# Patient Record
Sex: Male | Born: 1960 | Race: Black or African American | Hispanic: No | Marital: Single | Smoking: Former smoker
Health system: Southern US, Community
[De-identification: ages and names within clinical notes are randomized; demographics above are authoritative.]

## PROBLEM LIST (undated history)

## (undated) DIAGNOSIS — C61 Malignant neoplasm of prostate: Secondary | ICD-10-CM

## (undated) DIAGNOSIS — Z8659 Personal history of other mental and behavioral disorders: Secondary | ICD-10-CM

## (undated) DIAGNOSIS — F32A Depression, unspecified: Secondary | ICD-10-CM

## (undated) DIAGNOSIS — M1990 Unspecified osteoarthritis, unspecified site: Secondary | ICD-10-CM

## (undated) DIAGNOSIS — R351 Nocturia: Secondary | ICD-10-CM

## (undated) DIAGNOSIS — J9600 Acute respiratory failure, unspecified whether with hypoxia or hypercapnia: Secondary | ICD-10-CM

## (undated) DIAGNOSIS — K029 Dental caries, unspecified: Secondary | ICD-10-CM

## (undated) DIAGNOSIS — F329 Major depressive disorder, single episode, unspecified: Secondary | ICD-10-CM

## (undated) DIAGNOSIS — Z87820 Personal history of traumatic brain injury: Secondary | ICD-10-CM

## (undated) DIAGNOSIS — Z9189 Other specified personal risk factors, not elsewhere classified: Secondary | ICD-10-CM

## (undated) DIAGNOSIS — I10 Essential (primary) hypertension: Secondary | ICD-10-CM

## (undated) DIAGNOSIS — N179 Acute kidney failure, unspecified: Secondary | ICD-10-CM

## (undated) DIAGNOSIS — Z973 Presence of spectacles and contact lenses: Secondary | ICD-10-CM

## (undated) DIAGNOSIS — R972 Elevated prostate specific antigen [PSA]: Secondary | ICD-10-CM

## (undated) DIAGNOSIS — N400 Enlarged prostate without lower urinary tract symptoms: Secondary | ICD-10-CM

## (undated) HISTORY — DX: Acute kidney failure, unspecified: N17.9

## (undated) HISTORY — PX: UPPER GASTROINTESTINAL ENDOSCOPY: SHX188

## (undated) HISTORY — PX: APPENDECTOMY: SHX54

## (undated) HISTORY — DX: Acute respiratory failure, unspecified whether with hypoxia or hypercapnia: J96.00

---
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
## 1999-03-02 ENCOUNTER — Emergency Department (HOSPITAL_COMMUNITY): Admission: EM | Admit: 1999-03-02 | Discharge: 1999-03-02 | Payer: Self-pay | Admitting: Emergency Medicine

## 1999-12-14 ENCOUNTER — Emergency Department (HOSPITAL_COMMUNITY): Admission: EM | Admit: 1999-12-14 | Discharge: 1999-12-14 | Payer: Self-pay | Admitting: Emergency Medicine

## 1999-12-14 ENCOUNTER — Encounter: Payer: Self-pay | Admitting: Emergency Medicine

## 2002-02-14 ENCOUNTER — Emergency Department (HOSPITAL_COMMUNITY): Admission: EM | Admit: 2002-02-14 | Discharge: 2002-02-14 | Payer: Self-pay | Admitting: Emergency Medicine

## 2002-02-14 ENCOUNTER — Encounter: Payer: Self-pay | Admitting: Emergency Medicine

## 2006-02-19 ENCOUNTER — Emergency Department (HOSPITAL_COMMUNITY): Admission: EM | Admit: 2006-02-19 | Discharge: 2006-02-19 | Payer: Self-pay | Admitting: Emergency Medicine

## 2008-12-28 ENCOUNTER — Emergency Department (HOSPITAL_COMMUNITY): Admission: EM | Admit: 2008-12-28 | Discharge: 2008-12-28 | Payer: Self-pay | Admitting: Emergency Medicine

## 2009-02-12 ENCOUNTER — Ambulatory Visit: Payer: Self-pay | Admitting: Physician Assistant

## 2009-02-12 DIAGNOSIS — F17200 Nicotine dependence, unspecified, uncomplicated: Secondary | ICD-10-CM | POA: Insufficient documentation

## 2009-02-12 DIAGNOSIS — M545 Low back pain: Secondary | ICD-10-CM

## 2009-02-12 DIAGNOSIS — F1010 Alcohol abuse, uncomplicated: Secondary | ICD-10-CM | POA: Insufficient documentation

## 2009-02-12 DIAGNOSIS — R82998 Other abnormal findings in urine: Secondary | ICD-10-CM | POA: Insufficient documentation

## 2009-02-12 DIAGNOSIS — M1990 Unspecified osteoarthritis, unspecified site: Secondary | ICD-10-CM | POA: Insufficient documentation

## 2009-02-12 DIAGNOSIS — I10 Essential (primary) hypertension: Secondary | ICD-10-CM | POA: Insufficient documentation

## 2009-02-12 LAB — CONVERTED CEMR LAB
Bilirubin Urine: NEGATIVE
Glucose, Urine, Semiquant: NEGATIVE
Ketones, urine, test strip: NEGATIVE
Nitrite: NEGATIVE
Specific Gravity, Urine: 1.025
Urobilinogen, UA: 0.2

## 2009-02-13 ENCOUNTER — Encounter: Payer: Self-pay | Admitting: Physician Assistant

## 2009-02-15 ENCOUNTER — Ambulatory Visit (HOSPITAL_COMMUNITY): Admission: RE | Admit: 2009-02-15 | Discharge: 2009-02-15 | Payer: Self-pay | Admitting: Physician Assistant

## 2009-02-15 ENCOUNTER — Encounter: Payer: Self-pay | Admitting: Physician Assistant

## 2009-02-15 DIAGNOSIS — F1410 Cocaine abuse, uncomplicated: Secondary | ICD-10-CM | POA: Insufficient documentation

## 2009-02-15 LAB — CONVERTED CEMR LAB
ALT: 24 units/L (ref 0–53)
Alkaline Phosphatase: 37 units/L — ABNORMAL LOW (ref 39–117)
Amphetamine Screen, Ur: NEGATIVE
BUN: 11 mg/dL (ref 6–23)
Bacteria, UA: NONE SEEN
Basophils Absolute: 0 10*3/uL (ref 0.0–0.1)
Benzodiazepines.: NEGATIVE
Calcium: 10.3 mg/dL (ref 8.4–10.5)
Casts: NONE SEEN /lpf
Cocaine Metabolites: POSITIVE — AB
Creatinine, Ser: 1.19 mg/dL (ref 0.40–1.50)
Creatinine,U: 204 mg/dL
Glucose, Bld: 84 mg/dL (ref 70–99)
Methadone: NEGATIVE
Monocytes Absolute: 0.7 10*3/uL (ref 0.1–1.0)
Opiate Screen, Urine: NEGATIVE
Phencyclidine (PCP): NEGATIVE
Platelets: 224 10*3/uL (ref 150–400)
RBC / HPF: NONE SEEN (ref <3)
Sodium: 144 meq/L (ref 135–145)
Total Bilirubin: 0.6 mg/dL (ref 0.3–1.2)
Total Protein: 7.6 g/dL (ref 6.0–8.3)
WBC, UA: NONE SEEN cells/hpf (ref <3)
WBC: 5.4 10*3/uL (ref 4.0–10.5)

## 2009-02-26 ENCOUNTER — Encounter: Payer: Self-pay | Admitting: Physician Assistant

## 2009-03-01 ENCOUNTER — Ambulatory Visit: Payer: Self-pay | Admitting: Physician Assistant

## 2009-03-09 ENCOUNTER — Encounter: Payer: Self-pay | Admitting: Physician Assistant

## 2009-03-09 ENCOUNTER — Encounter: Admission: RE | Admit: 2009-03-09 | Discharge: 2009-06-07 | Payer: Self-pay | Admitting: Physician Assistant

## 2009-03-14 ENCOUNTER — Encounter: Payer: Self-pay | Admitting: Physician Assistant

## 2009-03-14 ENCOUNTER — Telehealth: Payer: Self-pay | Admitting: Physician Assistant

## 2009-03-24 ENCOUNTER — Ambulatory Visit: Payer: Self-pay | Admitting: Physician Assistant

## 2009-03-24 DIAGNOSIS — F329 Major depressive disorder, single episode, unspecified: Secondary | ICD-10-CM | POA: Insufficient documentation

## 2009-03-24 DIAGNOSIS — F3289 Other specified depressive episodes: Secondary | ICD-10-CM | POA: Insufficient documentation

## 2009-03-24 LAB — CONVERTED CEMR LAB
BUN: 15 mg/dL (ref 6–23)
CO2: 26 meq/L (ref 19–32)
Potassium: 5.1 meq/L (ref 3.5–5.3)
Sodium: 141 meq/L (ref 135–145)

## 2009-03-25 ENCOUNTER — Encounter: Payer: Self-pay | Admitting: Physician Assistant

## 2009-04-08 ENCOUNTER — Encounter: Payer: Self-pay | Admitting: Physician Assistant

## 2009-04-16 ENCOUNTER — Telehealth: Payer: Self-pay | Admitting: Physician Assistant

## 2009-04-16 ENCOUNTER — Ambulatory Visit: Payer: Self-pay | Admitting: Physician Assistant

## 2009-04-19 ENCOUNTER — Encounter: Payer: Self-pay | Admitting: Physician Assistant

## 2009-04-19 DIAGNOSIS — E781 Pure hyperglyceridemia: Secondary | ICD-10-CM | POA: Insufficient documentation

## 2009-04-19 LAB — CONVERTED CEMR LAB
BUN: 13 mg/dL (ref 6–23)
HDL goal, serum: 40 mg/dL
LDL Cholesterol: 44 mg/dL (ref 0–99)
Sodium: 138 meq/L (ref 135–145)
Total CHOL/HDL Ratio: 3.9
Triglycerides: 335 mg/dL — ABNORMAL HIGH (ref <150)

## 2009-04-29 ENCOUNTER — Ambulatory Visit: Payer: Self-pay | Admitting: Physician Assistant

## 2009-05-04 ENCOUNTER — Encounter: Payer: Self-pay | Admitting: Physician Assistant

## 2009-05-21 ENCOUNTER — Telehealth: Payer: Self-pay | Admitting: Physician Assistant

## 2009-05-21 ENCOUNTER — Ambulatory Visit: Payer: Self-pay | Admitting: Physician Assistant

## 2009-05-21 ENCOUNTER — Encounter: Payer: Self-pay | Admitting: Physician Assistant

## 2009-05-21 DIAGNOSIS — K047 Periapical abscess without sinus: Secondary | ICD-10-CM

## 2009-05-24 ENCOUNTER — Encounter: Payer: Self-pay | Admitting: Physician Assistant

## 2009-05-26 ENCOUNTER — Ambulatory Visit (HOSPITAL_COMMUNITY): Admission: RE | Admit: 2009-05-26 | Discharge: 2009-05-26 | Payer: Self-pay | Admitting: Internal Medicine

## 2009-06-01 ENCOUNTER — Telehealth (INDEPENDENT_AMBULATORY_CARE_PROVIDER_SITE_OTHER): Payer: Self-pay | Admitting: *Deleted

## 2009-06-09 ENCOUNTER — Encounter: Admission: RE | Admit: 2009-06-09 | Discharge: 2009-06-09 | Payer: Self-pay | Admitting: Internal Medicine

## 2009-07-02 ENCOUNTER — Encounter: Payer: Self-pay | Admitting: Physician Assistant

## 2009-07-27 ENCOUNTER — Ambulatory Visit: Payer: Self-pay | Admitting: Physician Assistant

## 2009-08-17 ENCOUNTER — Ambulatory Visit: Payer: Self-pay | Admitting: Physician Assistant

## 2009-08-17 LAB — CONVERTED CEMR LAB
BUN: 13 mg/dL (ref 6–23)
Chloride: 104 meq/L (ref 96–112)

## 2009-08-20 ENCOUNTER — Encounter: Payer: Self-pay | Admitting: Physician Assistant

## 2009-09-06 ENCOUNTER — Telehealth: Payer: Self-pay | Admitting: Physician Assistant

## 2009-09-07 ENCOUNTER — Ambulatory Visit: Payer: Self-pay | Admitting: Physician Assistant

## 2009-09-07 DIAGNOSIS — L723 Sebaceous cyst: Secondary | ICD-10-CM

## 2009-09-07 DIAGNOSIS — R599 Enlarged lymph nodes, unspecified: Secondary | ICD-10-CM | POA: Insufficient documentation

## 2009-09-09 ENCOUNTER — Encounter: Payer: Self-pay | Admitting: Physician Assistant

## 2009-09-14 ENCOUNTER — Encounter: Admission: RE | Admit: 2009-09-14 | Discharge: 2009-09-14 | Payer: Self-pay | Admitting: Internal Medicine

## 2009-09-14 ENCOUNTER — Encounter: Payer: Self-pay | Admitting: Physician Assistant

## 2009-09-17 ENCOUNTER — Encounter: Payer: Self-pay | Admitting: Physician Assistant

## 2009-09-21 ENCOUNTER — Telehealth: Payer: Self-pay | Admitting: Physician Assistant

## 2009-09-21 ENCOUNTER — Ambulatory Visit: Payer: Self-pay | Admitting: Physician Assistant

## 2009-09-21 LAB — CONVERTED CEMR LAB
ALT: 32 units/L (ref 0–53)
Albumin: 4.7 g/dL (ref 3.5–5.2)
Alkaline Phosphatase: 32 units/L — ABNORMAL LOW (ref 39–117)
BUN: 12 mg/dL (ref 6–23)
Calcium: 10.2 mg/dL (ref 8.4–10.5)
Creatinine, Ser: 1.1 mg/dL (ref 0.40–1.50)
Indirect Bilirubin: 0.7 mg/dL (ref 0.0–0.9)
LDL Cholesterol: 112 mg/dL — ABNORMAL HIGH (ref 0–99)
Potassium: 5.3 meq/L (ref 3.5–5.3)
Total CHOL/HDL Ratio: 3.9
Total Protein: 7.7 g/dL (ref 6.0–8.3)
Triglycerides: 139 mg/dL (ref <150)

## 2009-09-22 ENCOUNTER — Encounter: Payer: Self-pay | Admitting: Physician Assistant

## 2009-11-05 ENCOUNTER — Encounter: Payer: Self-pay | Admitting: Physician Assistant

## 2010-01-11 ENCOUNTER — Ambulatory Visit: Admission: RE | Admit: 2010-01-11 | Payer: Self-pay | Source: Home / Self Care | Admitting: Physician Assistant

## 2010-01-11 ENCOUNTER — Encounter (INDEPENDENT_AMBULATORY_CARE_PROVIDER_SITE_OTHER): Payer: Self-pay | Admitting: Nurse Practitioner

## 2010-01-12 ENCOUNTER — Emergency Department (HOSPITAL_COMMUNITY)
Admission: EM | Admit: 2010-01-12 | Discharge: 2010-01-12 | Payer: Self-pay | Source: Home / Self Care | Admitting: Emergency Medicine

## 2010-01-28 ENCOUNTER — Encounter (INDEPENDENT_AMBULATORY_CARE_PROVIDER_SITE_OTHER): Payer: Self-pay | Admitting: Nurse Practitioner

## 2010-01-29 ENCOUNTER — Encounter: Payer: Self-pay | Admitting: Internal Medicine

## 2010-02-10 NOTE — Progress Notes (Signed)
Summary: Dental Referral  Phone Note Outgoing Call   Summary of Call: Needs dental clinic referral.  On antibxs and pain meds. Initial call taken by: Tereso Newcomer PA-C,  May 21, 2009 11:05 AM

## 2010-02-10 NOTE — Letter (Signed)
Summary: REFERAL//PSYCHOLOGY //APPT DATE & TIME  REFERAL//PSYCHOLOGY //APPT DATE & TIME   Imported By: Arta Bruce 05/26/2009 14:58:24  _____________________________________________________________________  External Attachment:    Type:   Image     Comment:   External Document

## 2010-02-10 NOTE — Assessment & Plan Note (Signed)
Summary: 6 WEEK FU ON BACK PAIN AND KNEE/////KT   Vital Signs:  Patient profile:   50 year old male Weight:      179.8 pounds BMI:     26.46 BSA:     1.98 Temp:     98.0 degrees F oral Pulse rate:   60 / minute Pulse rhythm:   regular Resp:     16 per minute BP sitting:   119 / 76  (left arm) Cuff size:   large  Vitals Entered By: Levon Hedger (May 21, 2009 9:44 AM) CC: follow-up visit for knee and back Is Patient Diabetic? No Pain Assessment Patient in pain? yes     Location: back Intensity: 5-6 Onset of pain  Intermittent  Does patient need assistance? Functional Status Self care Ambulation Normal   Primary Care Provider:  Tereso Newcomer PA-C  CC:  follow-up visit for knee and back.  History of Present Illness: Here for f/u on back and knee.  Needs letter to help with child support.  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  Does not know how to do anything.  Started back to working at [employer], etc.  No injury.  Pain in back worsened at that time.  Has been worse since.  Cannot find employment now.  Back pain:  Lumbar area.  Lifting and standing makes worse.  Changes in weather makes worse.  Sitting up straight helps.  Went through PT.  Had some improvement.  Still doing HEP.  Feels pain and numbness into his buttock on the left.  Naproxen helps.  Out of Tramadol now.  Had Rx from prior doctor for hydrocodone.  Got it filled recently for tooth pain.  Dental pain:  Started several weeks ago.  Has been taking hydrocodone.  No fevers.  Has had facial swelling.    Drug abuse:  Getting into drug tx program.  Spoke to A. Vaughan this am.  He states he has stopped using cocaine.  Reducing his alcohol intake.  Still smoking THC.  Has signed forms to get records released to treatment program.  Depression:  States he had thoughts of suicide several weeks ago.  Feels better since he got into tx for drugs.  States AAnanias Pilgrim  wanted to talk with me.  No suicidal plans at this time.   Allergies: 1)  ! Asa  Physical Exam  General:  alert, well-developed, and well-nourished.   Head:  normocephalic and atraumatic.   Neck:  supple.   Lungs:  normal breath sounds.   Heart:  normal rate and regular rhythm.   Msk:  neg SLR no spinal tend to palp  Neurologic:  alert & oriented X3 and cranial nerves II-XII intact.   Psych:  normally interactive and not suicidal.     Impression & Recommendations:  Problem # 1:  ABSCESS, TOOTH (ICD-522.5) start antibxs refer to dental clinic  Orders: Dental Referral (Dentist)  Problem # 2:  BACK PAIN, LUMBAR (ICD-724.2)  get MRI considered prednisone taper but with depression will hold off on this due to SEs change to diclofenac will stop tramadol in anticipation of using SSRI will need to do letter for him for child support   His updated medication list for this problem includes:    Naprosyn 500 Mg Tabs (Naproxen) .Marland Kitchen... Take 1 tablet by mouth two times a day as needed for pain    Tramadol Hcl 50 Mg Tabs (Tramadol hcl) .Marland Kitchen... Take 1  by mouth up three times a day as needed for pain    Hydrocodone-acetaminophen 5-500 Mg Tabs (Hydrocodone-acetaminophen) .Marland Kitchen... Take one and a half tablet by mouth twice daily as needed for pain *sami hassan,md    Diclofenac Sodium 75 Mg Tbec (Diclofenac sodium) .Marland Kitchen... Take 1 tablet by mouth two times a day as needed for pain  Orders: MRI (MRI)  Problem # 3:  DEPRESSION (ICD-311)  will d/w LCSW will try to start on SSRI  d/w A. Vaughan I have decided to try him on Celexa will arrange close f/u  His updated medication list for this problem includes:    Celexa 10 Mg Tabs (Citalopram hydrobromide) .Marland Kitchen... Take 1 tablet by mouth once a day  Problem # 4:  OSTEOARTHRITIS (ICD-715.90) knee pain likely 2/2 back pain will focus on back first  His updated medication list for this problem includes:    Naprosyn 500 Mg Tabs (Naproxen) .Marland Kitchen...  Take 1 tablet by mouth two times a day as needed for pain    Tramadol Hcl 50 Mg Tabs (Tramadol hcl) .Marland Kitchen... Take 1 by mouth up three times a day as needed for pain    Hydrocodone-acetaminophen 5-500 Mg Tabs (Hydrocodone-acetaminophen) .Marland Kitchen... Take one and a half tablet by mouth twice daily as needed for pain *sami hassan,md    Diclofenac Sodium 75 Mg Tbec (Diclofenac sodium) .Marland Kitchen... Take 1 tablet by mouth two times a day as needed for pain  Problem # 5:  COCAINE ABUSE (ICD-305.60) now in tx program  Complete Medication List: 1)  Lisinopril-hydrochlorothiazide 20-12.5 Mg Tabs (Lisinopril-hydrochlorothiazide) .... Take 1 tablet by mouth once a day for blood pressure 2)  Naprosyn 500 Mg Tabs (Naproxen) .... Take 1 tablet by mouth two times a day as needed for pain 3)  Tramadol Hcl 50 Mg Tabs (Tramadol hcl) .... Take 1 by mouth up three times a day as needed for pain 4)  Fish Oil 1000 Mg Caps (Omega-3 fatty acids) .... Take 2 by mouth once daily 5)  Hydrocodone-acetaminophen 5-500 Mg Tabs (Hydrocodone-acetaminophen) .... Take one and a half tablet by mouth twice daily as needed for pain *sami hassan,md 6)  Amoxicillin 500 Mg Caps (Amoxicillin) .... Take 1 tablet by mouth three times a day 7)  Diclofenac Sodium 75 Mg Tbec (Diclofenac sodium) .... Take 1 tablet by mouth two times a day as needed for pain 8)  Celexa 10 Mg Tabs (Citalopram hydrobromide) .... Take 1 tablet by mouth once a day  Patient Instructions: 1)  I will discuss with Marchelle Folks. 2)  Please schedule a follow-up appointment in 3 weeks with Scott.  Prescriptions: CELEXA 10 MG TABS (CITALOPRAM HYDROBROMIDE) Take 1 tablet by mouth once a day  #30 x 2   Entered and Authorized by:   Tereso Newcomer PA-C   Signed by:   Tereso Newcomer PA-C on 05/21/2009   Method used:   Printed then faxed to ...         RxID:   [id number] DICLOFENAC SODIUM 75 MG TBEC (DICLOFENAC SODIUM) Take 1 tablet by mouth two times a day as needed for pain  #60 x 2    Entered and Authorized by:   Tereso Newcomer PA-C   Signed by:   Tereso Newcomer PA-C on 05/21/2009   Method used:   Print then Give to Patient   RxID:   [id number] AMOXICILLIN 500 MG CAPS (AMOXICILLIN) Take 1 tablet by mouth three times a day  #21 x 0   Entered and Authorized by:  Tereso Newcomer PA-C   Signed by:   Tereso Newcomer PA-C on 05/21/2009   Method used:   Print then Give to Patient   RxID:   [id number]

## 2010-02-10 NOTE — Letter (Signed)
Summary: MAILED REQUESTED RECORDS TO [facility]  MAILED REQUESTED RECORDS TO [facility]   Imported By: Arta Bruce 09/09/2009 12:40:07  _____________________________________________________________________  External Attachment:    Type:   Image     Comment:   External Document

## 2010-02-10 NOTE — Assessment & Plan Note (Signed)
Summary: re-examine neck, BP check//mm   Vital Signs:  Patient profile:   50 year old male Height:      69.25 inches Weight:      175 pounds BMI:     25.75 Temp:     97.7 degrees F oral Pulse rate:   66 / minute Pulse rhythm:   regular Resp:     18 per minute BP sitting:   151 / 92  (left arm) Cuff size:   large  Vitals Entered By: CMA Student Kenyatta CC: office visit, BP check and to examine neck, patient medications are misplaced, naprosyn/lisinopril/amoxicillin, currrent back and knee pain Is Patient Diabetic? No Pain Assessment Patient in pain? yes     Location: right knee/ back Intensity: 5 Type: aching/burning  Does patient need assistance? Functional Status Self care Ambulation Normal   Primary Care Delylah Stanczyk:  Tereso Newcomer PA-C  CC:  office visit, BP check and to examine neck, patient medications are misplaced, naprosyn/lisinopril/amoxicillin, and currrent back and knee pain.  History of Present Illness: Here for f/u. Lost meds.  No BP meds in 1 week. No headaches, sob, syncope.  No PND or orthopnea or edema.  Had some fleeting chest pain x 1 couple days ago.  Sharp and left sided.  NO exertional symptoms.  Has not happened since.  Drank cold water with relief. Still c/o back pain.  Never went back to get #2 and #3 ESI.  I asked him to call and schedule an appt last visit. Finished antibx's.  Teeth no longer bothering him.  Missed appt at dental clinic.  They said they would never see him again.    Current Medications (verified): 1)  Lisinopril-Hydrochlorothiazide 20-12.5 Mg Tabs (Lisinopril-Hydrochlorothiazide) .... Take 1 Tablet By Mouth Once A Day For Blood Pressure 2)  Naprosyn 500 Mg Tabs (Naproxen) .... Take 1 Tablet By Mouth Two Times A Day As Needed For Pain 3)  Fish Oil 1000 Mg Caps (Omega-3 Fatty Acids) .... Take 2 By Mouth Once Daily 4)  Celexa 20 Mg Tabs (Citalopram Hydrobromide) .... Take 1 Tablet By Mouth Once A Day  Allergies (verified): 1)  !  Asa  Physical Exam  General:  alert, well-developed, and well-nourished.   Head:  normocephalic and atraumatic.   Eyes:  pupils equal, pupils round, and pupils reactive to light.   Neck:  supple and no cervical lymphadenopathy.   Lungs:  normal breath sounds.   Heart:  normal rate and regular rhythm.   Abdomen:  soft and no hepatomegaly.   Neurologic:  alert & oriented X3 and cranial nerves II-XII intact.   Skin:  2 mm epidermal cyst at level of right clavicle medially Psych:  normally interactive.     Impression & Recommendations:  Problem # 1:  HYPERTENSION (ICD-401.9) out of meds for a week thinks they were thrown out by mistake refill and check BMET and BP in 2 weeks   His updated medication list for this problem includes:    Lisinopril-hydrochlorothiazide 20-12.5 Mg Tabs (Lisinopril-hydrochlorothiazide) .Marland Kitchen... Take 1 tablet by mouth once a day for blood pressure  Problem # 2:  EPIDERMOID CYST (ICD-706.2) d/w Dr. Delrae Alfred who saw pat. no need to remove now if bothersome can warned pat he could have worse scar with removal  Problem # 3:  BACK PAIN, LUMBAR (ICD-724.2) needs to call for f/u on ESI  His updated medication list for this problem includes:    Naprosyn 500 Mg Tabs (Naproxen) .Marland Kitchen... Take 1 tablet by mouth  two times a day as needed for pain  Problem # 4:  LYMPHADENOPATHY (ICD-785.6) Assessment: Improved resolved  Problem # 5:  DEPRESSION (ICD-311) f/u with psych  His updated medication list for this problem includes:    Celexa 20 Mg Tabs (Citalopram hydrobromide) .Marland Kitchen... Take 1 tablet by mouth once a day  Problem # 6:  HYPERTRIGLYCERIDEMIA (ICD-272.1) arrange f/u labs  Complete Medication List: 1)  Lisinopril-hydrochlorothiazide 20-12.5 Mg Tabs (Lisinopril-hydrochlorothiazide) .... Take 1 tablet by mouth once a day for blood pressure 2)  Naprosyn 500 Mg Tabs (Naproxen) .... Take 1 tablet by mouth two times a day as needed for pain 3)  Fish Oil 1000 Mg  Caps (Omega-3 fatty acids) .... Take 2 by mouth once daily 4)  Celexa 20 Mg Tabs (Citalopram hydrobromide) .... Take 1 tablet by mouth once a day  Patient Instructions: 1)  Arna Medici:  Please check with dental clinic.  Gwen Pounds missed appt and was told he could not be seen.  Was treated with antibiotics last visit. 2)  Schedule FLP and LFT's in 2 weeks. 3)  BP check and BMET in 2 weeks with nurse.  Notify Lianna Sitzmann if BP > 140/90. 4)  Call [phone] to schedule your back injections. 5)  Please schedule a follow-up appointment in 2 months with Scott for blood pressure. 6)    Prescriptions: NAPROSYN 500 MG TABS (NAPROXEN) Take 1 tablet by mouth two times a day as needed for pain  #60 x 1   Entered and Authorized by:   Tereso Newcomer PA-C   Signed by:   Tereso Newcomer PA-C on 09/07/2009   Method used:   Print then Give to Patient   RxID:   [id number] LISINOPRIL-HYDROCHLOROTHIAZIDE 20-12.5 MG TABS (LISINOPRIL-HYDROCHLOROTHIAZIDE) Take 1 tablet by mouth once a day for blood pressure  #30 x 5   Entered and Authorized by:   Tereso Newcomer PA-C   Signed by:   Tereso Newcomer PA-C on 09/07/2009   Method used:   Print then Give to Patient   RxID:   [id number]

## 2010-02-10 NOTE — Letter (Signed)
Summary: [facility] REQUESTED RECORDS TO [facility]  [facility] REQUESTED RECORDS TO [facility]   Imported By: Arta Bruce 11/05/2009 10:09:23  _____________________________________________________________________  External Attachment:    Type:   Image     Comment:   External Document

## 2010-02-10 NOTE — Progress Notes (Signed)
  Phone Note Call from Patient   Summary of Call: pt says he needs a letter stating that he is unable to work and his condition and how long he has been under your care and contiune being under your care and being unable to work. pt will pick it up when its done Initial call taken by: [name],  April 16, 2009 8:41 AM  Follow-up for Phone Call        I need to know why he needs this letter. I have not really done a complete assessment of his back and knees.  I just started seeing him in Feb. At this point I don't think I can write a letter that would help him for anything like disability. He needs further evaluation and further testing (if indicated).  Follow-up by: Tereso Newcomer PA-C,  May 14, 2009 1:54 PM  Additional Follow-up for Phone Call Additional follow up Details #1::        Left message on answering machine for pt to call back.Marland KitchenMarland KitchenArmenia Shannon  May 17, 2009 12:44 PM   pt says he needs this letter for child support... pt says he got behind on his child support payment and now he is unable to work so he wanted a letter stating that he is unable to do certain kinds of jobs... pt says this should keep him from going to jail, if the court know that he is unable to work.. [name]  May 17, 2009 3:57 PM     Additional Follow-up for Phone Call Additional follow up Details #2::    done Follow-up by: Brynda Rim,  May 21, 2009 3:18 PM   Appended Document: letter pickup pt. notified letter available he will come by to pick up Gaylyn Cheers RN 05/24/09   Clinical Lists Changes

## 2010-02-10 NOTE — Miscellaneous (Signed)
Summary: Rehab Report//INITIAL SUMMARY  Rehab Report//INITIAL SUMMARY   Imported By: Arta Bruce 05/14/2009 12:50:32  _____________________________________________________________________  External Attachment:    Type:   Image     Comment:   External Document

## 2010-02-10 NOTE — Miscellaneous (Signed)
Summary: Records from [facility] reviewed   Records reviewed from [facility] from 1989. Patient admitted with nystagmus, facial weakness and diplopia. CT scan with Left Pontine Lesion. Tx to [facility]. Records requested.  Clinical Lists Changes  Observations: Added new observation of PAST MED HX: Osteoarthritis   a.  h/o right knee arthritis h/o fall/accident 1995 (?)   a.  [place], [place] - seen in hosp there   b.  states his back was "broken"   c.  no surgery Hypertension   a.  never been on med in past h/o "abnormal blood vessel" in head; treated at [facility]. early 1990s   a.  admx to [facility] in 1989 with diplopia, nystagmus and facial weakness   b.  Left Pontine Lesion noted   c.  Patient transferred to [facility]    (03/14/2009 15:51)       Past History:  Past Medical History: Osteoarthritis   a.  h/o right knee arthritis h/o fall/accident 1995 (?)   a.  [place], [place] - seen in hosp there   b.  states his back was "broken"   c.  no surgery Hypertension   a.  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 early 1990s   a.  admx to [facility] in 1989 with diplopia, nystagmus and facial weakness   b.  Left Pontine Lesion noted   c.  Patient transferred to [facility]

## 2010-02-10 NOTE — Assessment & Plan Note (Signed)
Summary: new pt/ first est care//gk   Vital Signs:  Patient profile:   50 year old male Height:      69.25 inches Weight:      180 pounds BMI:     26.49 Temp:     98.4 degrees F oral Pulse rate:   78 / minute Pulse rhythm:   regular Resp:     18 per minute BP sitting:   134 / 84  (left arm) Cuff size:   regular  Vitals Entered By: Raymond [name] (February 12, 2009 10:07 AM)  Serial Vital Signs/Assessments:  Time      Position  BP       Pulse  Resp  Temp     By 11:16 AM            148/100                        Raymond Newcomer PA-C 11:16 AM            150/96                         Raymond Newcomer PA-C  Comments: 11:16 AM left arm By: Raymond Newcomer PA-C  11:16 AM right arm  By: Raymond Newcomer PA-C   CC: NP...Marland Kitchen pt says need meds for bp..., Hypertension Management Is Patient Diabetic? No Pain Assessment Patient in pain? no       Does patient need assistance? Functional Status Self care Ambulation Normal   CC:  NP...Marland Kitchen pt says need meds for bp... and Hypertension Management.  History of Present Illness: New patient. Previously going to [facility] [place] [facility].  Was told his BP was too high.  Was given meds, but never got filled.    Has a h/o low back injury 20 years ago.  Says he broke his back and spent several days in the hospital.  He did not have surgery.  His back pain flares up from time to time.  No radicular symptoms.  He has a h/o right knee arthritis.  He feels like this makes his back worse.  He has had a flare of pain since early Jan.  He did have an xray in Dec.  It showed mild DJD.  It is stiff.  He walks in with a limp.  He has used tramadol in the past with relief.  He has run out of this.  Hypertension History:      repeat bp much higher will start meds .        Positive major cardiovascular risk factors include male age 9 years old or older, hypertension, and current tobacco user.     Habits & Providers  Alcohol-Tobacco-Diet  Alcohol drinks/day: 4     Alcohol type: beer     Tobacco Status: current     Cigarette Packs/Day: 0.5     Year Started: 1978  Exercise-Depression-Behavior     Drug Use: yes     Seat Belt Use: always     Sun Exposure: infrequent  Allergies (verified): 1)  ! Jonne Ply  Past History:  Past Medical History: Osteoarthritis   a.  h/o right knee arthritis h/o fall/accident 1995 (?)   a.  [place], [place] - seen in hosp there   b.  states his back was "broken"   c.  no surgery Hypertension   a.  never been on med in past h/o "abnormal blood vessel" in  head; treated at [facility]. early 1990s  Past Surgical History: Appendectomy Hemorrhoidectomy  Family History: Family History Hypertension DM - Grandparents  Social History: Occupation: not working; used to work in [employer] Single 3 kids Current Smoker Alcohol use-yes Drug use-yes (previous THC) Smoking Status:  current Packs/Day:  0.5 Seat Belt Use:  always Sun Exposure-Excessive:  infrequent Occupation:  employed Drug Use:  yes  Review of Systems  The patient denies fever, chest pain, syncope, dyspnea on exertion, and headaches.         see HPI  Physical Exam  General:  alert, well-developed, and well-nourished.   Head:  normocephalic and atraumatic.   Eyes:  pupils equal, pupils round, pupils reactive to light, and no retinal abnormalitiies.   Ears:  R ear normal and L ear normal.   Mouth:  pharynx pink and moist.   Neck:  supple.   Lungs:  normal breath sounds, no crackles, and no wheezes.   Heart:  normal rate, regular rhythm, and no murmur.   Abdomen:  soft and non-tender.   Msk:  right knee with some crepitus no effusion Ant drawer neg Lachmans neg  no lumbar spinal tenderness with palpation  Extremities:  no edema Neurologic:  alert & oriented X3 and cranial nerves II-XII intact.   Psych:  normally interactive and good eye contact.     Impression & Recommendations:  Problem # 1:  HYPERTENSION  (ICD-401.9)  repeat BP high would go ahead and start med will start lisinopril 20 mg once daily  repeat bmet in 2 weeks  Orders: T-Comprehensive Metabolic Panel (16109-60454) T-CBC w/Diff (09811-91478) T-TSH (29562-13086)  His updated medication list for this problem includes:    Lisinopril 20 Mg Tabs (Lisinopril) .Marland Kitchen... Take 1 tablet by mouth once a day for blood pressure  Problem # 2:  PREVENTIVE HEALTH CARE (ICD-V70.0) flu and Td today check baseline labs eventually schedule CPE  Orders: T-Comprehensive Metabolic Panel (57846-96295) T-CBC w/Diff (28413-24401) T-Drug Screen-Urine, (single) (02725-36644) T-HIV Antibody  (Reflex) (03474-25956) T-TSH (38756-43329) T-Syphilis Test (RPR) (51884-16606) T-Urinalysis (30160-10932)  Problem # 3:  OSTEOARTHRITIS (ICD-715.90)  mild arthritis in right knee by xray rec tylenol . . . NSAIDs . . . tramadol as needed  may benefit from PT not sure injections would be helpful with degree of DJD consider MRI if no improvement  Orders: Physical Therapy Referral (PT)  His updated medication list for this problem includes:    Naprosyn 500 Mg Tabs (Naproxen) .Marland Kitchen... Take 1 tablet by mouth two times a day as needed for pain    Tramadol Hcl 50 Mg Tabs (Tramadol hcl) .Marland Kitchen... Take 1 by mouth up three times a day as needed for pain  Problem # 4:  BACK PAIN, LUMBAR (ICD-724.2)  get xrays  attemp to get records pain control as above will have PT see him for his back too  Orders: Diagnostic X-Ray/Fluoroscopy (Diagnostic X-Ray/Flu) Physical Therapy Referral (PT)  His updated medication list for this problem includes:    Naprosyn 500 Mg Tabs (Naproxen) .Marland Kitchen... Take 1 tablet by mouth two times a day as needed for pain    Tramadol Hcl 50 Mg Tabs (Tramadol hcl) .Marland Kitchen... Take 1 by mouth up three times a day as needed for pain  Problem # 5:  TOBACCO ABUSE (ICD-305.1) d/c cigs  Problem # 6:  ALCOHOL USE (ICD-305.00) will discuss referral to substance  abuse counselor in future appts  Complete Medication List: 1)  Lisinopril 20 Mg Tabs (Lisinopril) .... Take 1 tablet  by mouth once a day for blood pressure 2)  Naprosyn 500 Mg Tabs (Naproxen) .... Take 1 tablet by mouth two times a day as needed for pain 3)  Tramadol Hcl 50 Mg Tabs (Tramadol hcl) .... Take 1 by mouth up three times a day as needed for pain  Other Orders: TD Toxoids IM 7 YR + (04540) Flu Vaccine 61yrs + (98119) Admin 1st Vaccine (14782) Admin of Any Addtl Vaccine (95621) Admin 1st Vaccine [facility]) [phone]) Admin of Any Addtl Vaccine (State) [phone]) T-Culture, Urine (95284-13244) T- * Misc. Laboratory test [phone])  Hypertension Assessment/Plan:      The patient's hypertensive risk group is category B: At least one risk factor (excluding diabetes) with no target organ damage.  Today's blood pressure is 134/84.  His blood pressure goal is < 140/90.  Patient Instructions: 1)  Sign release of information to get records from hospital in [place] for accident when you hurt your back in the 1990s.   2)  Also, need records from [facility] hospital in 1990s when you had "abnormal blood vessel." 3)  Return for blood pressure check in 2 weeks.  Check a BMET and fasting Lipids (401.1, V70.0). 4)  Please schedule a follow-up appointment in 1 month with Tatisha Cerino for blood pressure.  5)  Tobacco is very bad for your health and your loved ones ! You should stop smoking !  6)  Stop smoking tips: Choose a quit date. Cut down before the quit date. Decide what you will do as a substitute when you feel the urge to smoke(gum, toothpick, exercise).  7)  Take 650 - 1000 mg of tylenol every 4-6 hours as needed for relief of pain or comfort of fever. Avoid taking more than 4000 mg in a 24 hour period( can cause liver damage in higher doses).  Prescriptions: TRAMADOL HCL 50 MG TABS (TRAMADOL HCL) Take 1 by mouth up three times a day as needed for pain  #30 x 0   Entered and Authorized by:   Raymond Newcomer  PA-C   Signed by:   Raymond Newcomer PA-C on 02/12/2009   Method used:   Print then Give to Patient   RxID:   [id number] NAPROSYN 500 MG TABS (NAPROXEN) Take 1 tablet by mouth two times a day as needed for pain  #60 x 1   Entered and Authorized by:   Raymond Newcomer PA-C   Signed by:   Raymond Newcomer PA-C on 02/12/2009   Method used:   Print then Give to Patient   RxID:   [id number] LISINOPRIL 20 MG TABS (LISINOPRIL) Take 1 tablet by mouth once a day for blood pressure  #30 x 5   Entered and Authorized by:   Raymond Newcomer PA-C   Signed by:   Raymond Newcomer PA-C on 02/12/2009   Method used:   Print then Give to Patient   RxID:   [id number]   Right Knee:  X-ray Musculoskeletal  Procedure date:  12/28/2008  Findings:       Findings: Mild medial compartment joint space narrowing with   associated hypertrophic spurring.  Exostosis arising from the upper   portion of the medial femoral condyle.  No evidence of acute or   subacute fracture.  Patellofemoral and lateral compartments well-   preserved.  Small joint effusion.  Atherosclerotic calcification in   the popliteal artery and posterior tibial artery.    IMPRESSION:   Mild medial compartment osteoarthritis.  Small joint effusion.   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    Read By:  Arnell Sieving,  M.D.   Released By:  Arnell Sieving,  M.D.  Laboratory Results   Urine Tests    Routine Urinalysis   Glucose: negative   (Normal Range: Negative) Bilirubin: negative   (Normal Range: Negative) Ketone: negative   (Normal Range: Negative) Spec. Gravity: 1.025   (Normal Range: 1.003-1.035) Blood: trace-intact   (Normal Range: Negative) pH: 6.0   (Normal Range: 5.0-8.0) Protein: negative   (Normal Range: Negative) Urobilinogen: 0.2   (Normal Range: 0-1) Nitrite: negative   (Normal Range: Negative) Leukocyte Esterace: negative   (Normal Range: Negative)         Tetanus/Td Vaccine    Vaccine Type: Td    Site: right deltoid    Mfr: Sanofi Pasteur    Dose: 0.5 ml    Route: IM    Given by: Raymond [name]    Exp. Date: 04/16/2011    Lot #: [id number]    VIS given: 11/27/06 version given February 12, 2009.  Influenza Vaccine    Vaccine Type: Fluvax 3+    Site: left deltoid    Mfr: Merck    Dose: 0.5 ml    Route: IM    Given by: Raymond [name]    Exp. Date: 07/08/2009    Lot #: [id number]    VIS given: 08/02/06 version given February 12, 2009.  Flu Vaccine Consent Questions    Do you have a history of severe allergic reactions to this vaccine? no    Any prior history of allergic reactions to egg and/or gelatin? no    Do you have a sensitivity to the preservative Thimersol? no    Do you have a past history of Guillan-Barre Syndrome? no    Do you currently have an acute febrile illness? no    Have you ever had a severe reaction to latex? no    Vaccine information given and explained to patient? yes    Appended Document: new pt/ first est care//gk Patient: Raymond [name] Note: All result statuses are Final unless otherwise noted.  Tests: (1) CBC with Diff (10010)   WBC                       5.4 K/uL                    4.0-10.5   RBC                       5.58 MIL/uL                 4.22-5.81   Hemoglobin                15.4 g/dL                   29.5-62.1   Hematocrit                48.6 %                      39.0-52.0   MCV                       87.1 fL                     78.0-100.0   MCHC  31.7 g/dL                   16.1-09.6   RDW                       14.7 %                      11.5-15.5   Platelet Count            224 K/uL                    150-400   Granulocyte %        [L]  42 %                        43-77   Absolute Gran             2.2 K/uL                    1.7-7.7   Lymph %                   43 %                        12-46   Absolute Lymph            2.3 K/uL                    0.7-4.0   Mono %                [H]  13 %                        3-12   Absolute Mono             0.7 K/uL                    0.1-1.0   Eos %                     2 %                         0-5   Absolute Eos              0.1 K/uL                    0.0-0.7   Baso %                    0 %                         0-1   Absolute Baso             0.0 K/uL                    0.0-0.1   WBC Morphology       RESULT: Criteria for review not met   RBC Morphology       RESULT: Criteria for review not met   Smear Review       RESULT: Criteria for review not met  Tests: (2) Comprehensive Metabolic Panel (04540)   Sodium  144 mEq/L                   135-145   Potassium                 4.5 mEq/L                   3.5-5.3   Chloride                  105 mEq/L                   96-112   CO2                       27 mEq/L                    19-32   Glucose                   84 mg/dL                    16-10   BUN                       11 mg/dL                    9-60   Creatinine                1.19 mg/dL                  0.40-1.50   Bilirubin, Total          0.6 mg/dL                   4.5-4.0   Alkaline Phosphatase [L]  37 U/L                      39-117   AST/SGOT                  19 U/L                      0-37   ALT/SGPT                  24 U/L                      0-53   Total Protein             7.6 g/dL                    9.8-1.1   Albumin                   4.6 g/dL                    9.1-4.7   Calcium                   10.3 mg/dL                  8.2-95.6  Tests: (3) TSH (23280)   TSH                       0.536 uIU/mL                0.350-4.500     ***  Test methodology is 3rd generation TSH***  Tests: (4) RPR Reflex to T.pallidum Ab, Total (16109)   RPR                       NON REAC                    NON REAC  Tests: (5) Drug Screen Urine, No Confirmation (77000)   Benzodiazepines           NEG                         Negative   Phencyclidine             NEG                          Negative   Cocaine Metabolites  [A]  POS                         Negative     Result repeated and verified.     Positive results are confirmed upon request only.  Specimen will be     held for 7 days.   Amphetamines              NEG                         Negative  Marijuana Metabolites                        [A]  POS                         Negative     Result repeated and verified.     Positive results are confirmed upon request only.  Specimen will be     held for 7 days.   Opiates                   NEG                         Negative   Barbiturates              NEG                         Negative   Methadone                 NEG                         Negative   Propoxyphene              NEG                         Negative   Creatinine, Urine         204.0 mg/dL           Cutoff Values for Urine Drug Screen:             Drug Class           Cutoff (ng/mL)             Amphetamines  1000             Barbiturates             200             Cocaine Metabolites      300             Benzodiazepines          200             Methadone                300             Opiates                 2000             Phencyclidine             25             Propoxyphene             300             Marijuana Metabolites     50           For medical purposes only.  Note: An exclamation mark (!) indicates a result that was not dispersed into the flowsheet. Document Creation Date: 02/13/2009 7:27 AM _______________________________________________________________________  (1) Order result status: Final Collection or observation date-time: 02/12/2009 21:40 Requested date-time: 02/12/2009 11:39 Receipt date-time: 02/12/2009 21:40 Reported date-time: 02/13/2009 07:27 Referring Physician:   Ordering Physician:  Alben Spittle [phone]) Specimen Source:  Source: Lajean Silvius Order Number: [id number] Lab site: [facility], [facility]     [address]     [place]   [place]  [address]  (2) Order result status: Final Collection or observation date-time: 02/12/2009 21:40 Requested date-time: 02/12/2009 11:39 Receipt date-time: 02/12/2009 21:40 Reported date-time: 02/13/2009 07:27 Referring Physician:   Ordering Physician:  Alben Spittle [phone]) Specimen Source:  Source: Lajean Silvius Order Number: [id number] Lab site: [facility], [facility]     [address]     [place]  [place]  [address]  (3) Order result status: Final Collection or observation date-time: 02/12/2009 21:40 Requested date-time: 02/12/2009 11:39 Receipt date-time: 02/12/2009 21:40 Reported date-time: 02/13/2009 07:27 Referring Physician:   Ordering Physician:  Alben Spittle [phone]) Specimen Source:  Source: Lajean Silvius Order Number: [id number] Lab site: [facility], [facility]     [address]     [place]  [place]  [address]  (4) Order result status: Final Collection or observation date-time: 02/12/2009 21:40 Requested date-time: 02/12/2009 11:39 Receipt date-time: 02/12/2009 21:40 Reported date-time: 02/13/2009 07:27 Referring Physician:   Ordering Physician:  Alben Spittle [phone]) Specimen Source:  Source: Lajean Silvius Order Number: [id number] Lab site: [facility], [facility]     [address]     [place]  [place]  [address]  (5) Order result status: Final Collection or observation date-time: 02/12/2009 21:40 Requested date-time: 02/12/2009 11:39 Receipt date-time: 02/12/2009 21:40 Reported date-time: 02/13/2009 07:27 Referring Physician:   Ordering Physician:  Alben Spittle [phone]) Specimen Source:  Source: Lajean Silvius Order Number: [id number] Lab site: [facility], [facility]     [address]     [place]  [place]  [address]   Signed by Raymond Newcomer PA-C on 02/15/2009 at 8:36 AM  ________________________________________________________________________ labs ok +cocaine   Clinical Lists  Changes  Problems: Added new problem of COCAINE ABUSE (ICD-305.60)  Assessed COCAINE ABUSE as comment only -  Patient admitted to marijuana at initial visit. Will need to discuss with patient at f/u and encourage him to see substance abuse counselor.        Impression & Recommendations:  Problem # 1:  COCAINE ABUSE (ICD-305.60)  Patient admitted to marijuana at initial visit. Will need to discuss with patient at f/u and encourage him to see substance abuse counselor.  Complete Medication List: 1)  Lisinopril 20 Mg Tabs (Lisinopril) .... Take 1 tablet by mouth once a day for blood pressure 2)  Naprosyn 500 Mg Tabs (Naproxen) .... Take 1 tablet by mouth two times a day as needed for pain 3)  Tramadol Hcl 50 Mg Tabs (Tramadol hcl) .... Take 1 by mouth up three times a day as needed for pain    Signed by Raymond Newcomer PA-C on 02/15/2009 at 8:38 AM  Appended Document: Orders Update need HIV results  will get at lab visit...Marland KitchenMarland KitchenMarland Kitchen Raymond [name]  February 15, 2009 10:03 AM   Clinical Lists Changes

## 2010-02-10 NOTE — Progress Notes (Signed)
Summary: Records from [facility] have been requested from 1989  Phone Note Outgoing Call   Summary of Call: Please request records from [facility] from 1989. 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  Initial call taken by: Tereso Newcomer PA-C,  March 14, 2009 3:54 PM  Follow-up for Phone Call        checked with sheila and she said that the records are requested Follow-up by: [name],  March 25, 2009 9:17 AM

## 2010-02-10 NOTE — Letter (Signed)
Summary: REFAXED REQUESTING RECORDS FROM [facility]   REFAXED REQUESTING RECORDS FROM [facility]   Imported By: Arta Bruce 04/19/2009 16:15:39  _____________________________________________________________________  External Attachment:    Type:   Image     Comment:   External Document

## 2010-02-10 NOTE — Letter (Signed)
Summary: MAILED REQUESTED RECORDS TO [facility]  MAILED REQUESTED RECORDS TO [facility]   Imported By: Arta Bruce 01/28/2010 12:30:18  _____________________________________________________________________  External Attachment:    Type:   Image     Comment:   External Document

## 2010-02-10 NOTE — Letter (Signed)
Summary: RECEIVED RECORDS FROM [facility]  RECEIVED RECORDS FROM [facility]   Imported By: Arta Bruce 05/12/2009 11:38:26  _____________________________________________________________________  External Attachment:    Type:   Image     Comment:   External Document

## 2010-02-10 NOTE — Letter (Signed)
Summary: *HSN Results Follow up  [facility]  [address] [place], [place] [address]   Phone: [phone]  Fax: [phone]      02/15/2009   Raymond [name] [address] [place], [place]  [address]   Dear  Raymond [name],                            ____S.Drinkard,FNP   ____D. Gore,FNP       ____B. McPherson,MD   ____V. Rankins,MD    ____E. Mulberry,MD    ____N. Daphine Deutscher, FNP  ____D. Reche Dixon, MD    ____K. Philipp Deputy, MD    __x__S. Alben Spittle, PA-C     This letter is to inform you that your recent test(s):  _______Pap Smear    ___x____Lab Test     _______X-ray    ____x___ is within acceptable limits  _______ requires a medication change  _______ requires a follow-up lab visit  _______ requires a follow-up visit with your provider   Comments:       _________________________________________________________ If you have any questions, please contact our office                     Sincerely,  Tereso Newcomer PA-C [facility]

## 2010-02-10 NOTE — Assessment & Plan Note (Signed)
Summary: FOLLOW UP IN 1 MONTH WITH Greenley Martone FOR BP //GK   Vital Signs:  Patient profile:   50 year old male Height:      69.25 inches Weight:      183 pounds BMI:     26.93 Temp:     98.9 degrees F oral Pulse rate:   73 / minute Pulse rhythm:   regular Resp:     18 per minute BP sitting:   157 / 96  (left arm) Cuff size:   large  Vitals Entered By: [name] (March 24, 2009 2:21 PM) CC: f/u.... Is Patient Diabetic? No Pain Assessment Patient in pain? no       Does patient need assistance? Functional Status Self care Ambulation Normal   Primary Care Provider:  Tereso Newcomer PA-C  CC:  f/u.....  History of Present Illness: Here for f/u.  Back Pain: Has gradually gotten worse over the years.  Now, hurts to the point where he cannot work.  He is used to doing manual labor.  Right Knee Pain: Chronic problem.  Has gradually gotten worse over the years.  Unable to work as above.  Applying for medicaid.  HTN: Taking meds. No problems.  Smoking:  3-4 cigs per day now.  ETOH:  now drinks a beer 2-3 x per week.  Depression:  Depressed about not being able to work.  Applying for medicaid.  Has a visit with a counselor this month as part of application for medicaid.    Habits & Providers  Exercise-Depression-Behavior     Have you felt down or hopeless? yes     Have you felt little pleasure in things? yes  Current Medications (verified): 1)  Lisinopril 20 Mg Tabs (Lisinopril) .... Take 1 Tablet By Mouth Once A Day For Blood Pressure 2)  Naprosyn 500 Mg Tabs (Naproxen) .... Take 1 Tablet By Mouth Two Times A Day As Needed For Pain 3)  Tramadol Hcl 50 Mg Tabs (Tramadol Hcl) .... Take 1 By Mouth Up Three Times A Day As Needed For Pain  Allergies (verified): 1)  ! Jonne Ply  Past History:  Past Medical History: Last updated: 03/14/2009 Osteoarthritis   a.  h/o right knee arthritis h/o fall/accident 1995 (?)   a.  [place], [place] - seen in hosp there   b.  states his back  was "broken"   c.  no surgery Hypertension   a.  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 early 1990s   a.  admx to [facility] in 1989 with diplopia, nystagmus and facial weakness   b.  Left Pontine Lesion noted   c.  Patient transferred to [facility]   Past Surgical History: Last updated: 02/12/2009 Appendectomy Hemorrhoidectomy  Social History: Reviewed history from 02/12/2009 and no changes required. Occupation: not working; used to work in [employer] Single 3 kids Current Smoker Alcohol use-yes Drug use-yes (previous [facility])  Physical Exam  General:  alert, well-developed, and well-nourished.   Head:  normocephalic and atraumatic.   Neck:  supple and no carotid bruits.   Lungs:  normal breath sounds, no crackles, and no wheezes.   Heart:  normal rate and regular rhythm.   Extremities:  no edema  Neurologic:  alert & oriented X3 and cranial nerves II-XII intact.   Psych:  normally interactive.     Impression & Recommendations:  Problem # 1:  HYPERTENSION (ICD-401.9)  still up  no meds today  change to lisinopril/hct 20/12.5 repeat bmet in 2 weeks  His updated medication list for this problem includes:    Lisinopril-hydrochlorothiazide 20-12.5 Mg Tabs (Lisinopril-hydrochlorothiazide) .Marland Kitchen... Take 1 tablet by mouth once a day for blood pressure  Orders: T-Basic Metabolic Panel ([id number])  Problem # 2:  BACK PAIN, LUMBAR (ICD-724.2) seeing PT had some loss of disc height has pain into left buttocks if no improvement with PT, get MRI   His updated medication list for this problem includes:    Naprosyn 500 Mg Tabs (Naproxen) .Marland Kitchen... Take 1 tablet by mouth two times a day as needed for pain    Tramadol Hcl 50 Mg Tabs (Tramadol hcl) .Marland Kitchen... Take 1 by mouth up three times a day as needed for pain  Problem # 3:  OSTEOARTHRITIS (ICD-715.90) seeing PT if no improvement with PT, consider MRI vs.  injection   His updated medication list for this problem includes:    Naprosyn 500 Mg Tabs (Naproxen) .Marland Kitchen... Take 1 tablet by mouth two times a day as needed for pain    Tramadol Hcl 50 Mg Tabs (Tramadol hcl) .Marland Kitchen... Take 1 by mouth up three times a day as needed for pain  Problem # 4:  COCAINE ABUSE (ICD-305.60) not a regular thing offerred referral to sub abuse counselor but he declines  Problem # 5:  DEPRESSION (ICD-311)  PHQ9=7 today depressed about not working no suicidal ideations willing to see counselor  Orders: Psychology Referral (Psychology)  Problem # 6:  PREVENTIVE HEALTH CARE (ICD-V70.0) requested records from [facility] re: abnormal pontine lesion noted at [facility] in 1980s  Complete Medication List: 1)  Lisinopril-hydrochlorothiazide 20-12.5 Mg Tabs (Lisinopril-hydrochlorothiazide) .... Take 1 tablet by mouth once a day for blood pressure 2)  Naprosyn 500 Mg Tabs (Naproxen) .... Take 1 tablet by mouth two times a day as needed for pain 3)  Tramadol Hcl 50 Mg Tabs (Tramadol hcl) .... Take 1 by mouth up three times a day as needed for pain  Patient Instructions: 1)  Schedule fasting lipids and a bmet in 2 weeks.  Dx V70.0, 401.1. 2)  Please schedule a follow-up appointment in 6 weeks with Tranae Laramie for back and knee. 3)    Prescriptions: LISINOPRIL-HYDROCHLOROTHIAZIDE 20-12.5 MG TABS (LISINOPRIL-HYDROCHLOROTHIAZIDE) Take 1 tablet by mouth once a day for blood pressure  #30 x 5   Entered and Authorized by:   Tereso Newcomer PA-C   Signed by:   Tereso Newcomer PA-C on 03/24/2009   Method used:   Print then Give to Patient   RxID:   [id number]    EKG  Procedure date:  03/24/2009  Findings:      Normal sinus rhythm with rate of:  67 normal axis LVH ? LAE no isch changes  X-ray Musculoskeletal  Procedure date:  02/15/2009  Findings:      Exam Type: Exam Type: L-Spine  Results: Findings: Five views of the lumbar spine submitted.  No acute fracture or  subluxation.  Mild disc space flattening noted at L4-L5 level.  Mild anterior spurring noted upper endplate of the L5, T12 and L1 vertebral body.  Mild atherosclerotic calcifications of the abdominal aorta.  The alignment and vertebral height are preserved.   IMPRESSION: No acute fracture or subluxation.  Mild disc space flattening at L4- L5 level.  Multilevel mild anterior spurring.

## 2010-02-10 NOTE — Letter (Signed)
Summary: NO RECORDS FOUND  NO RECORDS FOUND   Imported By: Arta Bruce 05/24/2009 09:55:57  _____________________________________________________________________  External Attachment:    Type:   Image     Comment:   External Document

## 2010-02-10 NOTE — Miscellaneous (Signed)
Summary: Refer to Drug Treatment   Clinical Lists Changes  Problems: Assessed COCAINE ABUSE as comment only - Approached by LCSW A. Ananias Pilgrim. Patient is basically homeless and cocaine and alcohol abuse is more of a problem than he told me about. AAnanias Pilgrim will try to get him in to a drug treatment program before anything else.        Impression & Recommendations:  Problem # 1:  COCAINE ABUSE (ICD-305.60) Approached by LCSW A. Ananias Pilgrim. Patient is basically homeless and cocaine and alcohol abuse is more of a problem than he told me about. AAnanias Pilgrim will try to get him in to a drug treatment program before anything else.  Complete Medication List: 1)  Lisinopril-hydrochlorothiazide 20-12.5 Mg Tabs (Lisinopril-hydrochlorothiazide) .... Take 1 tablet by mouth once a day for blood pressure 2)  Naprosyn 500 Mg Tabs (Naproxen) .... Take 1 tablet by mouth two times a day as needed for pain 3)  Tramadol Hcl 50 Mg Tabs (Tramadol hcl) .... Take 1 by mouth up three times a day as needed for pain 4)  Fish Oil 1000 Mg Caps (Omega-3 fatty acids) .... Take 2 by mouth once daily

## 2010-02-10 NOTE — Miscellaneous (Signed)
  Clinical Lists Changes  Problems: Assessed BACK PAIN, LUMBAR as comment only -  His updated medication list for this problem includes:    Naprosyn 500 Mg Tabs (Naproxen) .Marland Kitchen... Take 1 tablet by mouth two times a day as needed for pain  Orders: Radiology Referral (Radiology)  Orders: Added new Referral order of Radiology Referral (Radiology) - Signed       Impression & Recommendations:  Problem # 1:  BACK PAIN, LUMBAR (ICD-724.2)  His updated medication list for this problem includes:    Naprosyn 500 Mg Tabs (Naproxen) .Marland Kitchen... Take 1 tablet by mouth two times a day as needed for pain  Orders: Radiology Referral (Radiology)  Complete Medication List: 1)  Lisinopril-hydrochlorothiazide 20-12.5 Mg Tabs (Lisinopril-hydrochlorothiazide) .... Take 1 tablet by mouth once a day for blood pressure 2)  Naprosyn 500 Mg Tabs (Naproxen) .... Take 1 tablet by mouth two times a day as needed for pain 3)  Fish Oil 1000 Mg Caps (Omega-3 fatty acids) .... Take 2 by mouth once daily 4)  Celexa 20 Mg Tabs (Citalopram hydrobromide) .... Take 1 tablet by mouth once a day

## 2010-02-10 NOTE — Progress Notes (Signed)
Summary: Out of meds?  Phone Note Outgoing Call   Summary of Call: I rec'd a flag from JM about him throwing away his meds. Please find out if he is out of anything and if so, what.  Initial call taken by: Brynda Rim,  September 06, 2009 2:15 PM  Follow-up for Phone Call        Pt. in office this morning -- new Rx given to pt.  Dutch Quint RN  September 07, 2009 10:07 AM

## 2010-02-10 NOTE — Letter (Signed)
Summary: REQUESTING RECORDS FROM [facility]  REQUESTING RECORDS FROM [facility]   Imported By: Arta Bruce 05/24/2009 15:07:22  _____________________________________________________________________  External Attachment:    Type:   Image     Comment:   External Document

## 2010-02-10 NOTE — Letter (Signed)
Summary: [facility]ED REQUESTED RECORDS TO [facility]  MAILED REQUESTED RECORDS TO [facility]   Imported By: Arta Bruce 07/02/2009 10:17:21  _____________________________________________________________________  External Attachment:    Type:   Image     Comment:   External Document

## 2010-02-10 NOTE — Progress Notes (Signed)
Summary: Rx for Celexa  Phone Note Outgoing Call   Summary of Call: Tell patient I want to start him on Celexa for depression. Rx in your basket to send to his pharmacy. Please make sure he has f/u in 2-3 weeks for depression. Initial call taken by: Brynda Rim,  May 21, 2009 3:37 PM  Follow-up for Phone Call        Advised re Celexa Rx; confirmed f/u sppt.  Rx faxed to [facility]. Follow-up by: Dutch Quint RN,  May 24, 2009 11:10 AM

## 2010-02-10 NOTE — Progress Notes (Signed)
Summary: Office Visit//depression screening  Office Visit//depression screening   Imported By: Arta Bruce 08/02/2009 12:43:08  _____________________________________________________________________  External Attachment:    Type:   Image     Comment:   External Document

## 2010-02-10 NOTE — Assessment & Plan Note (Signed)
Summary: Rescheduled appt   Vital Signs:  Patient profile:   50 year old male Height:      69.25 inches Weight:      178.3 pounds BMI:     26.24 BSA:     1.97 Temp:     97.6 degrees F oral Pulse rate:   78 / minute Pulse rhythm:   regular Resp:     18 per minute BP sitting:   140 / 98  (left arm) Cuff size:   regular  Vitals Entered By: Gaylyn Cheers RN (January 11, 2010 8:33 AM) CC: CPE; pt. rescheduled provider not in office.  Is Patient Diabetic? No Pain Assessment Patient in pain? yes     Location: rt knee/back Intensity: 7/6 Type: burning/sharp Onset of pain  Chronic  Does patient need assistance? Functional Status Self care Ambulation Normal Comments NAPROSYN 500 MG TABS (NAPROXEN) out several months FISH OIL 1000 MG CAPS (OMEGA-3 FATTY ACIDS) none since Aug.   CC:  CPE; pt. rescheduled provider not in office. .  Allergies (verified): 1)  ! Asa   Complete Medication List: 1)  Lisinopril-hydrochlorothiazide 20-12.5 Mg Tabs (Lisinopril-hydrochlorothiazide) .... Take 1 tablet by mouth once a day for blood pressure 2)  Naprosyn 500 Mg Tabs (Naproxen) .... Take 1 tablet by mouth two times a day as needed for pain 3)  Fish Oil 1000 Mg Caps (Omega-3 fatty acids) .... Take 2 by mouth once daily 4)  Celexa 20 Mg Tabs (Citalopram hydrobromide) .... Take 1 tablet by mouth once a day 5)  Haldol 1 Mg (haloperidol Lactate)  .... Take 1/2 by mouth at bedtime

## 2010-02-10 NOTE — Letter (Signed)
Summary: *HSN Results Follow up  [facility]  [address] [place], [place] [address]   Phone: [phone]  Fax: [phone]      03/25/2009   [name] [address] [place], [place]  [address]   Dear  [name],                            ____S.Drinkard,FNP   ____D. Gore,FNP       ____B. McPherson,MD   ____V. Rankins,MD    ____E. Mulberry,MD    ____N. Daphine Deutscher, FNP  ____D. Reche Dixon, MD    ____K. Philipp Deputy, MD    __x__S. Alben Spittle, PA-C     This letter is to inform you that your recent test(s):  _______Pap Smear    ___x____Lab Test     _______X-ray    ___x____ is within acceptable limits  _______ requires a medication change  ___x____ requires a follow-up lab visit  _______ requires a follow-up visit with your provider   Comments: Make sure you come in for blood test in 2 weeks.  We want to check your kidney function again after changing your blood pressure medicine.       _________________________________________________________ If you have any questions, please contact our office                     Sincerely,  Tereso Newcomer PA-C [facility]

## 2010-02-10 NOTE — Letter (Signed)
Summary: Generic Letter  [facility]  [address] [place], [place] [address]   Phone: [phone]  Fax: [phone]    05/21/2009  To whom it may concern:  [name] is a patient of mine.  I first met him about 3 months ago.  He has a long history of back pain which I am currently evaluating.  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  He has no training to do anything else.  Since his back problem has worsened, he has been unable to find meaningful employment.  Please take this into consideration as you review his case.       Sincerely,   Tereso Newcomer PA-C

## 2010-02-10 NOTE — Letter (Signed)
Summary: Lipid Letter  [facility]  [address] [place], [place] [address]   Phone: [phone]  Fax: [phone]    04/19/2009 Raymond [name] [address] [place], [place]  [address]  Dear Caryn Bee:  Your cholesterol results are as follows.  Please see the recommendations.   Cholesterol:       149     Goal: < 200   HDL "good" Cholesterol:   38     Goal: > 40   LDL "bad" Cholesterol:   44     Goal: < 130   Triglycerides:       335     Goal: < 150   Start Fish Oil 1000 mg . . . Take 2 caps once daily     TLC Diet (Therapeutic Lifestyle Change): Saturated Fats & Transfatty acids should be kept < 7% of total calories ***Reduce Saturated Fats Polyunstaurated Fat can be up to 10% of total calories Monounsaturated Fat Fat can be up to 20% of total calories Total Fat should be no greater than 25-35% of total calories Carbohydrates should be 50-60% of total calories Protein should be approximately 15% of total calories Fiber should be at least 20-30 grams a day ***Increased fiber may help lower LDL Total Cholesterol should be < 200mg /day Consider adding plant stanol/sterols to diet (example: Benacol spread) ***A higher intake of unsaturated fat may reduce Triglycerides and Increase HDL    Adjunctive Measures (may lower LIPIDS and reduce risk of Heart Attack) include: **Aerobic Exercise (20-30 minutes 3-4 times a week) **Limit Alcohol Consumption Weight Reduction Aspirin 75-81 mg a day by mouth (if not allergic or contraindicated) Dietary Fiber 20-30 grams a day by mouth    Current Medications: 1)    Lisinopril-hydrochlorothiazide 20-12.5 Mg Tabs (Lisinopril-hydrochlorothiazide) .... Take 1 tablet by mouth once a day for blood pressure 2)    Naprosyn 500 Mg Tabs (Naproxen) .... Take 1 tablet by mouth two times a day as needed for pain 3)    Tramadol Hcl 50 Mg Tabs (Tramadol hcl) .... Take 1 by mouth up three times a day as needed for pain 4)    Fish Oil 1000 Mg Caps  (Omega-3 fatty acids) .... Take 2 by mouth once daily  If you have any questions, please call.  Sincerely,   [facility] Tereso Newcomer PA-C

## 2010-02-10 NOTE — Progress Notes (Signed)
Summary: Wants opthamology referral  Phone Note Call from Patient   Summary of Call: States that he is having some problems with his left eye -- that it "fogs over"  and sometimes "looks like I'm seeing rainbows with it."  Wants opthamology referral. Initial call taken by: Dutch Quint RN,  September 21, 2009 10:56 AM  Follow-up for Phone Call        We don't have an eye doctor. It does not look like he has Medicaid or Medicare. We have a hard time getting patients in to ophthalmology with the [facility]. He should go to [facility] or [facility] and have an eye exam and ask them to check for problems related to his symptoms.  They often run specials for $50 or so.  Follow-up by: Tereso Newcomer PA-C,  September 21, 2009 1:14 PM  Additional Follow-up for Phone Call Additional follow up Details #1::        Advised of provider's response and recommendations.  Verbalized understanding.  Dutch Quint RN  September 21, 2009 3:58 PM

## 2010-02-10 NOTE — Letter (Signed)
Summary: TEST ORDER FORM//MRI//APPT DATE & TIME  TEST ORDER FORM//MRI//APPT DATE & TIME   Imported By: Arta Bruce 05/21/2009 12:02:44  _____________________________________________________________________  External Attachment:    Type:   Image     Comment:   External Document

## 2010-02-10 NOTE — Miscellaneous (Signed)
Summary: Rehab Report//DISCHARGE  Rehab Report//DISCHARGE   Imported By: Arta Bruce 04/12/2009 08:47:43  _____________________________________________________________________  External Attachment:    Type:   Image     Comment:   External Document

## 2010-02-10 NOTE — Letter (Signed)
Summary: *HSN Results Follow up  [facility]  [address] [place], [place] [address]   Phone: [phone]  Fax: [phone]      08/20/2009   Raymond [name] [address] [place], [place]  [address]   Dear  [name],                            ____S.Drinkard,FNP   ____D. Gore,FNP       ____B. McPherson,MD   ____V. Rankins,MD    ____E. Mulberry,MD    ____N. Daphine Deutscher, FNP  ____D. Reche Dixon, MD    ____K. Philipp Deputy, MD    __x__S. Alben Spittle, PA-C     This letter is to inform you that your recent test(s):  _______Pap Smear    ____x___Lab Test     _______X-ray    __x_____ is within acceptable limits  _______ requires a medication change  _______ requires a follow-up lab visit  ___x____ requires a follow-up visit with your provider   Comments:  Kidney function is ok.  We need to repeat a blood pressure check.  Please call to arrange a visit with the nurse.       _________________________________________________________ If you have any questions, please contact our office                     Sincerely,  Raymond Newcomer PA-C [facility]

## 2010-02-10 NOTE — Assessment & Plan Note (Signed)
Summary: 3 WEEK FU////KT   Vital Signs:  Patient profile:   50 year old male Height:      69.25 inches Weight:      178 pounds BMI:     26.19 Temp:     97.9 degrees F oral Pulse rate:   65 / minute Pulse rhythm:   regular Resp:     18 per minute BP sitting:   140 / 90  (left arm) Cuff size:   large  Vitals Entered By: [name] (July 27, 2009 11:59 AM) CC: three week f/u... Is Patient Diabetic? Yes Pain Assessment Patient in pain? yes       Does patient need assistance? Functional Status Self care Ambulation Normal   Primary Care Provider:  Tereso Newcomer PA-C  CC:  three week f/u....  History of Present Illness: Depression:  Here for f/u.  Have not seen him in several weeks.  He was supposed to f/u with me about 6 weeks ago. I started him on celexa.  However, he never started what I gave him.  He was referred to psych by his counselor.  He saw Dr. Migdalia Dk.  She put him on Celexa 06/30/2009 (according to his Rx bottle).  He scored a 14 on his PHQ9 today.  But, with starting his SSRI 3 weeks ago, would not expect much effect at this point.  Does not have suicidal thoughts.  Has f/u with psych 7/25.    HTN:  Taking Lisinopril every day.  No shortness of breath, chest pain or syncope.  Back pain:  Had some DDD on MRI.  No HNP.  Patient had ESI with interventional radiology.  He notes improvement with pain with one injection for about a month or so.  However, his pain is gradually coming back.  He had not yet called for a follow up at radiology.  Dental pain:  Had extraction.  Has another tooth bothering him now as well.  Substance abuse:  Still going to counseling.  Does admit to using marijuana recently.  No crack since April.  No alcohol since July 4.   Problems Prior to Update: 1)  Abscess, Tooth  (ICD-522.5) 2)  Hypertriglyceridemia  (ICD-272.1) 3)  Depression  (ICD-311) 4)  Cocaine Abuse  (ICD-305.60) 5)  Urinalysis, Abnormal  (ICD-791.9) 6)  Alcohol  Use  (ICD-305.00) 7)  Tobacco Abuse  (ICD-305.1) 8)  Back Pain, Lumbar  (ICD-724.2) 9)  Preventive Health Care  (ICD-V70.0) 10)  Hypertension  (ICD-401.9) 11)  Osteoarthritis  (ICD-715.90)  Current Medications (verified): 1)  Lisinopril-Hydrochlorothiazide 20-12.5 Mg Tabs (Lisinopril-Hydrochlorothiazide) .... Take 1 Tablet By Mouth Once A Day For Blood Pressure 2)  Naprosyn 500 Mg Tabs (Naproxen) .... Take 1 Tablet By Mouth Two Times A Day As Needed For Pain 3)  Tramadol Hcl 50 Mg Tabs (Tramadol Hcl) .... Take 1 By Mouth Up Three Times A Day As Needed For Pain 4)  Fish Oil 1000 Mg Caps (Omega-3 Fatty Acids) .... Take 2 By Mouth Once Daily 5)  Hydrocodone-Acetaminophen 5-500 Mg Tabs (Hydrocodone-Acetaminophen) .... Take One and A Half Tablet By Mouth Twice Daily As Needed For Pain *sami Hassan,md 6)  Diclofenac Sodium 75 Mg Tbec (Diclofenac Sodium) .... Take 1 Tablet By Mouth Two Times A Day As Needed For Pain 7)  Celexa 10 Mg Tabs (Citalopram Hydrobromide) .... Take 1 Tablet By Mouth Once A Day  Allergies (verified): 1)  ! Jonne Ply  Past History:  Past Medical History: Last updated: 03/14/2009 Osteoarthritis  a.  h/o right knee arthritis h/o fall/accident 1995 (?)   a.  [place], [place] - seen in hosp there   b.  states his back was "broken"   c.  no surgery Hypertension   a.  never been on med in past h/o "abnormal blood vessel" in head; treated at [facility]. early 1990s   a.  admx to [facility] in 1989 with diplopia, nystagmus and facial weakness   b.  Left Pontine Lesion noted   c.  Patient transferred to [facility]   Physical Exam  General:  alert, well-developed, and well-nourished.   Head:  normocephalic and atraumatic.   Eyes:  pupils equal, pupils round, and pupils reactive to light.   Ears:  R ear normal and L ear normal.   Nose:  no external deformity.   Mouth:  poor dentition and teeth missing.   lower left molar decayed and broken in half some  surrounding erythema of gums Neck:  + enlargement left anterior cervical node Lungs:  normal breath sounds, no crackles, and no wheezes.   Heart:  normal rate and regular rhythm.   Abdomen:  soft and non-tender.   Neurologic:  alert & oriented X3 and cranial nerves II-XII intact.   Psych:  normally interactive.     Impression & Recommendations:  Problem # 1:  DEPRESSION (ICD-311) continue follow up with counselor and psych  His updated medication list for this problem includes:    Celexa 20 Mg Tabs (Citalopram hydrobromide) .Marland Kitchen... Take 1 tablet by mouth once a day  Problem # 2:  ABSCESS, TOOTH (ICD-522.5) with enlarged LNs on left, will retreat with Amox (says he never took anyway) sees dentist next week will bring him back to see me in several weeks to recheck his LNs  Problem # 3:  HYPERTENSION (ICD-401.9) repeat by me 160/100 only taking lisinopril 20 once daily will reprint Rx for Lisinopril/HCT and start this rpt BP ck in 2 weeks with bmet  His updated medication list for this problem includes:    Lisinopril-hydrochlorothiazide 20-12.5 Mg Tabs (Lisinopril-hydrochlorothiazide) .Marland Kitchen... Take 1 tablet by mouth once a day for blood pressure  Problem # 4:  COCAINE ABUSE (ICD-305.60) continue substance abuse counseling  Problem # 5:  ALCOHOL USE (ICD-305.00) as above  Problem # 6:  BACK PAIN, LUMBAR (ICD-724.2) encouraged him to reschedule his back injections  The following medications were removed from the medication list:    Tramadol Hcl 50 Mg Tabs (Tramadol hcl) .Marland Kitchen... Take 1 by mouth up three times a day as needed for pain    Hydrocodone-acetaminophen 5-500 Mg Tabs (Hydrocodone-acetaminophen) .Marland Kitchen... Take one and a half tablet by mouth twice daily as needed for pain *sami hassan,md    Diclofenac Sodium 75 Mg Tbec (Diclofenac sodium) .Marland Kitchen... Take 1 tablet by mouth two times a day as needed for pain His updated medication list for this problem includes:    Naprosyn 500 Mg Tabs  (Naproxen) .Marland Kitchen... Take 1 tablet by mouth two times a day as needed for pain  Complete Medication List: 1)  Lisinopril-hydrochlorothiazide 20-12.5 Mg Tabs (Lisinopril-hydrochlorothiazide) .... Take 1 tablet by mouth once a day for blood pressure 2)  Naprosyn 500 Mg Tabs (Naproxen) .... Take 1 tablet by mouth two times a day as needed for pain 3)  Fish Oil 1000 Mg Caps (Omega-3 fatty acids) .... Take 2 by mouth once daily 4)  Celexa 20 Mg Tabs (Citalopram hydrobromide) .... Take 1 tablet by mouth once a day  5)  Amoxicillin 500 Mg Caps (Amoxicillin) .... Take 1 capsule by mouth three times a day  Patient Instructions: 1)  Please schedule a follow-up appointment in 3 months with Scott for CPE.  Come fasting for lab work.  Nothing to eat or drink after midnight except water. 2)  You can take Tylenol 500 mg 2 tabs by mouth every 6 hours as needed for pain.  You can try to take Tylenol 500 mg 2 tabs two times a day every day to keep pain at a minimum. 3)  You can take the Naproxen 500 mg two times a day with food only as needed for pain. 4)  Follow up with your counselor for your celexa. 5)  Start taking Lisinopril/HCT 20/12.5 mg once daily for your blood pressure. 6)  Come to lab to see the nurse for a blood pressure check and a BMET in 2 weeks. 7)  Schedule appointment with Scott in 6 weeks to check blood pressure and to re-examine your neck. 8)  Please call radiology to schedule another injection for your back. Prescriptions: LISINOPRIL-HYDROCHLOROTHIAZIDE 20-12.5 MG TABS (LISINOPRIL-HYDROCHLOROTHIAZIDE) Take 1 tablet by mouth once a day for blood pressure  #30 x 5   Entered and Authorized by:   Tereso Newcomer PA-C   Signed by:   Tereso Newcomer PA-C on 07/27/2009   Method used:   Print then Give to Patient   RxID:   [id number] AMOXICILLIN 500 MG CAPS (AMOXICILLIN) Take 1 capsule by mouth three times a day  #30 x 0   Entered and Authorized by:   Tereso Newcomer PA-C   Signed by:   Tereso Newcomer  PA-C on 07/27/2009   Method used:   Print then Give to Patient   RxID:   [id number]

## 2010-02-10 NOTE — Letter (Signed)
Summary: *HSN Results Follow up  [facility]  [address] [place], [place] [address]   Phone: [phone]  Fax: [phone]      09/22/2009   Raymond [name] [address]  [place], [place]  [address]   Dear  Mr. [name],                            ____S.Drinkard,FNP   ____D. Gore,FNP       ____B. McPherson,MD   ____V. Rankins,MD    ____E. Mulberry,MD    ____N. Daphine Deutscher, FNP  ____D. Reche Dixon, MD    ____K. Philipp Deputy, MD    __x__S. Alben Spittle, PA-C     This letter is to inform you that your recent test(s):  _______Pap Smear    ___x____Lab Test     _______X-ray    ___x____ is within acceptable limits  _______ requires a medication change  _______ requires a follow-up lab visit  _______ requires a follow-up visit with your provider   Comments: Cholesterol numbers and liver function tests look good.  Continue the same medicines.       _________________________________________________________ If you have any questions, please contact our office                     Sincerely,  Tereso Newcomer PA-C [facility]

## 2010-02-10 NOTE — Progress Notes (Signed)
Summary: Office Visit//DEPRESSION SCREENING  Office Visit//DEPRESSION SCREENING   Imported By: Arta Bruce 05/31/2009 15:37:10  _____________________________________________________________________  External Attachment:    Type:   Image     Comment:   External Document

## 2010-02-10 NOTE — Progress Notes (Signed)
  Phone Note From Other Clinic   Summary of Call: pt has referral in system for epidural injections Initial call taken by: [name],  Jun 01, 2009 2:14 PM

## 2010-04-02 ENCOUNTER — Emergency Department (HOSPITAL_COMMUNITY)
Admission: EM | Admit: 2010-04-02 | Discharge: 2010-04-02 | Disposition: A | Discharge status: Home / Self Care | Payer: Medicaid Other | Attending: Emergency Medicine | Admitting: Emergency Medicine

## 2010-04-02 DIAGNOSIS — M25569 Pain in unspecified knee: Secondary | ICD-10-CM | POA: Insufficient documentation

## 2010-04-02 DIAGNOSIS — S335XXA Sprain of ligaments of lumbar spine, initial encounter: Secondary | ICD-10-CM | POA: Insufficient documentation

## 2010-04-02 DIAGNOSIS — M1990 Unspecified osteoarthritis, unspecified site: Secondary | ICD-10-CM | POA: Insufficient documentation

## 2010-04-02 DIAGNOSIS — M545 Low back pain, unspecified: Secondary | ICD-10-CM | POA: Insufficient documentation

## 2010-04-02 DIAGNOSIS — I10 Essential (primary) hypertension: Secondary | ICD-10-CM | POA: Insufficient documentation

## 2010-04-02 DIAGNOSIS — X58XXXA Exposure to other specified factors, initial encounter: Secondary | ICD-10-CM | POA: Insufficient documentation

## 2010-05-23 ENCOUNTER — Ambulatory Visit: Payer: Medicaid Other | Admitting: Physical Therapy

## 2010-06-03 ENCOUNTER — Ambulatory Visit (HOSPITAL_COMMUNITY)
Admission: RE | Admit: 2010-06-03 | Discharge: 2010-06-03 | Disposition: A | Discharge status: Home / Self Care | Payer: Medicaid Other | Source: Ambulatory Visit | Attending: Orthopedic Surgery | Admitting: Orthopedic Surgery

## 2010-06-03 ENCOUNTER — Other Ambulatory Visit (HOSPITAL_COMMUNITY): Payer: Self-pay | Admitting: Orthopedic Surgery

## 2010-06-03 ENCOUNTER — Encounter (HOSPITAL_COMMUNITY)
Admission: RE | Admit: 2010-06-03 | Discharge: 2010-06-03 | Disposition: A | Discharge status: Home / Self Care | Payer: Medicaid Other | Source: Ambulatory Visit | Attending: Orthopedic Surgery | Admitting: Orthopedic Surgery

## 2010-06-03 DIAGNOSIS — M8560 Other cyst of bone, unspecified site: Secondary | ICD-10-CM

## 2010-06-03 DIAGNOSIS — I10 Essential (primary) hypertension: Secondary | ICD-10-CM | POA: Insufficient documentation

## 2010-06-03 DIAGNOSIS — Z01818 Encounter for other preprocedural examination: Secondary | ICD-10-CM | POA: Insufficient documentation

## 2010-06-03 DIAGNOSIS — S83249A Other tear of medial meniscus, current injury, unspecified knee, initial encounter: Secondary | ICD-10-CM

## 2010-06-03 DIAGNOSIS — Z01812 Encounter for preprocedural laboratory examination: Secondary | ICD-10-CM | POA: Insufficient documentation

## 2010-06-03 DIAGNOSIS — M23305 Other meniscus derangements, unspecified medial meniscus, unspecified knee: Secondary | ICD-10-CM | POA: Insufficient documentation

## 2010-06-03 LAB — CBC
Hemoglobin: 15.9 g/dL (ref 13.0–17.0)
MCV: 84.4 fL (ref 78.0–100.0)
Platelets: 174 10*3/uL (ref 150–400)
RBC: 5.56 MIL/uL (ref 4.22–5.81)
RDW: 14.3 % (ref 11.5–15.5)
WBC: 6.5 10*3/uL (ref 4.0–10.5)

## 2010-06-03 LAB — BASIC METABOLIC PANEL
Calcium: 9.7 mg/dL (ref 8.4–10.5)
Creatinine, Ser: 1.12 mg/dL (ref 0.4–1.5)
GFR calc non Af Amer: >60 mL/min (ref >60)
Sodium: 141 mEq/L (ref 135–145)

## 2010-06-03 LAB — SURGICAL PCR SCREEN: Staphylococcus aureus: NEGATIVE

## 2010-06-10 ENCOUNTER — Ambulatory Visit (HOSPITAL_COMMUNITY): Payer: Medicaid Other

## 2010-06-10 ENCOUNTER — Ambulatory Visit (HOSPITAL_COMMUNITY)
Admission: RE | Admit: 2010-06-10 | Discharge: 2010-06-10 | Disposition: A | Discharge status: Home / Self Care | Payer: Medicaid Other | Source: Ambulatory Visit | Attending: Orthopedic Surgery | Admitting: Orthopedic Surgery

## 2010-06-10 DIAGNOSIS — J4489 Other specified chronic obstructive pulmonary disease: Secondary | ICD-10-CM | POA: Insufficient documentation

## 2010-06-10 DIAGNOSIS — F17200 Nicotine dependence, unspecified, uncomplicated: Secondary | ICD-10-CM | POA: Insufficient documentation

## 2010-06-10 DIAGNOSIS — I10 Essential (primary) hypertension: Secondary | ICD-10-CM | POA: Insufficient documentation

## 2010-06-10 DIAGNOSIS — Z5333 Arthroscopic surgical procedure converted to open procedure: Secondary | ICD-10-CM | POA: Insufficient documentation

## 2010-06-10 DIAGNOSIS — M84369A Stress fracture, unspecified tibia and fibula, initial encounter for fracture: Secondary | ICD-10-CM | POA: Insufficient documentation

## 2010-06-10 DIAGNOSIS — J449 Chronic obstructive pulmonary disease, unspecified: Secondary | ICD-10-CM | POA: Insufficient documentation

## 2010-06-10 DIAGNOSIS — M23329 Other meniscus derangements, posterior horn of medial meniscus, unspecified knee: Secondary | ICD-10-CM | POA: Insufficient documentation

## 2010-06-10 DIAGNOSIS — M6750 Plica syndrome, unspecified knee: Secondary | ICD-10-CM | POA: Insufficient documentation

## 2010-06-10 DIAGNOSIS — M2240 Chondromalacia patellae, unspecified knee: Secondary | ICD-10-CM | POA: Insufficient documentation

## 2010-06-10 HISTORY — PX: OTHER SURGICAL HISTORY: SHX169

## 2010-06-16 NOTE — Op Note (Signed)
NAMEALONZA, KNISLEY                 ACCOUNT NO.:  [id number]  MEDICAL RECORD NO.:  [id number]           PATIENT TYPE:  O  LOCATION:  [facility]                         FACILITY:  [facility]  PHYSICIAN:  Harvie Junior, M.D.   DATE OF BIRTH:  August 12, 1960  DATE OF PROCEDURE:  06/10/2010 DATE OF DISCHARGE:  06/10/2010                              OPERATIVE REPORT   PREOPERATIVE DIAGNOSIS:  Medial meniscal tear with arthritic change and stress fracture, medial tibial plateau.  POSTOPERATIVE DIAGNOSIS:  Medial meniscal tear with arthritic change and stress fracture, medial tibial plateau with a medial shelf plica and chondromalacia patellofemoral joint.  PROCEDURE: 1. Arthroscopically assisted treatment of medial tibial plateau     fracture with bone substitute via cannulated drill placement.     Arthroscopically and fluoroscopically aided treatment of tibial     plateau stress fracture. 2. Arthroscopic medial meniscectomy with corresponding debridement of     medial compartment. 3. Arthroscopic debridement of chondromalacia patella. 4. Arthroscopic debridement of medial shelf plica.  SURGEON:  Harvie Junior, MD.  ASSISTANT:  Marshia Ly, PA.  ANESTHESIA:  General.  BRIEF HISTORY:  Mr. Halt is a 50 year old male with long history of having had significant medial pain.  We treated him conservatively for a period of time.  MRI was obtained which showed that he had a dramatic medial tibial plateau stress reaction with significant chondromalacia of the of medial femoral condyle, medial tibial plateau, showed a posterior horn medial meniscal tear and lateral meniscal tear as well.  He was brought to the operating room for treatment of medial pain and treatment of his stress reaction.  DESCRIPTION OF PROCEDURE:  The patient was brought to the operating room after adequate anesthesia was obtained under general anesthetic.  The patient was placed supine on the operating table.  The right leg  was prepped and draped in usual sterile fashion.  Following this, routine arthroscopic examination of the knee revealed there was an obvious classic posterior horn medial meniscal tear at the mid junction of the midbody and posterior third, this was debrided with a combination of straight biting forceps, upbiting forceps, remaining meniscal rim was contoured down with a suction shaver.  There was unfortunate grade 4 change of the tibial plateau and the classic central location.  There was significant chondromalacia in this area and this was debrided. Attention was turned to the ACL normal, lateral side normal.  Attention turned up to the patellofemoral joint where there was some chondromalacia patella which was debrided.  Attention turned back to the medial side where there was a large medial shelf plica which was debrided and there was an area of the mid lateral medial femoral condyle anteriorly, which it looked like this plica had been rubbing against. At his point, the arthroscopic portion of the case was stopped and then the leg was exsanguinated.  Blood pressure tourniquet was inflated to 350 mmHg and the guide was placed for the subchondroplasty.  This was done with two drill and pins into the tibia and once this was done, we selected the B2 channels and the guide was used  to drill slightly into the subchondral bone.  Once that was completed, this was all done fluoroscopically aided and assisted, then 5 mL of bone substitute was injected into this area.  Cannula was held until the bone substitute was hardened and then the cannula was removed.  A small amount did track out along the MCL and this was smoothed down with pressure, but we did not feel as a bony area.  Under fluoroscopic imaging, we could clearly see the treatment of this medial tibial plateau stress fracture with the bone substitute which had a slight amount of radiopacity to it.  Once this was completed, we looked back  in the joint to make sure there was no extravasation of the joint, seeing none, the knee was thoroughly and copiously lavaged, suctioned dry.  All sterile portals were closed with a bandage.  Small stitch was placed into the small incision made for the percutaneous treatment of his tibial plateau stress fracture.  At this point, the final fluoro images were taken and the patient was taken to recovery room and noted to be in satisfactory condition.  Estimated blood loss for procedure was none.     Harvie Junior, M.D.     Ranae Plumber  D:  06/10/2010  T:  06/10/2010  Job:  [id number]  Electronically Signed by Jodi Geralds M.D. on 06/16/2010 02:12:49 PM

## 2010-06-23 ENCOUNTER — Emergency Department (HOSPITAL_COMMUNITY)
Admission: EM | Admit: 2010-06-23 | Discharge: 2010-06-23 | Disposition: A | Discharge status: Home / Self Care | Payer: No Typology Code available for payment source | Attending: Emergency Medicine | Admitting: Emergency Medicine

## 2010-06-23 ENCOUNTER — Emergency Department (HOSPITAL_COMMUNITY): Payer: No Typology Code available for payment source

## 2010-06-23 DIAGNOSIS — F329 Major depressive disorder, single episode, unspecified: Secondary | ICD-10-CM | POA: Insufficient documentation

## 2010-06-23 DIAGNOSIS — R209 Unspecified disturbances of skin sensation: Secondary | ICD-10-CM | POA: Insufficient documentation

## 2010-06-23 DIAGNOSIS — Z79899 Other long term (current) drug therapy: Secondary | ICD-10-CM | POA: Insufficient documentation

## 2010-06-23 DIAGNOSIS — I10 Essential (primary) hypertension: Secondary | ICD-10-CM | POA: Insufficient documentation

## 2010-06-23 DIAGNOSIS — M545 Low back pain, unspecified: Secondary | ICD-10-CM | POA: Insufficient documentation

## 2010-06-23 DIAGNOSIS — F3289 Other specified depressive episodes: Secondary | ICD-10-CM | POA: Insufficient documentation

## 2010-06-28 ENCOUNTER — Ambulatory Visit: Payer: Medicaid Other | Attending: Orthopedic Surgery

## 2010-06-28 DIAGNOSIS — IMO0001 Reserved for inherently not codable concepts without codable children: Secondary | ICD-10-CM | POA: Insufficient documentation

## 2010-06-28 DIAGNOSIS — M25569 Pain in unspecified knee: Secondary | ICD-10-CM | POA: Insufficient documentation

## 2010-06-28 DIAGNOSIS — R262 Difficulty in walking, not elsewhere classified: Secondary | ICD-10-CM | POA: Insufficient documentation

## 2010-06-28 DIAGNOSIS — M25669 Stiffness of unspecified knee, not elsewhere classified: Secondary | ICD-10-CM | POA: Insufficient documentation

## 2010-07-12 ENCOUNTER — Ambulatory Visit: Payer: Medicaid Other | Attending: Orthopedic Surgery

## 2010-07-12 DIAGNOSIS — M25569 Pain in unspecified knee: Secondary | ICD-10-CM | POA: Insufficient documentation

## 2010-07-12 DIAGNOSIS — M25669 Stiffness of unspecified knee, not elsewhere classified: Secondary | ICD-10-CM | POA: Insufficient documentation

## 2010-07-12 DIAGNOSIS — IMO0001 Reserved for inherently not codable concepts without codable children: Secondary | ICD-10-CM | POA: Insufficient documentation

## 2010-07-12 DIAGNOSIS — R262 Difficulty in walking, not elsewhere classified: Secondary | ICD-10-CM | POA: Insufficient documentation

## 2010-07-19 ENCOUNTER — Ambulatory Visit: Payer: Medicaid Other

## 2011-05-15 ENCOUNTER — Ambulatory Visit
Admission: RE | Admit: 2011-05-15 | Discharge: 2011-05-15 | Disposition: A | Discharge status: Home / Self Care | Payer: Medicaid Other | Source: Ambulatory Visit | Attending: Orthopedic Surgery | Admitting: Orthopedic Surgery

## 2011-05-15 ENCOUNTER — Ambulatory Visit (HOSPITAL_COMMUNITY): Payer: No Typology Code available for payment source

## 2011-05-15 ENCOUNTER — Other Ambulatory Visit: Payer: Self-pay | Admitting: Orthopedic Surgery

## 2011-05-15 DIAGNOSIS — R52 Pain, unspecified: Secondary | ICD-10-CM

## 2011-05-16 ENCOUNTER — Encounter (HOSPITAL_COMMUNITY): Payer: Self-pay | Admitting: Respiratory Therapy

## 2011-05-25 ENCOUNTER — Encounter (HOSPITAL_COMMUNITY): Payer: Self-pay

## 2011-05-25 ENCOUNTER — Encounter (HOSPITAL_COMMUNITY)
Admission: RE | Admit: 2011-05-25 | Discharge: 2011-05-25 | Disposition: A | Discharge status: Home / Self Care | Payer: Medicaid Other | Source: Ambulatory Visit | Attending: Surgery | Admitting: Surgery

## 2011-05-25 ENCOUNTER — Encounter (HOSPITAL_COMMUNITY)
Admission: RE | Admit: 2011-05-25 | Discharge: 2011-05-25 | Disposition: A | Discharge status: Home / Self Care | Payer: Medicaid Other | Source: Ambulatory Visit | Attending: Orthopedic Surgery | Admitting: Orthopedic Surgery

## 2011-05-25 HISTORY — DX: Depression, unspecified: F32.A

## 2011-05-25 HISTORY — DX: Essential (primary) hypertension: I10

## 2011-05-25 LAB — CBC
Hemoglobin: 16.2 g/dL (ref 13.0–17.0)
MCH: 28.4 pg (ref 26.0–34.0)
RBC: 5.71 MIL/uL (ref 4.22–5.81)
WBC: 4.7 10*3/uL (ref 4.0–10.5)

## 2011-05-25 LAB — COMPREHENSIVE METABOLIC PANEL
ALT: 53 U/L (ref 0–53)
Alkaline Phosphatase: 39 U/L (ref 39–117)
CO2: 26 mEq/L (ref 19–32)
Calcium: 9.2 mg/dL (ref 8.4–10.5)
Chloride: 103 mEq/L (ref 96–112)
GFR calc Af Amer: 68 mL/min — ABNORMAL LOW (ref >90)
GFR calc non Af Amer: 58 mL/min — ABNORMAL LOW (ref >90)
Glucose, Bld: 94 mg/dL (ref 70–99)
Potassium: 4.1 mEq/L (ref 3.5–5.1)
Sodium: 139 mEq/L (ref 135–145)
Total Bilirubin: 0.6 mg/dL (ref 0.3–1.2)

## 2011-05-25 LAB — URINALYSIS, ROUTINE W REFLEX MICROSCOPIC
Glucose, UA: NEGATIVE mg/dL
Hgb urine dipstick: NEGATIVE
Leukocytes, UA: NEGATIVE
Specific Gravity, Urine: 1.019 (ref 1.005–1.030)
pH: 5.5 (ref 5.0–8.0)

## 2011-05-25 LAB — SURGICAL PCR SCREEN: Staphylococcus aureus: NEGATIVE

## 2011-05-25 LAB — ABO/RH: ABO/RH(D): A NEG

## 2011-05-25 NOTE — Pre-Procedure Instructions (Signed)
20 ISSIAC JAMAR  05/25/2011   Your procedure is scheduled on:  May 31, 2011 (Wed)  Report to [facility] at 7:55 AM.  Call this number if you have problems the morning of surgery: [phone]   Remember:   Do not eat food:After Midnight.  May have clear liquids: up to 4 Hours before arrival.  Clear liquids include soda, tea, black coffee, apple or grape juice, broth.  Take these medicines the morning of surgery with A SIP OF WATER: norvasc, pain pill, wellbutrin   Do not wear jewelry, make-up or nail polish.  Do not wear lotions, powders, or perfumes. You may wear deodorant.  Do not shave 48 hours prior to surgery. Men may shave face and neck.  Do not bring valuables to the hospital.  Contacts, dentures or bridgework may not be worn into surgery.  Leave suitcase in the car. After surgery it may be brought to your room.  For patients admitted to the hospital, checkout time is 11:00 AM the day of discharge.   Patients discharged the day of surgery will not be allowed to drive home.  Name and phone number of your driver: na  Special Instructions: Incentive Spirometry - Practice and bring it with you on the day of surgery. and CHG Shower Use Special Wash: 1/2 bottle night before surgery and 1/2 bottle morning of surgery.   Please read over the following fact sheets that you were given: Pain Booklet, Coughing and Deep Breathing, Blood Transfusion Information, Total Joint Packet and Surgical Site Infection Prevention

## 2011-05-30 MED ORDER — CEFAZOLIN SODIUM-DEXTROSE 2-3 GM-% IV SOLR
2.0000 g | INTRAVENOUS | Status: AC
  Administered 2011-05-31: 2 g via INTRAVENOUS
  Filled 2011-05-30: qty 50

## 2011-05-30 NOTE — H&P (Signed)
  [facility] [address]   [address] [place], [place] [address] [phone] A Division of [facility]  Loreta Ave, M.D.     Robert A. Thurston Hole, M.D.     Lunette Stands, M.D. Eulas Post, M.D.    Buford Dresser, M.D. Estell Harpin, M.D. Ralene Cork, D.O.          Genene Churn. Barry Dienes, PA-C            Kirstin A. Shepperson, PA-C [place], OPA-C   RE: Zaccary, Creech                                [phone]      DOB: 1960/10/17 PROGRESS NOTE: 05-23-11 Chief complaint: Right knee pain.  History of present illness: 73 one year-old black male with a history of end stage DJD, right knee, and chronic pain.  Returns.  States that knee symptoms are unchanged from previous visit.  He is wanting to proceed with right total knee replacement as scheduled.   Current medications: Abilify, Amlodipine, Hydrocodone and Bupropion.   Allergies: Question Aspirin. Past medical/surgical history: Mental illness. Review of systems: Patient denies lightheadedness, dizziness, fevers or chills.  Denies cardiac, pulmonary, GI or GU issues. Family history: Positive for hypertension and diabetes. Social history: Admits smoking and alcohol use.  States that he possibly drinks a 6 pack on the weekends.  Patient is single.  EXAMINATION: Height: 5?10.  Weight: 195 pounds.  Temperature: 98.8.  Respirations: 24.  Blood pressure: 149/88.  Pulse: 71.  Pleasant black male, alert and oriented x 3 and in no acute distress.  Gait is antalgic.  No increase in respiratory effort.  Head is normocephalic, a traumatic.  PERRLA, EOMI.  Lungs: CTA bilaterally.  No wheezes noted.  Heart: RRR.  S1 and S2.  No murmurs noted.  Abdomen: Round and non-distended.  NBS x 4.  Soft and non-tender.  Right knee: Decreased range of motion.  Positive crepitus.  Joint line tender.  Positive effusion.  Ligaments stable.  Calf non-tender.  Neurovascularly intact.  Skin warm and  dry.   IMPRESSION: End stage DJD, right knee, and chronic pain.  Failed conservative treatment.   PLAN: We will proceed with right total knee replacement as scheduled.  Surgical procedure, along with potential rehab/recovery time discussed.  All questions answered.  Patient states that his girlfriend will be helping him post-op and advised that he will definitely need in home care for the first couple of weeks.  All questions answered.   Loreta Ave, M.D.   Electronically verified by Loreta Ave, M.D. DFM(JMO):jjh D 05-23-11 T 05-24-11

## 2011-05-31 ENCOUNTER — Encounter (HOSPITAL_COMMUNITY): Payer: Self-pay | Admitting: *Deleted

## 2011-05-31 ENCOUNTER — Ambulatory Visit (HOSPITAL_COMMUNITY): Payer: Medicaid Other | Admitting: *Deleted

## 2011-05-31 ENCOUNTER — Inpatient Hospital Stay (HOSPITAL_COMMUNITY)
Admission: RE | Admit: 2011-05-31 | Discharge: 2011-06-02 | DRG: 470 | Disposition: A | Discharge status: Home / Self Care | Payer: Medicaid Other | Source: Ambulatory Visit | Attending: Orthopedic Surgery | Admitting: Orthopedic Surgery

## 2011-05-31 ENCOUNTER — Encounter (HOSPITAL_COMMUNITY)
Admission: RE | Disposition: A | Discharge status: Home / Self Care | Payer: Self-pay | Source: Ambulatory Visit | Attending: Orthopedic Surgery

## 2011-05-31 ENCOUNTER — Encounter (HOSPITAL_COMMUNITY): Payer: Self-pay | Admitting: General Practice

## 2011-05-31 ENCOUNTER — Inpatient Hospital Stay (HOSPITAL_COMMUNITY): Payer: Medicaid Other

## 2011-05-31 DIAGNOSIS — F329 Major depressive disorder, single episode, unspecified: Secondary | ICD-10-CM | POA: Diagnosis present

## 2011-05-31 DIAGNOSIS — M1710 Unilateral primary osteoarthritis, unspecified knee: Principal | ICD-10-CM | POA: Diagnosis present

## 2011-05-31 DIAGNOSIS — F3289 Other specified depressive episodes: Secondary | ICD-10-CM | POA: Diagnosis present

## 2011-05-31 DIAGNOSIS — Z01818 Encounter for other preprocedural examination: Secondary | ICD-10-CM

## 2011-05-31 DIAGNOSIS — I10 Essential (primary) hypertension: Secondary | ICD-10-CM | POA: Diagnosis present

## 2011-05-31 DIAGNOSIS — Z01810 Encounter for preprocedural cardiovascular examination: Secondary | ICD-10-CM

## 2011-05-31 DIAGNOSIS — Z471 Aftercare following joint replacement surgery: Secondary | ICD-10-CM

## 2011-05-31 DIAGNOSIS — N289 Disorder of kidney and ureter, unspecified: Secondary | ICD-10-CM | POA: Diagnosis present

## 2011-05-31 DIAGNOSIS — G8929 Other chronic pain: Secondary | ICD-10-CM | POA: Diagnosis present

## 2011-05-31 DIAGNOSIS — F17200 Nicotine dependence, unspecified, uncomplicated: Secondary | ICD-10-CM | POA: Diagnosis present

## 2011-05-31 DIAGNOSIS — Z01812 Encounter for preprocedural laboratory examination: Secondary | ICD-10-CM

## 2011-05-31 HISTORY — PX: TOTAL KNEE ARTHROPLASTY: SHX125

## 2011-05-31 SURGERY — ARTHROPLASTY, KNEE, TOTAL
Anesthesia: General | Site: Knee | Laterality: Right | Wound class: Clean

## 2011-05-31 MED ORDER — BUPIVACAINE HCL (PF) 0.25 % IJ SOLN
INTRAMUSCULAR | Status: DC | PRN
  Administered 2011-05-31: 30 mL via INTRA_ARTICULAR

## 2011-05-31 MED ORDER — NALOXONE HCL 0.4 MG/ML IJ SOLN: 0.4000 mg | INTRAMUSCULAR | Status: DC | PRN

## 2011-05-31 MED ORDER — METHOCARBAMOL 500 MG PO TABS
500.0000 mg | ORAL_TABLET | Freq: Four times a day (QID) | ORAL | Status: DC | PRN
  Administered 2011-06-01 – 2011-06-02 (×5): 500 mg via ORAL
  Filled 2011-05-31 (×6): qty 1

## 2011-05-31 MED ORDER — METHOCARBAMOL 100 MG/ML IJ SOLN
500.0000 mg | Freq: Four times a day (QID) | INTRAVENOUS | Status: DC | PRN
  Administered 2011-05-31: 500 mg via INTRAVENOUS
  Filled 2011-05-31: qty 5

## 2011-05-31 MED ORDER — BUPIVACAINE-EPINEPHRINE PF 0.5-1:200000 % IJ SOLN
INTRAMUSCULAR | Status: DC | PRN
  Administered 2011-05-31: 150 mg

## 2011-05-31 MED ORDER — ACETAMINOPHEN 325 MG PO TABS: 650.0000 mg | ORAL_TABLET | Freq: Four times a day (QID) | ORAL | Status: DC | PRN

## 2011-05-31 MED ORDER — HYDRALAZINE HCL 20 MG/ML IJ SOLN
INTRAMUSCULAR | Status: DC | PRN
  Administered 2011-05-31: 5 mg via INTRAVENOUS

## 2011-05-31 MED ORDER — DROPERIDOL 2.5 MG/ML IJ SOLN: 0.6250 mg | INTRAMUSCULAR | Status: DC | PRN

## 2011-05-31 MED ORDER — DIPHENHYDRAMINE HCL 50 MG/ML IJ SOLN: 12.5000 mg | Freq: Four times a day (QID) | INTRAMUSCULAR | Status: DC | PRN

## 2011-05-31 MED ORDER — ACETAMINOPHEN 10 MG/ML IV SOLN
INTRAVENOUS | Status: DC | PRN
  Administered 2011-05-31: 1000 mg via INTRAVENOUS

## 2011-05-31 MED ORDER — ONDANSETRON HCL 4 MG/2ML IJ SOLN
4.0000 mg | Freq: Four times a day (QID) | INTRAMUSCULAR | Status: DC | PRN
  Filled 2011-05-31: qty 2

## 2011-05-31 MED ORDER — WARFARIN - PHARMACIST DOSING INPATIENT: Freq: Every day | Status: DC

## 2011-05-31 MED ORDER — BUPROPION HCL 75 MG PO TABS
75.0000 mg | ORAL_TABLET | Freq: Every day | ORAL | Status: DC
  Administered 2011-06-01 – 2011-06-02 (×2): 75 mg via ORAL
  Filled 2011-05-31 (×2): qty 1

## 2011-05-31 MED ORDER — METOCLOPRAMIDE HCL 5 MG/ML IJ SOLN: 5.0000 mg | Freq: Three times a day (TID) | INTRAMUSCULAR | Status: DC | PRN

## 2011-05-31 MED ORDER — AMLODIPINE BESYLATE 10 MG PO TABS
10.0000 mg | ORAL_TABLET | Freq: Every day | ORAL | Status: DC
  Administered 2011-06-02: 10 mg via ORAL
  Filled 2011-05-31 (×2): qty 1

## 2011-05-31 MED ORDER — SODIUM CHLORIDE 0.9 % IJ SOLN: 9.0000 mL | INTRAMUSCULAR | Status: DC | PRN

## 2011-05-31 MED ORDER — ONDANSETRON HCL 4 MG/2ML IJ SOLN
4.0000 mg | Freq: Four times a day (QID) | INTRAMUSCULAR | Status: DC | PRN
  Administered 2011-06-01: 4 mg via INTRAVENOUS

## 2011-05-31 MED ORDER — PNEUMOCOCCAL VAC POLYVALENT 25 MCG/0.5ML IJ INJ
0.5000 mL | INJECTION | Freq: Once | INTRAMUSCULAR | Status: DC
  Filled 2011-05-31: qty 0.5

## 2011-05-31 MED ORDER — DOCUSATE SODIUM 100 MG PO CAPS
100.0000 mg | ORAL_CAPSULE | Freq: Two times a day (BID) | ORAL | Status: DC
  Administered 2011-06-01 – 2011-06-02 (×4): 100 mg via ORAL
  Filled 2011-05-31 (×4): qty 1

## 2011-05-31 MED ORDER — CEFAZOLIN SODIUM 1-5 GM-% IV SOLN
1.0000 g | Freq: Three times a day (TID) | INTRAVENOUS | Status: AC
  Administered 2011-05-31 – 2011-06-01 (×3): 1 g via INTRAVENOUS
  Filled 2011-05-31 (×3): qty 50

## 2011-05-31 MED ORDER — FENTANYL CITRATE 0.05 MG/ML IJ SOLN
INTRAMUSCULAR | Status: DC | PRN
  Administered 2011-05-31 (×2): 25 ug via INTRAVENOUS
  Administered 2011-05-31 (×2): 50 ug via INTRAVENOUS
  Administered 2011-05-31: 25 ug via INTRAVENOUS
  Administered 2011-05-31: 50 ug via INTRAVENOUS
  Administered 2011-05-31: 25 ug via INTRAVENOUS

## 2011-05-31 MED ORDER — SODIUM CHLORIDE 0.9 % IR SOLN
Status: DC | PRN
  Administered 2011-05-31: 3000 mL

## 2011-05-31 MED ORDER — SENNOSIDES-DOCUSATE SODIUM 8.6-50 MG PO TABS: 1.0000 | ORAL_TABLET | Freq: Every evening | ORAL | Status: DC | PRN

## 2011-05-31 MED ORDER — LACTATED RINGERS IV SOLN
INTRAVENOUS | Status: DC
  Administered 2011-05-31: 50 mL/h via INTRAVENOUS
  Administered 2011-05-31: 11:00:00 via INTRAVENOUS

## 2011-05-31 MED ORDER — ONDANSETRON HCL 4 MG PO TABS: 4.0000 mg | ORAL_TABLET | Freq: Four times a day (QID) | ORAL | Status: DC | PRN

## 2011-05-31 MED ORDER — WARFARIN SODIUM 5 MG PO TABS: 5.0000 mg | ORAL_TABLET | Freq: Every day | ORAL | Status: DC

## 2011-05-31 MED ORDER — FENTANYL CITRATE 0.05 MG/ML IJ SOLN
50.0000 ug | INTRAMUSCULAR | Status: DC | PRN
  Administered 2011-05-31: 100 ug via INTRAVENOUS

## 2011-05-31 MED ORDER — POTASSIUM CHLORIDE IN NACL 20-0.9 MEQ/L-% IV SOLN
INTRAVENOUS | Status: DC
  Administered 2011-05-31 – 2011-06-01 (×2): via INTRAVENOUS
  Filled 2011-05-31 (×5): qty 1000

## 2011-05-31 MED ORDER — MIDAZOLAM HCL 2 MG/2ML IJ SOLN
1.0000 mg | INTRAMUSCULAR | Status: DC | PRN
  Administered 2011-05-31: 2 mg via INTRAVENOUS

## 2011-05-31 MED ORDER — ACETAMINOPHEN 650 MG RE SUPP: 650.0000 mg | Freq: Four times a day (QID) | RECTAL | Status: DC | PRN

## 2011-05-31 MED ORDER — ONDANSETRON HCL 4 MG/2ML IJ SOLN
INTRAMUSCULAR | Status: DC | PRN
  Administered 2011-05-31: 4 mg via INTRAVENOUS

## 2011-05-31 MED ORDER — ACETAMINOPHEN 10 MG/ML IV SOLN
INTRAVENOUS | Status: AC
  Filled 2011-05-31: qty 100

## 2011-05-31 MED ORDER — DIPHENHYDRAMINE HCL 12.5 MG/5ML PO ELIX: 12.5000 mg | ORAL_SOLUTION | Freq: Four times a day (QID) | ORAL | Status: DC | PRN

## 2011-05-31 MED ORDER — PROPOFOL 10 MG/ML IV EMUL
INTRAVENOUS | Status: DC | PRN
  Administered 2011-05-31: 50 mg via INTRAVENOUS
  Administered 2011-05-31: 150 mg via INTRAVENOUS

## 2011-05-31 MED ORDER — ENOXAPARIN SODIUM 30 MG/0.3ML ~~LOC~~ SOLN
30.0000 mg | Freq: Two times a day (BID) | SUBCUTANEOUS | Status: DC
  Administered 2011-06-01 – 2011-06-02 (×3): 30 mg via SUBCUTANEOUS
  Filled 2011-05-31 (×5): qty 0.3

## 2011-05-31 MED ORDER — ENOXAPARIN SODIUM 30 MG/0.3ML ~~LOC~~ SOLN: 30.0000 mg | Freq: Two times a day (BID) | SUBCUTANEOUS | Status: DC

## 2011-05-31 MED ORDER — MIDAZOLAM HCL 5 MG/5ML IJ SOLN
INTRAMUSCULAR | Status: DC | PRN
  Administered 2011-05-31: 1 mg via INTRAVENOUS

## 2011-05-31 MED ORDER — MORPHINE SULFATE (PF) 1 MG/ML IV SOLN
INTRAVENOUS | Status: DC
  Administered 2011-05-31: 10 mg via INTRAVENOUS
  Administered 2011-05-31 – 2011-06-01 (×3): via INTRAVENOUS
  Administered 2011-06-01: 16.2 mg via INTRAVENOUS
  Administered 2011-06-01: 22.5 mg via INTRAVENOUS
  Administered 2011-06-01: 8 mg via INTRAVENOUS
  Filled 2011-05-31 (×2): qty 25

## 2011-05-31 MED ORDER — MENTHOL 3 MG MT LOZG: 1.0000 | LOZENGE | OROMUCOSAL | Status: DC | PRN

## 2011-05-31 MED ORDER — PHENOL 1.4 % MT LIQD: 1.0000 | OROMUCOSAL | Status: DC | PRN

## 2011-05-31 MED ORDER — PATIENT'S GUIDE TO USING COUMADIN BOOK
Freq: Once | Status: AC
  Administered 2011-05-31: 17:00:00
  Filled 2011-05-31: qty 1

## 2011-05-31 MED ORDER — SODIUM CHLORIDE 0.9 % IR SOLN
Status: DC | PRN
  Administered 2011-05-31: 1000 mL

## 2011-05-31 MED ORDER — METHOCARBAMOL 500 MG PO TABS: 500.0000 mg | ORAL_TABLET | Freq: Four times a day (QID) | ORAL | Status: AC | PRN

## 2011-05-31 MED ORDER — FLEET ENEMA 7-19 GM/118ML RE ENEM: 1.0000 | ENEMA | Freq: Once | RECTAL | Status: AC | PRN

## 2011-05-31 MED ORDER — WARFARIN VIDEO: Freq: Once | Status: DC

## 2011-05-31 MED ORDER — MORPHINE SULFATE 4 MG/ML IJ SOLN
INTRAMUSCULAR | Status: DC | PRN
  Administered 2011-05-31: 4 mg via SUBCUTANEOUS

## 2011-05-31 MED ORDER — ARIPIPRAZOLE 10 MG PO TABS
10.0000 mg | ORAL_TABLET | Freq: Every day | ORAL | Status: DC
  Administered 2011-06-01 – 2011-06-02 (×2): 10 mg via ORAL
  Filled 2011-05-31 (×2): qty 1

## 2011-05-31 MED ORDER — WARFARIN SODIUM 10 MG PO TABS
10.0000 mg | ORAL_TABLET | Freq: Once | ORAL | Status: AC
  Administered 2011-05-31: 10 mg via ORAL
  Filled 2011-05-31: qty 1

## 2011-05-31 MED ORDER — LABETALOL HCL 5 MG/ML IV SOLN: 5.0000 mg | INTRAVENOUS | Status: DC | PRN

## 2011-05-31 MED ORDER — OXYCODONE-ACETAMINOPHEN 5-325 MG PO TABS
1.0000 | ORAL_TABLET | ORAL | Status: DC | PRN
  Administered 2011-06-01 (×2): 2 via ORAL
  Administered 2011-06-01: 1 via ORAL
  Administered 2011-06-02 (×3): 2 via ORAL
  Filled 2011-05-31 (×6): qty 2

## 2011-05-31 MED ORDER — METOCLOPRAMIDE HCL 10 MG PO TABS: 5.0000 mg | ORAL_TABLET | Freq: Three times a day (TID) | ORAL | Status: DC | PRN

## 2011-05-31 MED ORDER — HYDROMORPHONE HCL PF 1 MG/ML IJ SOLN
0.2500 mg | INTRAMUSCULAR | Status: DC | PRN
  Administered 2011-05-31 (×2): 0.5 mg via INTRAVENOUS

## 2011-05-31 SURGICAL SUPPLY — 61 items
BANDAGE ESMARK 6X9 LF (GAUZE/BANDAGES/DRESSINGS) ×1 IMPLANT
BLADE SAG 18X100X1.27 (BLADE) ×4 IMPLANT
BNDG CMPR 9X6 STRL LF SNTH (GAUZE/BANDAGES/DRESSINGS) ×1
BNDG ESMARK 6X9 LF (GAUZE/BANDAGES/DRESSINGS) ×2
BOOTCOVER CLEANROOM LRG (PROTECTIVE WEAR) ×4 IMPLANT
BOWL SMART MIX CTS (DISPOSABLE) ×2 IMPLANT
CEMENT BONE SIMPLEX SPEEDSET (Cement) ×4 IMPLANT
CLOTH BEACON ORANGE TIMEOUT ST (SAFETY) ×2 IMPLANT
COVER BACK TABLE 24X17X13 BIG (DRAPES) ×2 IMPLANT
COVER SURGICAL LIGHT HANDLE (MISCELLANEOUS) ×2 IMPLANT
CUFF TOURNIQUET SINGLE 34IN LL (TOURNIQUET CUFF) ×2 IMPLANT
DRAPE EXTREMITY T 121X128X90 (DRAPE) ×2 IMPLANT
DRAPE PROXIMA HALF (DRAPES) ×2 IMPLANT
DRAPE U-SHAPE 47X51 STRL (DRAPES) ×2 IMPLANT
DRSG PAD ABDOMINAL 8X10 ST (GAUZE/BANDAGES/DRESSINGS) ×2 IMPLANT
DURAPREP 26ML APPLICATOR (WOUND CARE) ×2 IMPLANT
ELECT CAUTERY BLADE 6.4 (BLADE) ×2 IMPLANT
ELECT REM PT RETURN 9FT ADLT (ELECTROSURGICAL) ×2
ELECTRODE REM PT RTRN 9FT ADLT (ELECTROSURGICAL) ×1 IMPLANT
EVACUATOR 1/8 PVC DRAIN (DRAIN) ×2 IMPLANT
FACESHIELD LNG OPTICON STERILE (SAFETY) ×2 IMPLANT
GAUZE XEROFORM 5X9 LF (GAUZE/BANDAGES/DRESSINGS) ×2 IMPLANT
GLOVE BIO SURGEON STRL SZ 6.5 (GLOVE) ×1 IMPLANT
GLOVE BIOGEL PI IND STRL 8 (GLOVE) ×1 IMPLANT
GLOVE BIOGEL PI INDICATOR 8 (GLOVE) ×2
GLOVE ORTHO TXT STRL SZ7.5 (GLOVE) ×3 IMPLANT
GOWN PREVENTION PLUS XLARGE (GOWN DISPOSABLE) ×4 IMPLANT
GOWN STRL NON-REIN LRG LVL3 (GOWN DISPOSABLE) ×4 IMPLANT
GOWN STRL REIN 2XL XLG LVL4 (GOWN DISPOSABLE) ×2 IMPLANT
HANDPIECE INTERPULSE COAX TIP (DISPOSABLE) ×2
IMMOBILIZER KNEE 22 UNIV (SOFTGOODS) ×2 IMPLANT
IMMOBILIZER KNEE 24 THIGH 36 (MISCELLANEOUS) IMPLANT
IMMOBILIZER KNEE 24 UNIV (MISCELLANEOUS)
KIT BASIN OR (CUSTOM PROCEDURE TRAY) ×2 IMPLANT
KIT ROOM TURNOVER OR (KITS) ×2 IMPLANT
MANIFOLD NEPTUNE II (INSTRUMENTS) ×2 IMPLANT
NS IRRIG 1000ML POUR BTL (IV SOLUTION) ×2 IMPLANT
PACK TOTAL JOINT (CUSTOM PROCEDURE TRAY) ×2 IMPLANT
PAD ARMBOARD 7.5X6 YLW CONV (MISCELLANEOUS) ×4 IMPLANT
PAD CAST 4YDX4 CTTN HI CHSV (CAST SUPPLIES) ×1 IMPLANT
PADDING CAST ABS 6INX4YD NS (CAST SUPPLIES) ×1
PADDING CAST ABS COTTON 6X4 NS (CAST SUPPLIES) IMPLANT
PADDING CAST COTTON 4X4 STRL (CAST SUPPLIES) ×2
PADDING CAST COTTON 6X4 STRL (CAST SUPPLIES) ×2 IMPLANT
RUBBERBAND STERILE (MISCELLANEOUS) ×2 IMPLANT
SET HNDPC FAN SPRY TIP SCT (DISPOSABLE) ×1 IMPLANT
SPONGE GAUZE 4X4 12PLY (GAUZE/BANDAGES/DRESSINGS) ×2 IMPLANT
STAPLER VISISTAT 35W (STAPLE) ×2 IMPLANT
STEM CEMENTED (Stem) ×1 IMPLANT
SUCTION FRAZIER TIP 10 FR DISP (SUCTIONS) ×2 IMPLANT
SUT VIC AB 1 CTX 36 (SUTURE) ×4
SUT VIC AB 1 CTX36XBRD ANBCTR (SUTURE) ×2 IMPLANT
SUT VIC AB 2-0 CT1 27 (SUTURE) ×4
SUT VIC AB 2-0 CT1 TAPERPNT 27 (SUTURE) ×2 IMPLANT
SYR 30ML LL (SYRINGE) ×2 IMPLANT
SYR 30ML SLIP (SYRINGE) ×2 IMPLANT
TOWEL OR 17X24 6PK STRL BLUE (TOWEL DISPOSABLE) ×2 IMPLANT
TOWEL OR 17X26 10 PK STRL BLUE (TOWEL DISPOSABLE) ×2 IMPLANT
TRAY FOLEY CATH 14FR (SET/KITS/TRAYS/PACK) ×2 IMPLANT
WATER STERILE IRR 1000ML POUR (IV SOLUTION) ×6 IMPLANT
triathlon tibial baseplate  size 7 ×1 IMPLANT

## 2011-05-31 NOTE — Interval H&P Note (Signed)
History and Physical Interval Note:  05/31/2011 8:32 AM  Raymond [name]  has presented today for surgery, with the diagnosis of DJD RIGHT KNEE  The various methods of treatment have been discussed with the patient and family. After consideration of risks, benefits and other options for treatment, the patient has consented to  Procedure(s) (LRB): TOTAL KNEE ARTHROPLASTY (Right) as a surgical intervention .  The patients' history has been reviewed, patient examined, no change in status, stable for surgery.  I have reviewed the patients' chart and labs.  Questions were answered to the patient's satisfaction.     [name] F

## 2011-05-31 NOTE — Progress Notes (Signed)
Re: CPM, Ortho techs were at bedside to apply CPM.  Zonia Kief, PA stated to hold off on CPM until Dr. Eulah Pont notified of xray results and decides what to do.

## 2011-05-31 NOTE — Anesthesia Preprocedure Evaluation (Signed)
Anesthesia Evaluation  Patient identified by MRN, date of birth, ID band Patient awake    Reviewed: Allergy & Precautions, H&P , NPO status , Patient's Chart, lab work & pertinent test results, reviewed documented beta blocker date and time   Airway Mallampati: II TM Distance: >3 FB Neck ROM: Full    Dental  (+) Poor Dentition and Dental Advisory Given   Pulmonary Current Smoker,  breath sounds clear to auscultation  Pulmonary exam normal       Cardiovascular hypertension, Pt. on medications Rhythm:Regular Rate:Normal     Neuro/Psych negative neurological ROS     GI/Hepatic negative GI ROS, Neg liver ROS,   Endo/Other  negative endocrine ROS  Renal/GU Renal InsufficiencyRenal disease     Musculoskeletal   Abdominal   Peds  Hematology negative hematology ROS (+)   Anesthesia Other Findings   Reproductive/Obstetrics                           Anesthesia Physical Anesthesia Plan  ASA: II  Anesthesia Plan: General   Post-op Pain Management:    Induction: Intravenous  Airway Management Planned: LMA  Additional Equipment:   Intra-op Plan:   Post-operative Plan: Extubation in OR  Informed Consent: I have reviewed the patients History and Physical, chart, labs and discussed the procedure including the risks, benefits and alternatives for the proposed anesthesia with the patient or authorized representative who has indicated his/her understanding and acceptance.   Dental advisory given  Plan Discussed with: CRNA, Anesthesiologist and Surgeon  Anesthesia Plan Comments:         Anesthesia Quick Evaluation

## 2011-05-31 NOTE — Progress Notes (Signed)
Raymond [name] stated it was okay to continue to apply cpm and transfer to patient's room.

## 2011-05-31 NOTE — Progress Notes (Signed)
ANTICOAGULATION CONSULT NOTE - Initial Consult  Pharmacy Consult for Coumadin Indication: VTE prophylaxis  Allergies  Allergen Reactions  . Aspirin Nausea And Vomiting    Vital Signs: Temp: 98.1 F (36.7 C) (05/22 1530) Temp src: Oral (05/22 0759) BP: 117/72 mmHg (05/22 1530) Pulse Rate: 77  (05/22 1530)  Labs: No results found for this basename: HGB:2,HCT:3,PLT:3,APTT:3,LABPROT:3,INR:3,HEPARINUNFRC:3,CREATININE:3,CKTOTAL:3,CKMB:3,TROPONINI:3 in the last 72 hours  The CrCl is unknown because both a height and weight (above a minimum accepted value) are required for this calculation.   Medical History: Past Medical History  Diagnosis Date  . Hypertension   . Depression     Medications:  Prescriptions prior to admission  Medication Sig Dispense Refill  . amLODipine (NORVASC) 10 MG tablet Take 10 mg by mouth daily.      . ARIPiprazole (ABILIFY) 10 MG tablet Take 10 mg by mouth daily.      Marland Kitchen buPROPion (WELLBUTRIN) 75 MG tablet Take 75 mg by mouth daily.      Marland Kitchen HYDROcodone-acetaminophen (VICODIN) 5-500 MG per tablet Take 1 tablet by mouth every 4 (four) hours as needed. For pain        Assessment: 51 y/o male patient admitted s/p R TKA requiring coumadin for dvt px. Baseline INR obtained. Lovenox 30mg  q12h ordered. Coumadin score =9.  Goal of Therapy:  INR 2-3 Monitor platelets by anticoagulation protocol: Yes   Plan:  Coumadin 10mg  today, f/u daily protime and d/c lovenox when INR>2.0.  Verlene Mayer, PharmD, BCPS Pager [phone] 05/31/2011,4:34 PM

## 2011-05-31 NOTE — Anesthesia Postprocedure Evaluation (Signed)
  Anesthesia Post-op Note  Patient: Raymond [name]  Procedure(s) Performed: Procedure(s) (LRB): TOTAL KNEE ARTHROPLASTY (Right)  Patient Location: PACU  Anesthesia Type: GA combined with regional for post-op pain  Level of Consciousness: awake and alert   Airway and Oxygen Therapy: Patient Spontanous Breathing and Patient connected to face mask oxygen  Post-op Pain: mild  Post-op Assessment: Post-op Vital signs reviewed, Patient's Cardiovascular Status Stable, Respiratory Function Stable, Patent Airway and No signs of Nausea or vomiting  Post-op Vital Signs: Reviewed and stable  Complications: No apparent anesthesia complications

## 2011-05-31 NOTE — Transfer of Care (Signed)
Immediate Anesthesia Transfer of Care Note  Patient: Raymond [name]  Procedure(s) Performed: Procedure(s) (LRB): TOTAL KNEE ARTHROPLASTY (Right)  Patient Location: PACU  Anesthesia Type: General  Level of Consciousness: awake, oriented and patient cooperative  Airway & Oxygen Therapy: Patient Spontanous Breathing and Patient connected to face mask oxygen  Post-op Assessment: Report given to PACU RN and Post -op Vital signs reviewed and stable  Post vital signs: Reviewed and stable  Complications: No apparent anesthesia complications

## 2011-05-31 NOTE — Preoperative (Signed)
Beta Blockers   Reason not to administer Beta Blockers:Not Applicable 

## 2011-05-31 NOTE — Anesthesia Procedure Notes (Signed)
Anesthesia Regional Block:  Femoral nerve block  Pre-Anesthetic Checklist: ,, timeout performed, Correct Patient, Correct Site, Correct Laterality, Correct Procedure,, site marked, risks and benefits discussed, Surgical consent,  Pre-op evaluation,  At surgeon's request and post-op pain management  Laterality: Right  Prep: chloraprep       Needles:  Injection technique: Single-shot  Needle Type: Echogenic Stimulator Needle     Needle Length: 5cm 5 cm Needle Gauge: 22 and 22 G    Additional Needles:  Procedures: ultrasound guided and nerve stimulator Femoral nerve block  Nerve Stimulator or Paresthesia:  Response: quadraceps contraction, 0.45 mA,   Additional Responses:   Narrative:  Start time: 05/31/2011 9:37 AM End time: 05/31/2011 9:46 AM Injection made incrementally with aspirations every 5 mL.  Performed by: Personally  Anesthesiologist: Halford Decamp, MD  Additional Notes: Functioning IV was confirmed and monitors were applied.  A 50mm 22ga Arrow echogenic stimulator needle was used. Sterile prep and drape,hand hygiene and sterile gloves were used. Ultrasound guidance: relevant anatomy identified, needle position confirmed, local anesthetic spread visualized around nerve(s)., vascular puncture avoided.  Image printed for medical record. Negative aspiration and negative test dose prior to incremental administration of local anesthetic. The patient tolerated the procedure well.    Femoral nerve block

## 2011-05-31 NOTE — Progress Notes (Signed)
Orthopedic Tech Progress Note Patient Details:  Raymond [name] 1960-03-18 [id number]  CPM Right Knee CPM Right Knee: On Right Knee Flexion (Degrees): 0  Right Knee Extension (Degrees): 60  Additional Comments: trapepe bar   Cammer, Mickie Bail 05/31/2011, 2:44 PM

## 2011-05-31 NOTE — Brief Op Note (Signed)
05/31/2011  12:19 PM  PATIENT:  Junie Panning  51 y.o. male  PRE-OPERATIVE DIAGNOSIS:  Degenerative joint disease right knee  POST-OPERATIVE DIAGNOSIS:  Degenerative joint disease right knee  PROCEDURE:  Procedure(s) (LRB): TOTAL KNEE ARTHROPLASTY (Right)  SURGEON:  Surgeon(s) and Role:    * Loreta Ave, MD - Primary  PHYSICIAN ASSISTANT: Zonia Kief M     ANESTHESIA:   regional and general  EBL:  Total I/O In: 1400 [I.V.:1400] Out: 350 [Urine:350]  SPECIMEN:  No Specimen    COUNTS:  YES  TOURNIQUET:   Total Tourniquet Time Documented: Thigh (Right) - 93 minutes   PATIENT DISPOSITION:  PACU - hemodynamically stable.

## 2011-05-31 NOTE — Progress Notes (Signed)
Dr. Bonnielee Haff called to state that the medial compartment of Mr. Rajan post op Right knee xray looked too small.  I paged Dr. Eulah Pont.  Zonia Kief, PA was sitting at the computer.  I informed him of situation and that Mr. Gamel knee dressing was oozing.  He stated he would call Dr. Bonnielee Haff and Dr. Eulah Pont.  He also gave me an order to unclamp the hemovac.

## 2011-06-01 ENCOUNTER — Encounter (HOSPITAL_COMMUNITY): Payer: Self-pay | Admitting: Orthopedic Surgery

## 2011-06-01 LAB — CBC
Hemoglobin: 12.6 g/dL — ABNORMAL LOW (ref 13.0–17.0)
MCH: 27.5 pg (ref 26.0–34.0)
RBC: 4.58 MIL/uL (ref 4.22–5.81)
WBC: 6.8 10*3/uL (ref 4.0–10.5)

## 2011-06-01 LAB — BASIC METABOLIC PANEL
CO2: 28 mEq/L (ref 19–32)
Calcium: 8.6 mg/dL (ref 8.4–10.5)
Chloride: 102 mEq/L (ref 96–112)
Glucose, Bld: 110 mg/dL — ABNORMAL HIGH (ref 70–99)
Potassium: 3.6 mEq/L (ref 3.5–5.1)
Sodium: 138 mEq/L (ref 135–145)

## 2011-06-01 LAB — PROTIME-INR: Prothrombin Time: 13.8 seconds (ref 11.6–15.2)

## 2011-06-01 MED ORDER — WARFARIN SODIUM 10 MG PO TABS
10.0000 mg | ORAL_TABLET | Freq: Once | ORAL | Status: AC
  Administered 2011-06-02: 10 mg via ORAL
  Filled 2011-06-01 (×2): qty 1

## 2011-06-01 NOTE — Progress Notes (Addendum)
Subjective: 1 Day Post-Op Procedure(s) (LRB): TOTAL KNEE ARTHROPLASTY (Right) Patient reports pain as 7 on 0-10 scale.    Objective: Vital signs in last 24 hours: Temp:  [98 F (36.7 C)-99.8 F (37.7 C)] 99.1 F (37.3 C) (05/23 0524) Pulse Rate:  [65-96] 74  (05/23 0524) Resp:  [13-18] 15  (05/23 0524) BP: (112-157)/(72-104) 112/72 mmHg (05/23 0524) SpO2:  [95 %-100 %] 97 % (05/23 0524) Weight:  [87.091 kg (192 lb)] 87.091 kg (192 lb) (05/23 0350)  Intake/Output from previous day: 05/22 0701 - 05/23 0700 In: 2890 [P.O.:840; I.V.:2000; IV Piggyback:50] Out: 3150 [Urine:2375; Drains:775] Intake/Output this shift:     Basename 06/01/11 0457  HGB 12.6*    Basename 06/01/11 0457  WBC 6.8  RBC 4.58  HCT 39.2  PLT 162    Basename 06/01/11 0457  NA 138  K 3.6  CL 102  CO2 28  BUN 6  CREATININE 1.19  GLUCOSE 110*  CALCIUM 8.6    Basename 06/01/11 0457  LABPT --  INR 1.04    Neurovascular intact Dorsiflexion/Plantar flexion intact Incision: dressing C/D/I Compartment soft Moderate bleed through on dressing has stabilized; no further bleeding at this time  Assessment/Plan: 1 Day Post-Op Procedure(s) (LRB): TOTAL KNEE ARTHROPLASTY (Right) Advance diet Up with therapy Plan for discharge tomorrow  Sebastin Perlmutter B 06/01/2011, 7:18 AM

## 2011-06-01 NOTE — Progress Notes (Signed)
Physical Therapy Treatment Note   06/01/11 1432  PT Visit Information  Last PT Received On 06/01/11  Assistance Needed +1  PT Time Calculation  PT Start Time 1432  PT Stop Time 1503  PT Time Calculation (min) 31 min  Subjective Data  Subjective Pt received sitting up in chair with with reports of "My knee is sore."  Precautions  Precautions Knee  Required Braces or Orthoses Knee Immobilizer - Right  Knee Immobilizer - Right On when out of bed or walking  Restrictions  RLE Weight Bearing WBAT  Cognition  Overall Cognitive Status Appears within functional limits for tasks assessed/performed  Arousal/Alertness Awake/alert  Orientation Level Oriented X4 / Intact  Behavior During Session [facility] for tasks performed  Cognition - Other Comments mild lethargy due to pain medicine  Bed Mobility  Bed Mobility Sit to Supine  Sit to Supine 4: Min assist;HOB flat  Details for Bed Mobility Assistance assist for R LE management  Transfers  Transfers Sit to Stand;Stand to Sit  Sit to Stand 4: Min guard;With upper extremity assist;From chair/3-in-1  Stand to Sit 4: Min guard;With upper extremity assist;With armrests;To chair/3-in-1  Details for Transfer Assistance v/c's for safety and hand placement  Ambulation/Gait  Ambulation/Gait Assistance 4: Min guard  Ambulation Distance (Feet) 75 Feet  Assistive device Rolling walker  Ambulation/Gait Assistance Details pt with improved R LE WBing, con't to require increased time  Gait Pattern Step-to pattern;Decreased stance time - right;Antalgic  Exercises  Exercises (passive R terminal knee ext stretch)  PT - End of Session  Equipment Utilized During Treatment Gait belt;Right knee immobilizer  Activity Tolerance Patient limited by pain  Patient left in bed;in CPM;with call bell/phone within reach;with family/visitor present (CPM set 0-70, ortho tech called to re-adjust for proper fit)  Nurse Communication Mobility status  PT - Assessment/Plan    Comments on Treatment Session Pt with improvement this PM. Will assess stair negotiation tomorrow AM prior to d/c.  PT Plan Discharge plan remains appropriate;Frequency remains appropriate  PT Frequency 7X/week  Follow Up Recommendations Home health PT;Supervision/Assistance - 24 hour  Acute Rehab PT Goals  PT Goal: Sit to Supine/Side - Progress Progressing toward goal  PT Goal: Sit to Stand - Progress Progressing toward goal  PT Goal: Ambulate - Progress Progressing toward goal  PT Goal: Perform Home Exercise Program - Progress Progressing toward goal    Pain: 7/10 R knee pain s/p amb  Lewis Shock, PT, DPT Pager #: [phone] Office #: [phone]

## 2011-06-01 NOTE — Evaluation (Signed)
Occupational Therapy Evaluation Patient Details Name: Raymond [name] MRN: [id number] DOB: 1960-06-13 Today's Date: 06/01/2011 Time: 4098-1191 OT Time Calculation (min): 30 min  OT Assessment / Plan / Recommendation Clinical Impression  Pt s/p Rt TKA and presents with generalized weakness, pain and overall decreased indepedence with ADLs. Pt will benefit from skilled OT in the acute setting followed by HHOT at d/c to maximize I with ADL and ADL mobility.     OT Assessment  Patient needs continued OT Services    Follow Up Recommendations  Home health OT;Supervision/Assistance - 24 hour    Barriers to Discharge      Equipment Recommendations   (may need tub/shower seat)    Recommendations for Other Services    Frequency  Min 2X/week    Precautions / Restrictions Precautions Precautions: Knee Required Braces or Orthoses: Knee Immobilizer - Right Knee Immobilizer - Right: On when out of bed or walking Restrictions Weight Bearing Restrictions: Yes RLE Weight Bearing: Weight bearing as tolerated   Pertinent Vitals/Pain Reports 5/10 knee pain; PCA encouraged    ADL  Eating/Feeding: Simulated;Independent Where Assessed - Eating/Feeding: Edge of bed Grooming: Wash/dry hands;Simulated Where Assessed - Grooming: Supported standing Upper Body Bathing: Simulated;Supervision/safety;Set up Where Assessed - Upper Body Bathing: Unsupported sitting Lower Body Bathing: Simulated;Moderate assistance Where Assessed - Lower Body Bathing: Supported sit to stand Upper Body Dressing: Simulated;Set up;Supervision/safety Where Assessed - Upper Body Dressing: Unsupported sitting Lower Body Dressing: Simulated;Moderate assistance Where Assessed - Lower Body Dressing: Sopported sit to stand Toilet Transfer: Simulated;Minimal assistance Toilet Transfer Method: Sit to stand Equipment Used: Gait belt;Rolling walker Transfers/Ambulation Related to ADLs: Min A with RW ambulation EOB to chair ADL  Comments: Pt doing very well.    OT Diagnosis: Generalized weakness;Acute pain  OT Problem List: Decreased range of motion;Decreased activity tolerance;Decreased knowledge of use of DME or AE;Decreased knowledge of precautions;Pain OT Treatment Interventions: Self-care/ADL training;DME and/or AE instruction;Therapeutic activities;Balance training;Patient/family education   OT Goals Acute Rehab OT Goals OT Goal Formulation: With patient Time For Goal Achievement: 06/08/11 Potential to Achieve Goals: Good ADL Goals Pt Will Perform Grooming: Independently;Standing at sink ADL Goal: Grooming - Progress: Goal set today Pt Will Perform Lower Body Dressing: with min assist;Sit to stand from chair;Sit to stand from bed ADL Goal: Lower Body Dressing - Progress: Goal set today Pt Will Transfer to Toilet: with modified independence;with DME;Ambulation;3-in-1 ADL Goal: Toilet Transfer - Progress: Goal set today Pt Will Perform Toileting - Clothing Manipulation: Independently;Standing ADL Goal: Toileting - Clothing Manipulation - Progress: Goal set today Pt Will Perform Tub/Shower Transfer: Tub transfer;with min assist;with DME ADL Goal: Tub/Shower Transfer - Progress: Goal set today  Visit Information  Last OT Received On: 06/01/11 Assistance Needed: +1    Subjective Data  Subjective: I'd really like to change this gown Patient Stated Goal: Return home with wife   Prior Functioning  Home Living Lives With: Spouse Available Help at Discharge: Family;Available 24 hours/day Type of Home: Apartment Home Access: Ramped entrance (has stairs[see below] and handicapped ramped including 3 large platform) Entrance Stairs-Number of Steps: 5 steps to get into building then 2 flights of 8 steps to apt Entrance Stairs-Rails:  (has both but steps are wide apart) Home Layout: Two level;1/2 bath on main level;Bed/bath upstairs Alternate Level Stairs-Number of Steps: 14 Alternate Level Stairs-Rails: Can  reach both Bathroom Shower/Tub: Engineer, manufacturing systems: Standard Bathroom Accessibility: Yes How Accessible: Accessible via walker Home Adaptive Equipment: Bedside commode/3-in-1;Walker - rolling Prior Function Level of  Independence: Independent Able to Take Stairs?: Yes Driving: Yes Vocation: On disability Comments: hasn't worked in 2 .5 years Communication Communication: No difficulties Dominant Hand: Right    Cognition  Overall Cognitive Status: Appears within functional limits for tasks assessed/performed Arousal/Alertness: Awake/alert Orientation Level: Oriented X4 / Intact Behavior During Session: WFL for tasks performed Cognition - Other Comments: mild lethargy due to pain medicine    Extremity/Trunk Assessment Right Upper Extremity Assessment RUE ROM/Strength/Tone: Within functional levels RUE Sensation: WFL - Light Touch RUE Coordination: WFL - gross/fine motor Left Upper Extremity Assessment LUE ROM/Strength/Tone: Within functional levels LUE Sensation: WFL - Light Touch LUE Coordination: WFL - gross/fine motor Right Lower Extremity Assessment RLE ROM/Strength/Tone: Deficits;Due to pain RLE ROM/Strength/Tone Deficits: initiate R quad set, AA knee ROM to 50 deg Left Lower Extremity Assessment LLE ROM/Strength/Tone: Within functional levels Trunk Assessment Trunk Assessment: Normal   Mobility Bed Mobility Bed Mobility: Not assessed (pt received sitting up in chair) Transfers Transfers: Sit to Stand;Stand to Sit Sit to Stand: 4: Min guard;With upper extremity assist;From chair/3-in-1 Stand to Sit: 4: Min guard;With upper extremity assist;With armrests;To chair/3-in-1 Details for Transfer Assistance: v/c's for hand placement   Exercise    Balance    End of Session OT - End of Session Equipment Utilized During Treatment: Gait belt;Right knee immobilizer Activity Tolerance: Patient tolerated treatment well Patient left: in chair;with call bell/phone  within reach;with family/visitor present;with nursing in room Nurse Communication: Mobility status CPM Right Knee Additional Comments: trapepe bar   Karlei Waldo 06/01/2011, 9:55 AM

## 2011-06-01 NOTE — Progress Notes (Signed)
Physical Therapy Evaluation Note  Past Medical History  Diagnosis Date  . Hypertension   . Depression     Past Surgical History  Procedure Date  . Joint replacement     06/2010  . Knee arthroplasty 05/31/2011    total knee  . Appendectomy      06/01/11 0914  PT Visit Information  Last PT Received On 06/08/11  Assistance Needed +1  PT Time Calculation  PT Start Time 0914  PT Stop Time 0943  PT Time Calculation (min) 29 min  Subjective Data  Subjective Pt received sitting up in chair with reports of pain at 5/10. Patient using PCA pump  Patient Stated Goal home  Precautions  Precautions Knee  Required Braces or Orthoses Knee Immobilizer - Right  Knee Immobilizer - Right On when out of bed or walking  Restrictions  Weight Bearing Restrictions Yes  RLE Weight Bearing WBAT  Home Living  Lives With Spouse  Available Help at Discharge Family;Available 24 hours/day  Type of Home Apartment  Home Access Ramped entrance (has stairs or handicapped ramped including 3 large platform steps  Entrance Stairs-Number of Steps 5 steps to get into building then 2 flights of 8 steps to apt  Entrance Stairs-Rails (has both but steps are wide apart)  Home Layout Two level;1/2 bath on main level;Bed/bath upstairs  Alternate Level Stairs-Number of Steps 14  Alternate Level Stairs-Rails Can reach both  Database administrator Yes  How Accessible Accessible via walker  Home Adaptive Equipment Bedside commode/3-in-1;Walker - rolling  Prior Function  Level of Independence Independent  Able to Take Stairs? Yes  Driving Yes  Vocation On disability  Comments hasn't worked in 2 .5 years  Geneticist, molecular No difficulties  Cognition  Overall Cognitive Status Appears within functional limits for tasks assessed/performed  Arousal/Alertness Awake/alert  Orientation Level Oriented X4 / Intact  Behavior During Session [facility]  for tasks performed  Cognition - Other Comments mild lethargy due to pain medicine  Right Upper Extremity Assessment  RUE ROM/Strength/Tone WFL  Left Upper Extremity Assessment  LUE ROM/Strength/Tone WFL  Right Lower Extremity Assessment  RLE ROM/Strength/Tone Deficits;Due to pain  RLE ROM/Strength/Tone Deficits initiate R quad set, AA knee ROM to 50 deg  Left Lower Extremity Assessment  LLE ROM/Strength/Tone WFL  Trunk Assessment  Trunk Assessment Normal  Bed Mobility  Bed Mobility Not assessed (pt received sitting up in chair)  Transfers  Transfers Sit to Stand;Stand to Sit  Sit to Stand 4: Min guard;With upper extremity assist;From chair/3-in-1  Stand to Sit 4: Min guard;With upper extremity assist;With armrests;To chair/3-in-1  Details for Transfer Assistance v/c's for hand placement  Ambulation/Gait  Ambulation/Gait Assistance 4: Min guard  Ambulation Distance (Feet) 75 Feet  Assistive device Rolling walker  Ambulation/Gait Assistance Details max encouragement to increase R LE wbing and decrease bilat UE wbing. pt reports only putting 25% of weight through it  Gait Pattern Step-to pattern;Decreased stance time - right;Antalgic  Gait velocity slow  Stairs No  Exercises  Exercises Total Joint (handout provided)  Total Joint Exercises  Ankle Circles/Pumps AROM;Both;10 reps;Seated  Quad Sets AROM;Right;10 reps;Seated (with 5 sec hold)  Heel Slides AAROM;Right;10 reps;Seated  Goniometric ROM 50 deg AA R knee flex  PT - End of Session  Equipment Utilized During Treatment Gait belt;Right knee immobilizer  Activity Tolerance Patient tolerated treatment well  Patient left in chair;with call bell/phone within reach;with family/visitor present  Nurse Communication Mobility  status  PT Assessment  Clinical Impression Statement Pt s/p R TKA presenting with increased R knee pain, decreased  RLE strength and R knee ROM. Patient tolerated tx well however remains on PCA pump. Patient  has 24/7 supervision/assist at home however has to do 14 steps to access bed/bath. Will trial steps potentially this PM, if not tomorrow AM to prep for potential d/c tomorrow.  PT Recommendation/Assessment Patient needs continued PT services  PT Problem List Decreased activity tolerance;Decreased balance;Decreased mobility;Decreased range of motion;Decreased strength  Barriers to Discharge Inaccessible home environment  PT Therapy Diagnosis  Difficulty walking;Abnormality of gait;Generalized weakness;Acute pain  PT Plan  PT Frequency 7X/week  PT Treatment/Interventions DME instruction;Gait training;Stair training;Functional mobility training;Therapeutic activities;Therapeutic exercise  PT Recommendation  Follow Up Recommendations Home health PT;Supervision/Assistance - 24 hour  Equipment Recommended None recommended by PT (equip already delivered)  Individuals Consulted  Consulted and Agree with Results and Recommendations Patient  Acute Rehab PT Goals  PT Goal Formulation With patient  Time For Goal Achievement 06/08/11  Potential to Achieve Goals Good  Pt will go Supine/Side to Sit with modified independence;with HOB 0 degrees  PT Goal: Supine/Side to Sit - Progress Goal set today  Pt will go Sit to Supine/Side with modified independence;with HOB 0 degrees  PT Goal: Sit to Supine/Side - Progress Goal set today  Pt will go Sit to Stand with modified independence;with upper extremity assist (up to RW>)  PT Goal: Sit to Stand - Progress Goal set today  Pt will Ambulate >150 feet;with modified independence;with rolling walker  PT Goal: Ambulate - Progress Goal set today  Pt will Go Up / Down Stairs Flight;with min assist;with rail(s);with crutches  PT Goal: Up/Down Stairs - Progress Goal set today  Pt will Perform Home Exercise Program Independently  PT Goal: Perform Home Exercise Program - Progress Goal set today  Written Expression  Dominant Hand Right    Pain: 5/10 R knee  pain  Lewis Shock, PT, DPT Pager #: [phone] Office #: [phone]

## 2011-06-01 NOTE — Op Note (Signed)
Raymond [name], Raymond [name] NO.:  [id number]  MEDICAL RECORD NO.:  [id number]  LOCATION:  5032                         FACILITY:  [facility]  PHYSICIAN:  Loreta Ave, M.D. DATE OF BIRTH:  1960/11/23  DATE OF PROCEDURE:  05/31/2011 DATE OF DISCHARGE:                              OPERATIVE REPORT   PREOPERATIVE DIAGNOSES:  Right knee end-stage degenerative arthritis, varus alignment.  Bone loss, medial compartment.  Previous stress fracture, proximal tibia, with persistent large cystic lesion anteromedial tibia from previous stress fracture.  Significant medial collateral ligament contracture from previous medial collateral ligament sprain and tear.  POSTOPERATIVE DIAGNOSES:  Right knee end-stage degenerative arthritis, varus alignment.  Bone loss, medial compartment.  Previous stress fracture, proximal tibia, with persistent large cystic lesion anteromedial tibia from previous stress fracture.  Significant medial collateral ligament contracture from previous medial collateral ligament sprain and tear.  PROCEDURE:  Right knee modified minimally invasive total knee replacement, Stryker Triathlon prosthesis.  Soft tissue balancing. Cemented pegged posterior stabilized #7 femoral component.  Cemented #7 tibial component 9-mm polyethylene insert.  A 12 mm x 100 mm tibial stem.  Cemented 38-mm patellar component.  Also curettage and cancellous autograft to cystic lesion of proximal tibia, anterior aspect.  SURGEON:  Loreta Ave, MD  ASSISTANT:  Genene Churn. Barry Dienes, [place], present throughout the entire case and necessary for the timely completion of the procedure.  ANESTHESIA:  General.  BLOOD LOSS:  Minimal.  SPECIMENS:  None.  CULTURES:  None.  COMPLICATIONS:  None.  DRESSINGS:  Soft compressive knee immobilizer.  DRAINS:  Hemovac x1.  TOURNIQUET TIME:  1 hour 30 minutes.  PROCEDURE:  The patient was brought to the operating room and placed on the operating  table in supine position.  After adequate anesthesia had been obtained, tourniquet applied, prepped and draped in usual sterile fashion.  Exsanguinated with elevation of Esmarch, tourniquet inflated to 350 mmHg.  Just about full extension, flexion 90.  Almost 10 degrees of varus relatively fixed.  Anterior incision above the patella down to the tibial tubercle.  Medial arthrotomy, vastus splitting, preserving quad tendon.  I took down the entire medial side.  The deep fibers of MCL released off the tibia.  I tried to free up that side as much as possible.  Remnants of menisci, cruciate ligaments, and loose bodies removed.  Distal femur exposed.  Intramedullary guide placed.  8-mm resection, 5 degrees of valgus.  Using epicondylar axis, the femur was sized, cut, and fitted for a posterior stabilized #7 component. Attention was turned to the tibia.  Extramedullary guide.  0-degree cut below the defect medially.  The defect was the anterior third on the medial side.  The remnants of previous synthetic graft were still there, but had not incorporated at all.  This extended down about a centimeter and half and a centimeter in diameter.  Fortunately, all the bone behind that was of very good quality.  Also very sclerotic behind that treated with multiple drillings in preparation for prosthesis.  Completely curetted out the cyst in front.  Very thin wall in the front, but again I did not feel this is going to  compromise the definitive component as long as we put a stem on the tibia.  Patella exposed, posterior 10 mm removed.  Drilled, sized, and fitted for a 38-mm component.  I then put the trials in place.  In order to get the knee balanced, I had to do a fractional lengthening not only of the deep, but the superficial MCL, so that at completion I felt like I had a balanced knee in flexion and extension.  He still wanted open laterally to varus stress, but I still felt like biomechanical axis  was sufficient and I did not feel releasing the medial side further was indicated.  Rotation of the tibia with several trials.  All trials removed.  Tibia was then prepared for not only the normal keel, but for a 12 mm x 100 mm stem.  With that was all complete, we then took the cancellous autograft from the femoral cuttings.  These were then packed into the defect in the tibia.  The wound was copiously irrigated with pulse lavage.  Cement prepared and placed on all components, which were firmly seated.  Polyethylene attached to the tibia with a 9-mm insert.  Patella held with a clamp. Once cement hardened, it was re-examined.  Again, although he still wanted open from stretching on the lateral side a little bit, I felt like it was nicely balanced in flexion and extension, full motion, good patellofemoral tracking, good balancing in flexion and extension. Hemovac was placed, brought out through a separate stab wound. Arthrotomy closed with #1 Vicryl, skin and subcutaneous tissue with Vicryl and staples.  Sterile compressive dressing applied.  Tourniquet deflated and removed.  Knee immobilizer applied.  Anesthesia reversed. Brought to the recovery room.  Tolerated the surgery well.  No complications.     Loreta Ave, M.D.     DFM/MEDQ  D:  06/01/2011  T:  06/01/2011  Job:  [id number]

## 2011-06-01 NOTE — Progress Notes (Signed)
ANTICOAGULATION CONSULT NOTE - Follow Up Consult  Pharmacy Consult for Coumadin Indication: VTE prophylaxis  Allergies  Allergen Reactions  . Aspirin Nausea And Vomiting    Vital Signs: Temp: 99.1 F (37.3 C) (05/23 0524) Temp src: Oral (05/23 0524) BP: 112/72 mmHg (05/23 0524) Pulse Rate: 74  (05/23 0524)  Labs:  Basename 06/01/11 0457  HGB 12.6*  HCT 39.2  PLT 162  APTT --  LABPROT 13.8  INR 1.04  HEPARINUNFRC --  CREATININE 1.19  CKTOTAL --  CKMB --  TROPONINI --    Estimated Creatinine Clearance: 75.8 ml/min (by C-G formula based on Cr of 1.19).   Medical History: Past Medical History  Diagnosis Date  . Hypertension   . Depression     Medications:  Prescriptions prior to admission  Medication Sig Dispense Refill  . amLODipine (NORVASC) 10 MG tablet Take 10 mg by mouth daily.      . ARIPiprazole (ABILIFY) 10 MG tablet Take 10 mg by mouth daily.      Marland Kitchen buPROPion (WELLBUTRIN) 75 MG tablet Take 75 mg by mouth daily.      Marland Kitchen DISCONTD: HYDROcodone-acetaminophen (VICODIN) 5-500 MG per tablet Take 1 tablet by mouth every 4 (four) hours as needed. For pain        Assessment: 51 y/o male patient POD#1 s/p R TKA requiring coumadin for dvt px. INR 1.04 today. Lovenox 30mg  q12h contiunes.  H/H 12.6/39.2, pltc 162K.  Ortho PA notes today that moderate bleed through on dressing has stabilized; no further bleeding at this time Coumadin score =9.  Goal of Therapy:  INR 2-3 Monitor platelets by anticoagulation protocol: Yes   Plan:  Coumadin 10mg  today, f/u daily protime and d/c lovenox when INR>1.8  Noah Delaine, RPh Clinical Pharmacist 06/01/2011,11:27 AM

## 2011-06-01 NOTE — Care Management Note (Signed)
    Page 1 of 2   06/01/2011     11:48:32 AM   CARE MANAGEMENT NOTE 06/01/2011  Patient:  Raymond [name], Raymond [name]   Account Number:  [id number]  Date Initiated:  05/31/2011  Documentation initiated by:  Ronny Flurry  Subjective/Objective Assessment:   TOTAL KNEE ARTHROPLASTY (Right)     Action/Plan:   follow progress w/PT for additional needs   Anticipated DC Date:  06/03/2011   Anticipated DC Plan:  HOME W HOME HEALTH SERVICES      DC Planning Services  CM consult      Choice offered to / List presented to:  C-1 Patient   DME arranged  CPM      DME agency  [employer]     HH arranged  HH-2 PT  HH-1 RN  HH-1 RN  HH-2 PT      HH agency  [facility]   Status of service:  Completed, signed off Medicare Important Message given?  NO (If response is "NO", the following Medicare IM given date fields will be blank) Date Medicare IM given:   Date Additional Medicare IM given:    Discharge Disposition:  HOME W HOME HEALTH SERVICES  Per UR Regulation:  Reviewed for med. necessity/level of care/duration of stay  If discussed at Long Length of Stay Meetings, dates discussed:    Comments:  06/01/11 11:46 Anette Guarneri RN/CM spoke with patient, has DME at home, CPM  to be delivered by [employer] [facility] PT/RN per [place]

## 2011-06-02 LAB — CBC
HCT: 34.8 % — ABNORMAL LOW (ref 39.0–52.0)
Hemoglobin: 11.2 g/dL — ABNORMAL LOW (ref 13.0–17.0)
RBC: 4.08 MIL/uL — ABNORMAL LOW (ref 4.22–5.81)
WBC: 8.2 10*3/uL (ref 4.0–10.5)

## 2011-06-02 LAB — BASIC METABOLIC PANEL
BUN: 6 mg/dL (ref 6–23)
CO2: 26 mEq/L (ref 19–32)
Chloride: 101 mEq/L (ref 96–112)
Glucose, Bld: 96 mg/dL (ref 70–99)
Potassium: 3.5 mEq/L (ref 3.5–5.1)
Sodium: 138 mEq/L (ref 135–145)

## 2011-06-02 LAB — PROTIME-INR
INR: 1.42 (ref 0.00–1.49)
Prothrombin Time: 17.6 seconds — ABNORMAL HIGH (ref 11.6–15.2)

## 2011-06-02 MED ORDER — WARFARIN SODIUM 10 MG PO TABS
10.0000 mg | ORAL_TABLET | Freq: Once | ORAL | Status: DC
  Filled 2011-06-02: qty 1

## 2011-06-02 NOTE — Progress Notes (Signed)
OT Cancellation Note  ___Treatment cancelled today due to medical issues with patient which prohibited therapy  ___ Treatment cancelled today due to patient receiving procedure or test   _x__ Treatment cancelled today due to patient's refusal to participate. Pt stated he was getting ready to leave the hospital. Will check back if d/c is delayed.   ___ Treatment cancelled today due to   Signature: Garrel Ridgel, OTR/L  Pager [phone] 06/02/2011

## 2011-06-02 NOTE — Progress Notes (Signed)
Subjective: 2 Days Post-Op Procedure(s) (LRB): TOTAL KNEE ARTHROPLASTY (Right) Patient reports pain as 4 on 0-10 scale.    Objective: Vital signs in last 24 hours: Temp:  [98.8 F (37.1 C)-99 F (37.2 C)] 99 F (37.2 C) (05/24 0610) Pulse Rate:  [81-92] 89  (05/24 0610) Resp:  [17-20] 20  (05/24 0610) BP: (123-171)/(74-89) 148/89 mmHg (05/24 0610) SpO2:  [93 %-100 %] 95 % (05/24 0610)  Intake/Output from previous day: 05/23 0701 - 05/24 0700 In: 2435 [P.O.:855; I.V.:1530] Out: 1020 [Urine:900; Drains:120] Intake/Output this shift:     Basename 06/02/11 0555 06/01/11 0457  HGB 11.2* 12.6*    Basename 06/02/11 0555 06/01/11 0457  WBC 8.2 6.8  RBC 4.08* 4.58  HCT 34.8* 39.2  PLT 157 162    Basename 06/02/11 0555 06/01/11 0457  NA 138 138  K 3.5 3.6  CL 101 102  CO2 26 28  BUN 6 6  CREATININE 1.11 1.19  GLUCOSE 96 110*  CALCIUM 8.8 8.6    Basename 06/02/11 0555 06/01/11 0457  LABPT -- --  INR 1.42 1.04    Neurovascular intact Dorsiflexion/Plantar flexion intact Incision: wound benign, no active bleeding or sign of infection Compartment soft  Assessment/Plan: 2 Days Post-Op Procedure(s) (LRB): TOTAL KNEE ARTHROPLASTY (Right) D/C IV fluids Discharge home with home health hemovac d/c without difficulty  Raymond [name] B 06/02/2011, 10:32 AM

## 2011-06-02 NOTE — Progress Notes (Signed)
Physical Therapy Treatment Note   06/02/11 0831  PT Visit Information  Last PT Received On 06/02/11  Assistance Needed +1  PT Time Calculation  PT Start Time 0831  PT Stop Time 0910  PT Time Calculation (min) 39 min  Subjective Data  Subjective Pt received sitting up in chair with report of 5/10 R knee pain.  Precautions  Precautions Knee  Required Braces or Orthoses Knee Immobilizer - Right  Knee Immobilizer - Right On when out of bed or walking  Restrictions  RLE Weight Bearing WBAT  Cognition  Overall Cognitive Status Appears within functional limits for tasks assessed/performed  Arousal/Alertness Awake/alert  Orientation Level Oriented X4 / Intact  Behavior During Session [facility] for tasks performed  Bed Mobility  Bed Mobility Not assessed  Transfers  Transfers Sit to Stand;Stand to Sit  Sit to Stand 4: Min guard;With upper extremity assist;From chair/3-in-1  Stand to Sit 4: Min guard;With upper extremity assist;With armrests;To chair/3-in-1  Details for Transfer Assistance v/c's for hand placement  Ambulation/Gait  Ambulation/Gait Assistance 4: Min guard  Ambulation Distance (Feet) 100 Feet  Assistive device Rolling walker  Ambulation/Gait Assistance Details pt with improved ability to increase WBing thru R LE  Gait Pattern Step-to pattern;Decreased stance time - right;Antalgic  Gait velocity slow  Stairs Yes  Stairs Assistance 4: Min guard  Stair Management Technique Two rails;Forwards (also backwards with RW for platform steps to enter home)  Number of Stairs 4  (2 platform, wife present and hand out provided)  Total Joint Exercises  Quad Sets AROM;Right;10 reps;Seated  Heel Slides AAROM;Right;10 reps;Seated  Goniometric ROM 55 deg  PT - End of Session  Equipment Utilized During Treatment Gait belt;Right knee immobilizer  Activity Tolerance Patient tolerated treatment well  Patient left in chair;with call bell/phone within reach;with family/visitor present  Nurse  Communication Mobility status  PT - Assessment/Plan  Comments on Treatment Session Pt and spouse demonstrate good technique for all mobility including stair negotiation. Patient safe to return home this date when MD approves.  PT Plan Discharge plan remains appropriate;Frequency remains appropriate  PT Frequency 7X/week  Follow Up Recommendations Home health PT;Supervision/Assistance - 24 hour  Acute Rehab PT Goals  Time For Goal Achievement 06/08/11  Potential to Achieve Goals Good  PT Goal: Sit to Stand - Progress Progressing toward goal  PT Goal: Ambulate - Progress Progressing toward goal  PT Goal: Up/Down Stairs - Progress Progressing toward goal  PT Goal: Perform Home Exercise Program - Progress Progressing toward goal     Pain: 4-5/10 R knee pain s/p PT  Lewis Shock, PT, DPT Pager #: [phone] Office #: [phone]

## 2011-06-02 NOTE — Progress Notes (Signed)
ANTICOAGULATION CONSULT NOTE - Follow Up Consult  Pharmacy Consult for Warfarin Indication: VTE px s/p R TKA on 5/22  Allergies  Allergen Reactions  . Aspirin Nausea And Vomiting    Patient Measurements: Height: 5\' 10"  (177.8 cm) Weight: 192 lb (87.091 kg) IBW/kg (Calculated) : 73   Vital Signs: Temp: 99 F (37.2 C) (05/24 0610) BP: 148/89 mmHg (05/24 0610) Pulse Rate: 89  (05/24 0610)  Labs:  Basename 06/02/11 0555 06/01/11 0457  HGB 11.2* 12.6*  HCT 34.8* 39.2  PLT 157 162  APTT -- --  LABPROT 17.6* 13.8  INR 1.42 1.04  HEPARINUNFRC -- --  CREATININE 1.11 1.19  CKTOTAL -- --  CKMB -- --  TROPONINI -- --    Estimated Creatinine Clearance: 81.3 ml/min (by C-G formula based on Cr of 1.11).   Assessment: 51 y.o. M on warfarin + lovenox 30 mg SQ every 12 hours (until INR >/= 1.8) for VTE px s/p L TKA on 5/22. INR this a.m is SUBtherapeutic though trending up (INR 1.42 << 1.02, goal of 2-3). Hgb/Hct/Plt slight drop. Bleeding through dressing noted to now be stabilized -- no other s/sx of bleeding noted. The patient was educated today.   Goal of Therapy:  INR 2-3 Monitor platelets by anticoagulation protocol: Yes   Plan:  1. Warfarin 10 mg x 1 dose at 1800 today 2. Will continue to monitor for any signs/symptoms of bleeding and will follow up with PT/INR in the a.m.   Georgina Pillion, PharmD, BCPS Clinical Pharmacist Pager: [phone] 06/02/2011 9:29 AM

## 2011-06-02 NOTE — Progress Notes (Signed)
Pt to D/C home. D/C instructions and scripts given. Lovenox teaching done with Pt and family.

## 2011-06-14 NOTE — Discharge Summary (Signed)
  ABBREVIATED DISCHARGE SUMMARY      DATE OF HOSPITALIZATION:  31 May 2011  REASON FOR HOSPITALIZATION:  51 yo bm with hx end stage djd right knee and pain.    SIGNIFICANT FINDINGS:  DJD  OPERATION:  Right tkr  FINAL DIAGNOSIS:  Same  SECONDARY DIAGNOSIS: none  CONSULTANTS:  none  DISCHARGE CONDITION:  STABLE  DISCHARGED TO:  HOME

## 2011-06-26 ENCOUNTER — Ambulatory Visit: Payer: Medicaid Other | Attending: Orthopedic Surgery | Admitting: Physical Therapy

## 2011-06-26 DIAGNOSIS — R5381 Other malaise: Secondary | ICD-10-CM | POA: Insufficient documentation

## 2011-06-26 DIAGNOSIS — R262 Difficulty in walking, not elsewhere classified: Secondary | ICD-10-CM | POA: Insufficient documentation

## 2011-06-26 DIAGNOSIS — M25569 Pain in unspecified knee: Secondary | ICD-10-CM | POA: Insufficient documentation

## 2011-06-26 DIAGNOSIS — IMO0001 Reserved for inherently not codable concepts without codable children: Secondary | ICD-10-CM | POA: Insufficient documentation

## 2011-06-26 DIAGNOSIS — M25669 Stiffness of unspecified knee, not elsewhere classified: Secondary | ICD-10-CM | POA: Insufficient documentation

## 2011-07-04 ENCOUNTER — Ambulatory Visit: Payer: Medicaid Other | Admitting: Physical Therapy

## 2011-07-06 ENCOUNTER — Ambulatory Visit: Payer: Medicaid Other | Admitting: Physical Therapy

## 2011-07-10 ENCOUNTER — Encounter: Payer: Medicaid Other | Admitting: Physical Therapy

## 2011-07-12 ENCOUNTER — Ambulatory Visit: Payer: Medicaid Other | Attending: Orthopedic Surgery | Admitting: Physical Therapy

## 2011-07-12 DIAGNOSIS — R262 Difficulty in walking, not elsewhere classified: Secondary | ICD-10-CM | POA: Insufficient documentation

## 2011-07-12 DIAGNOSIS — R5381 Other malaise: Secondary | ICD-10-CM | POA: Insufficient documentation

## 2011-07-12 DIAGNOSIS — IMO0001 Reserved for inherently not codable concepts without codable children: Secondary | ICD-10-CM | POA: Insufficient documentation

## 2011-07-12 DIAGNOSIS — M25669 Stiffness of unspecified knee, not elsewhere classified: Secondary | ICD-10-CM | POA: Insufficient documentation

## 2011-07-12 DIAGNOSIS — M25569 Pain in unspecified knee: Secondary | ICD-10-CM | POA: Insufficient documentation

## 2011-07-18 ENCOUNTER — Ambulatory Visit: Payer: Medicaid Other | Admitting: Physical Therapy

## 2011-07-25 ENCOUNTER — Encounter: Payer: Medicaid Other | Admitting: Physical Therapy

## 2013-06-27 IMAGING — CR DG CHEST 2V
2 series · 2 of 2 positions shown · non-contrast
Comparison: None.

CLINICAL DATA: Right total knee arthroplasty

CHEST - 2 VIEW

[view not recorded (1 of 2)]
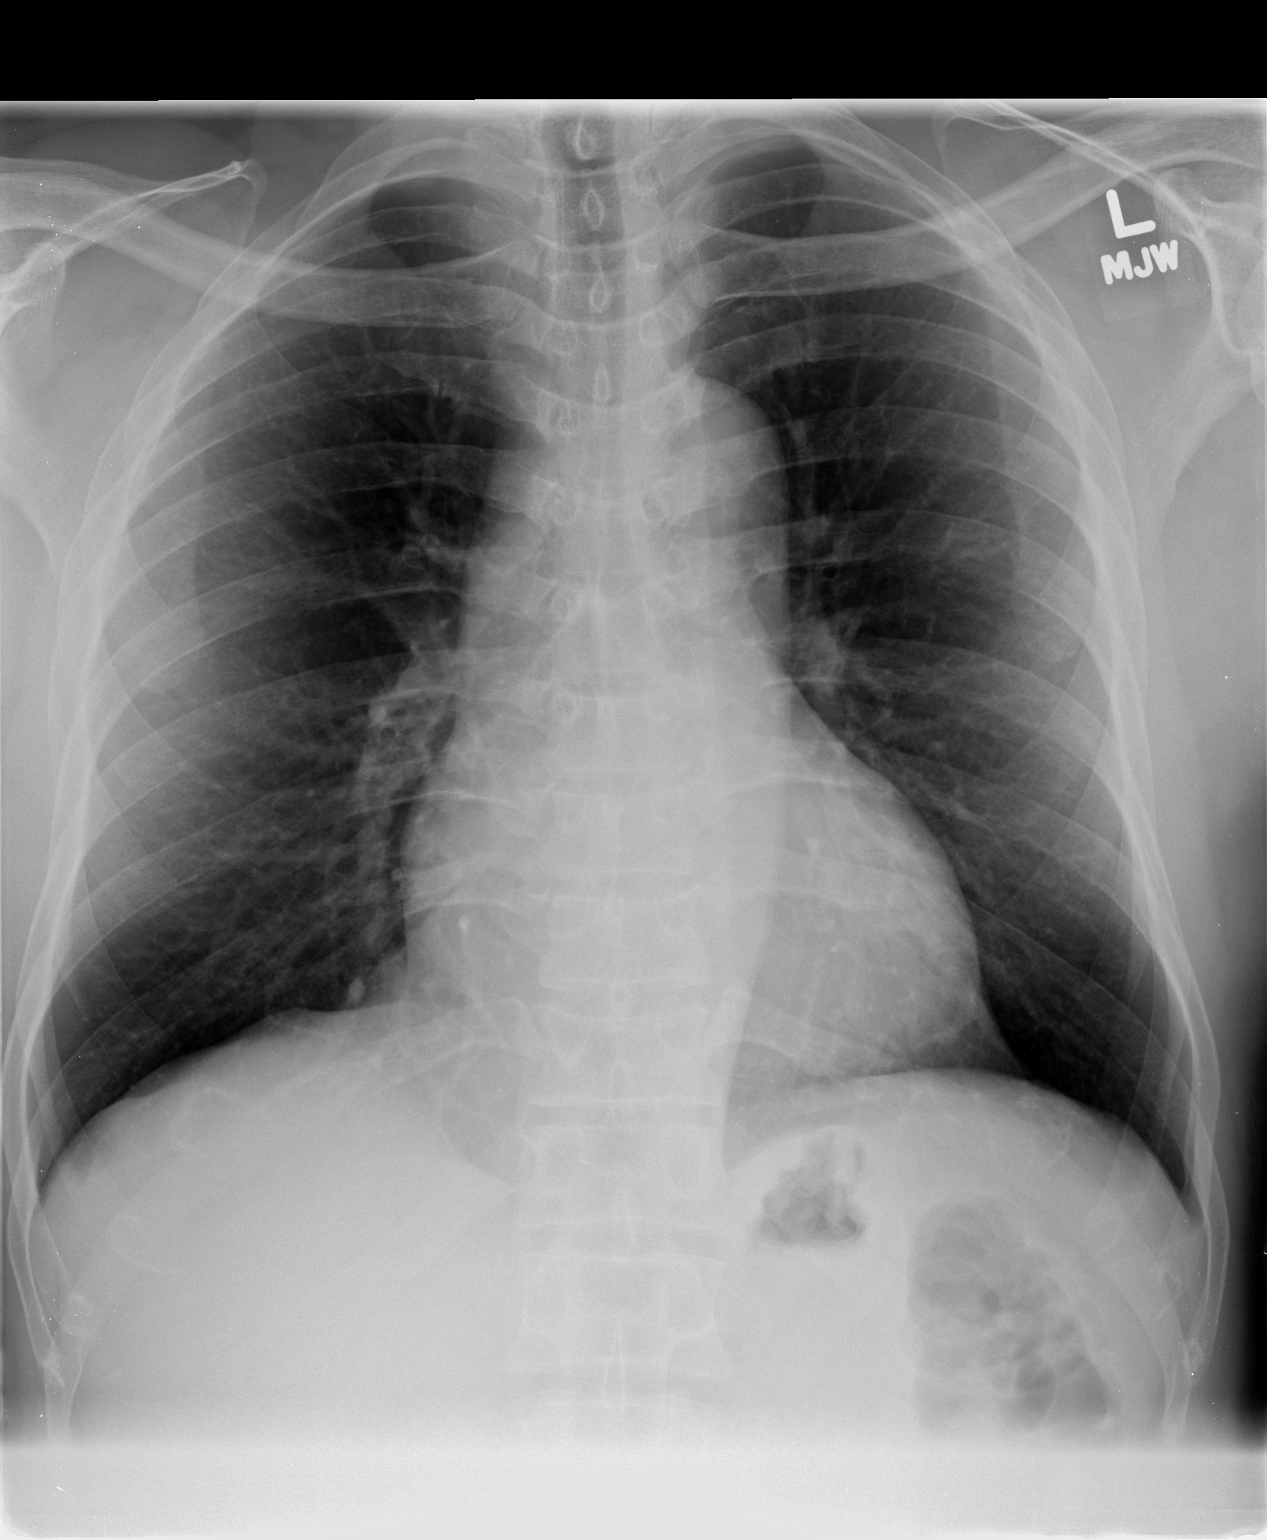

[view not recorded (2 of 2)]
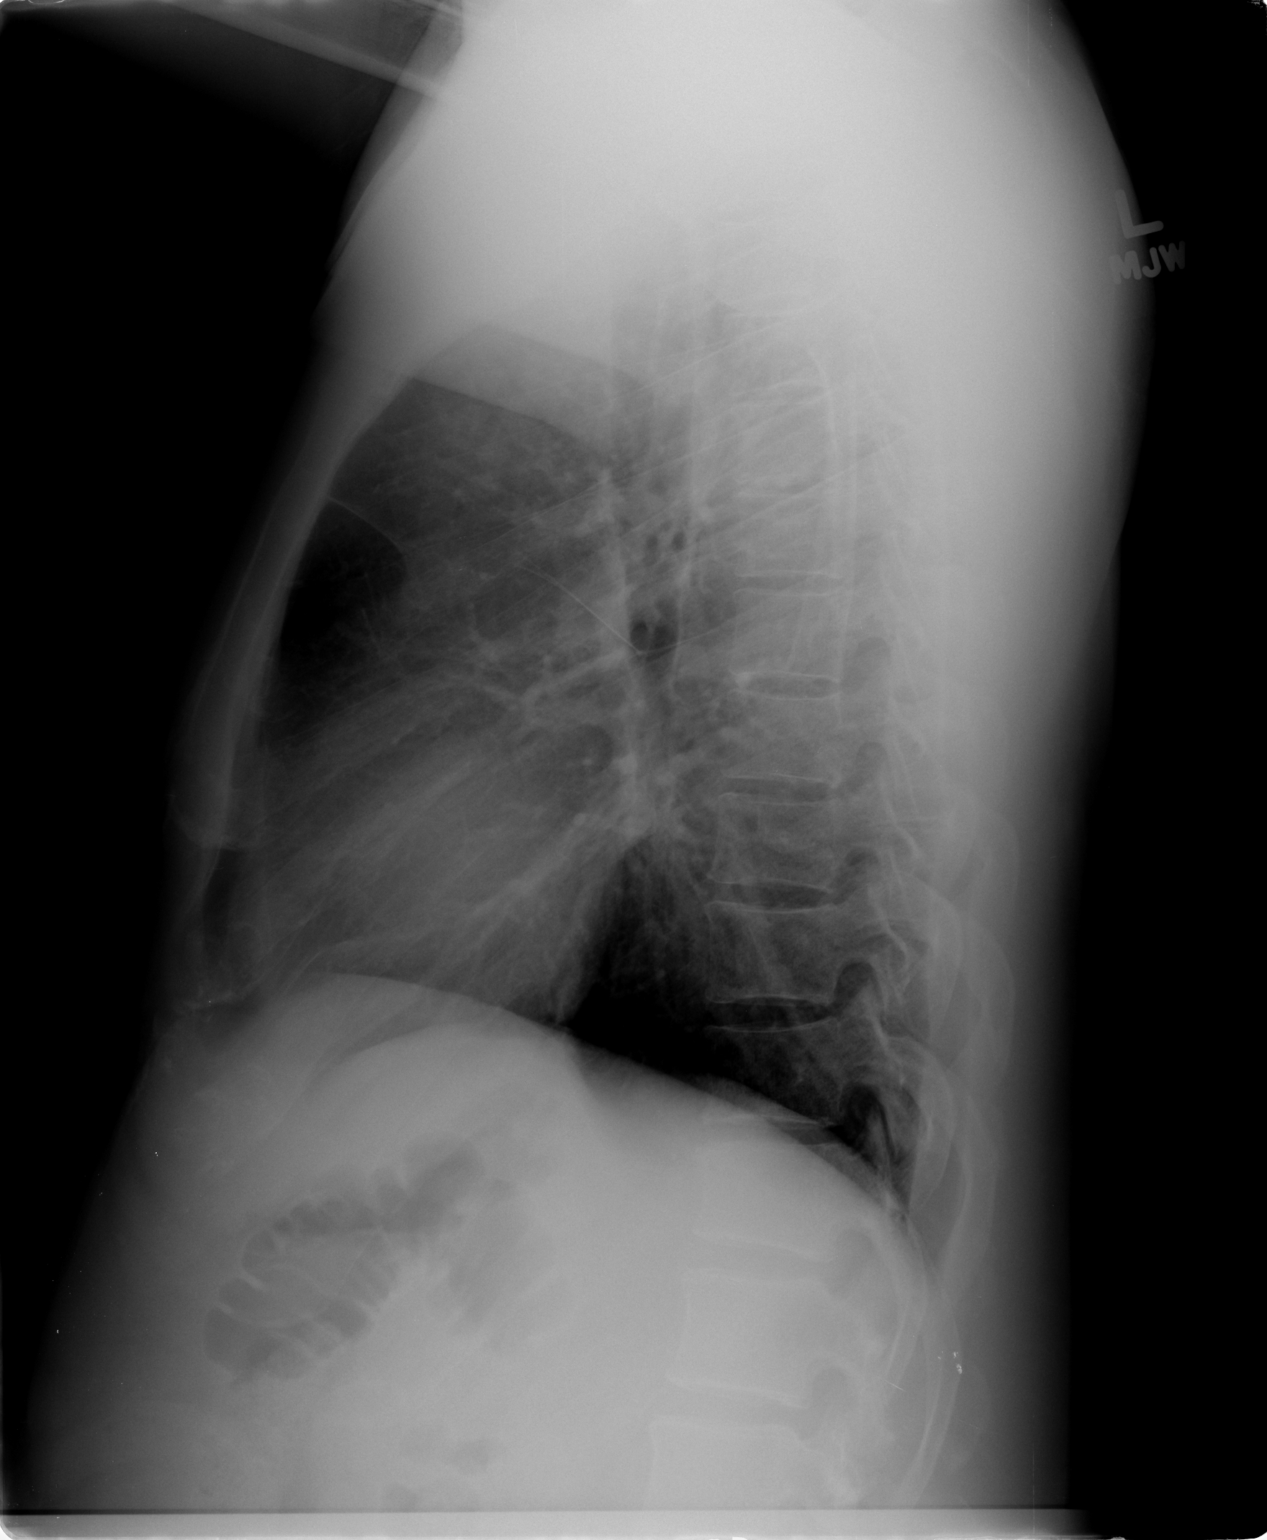

[2 of 2 positions shown; findings below may reference images not displayed]

FINDINGS: Normal mediastinum and cardiac silhouette.  Normal
pulmonary  vasculature.  No evidence of effusion, infiltrate, or
pneumothorax.  No acute bony abnormality.
IMPRESSION: No acute cardiopulmonary process.

## 2013-07-03 IMAGING — CR DG KNEE 1-2V PORT*R*
3 series · 3 of 3 positions shown · non-contrast
Comparison: 05/15/2011

CLINICAL DATA: Postop

PORTABLE RIGHT KNEE - 1-2 VIEW

[ap/obl knee (1 of 2)]
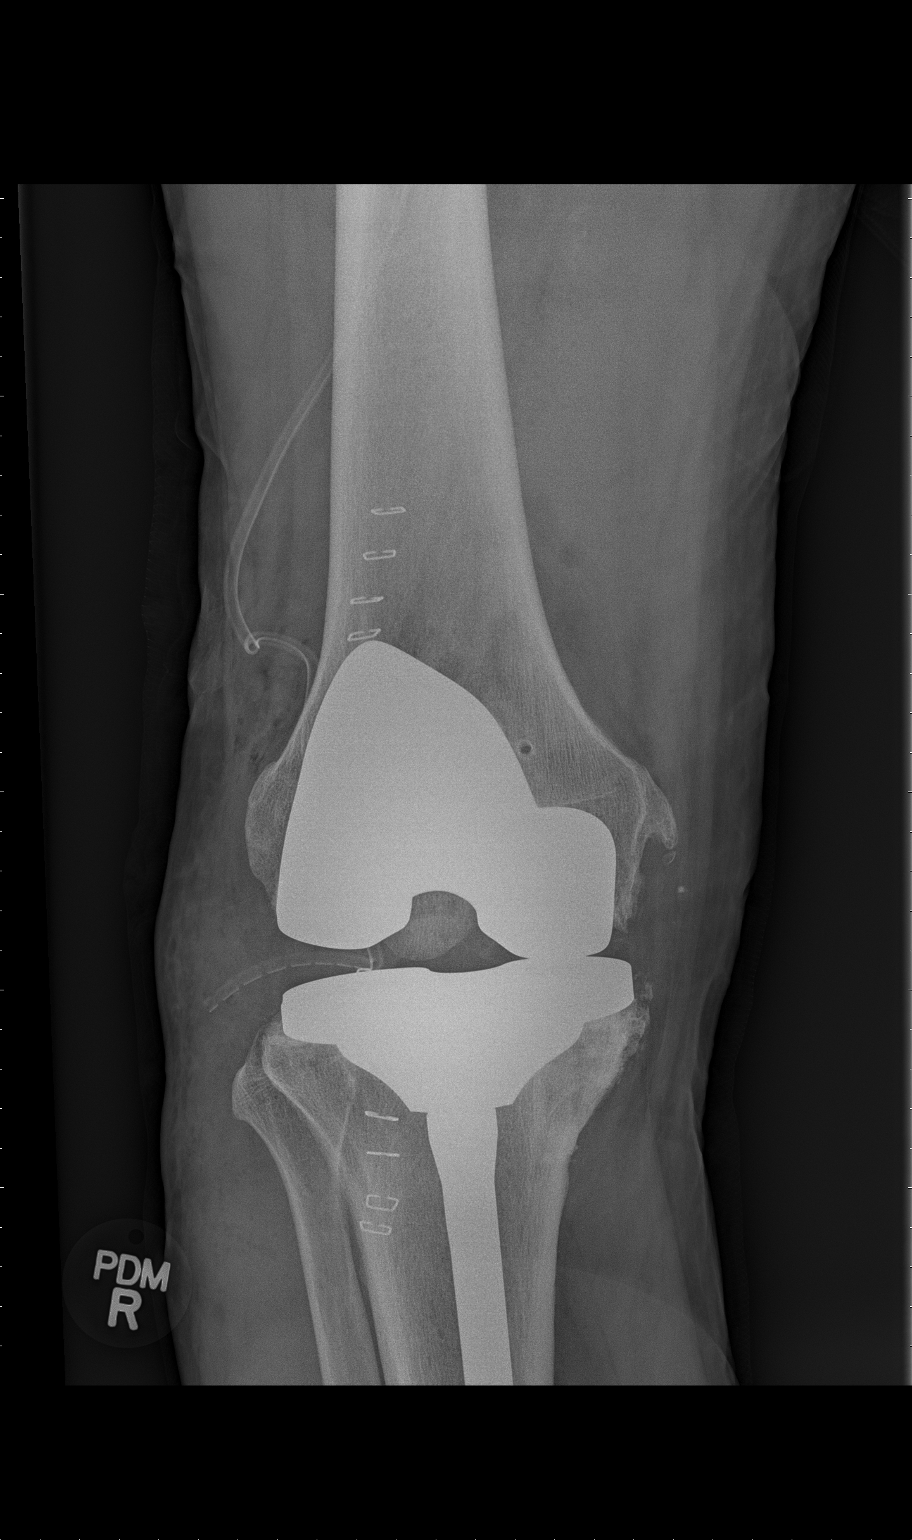

[ap/obl knee (2 of 2)]
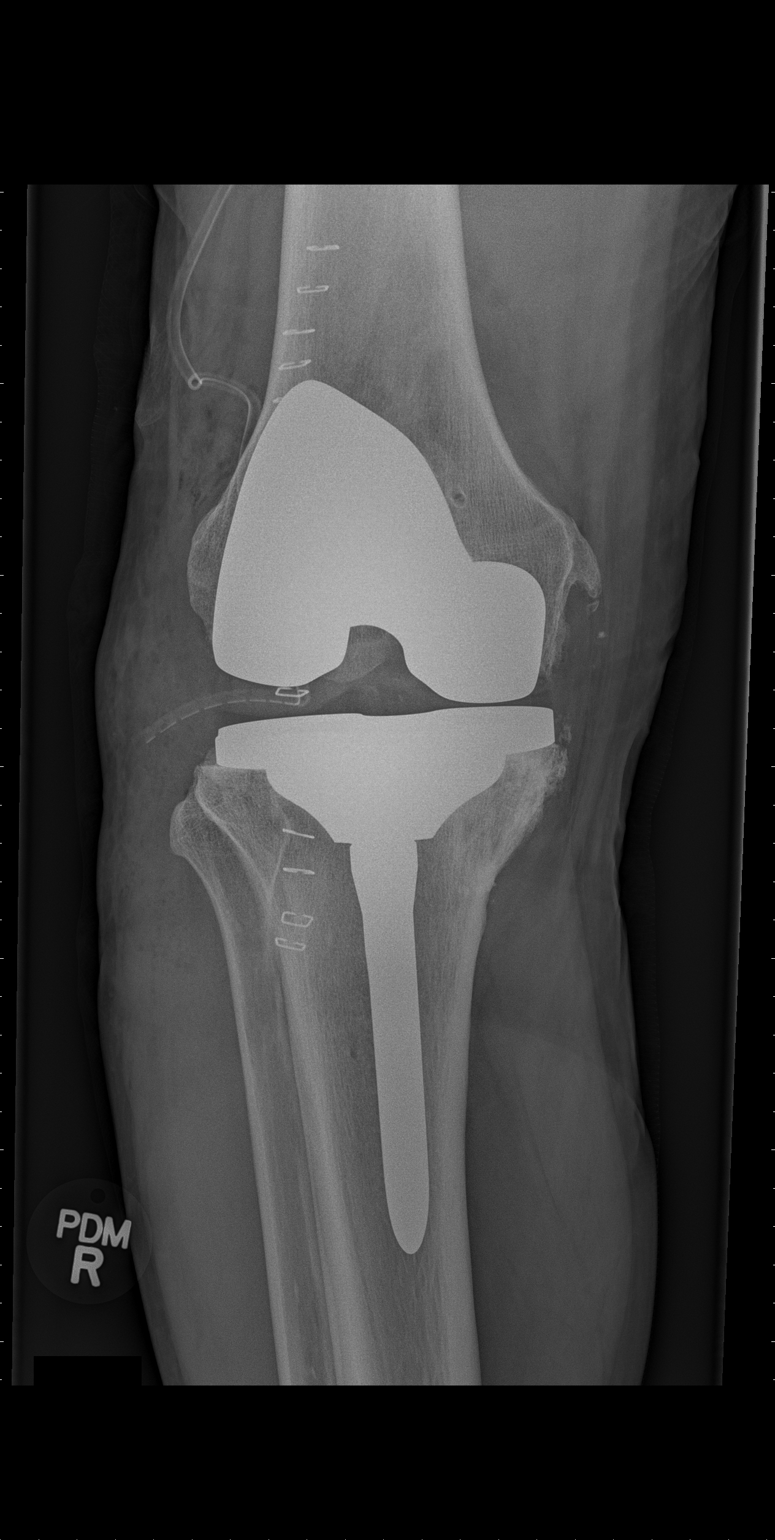

[knee lat]
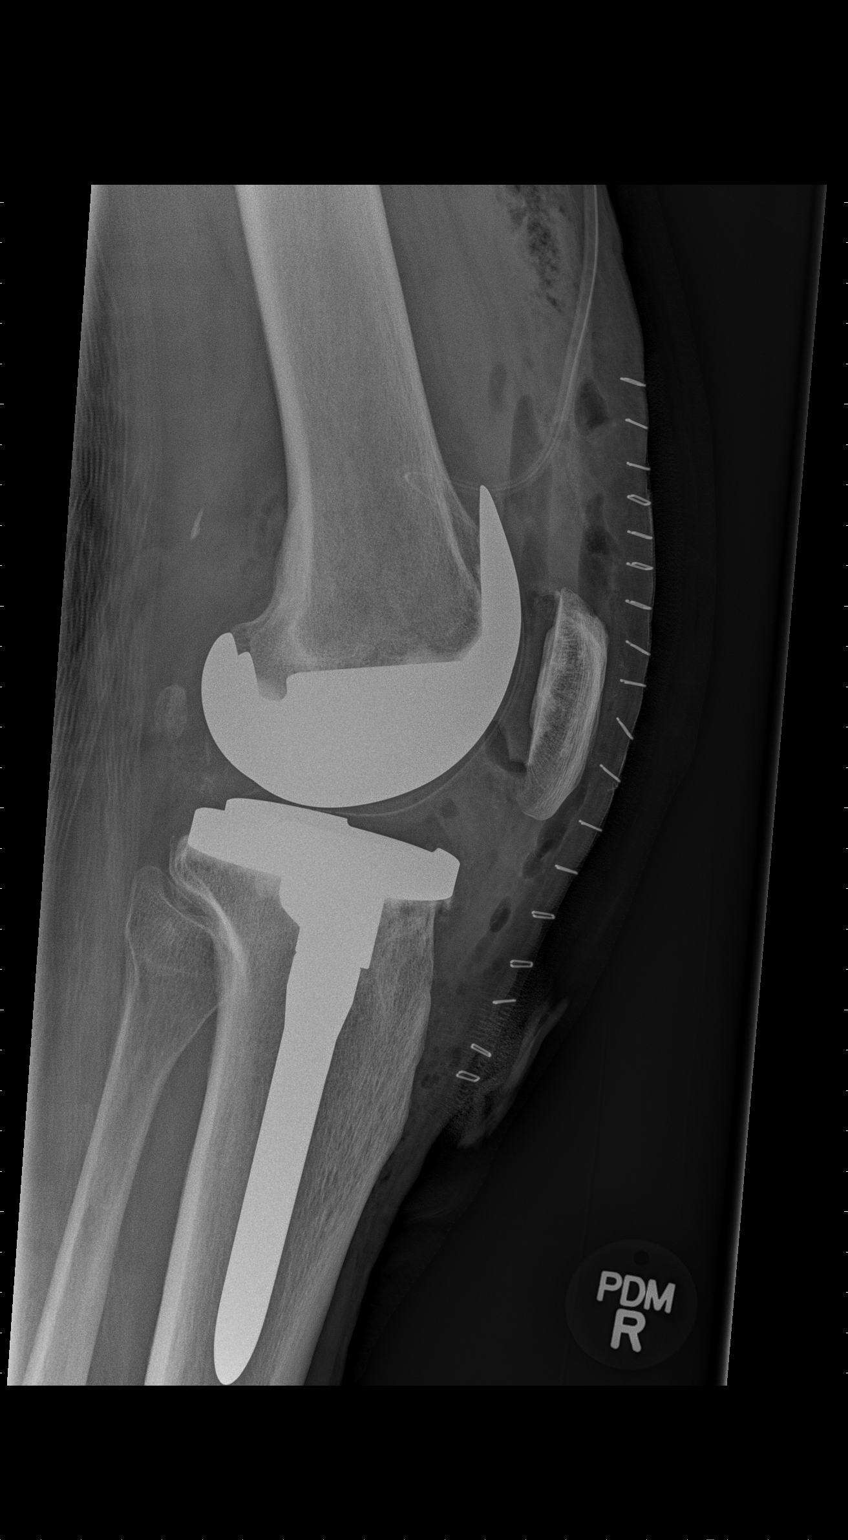

[3 of 3 positions shown; findings below may reference images not displayed]

FINDINGS: There is slight widening of the lateral compartment of
the total knee arthroplasty which may be positional or due to
distraction.  There is otherwise anatomic alignment and no breakage
or loosening of the hardware.
IMPRESSION: Total knee arthroplasty as described.

## 2013-11-13 ENCOUNTER — Other Ambulatory Visit (HOSPITAL_COMMUNITY): Payer: Self-pay | Admitting: Urology

## 2013-11-13 DIAGNOSIS — R972 Elevated prostate specific antigen [PSA]: Secondary | ICD-10-CM

## 2013-11-26 ENCOUNTER — Ambulatory Visit (HOSPITAL_COMMUNITY)
Admission: RE | Admit: 2013-11-26 | Discharge: 2013-11-26 | Disposition: A | Discharge status: Home / Self Care | Payer: Medicaid Other | Source: Ambulatory Visit | Attending: Urology | Admitting: Urology

## 2013-11-26 DIAGNOSIS — N400 Enlarged prostate without lower urinary tract symptoms: Secondary | ICD-10-CM | POA: Insufficient documentation

## 2013-11-26 DIAGNOSIS — R972 Elevated prostate specific antigen [PSA]: Secondary | ICD-10-CM

## 2013-11-26 LAB — POCT I-STAT CREATININE: Creatinine, Ser: 1.2 mg/dL (ref 0.50–1.35)

## 2013-11-26 MED ORDER — GADOBENATE DIMEGLUMINE 529 MG/ML IV SOLN
17.0000 mL | Freq: Once | INTRAVENOUS | Status: AC | PRN
  Administered 2013-11-26: 17 mL via INTRAVENOUS

## 2014-07-07 ENCOUNTER — Other Ambulatory Visit: Payer: Self-pay | Admitting: Urology

## 2014-07-09 ENCOUNTER — Encounter (HOSPITAL_BASED_OUTPATIENT_CLINIC_OR_DEPARTMENT_OTHER): Payer: Self-pay | Admitting: *Deleted

## 2014-07-09 NOTE — Progress Notes (Signed)
   07/09/14 1526  OBSTRUCTIVE SLEEP APNEA  Have you ever been diagnosed with sleep apnea through a sleep study? No  Do you snore loudly (loud enough to be heard through closed doors)?  1  Do you often feel tired, fatigued, or sleepy during the daytime? 0  Has anyone observed you stop breathing during your sleep? 0  Do you have, or are you being treated for high blood pressure? 1  BMI more than 35 kg/m2? 0  Age over 54 years old? 1  Gender: 1  Obstructive Sleep Apnea Score 4

## 2014-07-09 NOTE — Progress Notes (Signed)
NPO AFTER MN.  ARRIVE AT 0800.  NEEDS ISTAT 8 AND EKG. WILL DO FLEET ENEMA AM DOS.

## 2014-07-14 NOTE — Anesthesia Preprocedure Evaluation (Addendum)
Anesthesia Evaluation  Patient identified by MRN, date of birth, ID band Patient awake    Reviewed: Allergy & Precautions, H&P , NPO status , Patient's Chart, lab work & pertinent test results, reviewed documented beta blocker date and time   Airway Mallampati: III  TM Distance: >3 FB Neck ROM: Full    Dental  (+) Poor Dentition, Dental Advisory Given   Pulmonary Current Smoker,  breath sounds clear to auscultation  Pulmonary exam normal       Cardiovascular hypertension, Pt. on medications - CAD, - Past MI and - Cardiac Stents Normal cardiovascular examRhythm:Regular Rate:Normal     Neuro/Psych PSYCHIATRIC DISORDERS (Hx drug abuse) negative neurological ROS     GI/Hepatic negative GI ROS, Neg liver ROS,   Endo/Other  negative endocrine ROS  Renal/GU Renal InsufficiencyRenal disease   Elevated PSA    Musculoskeletal   Abdominal   Peds  Hematology negative hematology ROS (+)   Anesthesia Other Findings   Reproductive/Obstetrics                          Anesthesia Physical  Anesthesia Plan  ASA: II  Anesthesia Plan: MAC   Post-op Pain Management:    Induction: Intravenous  Airway Management Planned: Nasal Cannula  Additional Equipment:   Intra-op Plan:   Post-operative Plan: Extubation in OR  Informed Consent: I have reviewed the patients History and Physical, chart, labs and discussed the procedure including the risks, benefits and alternatives for the proposed anesthesia with the patient or authorized representative who has indicated his/her understanding and acceptance.   Dental advisory given  Plan Discussed with: CRNA and Anesthesiologist  Anesthesia Plan Comments: (- NPO appropriate, smokes a couple cigarettes a day, occasional alcohol use, beers, not daily, denies cocaine, marijuana last week - no blood thinning medications for > 5 days, never heard of the medication  warfarin or lovenox - does take Lisinopril-HCTZ combo, istat on day of surgery wnml - EKG with electronic read of T wave abnormality but denies cardiac symptoms, METS>4, no hx of CAD and risk factors for CAD only being HTN, male gender )     Anesthesia Quick Evaluation

## 2014-07-15 ENCOUNTER — Ambulatory Visit (HOSPITAL_BASED_OUTPATIENT_CLINIC_OR_DEPARTMENT_OTHER): Payer: Medicaid Other | Admitting: Anesthesiology

## 2014-07-15 ENCOUNTER — Ambulatory Visit (HOSPITAL_BASED_OUTPATIENT_CLINIC_OR_DEPARTMENT_OTHER)
Admission: RE | Admit: 2014-07-15 | Discharge: 2014-07-15 | Disposition: A | Discharge status: Home / Self Care | Payer: Medicaid Other | Source: Ambulatory Visit | Attending: Urology | Admitting: Urology

## 2014-07-15 ENCOUNTER — Encounter (HOSPITAL_BASED_OUTPATIENT_CLINIC_OR_DEPARTMENT_OTHER)
Admission: RE | Disposition: A | Discharge status: Home / Self Care | Payer: Self-pay | Source: Ambulatory Visit | Attending: Urology

## 2014-07-15 ENCOUNTER — Encounter (HOSPITAL_BASED_OUTPATIENT_CLINIC_OR_DEPARTMENT_OTHER): Payer: Self-pay | Admitting: *Deleted

## 2014-07-15 DIAGNOSIS — I10 Essential (primary) hypertension: Secondary | ICD-10-CM | POA: Insufficient documentation

## 2014-07-15 DIAGNOSIS — F17220 Nicotine dependence, chewing tobacco, uncomplicated: Secondary | ICD-10-CM | POA: Insufficient documentation

## 2014-07-15 DIAGNOSIS — Z79899 Other long term (current) drug therapy: Secondary | ICD-10-CM | POA: Insufficient documentation

## 2014-07-15 DIAGNOSIS — Z96659 Presence of unspecified artificial knee joint: Secondary | ICD-10-CM | POA: Insufficient documentation

## 2014-07-15 DIAGNOSIS — R972 Elevated prostate specific antigen [PSA]: Secondary | ICD-10-CM | POA: Insufficient documentation

## 2014-07-15 DIAGNOSIS — M1990 Unspecified osteoarthritis, unspecified site: Secondary | ICD-10-CM | POA: Insufficient documentation

## 2014-07-15 DIAGNOSIS — F329 Major depressive disorder, single episode, unspecified: Secondary | ICD-10-CM | POA: Insufficient documentation

## 2014-07-15 DIAGNOSIS — N423 Dysplasia of prostate: Secondary | ICD-10-CM | POA: Insufficient documentation

## 2014-07-15 DIAGNOSIS — N289 Disorder of kidney and ureter, unspecified: Secondary | ICD-10-CM | POA: Insufficient documentation

## 2014-07-15 HISTORY — DX: Personal history of traumatic brain injury: Z87.820

## 2014-07-15 HISTORY — PX: PROSTATE BIOPSY: SHX241

## 2014-07-15 HISTORY — DX: Nocturia: R35.1

## 2014-07-15 HISTORY — DX: Unspecified osteoarthritis, unspecified site: M19.90

## 2014-07-15 HISTORY — DX: Personal history of other mental and behavioral disorders: Z86.59

## 2014-07-15 HISTORY — DX: Elevated prostate specific antigen (PSA): R97.20

## 2014-07-15 HISTORY — DX: Other specified personal risk factors, not elsewhere classified: Z91.89

## 2014-07-15 HISTORY — DX: Benign prostatic hyperplasia without lower urinary tract symptoms: N40.0

## 2014-07-15 LAB — POCT I-STAT, CHEM 8
BUN: 11 mg/dL (ref 6–20)
CALCIUM ION: 1.2 mmol/L (ref 1.12–1.23)
Chloride: 103 mmol/L (ref 101–111)
Creatinine, Ser: 1.1 mg/dL (ref 0.61–1.24)
Glucose, Bld: 112 mg/dL — ABNORMAL HIGH (ref 65–99)
HCT: 53 % — ABNORMAL HIGH (ref 39.0–52.0)
Hemoglobin: 18 g/dL — ABNORMAL HIGH (ref 13.0–17.0)
Potassium: 3.6 mmol/L (ref 3.5–5.1)
Sodium: 141 mmol/L (ref 135–145)
TCO2: 23 mmol/L (ref 0–100)

## 2014-07-15 SURGERY — BIOPSY, PROSTATE, RECTAL APPROACH, WITH US GUIDANCE
Anesthesia: Monitor Anesthesia Care | Site: Prostate

## 2014-07-15 MED ORDER — MIDAZOLAM HCL 2 MG/2ML IJ SOLN
INTRAMUSCULAR | Status: AC
  Filled 2014-07-15: qty 4

## 2014-07-15 MED ORDER — PROPOFOL 500 MG/50ML IV EMUL
INTRAVENOUS | Status: DC | PRN
  Administered 2014-07-15: 160 ug/kg/min via INTRAVENOUS

## 2014-07-15 MED ORDER — LIDOCAINE HCL 2 % IJ SOLN
INTRAMUSCULAR | Status: DC | PRN
  Administered 2014-07-15: 6 mL

## 2014-07-15 MED ORDER — PROMETHAZINE HCL 25 MG/ML IJ SOLN
6.2500 mg | INTRAMUSCULAR | Status: DC | PRN
  Filled 2014-07-15: qty 1

## 2014-07-15 MED ORDER — HYDROCODONE-ACETAMINOPHEN 5-325 MG PO TABS
ORAL_TABLET | ORAL | Status: AC
  Filled 2014-07-15: qty 1

## 2014-07-15 MED ORDER — FENTANYL CITRATE (PF) 100 MCG/2ML IJ SOLN
INTRAMUSCULAR | Status: DC | PRN
  Administered 2014-07-15: 50 ug via INTRAVENOUS

## 2014-07-15 MED ORDER — DOCUSATE SODIUM 100 MG PO CAPS: 100.0000 mg | ORAL_CAPSULE | Freq: Two times a day (BID) | ORAL | Status: DC

## 2014-07-15 MED ORDER — LIDOCAINE HCL (CARDIAC) 20 MG/ML IV SOLN
INTRAVENOUS | Status: DC | PRN
  Administered 2014-07-15: 50 mg via INTRAVENOUS

## 2014-07-15 MED ORDER — LIDOCAINE HCL 2 % EX GEL
CUTANEOUS | Status: DC | PRN
  Administered 2014-07-15: 1

## 2014-07-15 MED ORDER — HYDROCODONE-ACETAMINOPHEN 5-325 MG PO TABS: 1.0000 | ORAL_TABLET | Freq: Four times a day (QID) | ORAL | Status: DC | PRN

## 2014-07-15 MED ORDER — FENTANYL CITRATE (PF) 100 MCG/2ML IJ SOLN
25.0000 ug | INTRAMUSCULAR | Status: DC | PRN
  Administered 2014-07-15 (×2): 25 ug via INTRAVENOUS
  Filled 2014-07-15: qty 1

## 2014-07-15 MED ORDER — MIDAZOLAM HCL 5 MG/5ML IJ SOLN
INTRAMUSCULAR | Status: DC | PRN
  Administered 2014-07-15: 2 mg via INTRAVENOUS

## 2014-07-15 MED ORDER — LACTATED RINGERS IV SOLN
INTRAVENOUS | Status: DC
  Administered 2014-07-15: 09:00:00 via INTRAVENOUS
  Filled 2014-07-15: qty 1000

## 2014-07-15 MED ORDER — FENTANYL CITRATE (PF) 100 MCG/2ML IJ SOLN
INTRAMUSCULAR | Status: AC
  Filled 2014-07-15: qty 4

## 2014-07-15 MED ORDER — FENTANYL CITRATE (PF) 100 MCG/2ML IJ SOLN
INTRAMUSCULAR | Status: AC
  Filled 2014-07-15: qty 2

## 2014-07-15 MED ORDER — HYDROCODONE-ACETAMINOPHEN 5-325 MG PO TABS
1.0000 | ORAL_TABLET | Freq: Four times a day (QID) | ORAL | Status: DC | PRN
  Administered 2014-07-15: 1 via ORAL
  Filled 2014-07-15: qty 2

## 2014-07-15 MED ORDER — GENTAMICIN SULFATE 40 MG/ML IJ SOLN
400.0000 mg | INTRAVENOUS | Status: AC
  Administered 2014-07-15: 400 mg via INTRAVENOUS
  Filled 2014-07-15: qty 10

## 2014-07-15 SURGICAL SUPPLY — 6 items
NDL SAFETY ECLIPSE 18X1.5 (NEEDLE) IMPLANT
NDL SPNL 22GX7 QUINCKE BK (NEEDLE) ×1 IMPLANT
NEEDLE HYPO 18GX1.5 SHARP (NEEDLE) ×3
NEEDLE SPNL 22GX7 QUINCKE BK (NEEDLE) ×3 IMPLANT
SYR CONTROL 10ML LL (SYRINGE) ×3 IMPLANT
UNDERPAD 30X30 INCONTINENT (UNDERPADS AND DIAPERS) ×5 IMPLANT

## 2014-07-15 NOTE — Addendum Note (Signed)
Addendum  created 07/15/14 1109 by Suan Halter, CRNA   Modules edited: Anesthesia LDA, Lines/Drains/Airways Properties Editor   Lines/Drains/Airways Properties Editor:  Properties of line/drain/airway/wound [REMOVED] Airway have been modified.

## 2014-07-15 NOTE — Op Note (Signed)
Preoperative diagnosis: Elevated PSA  Postoperative diagnosis: Elevated PSA  Procedure: 1. Transrectal ultrasound of the prostate 2. Saturation prostate needle biopsy  Surgeon: Dr. [name] Assistant: Dr. Pietro Cassis. Truc Winfree  Anesthesia: IV sedation  Complications: None  EBL: Minimal  Specimens: Specimens were obtained from the lateral and parasagittal regions of the base, mid, and apex of the prostate bilaterally. A total of 24 biopsy cores were obtained.  Disposition specimens: Pathology lab.  Indication: Raymond [name] is a 54 y.o. male with history of elevated PSA. He presents today for saturation prostate biopsy as his PSA has continued to rise despite prior negative biopsies.The risks and benefits of the procedure were discussed in detail. Informed, written consent was obtained.  Description of procedure:  The patient was taken to the operating room and conscious sedation was administered. He had been provided preoperative antibiotics and did prepare himself with an enema prior to his procedure. He was laid in the left lateral decubitus position. The transrectal ultrasound probe was then placed into the rectum and used to visualize the prostate. 10 cc of 1% lidocaine was then utilized to provide local anesthesia with a periprostatic nerve block. Images of the prostate were then obtained systematically. There were minor calcifications predominantly in the RIGHT mid. The prostate volume was measured at 29.5 cc. Systematic biopsies of the prostate were then performed under ultrasound guidance. A total of 24 cores were obtained. Biopsies were taken from the lateral and parasagittal regions of the base, mid, and apex of the prostate bilaterally.    Following removal of the transrectal ultrasound, a digital rectal exam was performed and it was confirmed that no significant bleeding was present. The patient tolerated the procedure well and without complications.

## 2014-07-15 NOTE — Anesthesia Postprocedure Evaluation (Signed)
  Anesthesia Post-op Note  Patient: Raymond [name]  Procedure(s) Performed: Procedure(s): SATURATION BIOPSY TRANSRECTAL ULTRASONIC PROSTATE (TUBP) (N/A)  Patient Location: PACU  Anesthesia Type:MAC  Level of Consciousness: awake, alert  and oriented  Airway and Oxygen Therapy: Patient Spontanous Breathing  Post-op Pain: none  Post-op Assessment: Post-op Vital signs reviewed, Respiratory Function Stable, Patent Airway, No signs of Nausea or vomiting and Pain level controlled              Post-op Vital Signs: Reviewed  Last Vitals:  Filed Vitals:   07/15/14 1045  BP: 122/76  Pulse: 64  Temp:   Resp: 16    Complications: No apparent anesthesia complications

## 2014-07-15 NOTE — Addendum Note (Signed)
Addendum  created 07/15/14 1252 by Suan Halter, CRNA   Modules edited: Anesthesia Flowsheet

## 2014-07-15 NOTE — Discharge Instructions (Signed)
Call your surgeon if you experience:   1.  Fever over 100.0. 2.  Inability to urinate. 3.  Nausea and/or vomiting. 4.  Extreme swelling or bruising at the surgical site. 5.  Continued bleeding from rectum 6.  Increased pain, redness or drainage from the incision. 7.  Problems related to your pain medication. 8.  Any problems and/or concerns  Post Anesthesia Home Care Instructions  Activity: Get plenty of rest for the remainder of the day. A responsible adult should stay with you for 24 hours following the procedure.  For the next 24 hours, DO NOT: -Drive a car -Paediatric nurse -Drink alcoholic beverages -Take any medication unless instructed by your physician -Make any legal decisions or sign important papers.  Meals: Start with liquid foods such as gelatin or soup. Progress to regular foods as tolerated. Avoid greasy, spicy, heavy foods. If nausea and/or vomiting occur, drink only clear liquids until the nausea and/or vomiting subsides. Call your physician if vomiting continues.  Special Instructions/Symptoms: Your throat may feel dry or sore from the anesthesia or the breathing tube placed in your throat during surgery. If this causes discomfort, gargle with warm salt water. The discomfort should disappear within 24 hours.  If you had a scopolamine patch placed behind your ear for the management of post- operative nausea and/or vomiting:  1. The medication in the patch is effective for 72 hours, after which it should be removed.  Wrap patch in a tissue and discard in the trash. Wash hands thoroughly with soap and water. 2. You may remove the patch earlier than 72 hours if you experience unpleasant side effects which may include dry mouth, dizziness or visual disturbances. 3. Avoid touching the patch. Wash your hands with soap and water after contact with the patch.

## 2014-07-15 NOTE — Interval H&P Note (Signed)
History and Physical Interval Note:  07/15/2014 9:27 AM  Raymond [name]  has presented today for surgery, with the diagnosis of ELEVATED PROSTATIC SPECIFIC ANTIGEN  The various methods of treatment have been discussed with the patient and family. After consideration of risks, benefits and other options for treatment, the patient has consented to  Procedure(s) with comments: SATURATION BIOPSY TRANSRECTAL ULTRASONIC PROSTATE (TUBP) (N/A) - 53 MIN [id number] [id number] as a surgical intervention .  The patient's history has been reviewed, patient examined, no change in status, stable for surgery.  I have reviewed the patient's chart and labs.  Questions were answered to the patient's satisfaction.     Lynnix Schoneman S

## 2014-07-15 NOTE — Transfer of Care (Signed)
Immediate Anesthesia Transfer of Care Note  Patient: Raymond [name]  Procedure(s) Performed: Procedure(s) (LRB): SATURATION BIOPSY TRANSRECTAL ULTRASONIC PROSTATE (TUBP) (N/A)  Patient Location: PACU  Anesthesia Type: MAC  Level of Consciousness: awake, alert , oriented and patient cooperative  Airway & Oxygen Therapy: Patient Spontanous Breathing and Patient connected to face mask oxygen  Post-op Assessment: Report given to PACU RN and Post -op Vital signs reviewed and stable  Post vital signs: Reviewed and stable  Complications: No apparent anesthesia complications

## 2014-07-15 NOTE — H&P (Signed)
Reason For Visit         Raymond [name] presents today for prostate ultrasound and biopsy with saturation approach.  He is continue to have a rising PSA.     History of Present Illness        Raymond [name] has been followed since July 2014 with an elevated PSA. He is currently 54 years of age. He is status post a prostate biopsy and ultrasound in January of 2015. PSA was approximately 4.0 which we felt was elevated for his age of 75 at that time. Rectal exam was unremarkable. Prostate volume was felt to be 30 g. Biopsies were negative for definitive adenocarcinoma but 4 of the 12 cores did show glandular atypia and or high-grade PIN.  We suggested repeat biopsy in 4-6 months. Repeat PSA in May 2015 is gone up further to 4.56 with a PSA to 15%.  Raymond [name] underwent repeat Bx in June of 2015. Rectal exam showed no change, but his PSA had gone up further. On repeat ultrasound, prostate volume continued to be unchanged at around 30 grams. All biopsies were again negative for definitive prostate cancer. We continue to see several biopsies that showed either atypia or high-grade PIN.  At the time of his original biopsy 4 separate cores showed atypia. On his second biopsy only one core showed atypia. In October 2015 the patient's PSA went up to 8.1.  At that time we suggested a prostate MRI. That was performed in November 2015. MRI did not show any worrisome features suggestive of prostate cancer. We felt that given his negative MRI and 2 biopsies within the past 12 months it would be appropriate to continue to monitor his situation.   Past Medical History Problems  1. History of Arthritis 2. History of depression (Z86.59) 3. History of hypertension (Z86.79)  Surgical History Problems  1. History of Knee Arthroscopy 2. History of Knee Replacement  Current Meds 1. Lisinopril TABS;  Therapy: (Recorded:02Jun2015) to Recorded  Allergies Medication  1. No Known Drug Allergies  Family History Problems   1. Family history of Death In The Family Father : Father 2. Family history of Death In The Family Mother : Mother   34yrs 3. Family history of Family Health Status Number Of Children   4 daughters 60. Family history of No Significant Family History  Social History Problems  1. Alcohol Use   2 qd 2. Denied: History of Caffeine Use 3. Current every day smoker (F17.200) 4. Denied: History of Occupation: 5. Tobacco use (Z72.0)   1/2 ppd for 81yrs  Review of Systems Genitourinary, constitutional, skin, eye, otolaryngeal, hematologic/lymphatic, cardiovascular, pulmonary, endocrine, musculoskeletal, gastrointestinal, neurological and psychiatric system(s) were reviewed and pertinent findings if present are noted and are otherwise negative.  Genitourinary: nocturia, but no hematuria.  Musculoskeletal: back pain.    Vitals Vital Signs [Data Includes: Last 1 Day]  Recorded: 28Jun2016 08:39AM  Blood Pressure: 149 / 91 Temperature: 99.2 F Heart Rate: 83  Physical Exam Constitutional: Well nourished and well developed . No acute distress.  ENT:. The ears and nose are normal in appearance.  Neck: The appearance of the neck is normal and no neck mass is present.  Pulmonary: No respiratory distress and normal respiratory rhythm and effort.  Cardiovascular: Heart rate and rhythm are normal . No peripheral edema.  Abdomen: The abdomen is soft and nontender. No masses are palpated. No CVA tenderness. No hernias are palpable. No hepatosplenomegaly noted.   Rectal: Rectal exam demonstrates normal sphincter tone, no tenderness  and no masses. Estimated prostate size is 1+. Normal rectal tone, no rectal masses, prostate is smooth, symmetric and non-tender. The prostate has no nodularity and is not tender. The left seminal vesicle is nonpalpable. The right seminal vesicle is nonpalpable. The perineum is normal on inspection.    Results/Data Urine [Data Includes: Last 1 Day]    [id number] COLOR YELLOW  APPEARANCE CLEAR  SPECIFIC GRAVITY 1.025  pH 5.5  GLUCOSE NEG mg/dL BILIRUBIN NEG  KETONE NEG mg/dL BLOOD TRACE  PROTEIN NEG mg/dL UROBILINOGEN 0.2 mg/dL NITRITE NEG  LEUKOCYTE ESTERASE NEG  SQUAMOUS EPITHELIAL/HPF RARE  WBC 0-2 WBC/hpf RBC 0-2 RBC/hpf BACTERIA NONE SEEN  CRYSTALS NONE SEEN  CASTS NONE SEEN  Other SEE NOTE   Assessment Assessed  1. Elevated prostate specific antigen (PSA) (R97.2) 2. 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 UA With REFLEX; [Do Not Release]; Status:Resulted - Requires Verification;   Done:  [id number] 08:26AM  Discussion/Summary and biopsy.    Raymond [name] has had a rising PSA now for the past 2 years. PSA has gone up from 3 to 5 to 8 and now 10. I have obviously been very pleased that his biopsies thus far have been negative but I am quite concerned about his situation, especially in light of the fact he has had atypia on his biopsies. We previously discussed the role of a third biopsy and consideration for a saturation approach under anesthesia. My plan is to go a head and proceed with that sometime in the next several weeks. We will plan on IV sedation with an increased saturation biopsy of the prostate.    Signatures Electronically signed by : [name], M.D.; Jul 08 2014  8:08AM EST

## 2014-07-16 ENCOUNTER — Encounter (HOSPITAL_BASED_OUTPATIENT_CLINIC_OR_DEPARTMENT_OTHER): Payer: Self-pay | Admitting: Urology

## 2016-02-08 ENCOUNTER — Other Ambulatory Visit (HOSPITAL_COMMUNITY): Payer: Self-pay | Admitting: Urology

## 2016-02-08 DIAGNOSIS — R972 Elevated prostate specific antigen [PSA]: Secondary | ICD-10-CM

## 2016-02-24 ENCOUNTER — Ambulatory Visit (HOSPITAL_COMMUNITY)
Admission: RE | Admit: 2016-02-24 | Discharge: 2016-02-24 | Disposition: A | Discharge status: Home / Self Care | Payer: Medicaid Other | Source: Ambulatory Visit | Attending: Urology | Admitting: Urology

## 2016-02-24 DIAGNOSIS — R972 Elevated prostate specific antigen [PSA]: Secondary | ICD-10-CM

## 2016-02-24 DIAGNOSIS — N402 Nodular prostate without lower urinary tract symptoms: Secondary | ICD-10-CM | POA: Insufficient documentation

## 2016-02-24 LAB — POCT I-STAT CREATININE: Creatinine, Ser: 1.1 mg/dL (ref 0.61–1.24)

## 2016-02-24 MED ORDER — GADOBENATE DIMEGLUMINE 529 MG/ML IV SOLN
20.0000 mL | Freq: Once | INTRAVENOUS | Status: AC | PRN
  Administered 2016-02-24: 17 mL via INTRAVENOUS

## 2017-05-11 ENCOUNTER — Ambulatory Visit (INDEPENDENT_AMBULATORY_CARE_PROVIDER_SITE_OTHER): Payer: Self-pay | Admitting: Physician Assistant

## 2017-08-10 ENCOUNTER — Encounter (HOSPITAL_COMMUNITY): Admission: RE | Payer: Self-pay | Source: Ambulatory Visit

## 2017-08-10 ENCOUNTER — Ambulatory Visit (HOSPITAL_COMMUNITY): Admission: RE | Admit: 2017-08-10 | Payer: Medicaid Other | Source: Ambulatory Visit | Admitting: Oral Surgery

## 2017-08-10 SURGERY — EXTRACTION, TOOTH, MOLAR
Anesthesia: General

## 2017-10-09 NOTE — H&P (Signed)
HISTORY AND PHYSICAL  Raymond [name] is a 57 y.o. male patient with CC: painful teeth  No diagnosis found.  Past Medical History:  Diagnosis Date  . Arthritis   . At risk for sleep apnea    STOP-BANG= 4    SENT TO PCP 07-09-2014  . BPH (benign prostatic hypertrophy)   . Elevated PSA   . History of concussion    yrs ago-- no LOC-- no residual  . History of depression   . Hypertension   . Nocturia     No current facility-administered medications for this encounter.    Current Outpatient Medications  Medication Sig Dispense Refill  . aspirin EC 81 MG tablet Take 81 mg by mouth daily.    Marland Kitchen gemfibrozil (LOPID) 600 MG tablet Take 600 mg by mouth 2 (two) times daily.  3  . lisinopril-hydrochlorothiazide (PRINZIDE,ZESTORETIC) 20-25 MG per tablet Take 1 tablet by mouth every morning.    . Naphazoline HCl (CLEAR EYES OP) Place 1 drop into both eyes daily as needed (redness).    . sulindac (CLINORIL) 200 MG tablet Take 200 mg by mouth 2 (two) times daily as needed for pain.  2  . tiZANidine (ZANAFLEX) 2 MG tablet Take 2-4 mg by mouth 3 (three) times daily as needed for muscle spasms.  1   No Known Allergies Active Problems:   * No active hospital problems. *  Vitals: There were no vitals taken for this visit. Lab results:No results found for this or any previous visit (from the past 49 hour(s)). Radiology Results: No results found. General appearance: alert and cooperative Head: Normocephalic, without obvious abnormality, atraumatic Eyes: negative Nose: Nares normal. Septum midline. Mucosa normal. No drainage or sinus tenderness. Throat: muliple carious and abscessed teeth. No purulence or trismus. Neck: no adenopathy, supple, symmetrical, trachea midline and thyroid not enlarged, symmetric, no tenderness/mass/nodules Resp: clear to auscultation bilaterally Cardio: regularly irregular rhythm  Assessment: Multiple nonrestorable teeth secondary to caries.  Plan: Multiple dental  extractions with alveoloplasty. GA. Day surgery.   [name] 10/09/2017

## 2017-10-11 ENCOUNTER — Other Ambulatory Visit: Payer: Self-pay

## 2017-10-11 ENCOUNTER — Encounter (HOSPITAL_COMMUNITY): Payer: Self-pay | Admitting: *Deleted

## 2017-10-11 NOTE — Progress Notes (Addendum)
Pt denies SOB, chest pain, and being under the care of a cardiologist. Pt denies having a stress test, echo and cardiac cath. Pt denies having a chest x ray within the last year. Pt stated that PCP, Dr. Orma Render did recent labs and an EKG; records requested. Pt made aware to stop taking Aspirin, vitamins, fish oil and herbal medications. Do not take any NSAIDs ie: Ibuprofen, Advil, Naproxen (Aleve), Motrin, sulindac (CLINORIL),  BC and Goody Powder. Pt verbalized understanding of all pre-op instructions.

## 2017-10-11 NOTE — Anesthesia Preprocedure Evaluation (Addendum)
Anesthesia Evaluation  Patient identified by MRN, date of birth, ID band Patient awake    Reviewed: Allergy & Precautions, NPO status , Patient's Chart, lab work & pertinent test results  History of Anesthesia Complications Negative for: history of anesthetic complications  Airway Mallampati: II  TM Distance: >3 FB Neck ROM: Full    Dental  (+) Poor Dentition, Chipped, Missing, Loose, Dental Advisory Given   Pulmonary Current Smoker,    breath sounds clear to auscultation       Cardiovascular hypertension, Pt. on medications (-) angina Rhythm:Regular Rate:Normal     Neuro/Psych Depression negative neurological ROS     GI/Hepatic negative GI ROS, (+)     substance abuse  alcohol use and cocaine use,   Endo/Other  negative endocrine ROS  Renal/GU negative Renal ROS     Musculoskeletal   Abdominal   Peds  Hematology negative hematology ROS (+)   Anesthesia Other Findings   Reproductive/Obstetrics                            Anesthesia Physical Anesthesia Plan  ASA: III  Anesthesia Plan: General   Post-op Pain Management:    Induction: Intravenous  PONV Risk Score and Plan: 1 and Ondansetron  Airway Management Planned: Nasal ETT  Additional Equipment:   Intra-op Plan:   Post-operative Plan: Extubation in OR  Informed Consent: I have reviewed the patients History and Physical, chart, labs and discussed the procedure including the risks, benefits and alternatives for the proposed anesthesia with the patient or authorized representative who has indicated his/her understanding and acceptance.   Dental advisory given  Plan Discussed with: CRNA and Surgeon  Anesthesia Plan Comments: (Plan routine monitors, GETA with NT intubation)        Anesthesia Quick Evaluation

## 2017-10-12 ENCOUNTER — Ambulatory Visit (HOSPITAL_COMMUNITY): Payer: Medicaid Other | Admitting: Certified Registered Nurse Anesthetist

## 2017-10-12 ENCOUNTER — Encounter (HOSPITAL_COMMUNITY)
Admission: RE | Disposition: A | Discharge status: Home / Self Care | Payer: Self-pay | Source: Ambulatory Visit | Attending: Oral Surgery

## 2017-10-12 ENCOUNTER — Encounter (HOSPITAL_COMMUNITY): Payer: Self-pay | Admitting: Urology

## 2017-10-12 ENCOUNTER — Ambulatory Visit (HOSPITAL_COMMUNITY)
Admission: RE | Admit: 2017-10-12 | Discharge: 2017-10-12 | Disposition: A | Discharge status: Home / Self Care | Payer: Medicaid Other | Source: Ambulatory Visit | Attending: Oral Surgery | Admitting: Oral Surgery

## 2017-10-12 DIAGNOSIS — I10 Essential (primary) hypertension: Secondary | ICD-10-CM | POA: Diagnosis not present

## 2017-10-12 DIAGNOSIS — F17200 Nicotine dependence, unspecified, uncomplicated: Secondary | ICD-10-CM | POA: Insufficient documentation

## 2017-10-12 DIAGNOSIS — K029 Dental caries, unspecified: Secondary | ICD-10-CM | POA: Diagnosis not present

## 2017-10-12 DIAGNOSIS — N400 Enlarged prostate without lower urinary tract symptoms: Secondary | ICD-10-CM | POA: Insufficient documentation

## 2017-10-12 DIAGNOSIS — Z79899 Other long term (current) drug therapy: Secondary | ICD-10-CM | POA: Insufficient documentation

## 2017-10-12 DIAGNOSIS — Z7982 Long term (current) use of aspirin: Secondary | ICD-10-CM | POA: Insufficient documentation

## 2017-10-12 DIAGNOSIS — F329 Major depressive disorder, single episode, unspecified: Secondary | ICD-10-CM | POA: Insufficient documentation

## 2017-10-12 HISTORY — DX: Dental caries, unspecified: K02.9

## 2017-10-12 HISTORY — DX: Presence of spectacles and contact lenses: Z97.3

## 2017-10-12 HISTORY — PX: MULTIPLE EXTRACTIONS WITH ALVEOLOPLASTY: SHX5342

## 2017-10-12 LAB — BASIC METABOLIC PANEL
Anion gap: 14 (ref 5–15)
BUN: 14 mg/dL (ref 6–20)
CHLORIDE: 101 mmol/L (ref 98–111)
CO2: 25 mmol/L (ref 22–32)
CREATININE: 1.15 mg/dL (ref 0.61–1.24)
Calcium: 9.5 mg/dL (ref 8.9–10.3)
GFR calc non Af Amer: >60 mL/min (ref >60)
Glucose, Bld: 107 mg/dL — ABNORMAL HIGH (ref 70–99)
Potassium: 3.6 mmol/L (ref 3.5–5.1)
SODIUM: 140 mmol/L (ref 135–145)

## 2017-10-12 LAB — CBC
HCT: 47.2 % (ref 39.0–52.0)
Hemoglobin: 14.7 g/dL (ref 13.0–17.0)
MCH: 27.5 pg (ref 26.0–34.0)
MCHC: 31.1 g/dL (ref 30.0–36.0)
MCV: 88.2 fL (ref 78.0–100.0)
PLATELETS: 203 10*3/uL (ref 150–400)
RBC: 5.35 MIL/uL (ref 4.22–5.81)
RDW: 14 % (ref 11.5–15.5)
WBC: 5.7 10*3/uL (ref 4.0–10.5)

## 2017-10-12 SURGERY — MULTIPLE EXTRACTION WITH ALVEOLOPLASTY
Anesthesia: General

## 2017-10-12 MED ORDER — MIDAZOLAM HCL 2 MG/2ML IJ SOLN: 0.5000 mg | Freq: Once | INTRAMUSCULAR | Status: DC | PRN

## 2017-10-12 MED ORDER — DEXAMETHASONE SODIUM PHOSPHATE 10 MG/ML IJ SOLN
INTRAMUSCULAR | Status: DC | PRN
  Administered 2017-10-12: 10 mg via INTRAVENOUS

## 2017-10-12 MED ORDER — PROMETHAZINE HCL 25 MG/ML IJ SOLN: 6.2500 mg | INTRAMUSCULAR | Status: DC | PRN

## 2017-10-12 MED ORDER — MEPERIDINE HCL 50 MG/ML IJ SOLN: 6.2500 mg | INTRAMUSCULAR | Status: DC | PRN

## 2017-10-12 MED ORDER — OXYCODONE-ACETAMINOPHEN 5-325 MG PO TABS: 1.0000 | ORAL_TABLET | ORAL | 0 refills | Status: DC | PRN

## 2017-10-12 MED ORDER — LACTATED RINGERS IV SOLN
INTRAVENOUS | Status: DC | PRN
  Administered 2017-10-12: 07:00:00 via INTRAVENOUS

## 2017-10-12 MED ORDER — ONDANSETRON HCL 4 MG/2ML IJ SOLN
INTRAMUSCULAR | Status: DC | PRN
  Administered 2017-10-12: 4 mg via INTRAVENOUS

## 2017-10-12 MED ORDER — KETOROLAC TROMETHAMINE 30 MG/ML IJ SOLN
INTRAMUSCULAR | Status: DC | PRN
  Administered 2017-10-12: 30 mg via INTRAVENOUS

## 2017-10-12 MED ORDER — MIDAZOLAM HCL 5 MG/5ML IJ SOLN
INTRAMUSCULAR | Status: DC | PRN
  Administered 2017-10-12: 2 mg via INTRAVENOUS

## 2017-10-12 MED ORDER — FENTANYL CITRATE (PF) 250 MCG/5ML IJ SOLN
INTRAMUSCULAR | Status: AC
  Filled 2017-10-12: qty 5

## 2017-10-12 MED ORDER — KETOROLAC TROMETHAMINE 30 MG/ML IJ SOLN
INTRAMUSCULAR | Status: AC
  Filled 2017-10-12: qty 1

## 2017-10-12 MED ORDER — SUGAMMADEX SODIUM 200 MG/2ML IV SOLN
INTRAVENOUS | Status: DC | PRN
  Administered 2017-10-12: 400 mg via INTRAVENOUS

## 2017-10-12 MED ORDER — HYDROMORPHONE HCL 1 MG/ML IJ SOLN
0.2500 mg | INTRAMUSCULAR | Status: DC | PRN
Start: 2017-10-12 — End: 2017-10-12

## 2017-10-12 MED ORDER — 0.9 % SODIUM CHLORIDE (POUR BTL) OPTIME
TOPICAL | Status: DC | PRN
  Administered 2017-10-12: 1000 mL

## 2017-10-12 MED ORDER — DEXMEDETOMIDINE HCL 200 MCG/2ML IV SOLN
INTRAVENOUS | Status: DC | PRN
  Administered 2017-10-12: 8 ug via INTRAVENOUS
  Administered 2017-10-12: 12 ug via INTRAVENOUS

## 2017-10-12 MED ORDER — ROCURONIUM BROMIDE 50 MG/5ML IV SOSY
PREFILLED_SYRINGE | INTRAVENOUS | Status: AC
  Filled 2017-10-12: qty 5

## 2017-10-12 MED ORDER — PROPOFOL 10 MG/ML IV BOLUS
INTRAVENOUS | Status: AC
  Filled 2017-10-12: qty 20

## 2017-10-12 MED ORDER — LIDOCAINE 2% (20 MG/ML) 5 ML SYRINGE
INTRAMUSCULAR | Status: DC | PRN
  Administered 2017-10-12: 60 mg via INTRAVENOUS
  Administered 2017-10-12: 40 mg via INTRAVENOUS

## 2017-10-12 MED ORDER — LIDOCAINE 2% (20 MG/ML) 5 ML SYRINGE
INTRAMUSCULAR | Status: AC
  Filled 2017-10-12: qty 5

## 2017-10-12 MED ORDER — LIDOCAINE-EPINEPHRINE 2 %-1:100000 IJ SOLN
INTRAMUSCULAR | Status: AC
  Filled 2017-10-12: qty 1

## 2017-10-12 MED ORDER — ONDANSETRON HCL 4 MG/2ML IJ SOLN
INTRAMUSCULAR | Status: AC
  Filled 2017-10-12: qty 2

## 2017-10-12 MED ORDER — AMOXICILLIN 500 MG PO CAPS: 500.0000 mg | ORAL_CAPSULE | Freq: Three times a day (TID) | ORAL | 0 refills | Status: DC

## 2017-10-12 MED ORDER — LIDOCAINE-EPINEPHRINE 2 %-1:100000 IJ SOLN
INTRAMUSCULAR | Status: DC | PRN
  Administered 2017-10-12: 20 mL via INTRADERMAL

## 2017-10-12 MED ORDER — CEFAZOLIN SODIUM-DEXTROSE 2-4 GM/100ML-% IV SOLN
2.0000 g | INTRAVENOUS | Status: AC
  Administered 2017-10-12: 2 g via INTRAVENOUS
  Filled 2017-10-12: qty 100

## 2017-10-12 MED ORDER — ESMOLOL HCL 100 MG/10ML IV SOLN
INTRAVENOUS | Status: DC | PRN
  Administered 2017-10-12: 20 mg via INTRAVENOUS

## 2017-10-12 MED ORDER — ROCURONIUM BROMIDE 10 MG/ML (PF) SYRINGE
PREFILLED_SYRINGE | INTRAVENOUS | Status: DC | PRN
  Administered 2017-10-12: 50 mg via INTRAVENOUS

## 2017-10-12 MED ORDER — OXYMETAZOLINE HCL 0.05 % NA SOLN
NASAL | Status: AC
  Filled 2017-10-12: qty 15

## 2017-10-12 MED ORDER — PROPOFOL 10 MG/ML IV BOLUS
INTRAVENOUS | Status: DC | PRN
  Administered 2017-10-12: 200 mg via INTRAVENOUS

## 2017-10-12 MED ORDER — FENTANYL CITRATE (PF) 100 MCG/2ML IJ SOLN
INTRAMUSCULAR | Status: DC | PRN
  Administered 2017-10-12: 50 ug via INTRAVENOUS
  Administered 2017-10-12: 25 ug via INTRAVENOUS

## 2017-10-12 MED ORDER — DEXAMETHASONE SODIUM PHOSPHATE 10 MG/ML IJ SOLN
INTRAMUSCULAR | Status: AC
  Filled 2017-10-12: qty 1

## 2017-10-12 MED ORDER — MIDAZOLAM HCL 2 MG/2ML IJ SOLN
INTRAMUSCULAR | Status: AC
  Filled 2017-10-12: qty 2

## 2017-10-12 SURGICAL SUPPLY — 36 items
BUR CROSS CUT FISSURE 1.6 (BURR) ×2 IMPLANT
BUR CROSS CUT FISSURE 1.6MM (BURR) ×1
BUR EGG ELITE 4.0 (BURR) ×2 IMPLANT
BUR EGG ELITE 4.0MM (BURR) ×1
CANISTER SUCT 3000ML PPV (MISCELLANEOUS) ×3 IMPLANT
COVER SURGICAL LIGHT HANDLE (MISCELLANEOUS) ×3 IMPLANT
COVER WAND RF STERILE (DRAPES) ×3 IMPLANT
CRADLE DONUT ADULT HEAD (MISCELLANEOUS) ×3 IMPLANT
DECANTER SPIKE VIAL GLASS SM (MISCELLANEOUS) IMPLANT
DRAPE U-SHAPE 76X120 STRL (DRAPES) ×3 IMPLANT
GAUZE PACKING FOLDED 2  STR (GAUZE/BANDAGES/DRESSINGS) ×2
GAUZE PACKING FOLDED 2 STR (GAUZE/BANDAGES/DRESSINGS) ×1 IMPLANT
GLOVE BIO SURGEON STRL SZ 6.5 (GLOVE) IMPLANT
GLOVE BIO SURGEON STRL SZ7.5 (GLOVE) ×3 IMPLANT
GLOVE BIO SURGEONS STRL SZ 6.5 (GLOVE)
GLOVE BIOGEL PI IND STRL 6.5 (GLOVE) IMPLANT
GLOVE BIOGEL PI IND STRL 7.0 (GLOVE) IMPLANT
GLOVE BIOGEL PI INDICATOR 6.5 (GLOVE)
GLOVE BIOGEL PI INDICATOR 7.0 (GLOVE)
GOWN STRL REUS W/ TWL LRG LVL3 (GOWN DISPOSABLE) ×1 IMPLANT
GOWN STRL REUS W/ TWL XL LVL3 (GOWN DISPOSABLE) ×1 IMPLANT
GOWN STRL REUS W/TWL LRG LVL3 (GOWN DISPOSABLE) ×3
GOWN STRL REUS W/TWL XL LVL3 (GOWN DISPOSABLE) ×3
IV NS 1000ML (IV SOLUTION) ×3
IV NS 1000ML BAXH (IV SOLUTION) ×1 IMPLANT
KIT BASIN OR (CUSTOM PROCEDURE TRAY) ×3 IMPLANT
KIT TURNOVER KIT B (KITS) ×3 IMPLANT
NEEDLE 22X1 1/2 (OR ONLY) (NEEDLE) ×6 IMPLANT
NEEDLE 27GAX1X1/2 (NEEDLE) IMPLANT
NS IRRIG 1000ML POUR BTL (IV SOLUTION) ×3 IMPLANT
PAD ARMBOARD 7.5X6 YLW CONV (MISCELLANEOUS) ×3 IMPLANT
SUT CHROMIC 3 0 PS 2 (SUTURE) ×3 IMPLANT
SYR CONTROL 10ML LL (SYRINGE) ×3 IMPLANT
TRAY ENT MC OR (CUSTOM PROCEDURE TRAY) ×3 IMPLANT
TUBING IRRIGATION (MISCELLANEOUS) ×3 IMPLANT
YANKAUER SUCT BULB TIP NO VENT (SUCTIONS) ×3 IMPLANT

## 2017-10-12 NOTE — Anesthesia Procedure Notes (Signed)
Procedure Name: Intubation Performed by: Milford Cage, CRNA Pre-anesthesia Checklist: Patient identified, Emergency Drugs available, Suction available and Patient being monitored Patient Re-evaluated:Patient Re-evaluated prior to induction Oxygen Delivery Method: Circle System Utilized Preoxygenation: Pre-oxygenation with 100% oxygen Induction Type: IV induction Ventilation: Mask ventilation without difficulty Laryngoscope Size: Mac and 3 Grade View: Grade III Nasal Tubes: Nasal Rae, Nasal prep performed and Magill forceps- large, utilized Tube size: 7.0 mm Number of attempts: 1 Placement Confirmation: ETT inserted through vocal cords under direct vision,  positive ETCO2 and breath sounds checked- equal and bilateral Secured at: 29 cm Tube secured with: Tape Dental Injury: Teeth and Oropharynx as per pre-operative assessment

## 2017-10-12 NOTE — Anesthesia Postprocedure Evaluation (Signed)
Anesthesia Post Note  Patient: Raymond [name]  Procedure(s) Performed: MULTIPLE EXTRACTION WITH ALVEOLOPLASTY (N/A )     Patient location during evaluation: PACU Anesthesia Type: General Level of consciousness: awake and alert, oriented and patient cooperative Pain management: pain level controlled Vital Signs Assessment: post-procedure vital signs reviewed and stable Respiratory status: spontaneous breathing, nonlabored ventilation and respiratory function stable Cardiovascular status: blood pressure returned to baseline and stable Postop Assessment: no apparent nausea or vomiting Anesthetic complications: no    Last Vitals:  Vitals:   10/12/17 0915 10/12/17 0934  BP: 127/83 127/84  Pulse: 68 69  Resp: 15 18  Temp: 36.5 C   SpO2: 96% 98%    Last Pain:  Vitals:   10/12/17 0934  TempSrc:   PainSc: 0-No pain                 Naleah Kofoed,E. Azalia Neuberger

## 2017-10-12 NOTE — Transfer of Care (Signed)
Immediate Anesthesia Transfer of Care Note  Patient: Raymond [name]  Procedure(s) Performed: MULTIPLE EXTRACTION WITH ALVEOLOPLASTY (N/A )  Patient Location: PACU  Anesthesia Type:General  Level of Consciousness: awake, alert  and oriented  Airway & Oxygen Therapy: Patient Spontanous Breathing and Patient connected to face mask oxygen  Post-op Assessment: Report given to RN and Post -op Vital signs reviewed and stable  Post vital signs: Reviewed and stable  Last Vitals:  Vitals Value Taken Time  BP 134/91 10/12/2017  8:21 AM  Temp    Pulse 90 10/12/2017  8:21 AM  Resp 21 10/12/2017  8:21 AM  SpO2 97 % 10/12/2017  8:21 AM  Vitals shown include unvalidated device data.  Last Pain:  Vitals:   10/12/17 0601  TempSrc: Oral  PainSc:       Patients Stated Pain Goal: 0 (29/93/71 6967)  Complications: No apparent anesthesia complications

## 2017-10-12 NOTE — Op Note (Signed)
NAMELADARRIAN, [name] MEDICAL RECORD ZD:[id number] ACCOUNT [id number] DATE OF BIRTH:[birth year] FACILITY: [facility] LOCATION: [facility] PHYSICIAN:Dion Parrow M. Shanara Schnieders, DDS  OPERATIVE REPORT  DATE OF PROCEDURE:  10/12/2017  PREOPERATIVE DIAGNOSIS:  Nonrestorable teeth numbers 2, 7, 9, 10, 14, 15, 16, 19, 22, 26, 29, 31.  POSTOPERATIVE DIAGNOSIS:  Nonrestorable teeth numbers 2, 7, 9, 10, 14, 15, 16, 19, 22, 26, 29, 31.    PROCEDURE:  Extraction teeth numbers 2, 7, 9, 10 14, 15, 16, 19, 26, 29, 31.  Alveoplasty left maxilla, right mandible.  SURGEON:  [name], DDS  ANESTHESIA:  General, nasal intubation.  ATTENDING:  Annye Asa, MD  DESCRIPTION OF PROCEDURE:  The patient was taken to the operating room and placed on the table in supine position.  General anesthesia was administered intravenously, and a nasal endotracheal tube was placed and secured.  The eyes were protected, and the  patient was draped for surgery.  A timeout was performed.  The posterior pharynx was suctioned and a throat pack was placed.  Lidocaine 2% 1:100,000 epinephrine was infiltrated in an inferior alveolar block on the right and left side, and then buccal  and palatal infiltration around the maxillary teeth to be removed.  Some buccal anesthesia was administered in the anterior mandible around teeth numbers 22 and 26.  A total of 20 mL was utilized.  A bite block was placed on the right side of the mouth,  and a sweetheart retractor was used to retract the tongue.  A #15 blade was used to make an incision around teeth numbers 19 22, and 26 in the mandible and around teeth numbers 14, 15, 16 in the maxilla.  The periosteum was reflected from around these teeth.   The teeth were elevated and removed from the mouth with the dental forceps.  Then the sockets were curetted.  Tooth #19 was sutured with 3-0 chromic.  In the area of teeth numbers 14, 15 and 16, there was excessive tissue which required trimming, and a  wedge  incision was created in the back to remove a portion of the tuberosity, fibrous tissue, and the tissue was reflected from #14 to #12 on the alveolar crest, and the bone was exposed, which was irregular in contour, so alveoplasty was performed in  this area.  Then the area was irrigated and closed with 3-0 chromic.  The bite block and sweetheart retractor were then repositioned to the other side of the mouth, and a 15 blade was used to make an incision around teeth numbers 2, 7, 9, 10 in the  maxilla and around tooth numbers 29 and 31 in the mandible and around tooth #26.  The periosteum was reflected with a periosteal elevator.  The teeth were elevated and removed with the dental forceps.  The sockets were curetted.  Tooth #31 had an  irregular alveolar ridge surrounding the tooth, and so the periosteum was reflected after incising up to tooth #29.  The tissue was reflected buccally, and an egg-shaped bur was used to perform an alveoplasty in this area.  Then the area was irrigated  and closed with 3-0 chromic.  The oral cavity was then irrigated, suctioned and the throat pack was removed.  The patient was left in the care of anesthesia for extubation, awakening and transport to recovery room with plans to discharge home through day  surgery.  ESTIMATED BLOOD LOSS:  Minimal.  COMPLICATIONS:  None.  SPECIMENS:  None.  LN/NUANCE  D:10/12/2017 T:10/12/2017 JOB:[id number]

## 2017-10-12 NOTE — H&P (Signed)
H&P documentation  -History and Physical Reviewed  -Patient has been re-examined  -No change in the plan of care  Raymond [name]  

## 2017-10-12 NOTE — Discharge Instructions (Signed)
NO IBUPROFEN UNTIL AFTER 2 PM!!!!

## 2017-10-12 NOTE — Op Note (Signed)
10/12/2017  8:09 AM  PATIENT:  [name]  57 y.o. male  PRE-OPERATIVE DIAGNOSIS:  non restorable teeth # 2, 7, 9, 10, 14, 15, 16, 19, 22, 26, 29, 31  POST-OPERATIVE DIAGNOSIS:  SAME  PROCEDURE:  Procedure(s): MULTIPLE EXTRACTIONteeth # 2, 7, 9, 10, 14, 15, 16, 19, 22, 26, 29, 31 WITH ALVEOLOPLASTY Left maxilla, right mandible  SURGEON:  Surgeon(s): [name], DDS  ANESTHESIA:   local and general  EBL:  minimal  DRAINS: none   SPECIMEN:  No Specimen  COUNTS:  YES  PLAN OF CARE: Discharge to home after PACU  PATIENT DISPOSITION:  PACU - hemodynamically stable.   PROCEDURE DETAILS: Dictation #  [id number]  Gae Bon, DMD 10/12/2017 8:09 AM

## 2017-10-13 ENCOUNTER — Encounter (HOSPITAL_COMMUNITY): Payer: Self-pay | Admitting: Oral Surgery

## 2018-07-30 ENCOUNTER — Other Ambulatory Visit: Payer: Self-pay | Admitting: Urology

## 2018-07-30 ENCOUNTER — Other Ambulatory Visit (HOSPITAL_COMMUNITY): Payer: Self-pay | Admitting: Urology

## 2018-07-30 DIAGNOSIS — R972 Elevated prostate specific antigen [PSA]: Secondary | ICD-10-CM

## 2018-07-31 MED ORDER — FLEET ENEMA 7-19 GM/118ML RE ENEM: 1.0000 | ENEMA | Freq: Once | RECTAL | Status: AC

## 2018-08-03 ENCOUNTER — Other Ambulatory Visit (HOSPITAL_COMMUNITY)
Admission: RE | Admit: 2018-08-03 | Discharge: 2018-08-03 | Disposition: A | Discharge status: Home / Self Care | Payer: Medicaid Other | Source: Ambulatory Visit | Attending: Urology | Admitting: Urology

## 2018-08-03 DIAGNOSIS — Z1159 Encounter for screening for other viral diseases: Secondary | ICD-10-CM | POA: Insufficient documentation

## 2018-08-03 LAB — SARS CORONAVIRUS 2 (TAT 6-24 HRS): SARS Coronavirus 2: NEGATIVE

## 2018-08-05 ENCOUNTER — Encounter (HOSPITAL_BASED_OUTPATIENT_CLINIC_OR_DEPARTMENT_OTHER): Payer: Self-pay | Admitting: *Deleted

## 2018-08-05 ENCOUNTER — Other Ambulatory Visit: Payer: Self-pay

## 2018-08-05 NOTE — Progress Notes (Signed)
Spoke with patient via telephone for pre op interview. NPO after MN. No medications AM of surgery. Will need Istat 8 AM of surgery. Current EKG on chart and in Epic. Arrival time 0930.

## 2018-08-07 ENCOUNTER — Ambulatory Visit (HOSPITAL_BASED_OUTPATIENT_CLINIC_OR_DEPARTMENT_OTHER)
Admission: RE | Admit: 2018-08-07 | Discharge: 2018-08-07 | Disposition: A | Discharge status: Home / Self Care | Payer: Medicaid Other | Attending: Urology | Admitting: Urology

## 2018-08-07 ENCOUNTER — Other Ambulatory Visit: Payer: Self-pay

## 2018-08-07 ENCOUNTER — Ambulatory Visit (HOSPITAL_COMMUNITY)
Admission: RE | Admit: 2018-08-07 | Discharge: 2018-08-07 | Disposition: A | Discharge status: Home / Self Care | Payer: Medicaid Other | Source: Ambulatory Visit | Attending: Urology | Admitting: Urology

## 2018-08-07 ENCOUNTER — Ambulatory Visit (HOSPITAL_BASED_OUTPATIENT_CLINIC_OR_DEPARTMENT_OTHER): Payer: Medicaid Other | Admitting: Anesthesiology

## 2018-08-07 ENCOUNTER — Encounter (HOSPITAL_BASED_OUTPATIENT_CLINIC_OR_DEPARTMENT_OTHER): Payer: Self-pay

## 2018-08-07 ENCOUNTER — Encounter (HOSPITAL_BASED_OUTPATIENT_CLINIC_OR_DEPARTMENT_OTHER)
Admission: RE | Disposition: A | Discharge status: Home / Self Care | Payer: Self-pay | Source: Home / Self Care | Attending: Urology

## 2018-08-07 DIAGNOSIS — R972 Elevated prostate specific antigen [PSA]: Secondary | ICD-10-CM | POA: Insufficient documentation

## 2018-08-07 DIAGNOSIS — F17210 Nicotine dependence, cigarettes, uncomplicated: Secondary | ICD-10-CM | POA: Insufficient documentation

## 2018-08-07 DIAGNOSIS — Z7982 Long term (current) use of aspirin: Secondary | ICD-10-CM | POA: Insufficient documentation

## 2018-08-07 DIAGNOSIS — I10 Essential (primary) hypertension: Secondary | ICD-10-CM | POA: Diagnosis not present

## 2018-08-07 DIAGNOSIS — C61 Malignant neoplasm of prostate: Secondary | ICD-10-CM | POA: Insufficient documentation

## 2018-08-07 DIAGNOSIS — Z79899 Other long term (current) drug therapy: Secondary | ICD-10-CM | POA: Diagnosis not present

## 2018-08-07 DIAGNOSIS — M1990 Unspecified osteoarthritis, unspecified site: Secondary | ICD-10-CM | POA: Insufficient documentation

## 2018-08-07 HISTORY — PX: PROSTATE BIOPSY: SHX241

## 2018-08-07 LAB — POCT I-STAT, CHEM 8
BUN: 16 mg/dL (ref 6–20)
Calcium, Ion: 1.09 mmol/L — ABNORMAL LOW (ref 1.15–1.40)
Chloride: 107 mmol/L (ref 98–111)
Creatinine, Ser: 0.9 mg/dL (ref 0.61–1.24)
Glucose, Bld: 101 mg/dL — ABNORMAL HIGH (ref 70–99)
HCT: 52 % (ref 39.0–52.0)
Hemoglobin: 17.7 g/dL — ABNORMAL HIGH (ref 13.0–17.0)
Potassium: 3.8 mmol/L (ref 3.5–5.1)
Sodium: 138 mmol/L (ref 135–145)
TCO2: 21 mmol/L — ABNORMAL LOW (ref 22–32)

## 2018-08-07 SURGERY — BIOPSY, PROSTATE, RECTAL APPROACH, WITH US GUIDANCE
Anesthesia: Monitor Anesthesia Care | Site: Prostate

## 2018-08-07 MED ORDER — ACETAMINOPHEN 500 MG PO TABS
ORAL_TABLET | ORAL | Status: AC
  Filled 2018-08-07: qty 2

## 2018-08-07 MED ORDER — KETAMINE HCL 10 MG/ML IJ SOLN
INTRAMUSCULAR | Status: AC
  Filled 2018-08-07: qty 1

## 2018-08-07 MED ORDER — CIPROFLOXACIN IN D5W 400 MG/200ML IV SOLN
INTRAVENOUS | Status: AC
  Filled 2018-08-07: qty 200

## 2018-08-07 MED ORDER — PROPOFOL 500 MG/50ML IV EMUL
INTRAVENOUS | Status: AC
  Filled 2018-08-07: qty 50

## 2018-08-07 MED ORDER — TRAMADOL HCL 50 MG PO TABS
50.0000 mg | ORAL_TABLET | Freq: Once | ORAL | Status: AC
  Administered 2018-08-07: 13:00:00 50 mg via ORAL
  Filled 2018-08-07: qty 1

## 2018-08-07 MED ORDER — MIDAZOLAM HCL 2 MG/2ML IJ SOLN
INTRAMUSCULAR | Status: AC
  Filled 2018-08-07: qty 2

## 2018-08-07 MED ORDER — LIDOCAINE HCL 2 % IJ SOLN
INTRAMUSCULAR | Status: AC
  Filled 2018-08-07: qty 20

## 2018-08-07 MED ORDER — FENTANYL CITRATE (PF) 100 MCG/2ML IJ SOLN
25.0000 ug | INTRAMUSCULAR | Status: DC | PRN
  Filled 2018-08-07: qty 1

## 2018-08-07 MED ORDER — ACETAMINOPHEN 500 MG PO TABS
1000.0000 mg | ORAL_TABLET | Freq: Once | ORAL | Status: AC
  Administered 2018-08-07: 1000 mg via ORAL
  Filled 2018-08-07: qty 2

## 2018-08-07 MED ORDER — LIDOCAINE HCL URETHRAL/MUCOSAL 2 % EX GEL
CUTANEOUS | Status: AC
  Filled 2018-08-07: qty 5

## 2018-08-07 MED ORDER — CIPROFLOXACIN HCL 500 MG PO TABS: 500.0000 mg | ORAL_TABLET | Freq: Two times a day (BID) | ORAL | 0 refills | Status: AC

## 2018-08-07 MED ORDER — MIDAZOLAM HCL 2 MG/2ML IJ SOLN
INTRAMUSCULAR | Status: DC | PRN
  Administered 2018-08-07: 2 mg via INTRAVENOUS

## 2018-08-07 MED ORDER — TRAMADOL HCL 50 MG PO TABS
ORAL_TABLET | ORAL | Status: AC
  Filled 2018-08-07: qty 1

## 2018-08-07 MED ORDER — TRAMADOL HCL 50 MG PO TABS: 50.0000 mg | ORAL_TABLET | Freq: Four times a day (QID) | ORAL | 0 refills | Status: DC | PRN

## 2018-08-07 MED ORDER — PROPOFOL 500 MG/50ML IV EMUL
INTRAVENOUS | Status: DC | PRN
  Administered 2018-08-07: 200 ug/kg/min via INTRAVENOUS

## 2018-08-07 MED ORDER — LIDOCAINE HCL 2 % IJ SOLN
INTRAMUSCULAR | Status: DC | PRN
  Administered 2018-08-07: 10 mL

## 2018-08-07 MED ORDER — LACTATED RINGERS IV SOLN
INTRAVENOUS | Status: DC
  Administered 2018-08-07: 50 mL/h via INTRAVENOUS
  Filled 2018-08-07: qty 1000

## 2018-08-07 MED ORDER — KETAMINE HCL 10 MG/ML IJ SOLN
INTRAMUSCULAR | Status: DC | PRN
  Administered 2018-08-07: 10 mg via INTRAVENOUS
  Administered 2018-08-07: 5 mg via INTRAVENOUS

## 2018-08-07 MED ORDER — CIPROFLOXACIN IN D5W 400 MG/200ML IV SOLN
400.0000 mg | Freq: Once | INTRAVENOUS | Status: AC
  Administered 2018-08-07: 400 mg via INTRAVENOUS
  Filled 2018-08-07: qty 200

## 2018-08-07 SURGICAL SUPPLY — 12 items
CLOTH BEACON ORANGE TIMEOUT ST (SAFETY) ×3 IMPLANT
INST BIOPSY MAXCORE 18GX25 (NEEDLE) IMPLANT
INSTR BIOPSY MAXCORE 18GX20 (NEEDLE) IMPLANT
KIT TURNOVER CYSTO (KITS) ×3 IMPLANT
NDL SAFETY ECLIPSE 18X1.5 (NEEDLE) IMPLANT
NDL SPNL 22GX7 QUINCKE BK (NEEDLE) ×1 IMPLANT
NEEDLE HYPO 18GX1.5 SHARP (NEEDLE) ×3
NEEDLE SPNL 22GX7 QUINCKE BK (NEEDLE) ×3 IMPLANT
NS IRRIG 500ML POUR BTL (IV SOLUTION) ×3 IMPLANT
PAD PREP 24X48 CUFFED NSTRL (MISCELLANEOUS) ×3 IMPLANT
SYR CONTROL 10ML LL (SYRINGE) ×2 IMPLANT
UNDERPAD 30X30 (UNDERPADS AND DIAPERS) ×6 IMPLANT

## 2018-08-07 NOTE — Anesthesia Postprocedure Evaluation (Signed)
Anesthesia Post Note  Patient: Raymond [name]  Procedure(s) Performed: BIOPSY TRANSRECTAL ULTRASONIC PROSTATE (TUBP) (N/A Prostate)     Patient location during evaluation: PACU Anesthesia Type: MAC Level of consciousness: awake and alert Pain management: pain level controlled Vital Signs Assessment: post-procedure vital signs reviewed and stable Respiratory status: spontaneous breathing, nonlabored ventilation, respiratory function stable and patient connected to nasal cannula oxygen Cardiovascular status: stable and blood pressure returned to baseline Postop Assessment: no apparent nausea or vomiting Anesthetic complications: no    Last Vitals:  Vitals:   08/07/18 1215 08/07/18 1230  BP: 102/60 126/73  Pulse: (!) 55 (!) 55  Resp: 13 14  Temp:    SpO2: 97% 99%    Last Pain:  Vitals:   08/07/18 1245  TempSrc:   PainSc: 0-No pain                 Christyna Letendre L Carry Weesner

## 2018-08-07 NOTE — Anesthesia Procedure Notes (Signed)
Procedure Name: MAC Date/Time: 08/07/2018 11:27 AM Performed by: Wanita Chamberlain, CRNA Pre-anesthesia Checklist: Patient identified, Emergency Drugs available, Suction available and Patient being monitored Patient Re-evaluated:Patient Re-evaluated prior to induction Oxygen Delivery Method: Nasal cannula Placement Confirmation: CO2 detector,  breath sounds checked- equal and bilateral and positive ETCO2

## 2018-08-07 NOTE — Discharge Instructions (Signed)

## 2018-08-07 NOTE — Transfer of Care (Signed)
Immediate Anesthesia Transfer of Care Note  Patient: Raymond [name]  Procedure(s) Performed: BIOPSY TRANSRECTAL ULTRASONIC PROSTATE (TUBP) (N/A Prostate)  Patient Location: PACU  Anesthesia Type:MAC  Level of Consciousness: sedated and patient cooperative  Airway & Oxygen Therapy: Patient Spontanous Breathing and Patient connected to nasal cannula oxygen  Post-op Assessment: Report given to RN and Post -op Vital signs reviewed and stable  Post vital signs: Reviewed and stable  Last Vitals:  Vitals Value Taken Time  BP    Temp    Pulse 62 08/07/18 1159  Resp 13 08/07/18 1159  SpO2 95 % 08/07/18 1159  Vitals shown include unvalidated device data.  Last Pain:  Vitals:   08/07/18 1015  TempSrc:   PainSc: 0-No pain      Patients Stated Pain Goal: 4 (03/50/09 3818)  Complications: No apparent anesthesia complications

## 2018-08-07 NOTE — H&P (Signed)
PRE-OP H&P  CC: Elevated PSA   HPI: 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 He had a 3rd biopsy performed in 2016 that was negative for malignancy or pre-malignant lesions. He also had a mpMRI (02/2016) that was negative for any significant pi-RADS lesions.   Last PSA- 42.60 (03/2018), 25.60 (09/2017), 31.4 (02/2017), 19.8 (2018). His PSA a steadily risen since his initial evaluation in 2014 (was 2.69 at that time). No personal/family history of prostate cancer   From a voiding standpoint, he reports good force of stream and feels like he is emptying his bladder well. He has only occasional urgency/frequency. Nocturia 1. Denies interval urinary tract infections, dysuria or hematuria. UA clear today.   He is using sildenafil 100mg  with a good erection. He denis any associated side effects.     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, Prostatic intraepithelial neoplasia with adjacent atypia - 2016      PMH Notes:  2012-07-30 10:42:00 - Note: Arthritis   NON-GU PMH: Encounter for general adult medical examination without abnormal findings, Encounter for preventive health examination - 2017 Personal history of other diseases of the circulatory system, History of hypertension - 2014 Personal history of other mental and behavioral disorders, History of depression - 2014 Hypercholesterolemia    FAMILY HISTORY: Death In The Family Father - Father Death In The Family Mother -  Mother Family Health Status Number - Runs In Family No Significant Family History - Runs In Family   SOCIAL HISTORY: Marital Status: Single Preferred Language: English; Ethnicity: Not Hispanic Or Latino; Race: Black or African American Current Smoking Status: Patient smokes occasionally. Has smoked since 01/10/1996. Smokes less than 1/2 pack per day.   Tobacco Use Assessment Completed: Used Tobacco in last 30 days? Types of alcohol consumed: Beer. Light Drinker.  Drinks 1 caffeinated drink per day.    REVIEW OF SYSTEMS:    GU Review Male:   Patient denies frequent urination, hard to postpone urination, burning/ pain with urination, get up at night to urinate, leakage of urine, stream starts and stops, trouble starting your stream, have to strain to urinate , erection problems, and penile pain.  Gastrointestinal (Upper):   Patient denies nausea, vomiting, and indigestion/ heartburn.  Gastrointestinal (Lower):   Patient denies diarrhea and constipation.  Constitutional:   Patient denies fever, night sweats, weight loss, and fatigue.  Skin:   Patient denies skin rash/ lesion and itching.  Eyes:   Patient denies blurred vision and double vision.  Ears/ Nose/ Throat:   Patient denies sore throat and sinus problems.  Hematologic/Lymphatic:   Patient denies swollen glands and easy bruising.  Cardiovascular:   Patient denies leg swelling and chest pains.  Respiratory:   Patient denies cough and shortness of breath.  Endocrine:   Patient denies excessive thirst.  Musculoskeletal:   Patient denies back pain and joint pain.  Neurological:  Patient denies headaches and dizziness.  Psychologic:   Patient denies depression and anxiety.   Notes: elevated PSA     VITAL SIGNS:      07/18/2018 07:53 AM  Weight 182 lb / 82.55 kg  Height 70 in / 177.8 cm  BP 127/79 mmHg  Heart Rate 76 /min  Temperature 98.4 F / 36.8 C  BMI 26.1 kg/m   MULTI-SYSTEM PHYSICAL EXAMINATION:    Constitutional:  Well-nourished. No physical deformities. Normally developed. Good grooming.  Neck: Neck symmetrical, not swollen. Normal tracheal position.  Respiratory: No labored breathing, no use of accessory muscles.   Cardiovascular: Normal temperature, normal extremity pulses, no swelling, no varicosities.  Skin: No paleness, no jaundice, no cyanosis. No lesion, no ulcer, no rash.  Neurologic / Psychiatric: Oriented to time, oriented to place, oriented to person. No depression, no anxiety, no agitation.  Eyes: Normal conjunctivae. Normal eyelids.  Ears, Nose, Mouth, and Throat: Left ear no scars, no lesions, no masses. Right ear no scars, no lesions, no masses. Nose no scars, no lesions, no masses. Normal hearing. Normal lips.  Musculoskeletal: Normal gait and station of head and neck.     PAST DATA REVIEWED:  Source Of History:  Patient   03/18/18 10/01/17 02/19/17 08/21/16 02/01/16 07/19/15 01/26/15 06/16/14  PSA  Total PSA 42.60 ng/mL 25.60 ng/mL 31.40 ng/mL 19.80 ng/mL 20.40 ng/dl 15.50  13.24  9.96   Free PSA        1.46   % Free PSA        15     08/21/16  Hormones  Testosterone, Total 319.0 ng/dL    PROCEDURES:          Urinalysis Dipstick Dipstick Cont'd  Color: Yellow Bilirubin: Neg mg/dL  Appearance: Clear Ketones: Neg mg/dL  Specific Gravity: 1.025 Blood: Neg ery/uL  pH: <=5.0 Protein: Neg mg/dL  Glucose: Neg mg/dL Urobilinogen: 0.2 mg/dL    Nitrites: Neg    Leukocyte Esterase: Neg leu/uL    ASSESSMENT:      ICD-10 Details  1 GU:   Elevated PSA - R97.20   2   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 We discussed specific pros/cons in direct relationship to his overall health status and life expectancy.   I extensively discussed the option of prostate biopsy. I specifically discussed potential risks including but not limited to the possibility of blood per rectum, per urethra and in the ejaculate, urinary retention and the possibility for UTI/bacteremia. He voices understanding and wishes to proceed with a prostate biopsy under sedation in the OR.

## 2018-08-07 NOTE — Op Note (Signed)
Operative Note  Preoperative diagnosis:  1.  Elevated PSA  Postoperative diagnosis: 1.  Elevated PSA  Procedure(s): 1.  Transrectal ultrasound of the prostate with prostate biopsy  Surgeon: Ellison Hughs, MD  Assistants:  None  Anesthesia:  MAC  Complications:  None  EBL: Less than 5 mL  Specimens: 1.  A total of 12 core biopsies were taken of the prostate from each sextant  Drains/Catheters: 1.  None  Intraoperative findings:   1. Prostate volume 38 cm 2. No hypoechoic areas were noted on transrectal ultrasound  Indication:  Raymond [name] is a 58 y.o. male with a history of a markedly elevated PSA.  He is undergone several prostate biopsies in the past that have been benign and/or showed high-grade PIN.  His last PSA was 42.6 in March 2020.  He has been consented for the above procedures, voices understanding and wishes to proceed.  Description of procedure:  After informed consent was obtained, the patient was brought to the operating room and MAC anesthesia was administered.  The patient was placed in the right lateral decubitus position and prepped in the usual fashion.  A timeout was then performed.  A transrectal ultrasound probe was then carefully inserted into the rectum.  Photographs were taken from the seminal vesicles, prostate base, mid gland and prostate apex in the transverse and sagittal views.  The prostate volume was found to be 38 cm.  2% lidocaine was then injected around the apex of the prostate for local anesthetic.  A total of 12 biopsies were then taken from each sextant of the prostate.  The patient tolerated the procedure well and was transferred to the postanesthesia unit in stable condition.  Plan: Follow-up in 1 week to discuss pathology results.

## 2018-08-07 NOTE — Anesthesia Preprocedure Evaluation (Addendum)
Anesthesia Evaluation  Patient identified by MRN, date of birth, ID band Patient awake    Reviewed: Allergy & Precautions, NPO status , Patient's Chart, lab work & pertinent test results  Airway Mallampati: I  TM Distance: >3 FB Neck ROM: Full    Dental no notable dental hx. (+) Missing, Poor Dentition, Dental Advisory Given,    Pulmonary neg pulmonary ROS, Current Smoker,    Pulmonary exam normal breath sounds clear to auscultation       Cardiovascular hypertension, negative cardio ROS Normal cardiovascular exam Rhythm:Regular Rate:Normal     Neuro/Psych PSYCHIATRIC DISORDERS Depression negative neurological ROS     GI/Hepatic negative GI ROS, Neg liver ROS,   Endo/Other  negative endocrine ROS  Renal/GU negative Renal ROS  negative genitourinary   Musculoskeletal  (+) Arthritis ,   Abdominal   Peds  Hematology negative hematology ROS (+)   Anesthesia Other Findings   Reproductive/Obstetrics                             Anesthesia Physical Anesthesia Plan  ASA: II  Anesthesia Plan: MAC   Post-op Pain Management:    Induction: Intravenous  PONV Risk Score and Plan: 1 and Propofol infusion, Treatment may vary due to age or medical condition, Midazolam, Ondansetron and Dexamethasone  Airway Management Planned: Natural Airway and Simple Face Mask  Additional Equipment:   Intra-op Plan:   Post-operative Plan: Extubation in OR  Informed Consent: I have reviewed the patients History and Physical, chart, labs and discussed the procedure including the risks, benefits and alternatives for the proposed anesthesia with the patient or authorized representative who has indicated his/her understanding and acceptance.     Dental advisory given  Plan Discussed with: CRNA  Anesthesia Plan Comments:         Anesthesia Quick Evaluation

## 2018-08-08 ENCOUNTER — Encounter (HOSPITAL_BASED_OUTPATIENT_CLINIC_OR_DEPARTMENT_OTHER): Payer: Self-pay | Admitting: Urology

## 2018-08-15 ENCOUNTER — Other Ambulatory Visit: Payer: Self-pay | Admitting: Urology

## 2018-08-15 ENCOUNTER — Other Ambulatory Visit (HOSPITAL_COMMUNITY): Payer: Self-pay | Admitting: Urology

## 2018-08-15 DIAGNOSIS — C61 Malignant neoplasm of prostate: Secondary | ICD-10-CM

## 2018-08-28 ENCOUNTER — Emergency Department (HOSPITAL_COMMUNITY)
Admission: EM | Admit: 2018-08-28 | Discharge: 2018-08-28 | Discharge status: Absent without leave | Payer: Medicaid Other

## 2018-08-28 ENCOUNTER — Other Ambulatory Visit: Payer: Self-pay

## 2018-08-28 ENCOUNTER — Ambulatory Visit (HOSPITAL_COMMUNITY)
Admission: RE | Admit: 2018-08-28 | Discharge: 2018-08-28 | Disposition: A | Discharge status: Home / Self Care | Payer: Medicaid Other | Source: Ambulatory Visit | Attending: Urology | Admitting: Urology

## 2018-08-28 DIAGNOSIS — C61 Malignant neoplasm of prostate: Secondary | ICD-10-CM | POA: Diagnosis present

## 2018-08-28 MED ORDER — TECHNETIUM TC 99M MEDRONATE IV KIT
20.9000 | PACK | Freq: Once | INTRAVENOUS | Status: AC | PRN
  Administered 2018-08-28: 20.9 via INTRAVENOUS

## 2018-08-29 ENCOUNTER — Other Ambulatory Visit: Payer: Self-pay | Admitting: Urology

## 2018-08-29 ENCOUNTER — Encounter: Payer: Self-pay | Admitting: Radiation Oncology

## 2018-08-29 NOTE — Progress Notes (Signed)
GU Location of Tumor / Histology: prostatic adenocarcinoma  If Prostate Cancer, Gleason Score is (4 + 3) and PSA is (42.60). Prostate volume: [place] has a long history of an elevated PSA. Patient has had several prostate biopsies dating back to 2014.    Biopsies of prostate (if applicable) revealed:    Past/Anticipated interventions by urology, if any: prostate biopsy, CT abd/pelvis, Bone scan negative, referral to Dr. Tammi Klippel to discuss radiation options  Past/Anticipated interventions by medical oncology, if any: no  Weight changes, if any: no  Bowel/Bladder complaints, if any:    Nausea/Vomiting, if any: no  Pain issues, if any:  Reports back pain.   SAFETY ISSUES:  Prior radiation?   Pacemaker/ICD?   Possible current pregnancy? no, male patient  Is the patient on methotrexate?   Current Complaints / other details:  58 year old male. Singles. Smokes occasional. Light beer drinkers. Scheduled to follow up with Dr. Lovena Neighbours 08/29/18 to discuss prostatectomy.  Spoke with patient via phone on 08/30/2018 at 1337. Patient wishes to reschedule consult with Dr. Tammi Klippel for a date prior to October 26th. Patient reports he is scheduled for a prostatectomy on 10/26. Scheduling staff notified.

## 2018-08-30 ENCOUNTER — Inpatient Hospital Stay
Admission: RE | Admit: 2018-08-30 | Discharge: 2018-08-30 | Disposition: A | Discharge status: Home / Self Care | Payer: Medicaid Other | Source: Ambulatory Visit | Attending: Radiation Oncology | Admitting: Radiation Oncology

## 2018-08-30 ENCOUNTER — Ambulatory Visit: Admission: RE | Admit: 2018-08-30 | Payer: Medicaid Other | Source: Ambulatory Visit

## 2018-08-30 ENCOUNTER — Ambulatory Visit
Admission: RE | Admit: 2018-08-30 | Discharge: 2018-08-30 | Disposition: A | Discharge status: Home / Self Care | Payer: Medicaid Other | Source: Ambulatory Visit | Attending: Radiation Oncology | Admitting: Radiation Oncology

## 2018-08-30 ENCOUNTER — Telehealth: Payer: Self-pay | Admitting: Radiation Oncology

## 2018-08-30 HISTORY — DX: Malignant neoplasm of prostate: C61

## 2018-08-30 NOTE — Telephone Encounter (Signed)
Phoned patient to begin telephone consult. Patient request to reschedule. Patient states, "Pam called from Dr. Gilford Rile office yesterday and got me all twisted up." Patient states he is scheduled to have a prostatectomy on 11/04/2018. Patient confirms he still wants to discuss radiation with Dr. Tammi Klippel just on a day other than today and before October 26th. Provider informed of these findings. Leonard Downing contacted and requested she reschedule.

## 2018-09-12 ENCOUNTER — Telehealth: Payer: Self-pay | Admitting: Radiation Oncology

## 2018-09-12 NOTE — Telephone Encounter (Signed)
Contacted patient to verify telephone visit for pre reg °

## 2018-09-13 ENCOUNTER — Ambulatory Visit
Admission: RE | Admit: 2018-09-13 | Discharge: 2018-09-13 | Disposition: A | Discharge status: Home / Self Care | Payer: Medicaid Other | Source: Ambulatory Visit | Attending: Radiation Oncology | Admitting: Radiation Oncology

## 2018-09-13 ENCOUNTER — Encounter: Payer: Self-pay | Admitting: Radiation Oncology

## 2018-09-13 ENCOUNTER — Other Ambulatory Visit: Payer: Self-pay

## 2018-09-13 VITALS — Ht 70.0 in | Wt 182.0 lb

## 2018-09-13 DIAGNOSIS — C61 Malignant neoplasm of prostate: Secondary | ICD-10-CM

## 2018-09-13 NOTE — Progress Notes (Signed)
See progress note under physician encounter. 

## 2018-09-13 NOTE — Progress Notes (Signed)
GU Location of Tumor / Histology: prostatic adenocarcinoma  If Prostate Cancer, Gleason Score is (4 + 3) and PSA is (42.60). Prostate volume: [place] has a long history of an elevated PSA. Patient has had several prostate biopsies dating back to 2014.    Biopsies of prostate (if applicable) revealed:    Past/Anticipated interventions by urology, if any: prostate biopsy, CT abd/pelvis, Bone scan negative, referral to Dr. Tammi Klippel to discuss radiation options  Past/Anticipated interventions by medical oncology, if any: no  Weight changes, if any: no  Bowel/Bladder complaints, if any:  IPSS 2. SHIM 19. Denies dysuria, hematuria, urinary leakage or incontinence. Denies any bowel complaints.   Nausea/Vomiting, if any: no  Pain issues, if any:  Reports occasional mild low abdominal pains since being diagnosed with prostate cancer.   SAFETY ISSUES:   Prior radiation? denies  Pacemaker/ICD? denies  Possible current pregnancy? no, male patient  Is the patient on methotrexate? denies  Current Complaints / other details:  59 year old male. Singles. Smokes occasional. Light beer drinkers. Scheduled to follow up with Dr. Lovena Neighbours 08/29/18 to discuss prostatectomy.  Spoke with patient via phone on 08/30/2018 at 1337. Patient wishes to reschedule consult with Dr. Tammi Klippel for a date prior to October 26th. Patient reports he is scheduled for a prostatectomy on 10/26. Scheduling staff notified.

## 2018-09-13 NOTE — Progress Notes (Signed)
Radiation Oncology         [phone] ________________________________  Initial outpatient Consultation - Conducted via Telephone due to current COVID-19 concerns for limiting patient exposure  Name: Raymond [name] MRN: [id number]  Date: 09/13/2018  DOB: Nov 01, 1960  CC:Bonsu, Talmage Coin, DO  Winter, Christopher Aar*   REFERRING PHYSICIAN: Davis Gourd*  DIAGNOSIS: 58 y.o. gentleman with Stage T1c adenocarcinoma of the prostate with Gleason score of 4+3, and PSA of 42.6.    ICD-10-CM   1. Malignant neoplasm of prostate ([place])  C61     HISTORY OF PRESENT ILLNESS: Raymond [name] is a 58 y.o. male with a diagnosis of prostate cancer. He was initially evaluated at [facility] in 2014, with a PSA of 2.69 at that time. When his PSA climbed to 3.96, he underwent his first prostate biopsy on 01/13/2013, which only showed atypia. A prostate MRI performed on 11/26/2013 was negative. His PSA continued to rise, at 4.56 in 06/2013, and he proceeded to a second biopsy on 07/03/2013, which showed HG/PIN only. A third biopsy was performed on 07/15/2014 with a PSA of 9.96, which was entirely benign. PSA rose to 20.4 in 01/2016, prompting a repeat MRI, which was again negative. His PSA has continued to gradually rise, at 31.4 in 02/2017, 25.6 in 09/2017 and his most recent PSA, performed on 03/18/2018, was 42.60. Digital rectal exams remained normal, without nodularity or asymmetry.  The patient proceeded to a fourth transrectal ultrasound with 12 biopsies of the prostate on 08/07/2018.  The prostate volume measured 38 cc.  Out of 12 core biopsies, 5 were positive, all on the right side.  The maximum Gleason score was 4+3, and this was seen in right base lateral, right mid lateral, and right mid medial. Gleason 3+4 was seen in right apex lateral and medial. Of note, a focus of atypical glands was seen in left base lateral.  He underwent disease staging imaging with CT A/P and bone scan on 08/28/2018, both of which  were negative for visceral or osseous metastatic disease.  He met with Dr. Gloriann Loan on 08/29/2018 to further discuss surgical treatment options. He is leaning towards robotic prostatectomy and is tentatively scheduled for 11/04/2018, but wanted to also explore radiation treatment options.  The patient reviewed the biopsy results with his urologist and he has kindly been referred today for discussion of potential radiation treatment options.  PREVIOUS RADIATION THERAPY: No  PAST MEDICAL HISTORY:  Past Medical History:  Diagnosis Date  . Arthritis   . At risk for sleep apnea    STOP-BANG= 4    SENT TO PCP 07-09-2014  . BPH (benign prostatic hypertrophy)   . Dental caries    periodontitis  . Elevated PSA   . History of concussion    yrs ago-- no LOC-- no residual  . History of depression   . Hypertension   . Nocturia   . Prostate cancer ([name])   . Wears glasses       PAST SURGICAL HISTORY: Past Surgical History:  Procedure Laterality Date  . APPENDECTOMY  age 61  . MULTIPLE EXTRACTIONS WITH ALVEOLOPLASTY N/A 10/12/2017   Procedure: MULTIPLE EXTRACTION WITH ALVEOLOPLASTY;  Surgeon: [name], DDS;  Location: [place];  Service: Oral Surgery;  Laterality: N/A;  . PROSTATE BIOPSY N/A 07/15/2014   Procedure: SATURATION BIOPSY TRANSRECTAL ULTRASONIC PROSTATE (TUBP);  Surgeon: [name], MD;  Location: [facility];  Service: Urology;  Laterality: N/A;  . PROSTATE BIOPSY N/A 08/07/2018  Procedure: BIOPSY TRANSRECTAL ULTRASONIC PROSTATE (TUBP);  Surgeon: [name], MD;  Location: [facility];  Service: Urology;  Laterality: N/A;  ONLY NEEDS 30 MIN  . RIGHT KNEE ARTHROSCOPY /  MENISECTOMY/  DEBRIDEMENT OF CHONDROMALACIA PATELLA AND MEDIAL SHELF PLICA/  SUBCHONDROPLASTY  06-10-2010  . TOTAL KNEE ARTHROPLASTY  05/31/2011   Procedure: TOTAL KNEE ARTHROPLASTY;  Surgeon: [name], MD;  Location: [place];  Service: Orthopedics;  Laterality: Right;   with revision stem    FAMILY HISTORY:  Family History  Problem Relation Age of Onset  . Prostate cancer Neg Hx   . Colon cancer Neg Hx   . Breast cancer Neg Hx     SOCIAL HISTORY:  Social History   Socioeconomic History  . Marital status: Significant Other    Spouse name: Not on file  . Number of children: 4  . Years of education: Not on file  . Highest education level: Not on file  Occupational History    Comment: disability  Social Needs  . Financial resource strain: Not on file  . Food insecurity    Worry: Not on file    Inability: Not on file  . Transportation needs    Medical: Not on file    Non-medical: Not on file  Tobacco Use  . Smoking status: Former Smoker    Packs/day: 0.50    Years: 4.00    Pack years: 2.00    Types: Cigarettes    Quit date: 08/28/2018    Years since quitting: 0.0  . Smokeless tobacco: Never Used  Substance and Sexual Activity  . Alcohol use: Yes    Comment: occasional  . Drug use: No    Comment: hx marijuana use  . Sexual activity: Yes  Lifestyle  . Physical activity    Days per week: Not on file    Minutes per session: Not on file  . Stress: Not on file  Relationships  . Social Herbalist on phone: Not on file    Gets together: Not on file    Attends religious service: Not on file    Active member of club or organization: Not on file    Attends meetings of clubs or organizations: Not on file    Relationship status: Not on file  . Intimate partner violence    Fear of current or ex partner: Not on file    Emotionally abused: Not on file    Physically abused: Not on file    Forced sexual activity: Not on file  Other Topics Concern  . Not on file  Social History Narrative   His daughter is a Marine scientist at [employer], who works night shift.    ALLERGIES: Patient has no known allergies.  MEDICATIONS:  Current Outpatient Medications  Medication Sig Dispense Refill  . aspirin EC 81 MG tablet Take 81 mg by  mouth daily.    Marland Kitchen atorvastatin (LIPITOR) 10 MG tablet TK 1 T PO QD    . lisinopril-hydrochlorothiazide (PRINZIDE,ZESTORETIC) 20-25 MG per tablet Take 1 tablet by mouth every morning.    Marland Kitchen gemfibrozil (LOPID) 600 MG tablet Take 600 mg by mouth 2 (two) times daily.  3  . Naphazoline HCl (CLEAR EYES OP) Place 1 drop into both eyes daily as needed (redness).    . traMADol (ULTRAM) 50 MG tablet Take 1 tablet (50 mg total) by mouth every 6 (six) hours as needed. (Patient not taking: Reported on 09/13/2018) 4 tablet  0   No current facility-administered medications for this encounter.    Facility-Administered Medications Ordered in Other Encounters  Medication Dose Route Frequency Provider Last Rate Last Dose  . sodium phosphate (FLEET) 7-19 GM/118ML enema 1 enema  1 enema Rectal Once Winter, Christopher Aaron, MD        REVIEW OF SYSTEMS:  On review of systems, the patient reports that he is doing well overall. He denies any chest pain, shortness of breath, cough, fevers, chills, night sweats, unintended weight changes. He denies any bowel disturbances, and denies abdominal pain, nausea or vomiting. He reports occasional mild low abdominal pains since being diagnosed. His IPSS was 2, indicating very mild urinary symptoms. His SHIM was 19, indicating he has mild erectile dysfunction. A complete review of systems is obtained and is otherwise negative.    PHYSICAL EXAM:  Wt Readings from Last 3 Encounters:  09/13/18 182 lb (82.6 kg)  08/07/18 175 lb 11.2 oz (79.7 kg)  10/12/17 180 lb (81.6 kg)   Temp Readings from Last 3 Encounters:  08/07/18 98 F (36.7 C)  10/12/17 97.7 F (36.5 C)  07/15/14 98 F (36.7 C)   BP Readings from Last 3 Encounters:  08/07/18 (!) 169/95  10/12/17 127/84  07/15/14 135/90   Pulse Readings from Last 3 Encounters:  08/07/18 62  10/12/17 69  07/15/14 65   Pain Assessment Pain Score: 1 (reports [email or web address]/ abd pains since learning of prostate dx) Pain Frequency:  Occasional Pain Loc: Abdomen/10  Unable to assess due to telephone consult visit format.  KPS = 100  100 - Normal; no complaints; no evidence of disease. 90   - Able to carry on normal activity; minor signs or symptoms of disease. 80   - Normal activity with effort; some signs or symptoms of disease. 47   - Cares for self; unable to carry on normal activity or to do active work. 60   - Requires occasional assistance, but is able to care for most of his personal needs. 50   - Requires considerable assistance and frequent medical care. 70   - Disabled; requires special care and assistance. 49   - Severely disabled; hospital admission is indicated although death not imminent. 62   - Very sick; hospital admission necessary; active supportive treatment necessary. 10   - Moribund; fatal processes progressing rapidly. 0     - Dead  Karnofsky DA, Abelmann Wilberforce, Craver LS and Burchenal [facility] [phone]) The use of the nitrogen mustards in the palliative treatment of carcinoma: with particular reference to bronchogenic carcinoma Cancer 1 634-56  LABORATORY DATA:  Lab Results  Component Value Date   WBC 5.7 10/12/2017   HGB 17.7 (H) 08/07/2018   HCT 52.0 08/07/2018   MCV 88.2 10/12/2017   PLT 203 10/12/2017   Lab Results  Component Value Date   NA 138 08/07/2018   K 3.8 08/07/2018   CL 107 08/07/2018   CO2 25 10/12/2017   Lab Results  Component Value Date   ALT 53 05/25/2011   AST 27 05/25/2011   ALKPHOS 39 05/25/2011   BILITOT 0.6 05/25/2011     RADIOGRAPHY: Nm Bone Scan Whole Body  Result Date: 08/28/2018 CLINICAL DATA:  Prostate cancer with elevated PSA =42.6 on 03/18/2018, prior RIGHT TKA 05/31/2011, intermittent LEFT hip pain for 1 year EXAM: NUCLEAR MEDICINE WHOLE BODY BONE SCAN TECHNIQUE: Whole body anterior and posterior images were obtained approximately 3 hours after intravenous injection of radiopharmaceutical. RADIOPHARMACEUTICALS:  20.9 mCi Technetium-61mMDP  IV COMPARISON:   None Radiographic correlation: None recent FINDINGS: RIGHT knee prosthesis. Minimal uptake RIGHT mandible and maxilla likely dental in origin. Uptake LEFT first MTP joint, typically degenerative. No abnormal sites of osseous tracer accumulation are seen to suggest osseous metastatic disease. Expected urinary tract and soft tissue distribution of tracer. IMPRESSION: No scintigraphic evidence of osseous metastatic disease. Electronically Signed   By: Lavonia Dana M.D.   On: 08/28/2018 16:20      IMPRESSION/PLAN: 1. 58 y.o. gentleman with Stage T1c adenocarcinoma of the prostate with Gleason Score of 4+3, and PSA of 42.6. We discussed the patient's workup and outlined the nature of prostate cancer in this setting. The patient's T stage, Gleason's score, and PSA put him into the high risk group. Accordingly, he is eligible for a variety of potential treatment options including prostatectomy, or LT-ADT in combination with either 8 weeks of external radiation or 5 weeks of external radiation combined with a brachytherapy boost. We discussed the available radiation techniques, and focused on the details and logistics and delivery. We discussed and outlined the risks, benefits, short and long-term effects associated with radiotherapy and compared and contrasted these with prostatectomy. We also detailed the role of ADT in the treatment of high risk prostate cancer and outlined the associated side effects that could be expected with this therapy.  The patient focused most of his questions and interest in robotic-assisted laparoscopic radical prostatectomy.  We discussed some of the potential advantages of surgery including surgical staging, the availability of salvage radiotherapy to the prostatic fossa, and the confidence associated with immediate biochemical response.  We discussed some of the potential proven indications for postoperative radiotherapy including positive margins, extracapsular extension, and  seminal vesicle involvement. We also talked about some of the other potential findings leading to a recommendation for radiotherapy including a non-zero postoperative PSA and positive lymph nodes.   At the end of the conversation the patient is interested in moving forward with prostatectomy. We will share our discussion with Dr. Lovena Neighbours and Dr. Gloriann Loan. We enjoyed meeting with him today, and will look forward to following his progress.  Of course, we would be more than happy to continue to participate in his care should he elect to proceed with radiation or should there be any indication for postoperative radiation in the future.  Given current concerns for patient exposure during the COVID-19 pandemic, this encounter was conducted via telephone. The patient was notified in advance and was offered a MyChart meeting to allow for face to face communication but unfortunately reported that he did not have the appropriate resources/technology to support such a visit and instead preferred to proceed with telephone consult. The patient has given verbal consent for this type of encounter. The time spent during this encounter was 40 minutes. The attendants for this meeting include Tyler Pita MD, Ashlyn Bruning PA-C, [place], and patient. Ernestina Penna. During the encounter, Tyler Pita MD, Ashlyn Bruning PA-C, and scribe, Wilburn Mylar were located at [place].  Patient, Ranbir Chew was located at home.    Nicholos Johns, PA-C    Tyler Pita, MD  [facility] Direct Dial: [phone]  Fax: [phone] [email or web address]  Skype  [email or web address]  This document serves as a record of services personally performed by Tyler Pita, MD and Freeman Caldron, PA-C. It was created on their behalf by Wilburn Mylar, a trained medical scribe. The creation of this record is based on the  scribe's personal observations and  the provider's statements to them. This document has been checked and approved by the attending provider.

## 2018-09-24 ENCOUNTER — Encounter: Payer: Self-pay | Admitting: Medical Oncology

## 2018-09-24 NOTE — Progress Notes (Signed)
Raymond [name] consulted with Dr. Tammi Klippel regarding his radiation options. He was leaning towards surgery and after consult, finalized his decision to move forward. He is scheduled for 11/04/18

## 2018-10-28 NOTE — Patient Instructions (Addendum)
DUE TO COVID-19 ONLY ONE VISITOR IS ALLOWED TO COME WITH YOU AND STAY IN THE WAITING ROOM ONLY DURING PRE OP AND PROCEDURE DAY OF SURGERY. THE 1 VISITOR MAY VISIT WITH YOU AFTER SURGERY IN YOUR PRIVATE ROOM DURING VISITING HOURS ONLY!  YOU NEED TO HAVE A COVID 19 TEST ON_ Thursday 10/22/2020______ @___1220  pm____, THIS TEST MUST BE DONE BEFORE SURGERY, COME  [address], [place] , [phone].  ([place]) ONCE YOUR COVID TEST IS COMPLETED, PLEASE BEGIN THE QUARANTINE INSTRUCTIONS AS OUTLINED IN YOUR HANDOUT.                [name]    Your procedure is scheduled on: Monday 11/04/2018   Report to [facility] Main  Entrance    Report to Short Stay at  [place] AM     Call this number if you have problems the morning of surgery [phone]    Remember: Do not eat food :After Midnight on Saturday 11/02/2018!  You will be doing a bowel prep and drinking clear liquids all day on Sunday 11/03/2018!              Follow the Bowel Prep instructions from Dr. Purvis Sheffield office the day before surgery and follow a clear liquid diet all day up until midnight!    CLEAR LIQUID DIET   Foods Allowed                                                                     Foods Excluded  Coffee and tea, regular and decaf                             liquids that you cannot  Plain Jell-O any favor except red or purple                                           see through such as: Fruit ices (not with fruit pulp)                                     milk, soups, orange juice  Iced Popsicles                                    All solid food Carbonated beverages, regular and diet                                    Cranberry, grape and apple juices Sports drinks like Gatorade Lightly seasoned clear broth or consume(fat free) Sugar, honey syrup  Sample Menu Breakfast                                Lunch  Supper Cranberry juice                     Beef broth                            Chicken broth Jell-O                                     Grape juice                           Apple juice Coffee or tea                        Jell-O                                      Popsicle                                                Coffee or tea                        Coffee or tea  _____________________________________________________________________               Morning of surgery, Use one Fleet enema 2-3 hours prior to arrival to hospital!    [place], NO CHEWING GUM [place].     Take these medicines the morning of surgery with A SIP OF WATER: none                                 You may not have any metal on your body including hair pins and              piercings  Do not wear jewelry, make-up, lotions, powders or perfumes, deodorant                          Men may shave face and neck.   Do not bring valuables to the hospital. [place].  Contacts, dentures or bridgework may not be worn into surgery.  Leave suitcase in the car. After surgery it may be brought to your room.                  Please read over the following fact sheets you were given: _____________________________________________________________________             [facility] - Preparing for Surgery Before surgery, you can play an important role.  Because skin is not sterile, your skin needs to be as free of germs as possible.  You can reduce the number of germs on your skin by washing with CHG (chlorahexidine gluconate) soap before surgery.  CHG is an antiseptic cleaner which kills germs and bonds with the skin to continue killing germs even after washing. Please DO  NOT use if you have an allergy to CHG or antibacterial soaps.  If your skin becomes reddened/irritated stop using the CHG and inform your nurse when you arrive at Short Stay. Do not shave  (including legs and underarms) for at least 48 hours prior to the first CHG shower.  You may shave your face/neck. Please follow these instructions carefully:  1.  Shower with CHG Soap the night before surgery and the  morning of Surgery.  2.  If you choose to wash your hair, wash your hair first as usual with your  normal  shampoo.  3.  After you shampoo, rinse your hair and body thoroughly to remove the  shampoo.                           4.  Use CHG as you would any other liquid soap.  You can apply chg directly  to the skin and wash                       Gently with a scrungie or clean washcloth.  5.  Apply the CHG Soap to your body ONLY FROM THE NECK DOWN.   Do not use on face/ open                           Wound or open sores. Avoid contact with eyes, ears mouth and genitals (private parts).                       Wash face,  Genitals (private parts) with your normal soap.             6.  Wash thoroughly, paying special attention to the area where your surgery  will be performed.  7.  Thoroughly rinse your body with warm water from the neck down.  8.  DO NOT shower/wash with your normal soap after using and rinsing off  the CHG Soap.                9.  Pat yourself dry with a clean towel.            10.  Wear clean pajamas.            11.  Place clean sheets on your bed the night of your first shower and do not  sleep with pets. Day of Surgery : Do not apply any lotions/deodorants the morning of surgery.  Please wear clean clothes to the hospital/surgery center.  FAILURE TO FOLLOW THESE INSTRUCTIONS MAY RESULT IN THE CANCELLATION OF YOUR SURGERY PATIENT SIGNATURE_________________________________  NURSE SIGNATURE__________________________________  ________________________________________________________________________  WHAT IS A BLOOD TRANSFUSION? Blood Transfusion Information  A transfusion is the replacement of blood or some of its parts. Blood is made up of multiple cells which  provide different functions.  Red blood cells carry oxygen and are used for blood loss replacement.  White blood cells fight against infection.  Platelets control bleeding.  Plasma helps clot blood.  Other blood products are available for specialized needs, such as hemophilia or other clotting disorders. BEFORE THE TRANSFUSION  Who gives blood for transfusions?   Healthy volunteers who are fully evaluated to make sure their blood is safe. This is blood bank blood. Transfusion therapy is the safest it has ever been in the practice of medicine. Before blood is taken from a donor, a complete history  is taken to make sure that person has no history of diseases nor engages in risky social behavior (examples are intravenous drug use or sexual activity with multiple partners). The donor's travel history is screened to minimize risk of transmitting infections, such as malaria. The donated blood is tested for signs of infectious diseases, such as HIV and hepatitis. The blood is then tested to be sure it is compatible with you in order to minimize the chance of a transfusion reaction. If you or a relative donates blood, this is often done in anticipation of surgery and is not appropriate for emergency situations. It takes many days to process the donated blood. RISKS AND COMPLICATIONS Although transfusion therapy is very safe and saves many lives, the main dangers of transfusion include:   Getting an infectious disease.  Developing a transfusion reaction. This is an allergic reaction to something in the blood you were given. Every precaution is taken to prevent this. The decision to have a blood transfusion has been considered carefully by your caregiver before blood is given. Blood is not given unless the benefits outweigh the risks. AFTER THE TRANSFUSION  Right after receiving a blood transfusion, you will usually feel much better and more energetic. This is especially true if your red blood cells  have gotten low (anemic). The transfusion raises the level of the red blood cells which carry oxygen, and this usually causes an energy increase.  The nurse administering the transfusion will monitor you carefully for complications. HOME CARE INSTRUCTIONS  No special instructions are needed after a transfusion. You may find your energy is better. Speak with your caregiver about any limitations on activity for underlying diseases you may have. SEEK MEDICAL CARE IF:   Your condition is not improving after your transfusion.  You develop redness or irritation at the intravenous (IV) site. SEEK IMMEDIATE MEDICAL CARE IF:  Any of the following symptoms occur over the next 12 hours:  Shaking chills.  You have a temperature by mouth above 102 F (38.9 C), not controlled by medicine.  Chest, back, or muscle pain.  People around you feel you are not acting correctly or are confused.  Shortness of breath or difficulty breathing.  Dizziness and fainting.  You get a rash or develop hives.  You have a decrease in urine output.  Your urine turns a dark color or changes to pink, red, or brown. Any of the following symptoms occur over the next 10 days:  You have a temperature by mouth above 102 F (38.9 C), not controlled by medicine.  Shortness of breath.  Weakness after normal activity.  The white part of the eye turns yellow (jaundice).  You have a decrease in the amount of urine or are urinating less often.  Your urine turns a dark color or changes to pink, red, or brown. Document Released: 12/24/1999 Document Revised: 03/20/2011 Document Reviewed: 08/12/2007 [facility] Patient Information 2014 [place], [place].  _______________________________________________________________________

## 2018-10-30 ENCOUNTER — Other Ambulatory Visit: Payer: Self-pay

## 2018-10-30 ENCOUNTER — Encounter (HOSPITAL_COMMUNITY)
Admission: RE | Admit: 2018-10-30 | Discharge: 2018-10-30 | Disposition: A | Discharge status: Home / Self Care | Payer: Medicaid Other | Source: Ambulatory Visit | Attending: Urology | Admitting: Urology

## 2018-10-30 ENCOUNTER — Encounter (HOSPITAL_COMMUNITY): Payer: Self-pay

## 2018-10-30 DIAGNOSIS — Z79899 Other long term (current) drug therapy: Secondary | ICD-10-CM | POA: Insufficient documentation

## 2018-10-30 DIAGNOSIS — I10 Essential (primary) hypertension: Secondary | ICD-10-CM | POA: Diagnosis present

## 2018-10-30 DIAGNOSIS — Z87891 Personal history of nicotine dependence: Secondary | ICD-10-CM | POA: Insufficient documentation

## 2018-10-30 DIAGNOSIS — M1990 Unspecified osteoarthritis, unspecified site: Secondary | ICD-10-CM | POA: Diagnosis not present

## 2018-10-30 DIAGNOSIS — Z8546 Personal history of malignant neoplasm of prostate: Secondary | ICD-10-CM | POA: Diagnosis not present

## 2018-10-30 DIAGNOSIS — Z7982 Long term (current) use of aspirin: Secondary | ICD-10-CM | POA: Insufficient documentation

## 2018-10-30 DIAGNOSIS — N400 Enlarged prostate without lower urinary tract symptoms: Secondary | ICD-10-CM | POA: Diagnosis not present

## 2018-10-30 LAB — BASIC METABOLIC PANEL
Anion gap: 12 (ref 5–15)
BUN: 14 mg/dL (ref 6–20)
CO2: 28 mmol/L (ref 22–32)
Calcium: 10.3 mg/dL (ref 8.9–10.3)
Chloride: 99 mmol/L (ref 98–111)
Creatinine, Ser: 1.14 mg/dL (ref 0.61–1.24)
GFR calc Af Amer: >60 mL/min (ref >60)
GFR calc non Af Amer: >60 mL/min (ref >60)
Glucose, Bld: 107 mg/dL — ABNORMAL HIGH (ref 70–99)
Potassium: 4.2 mmol/L (ref 3.5–5.1)
Sodium: 139 mmol/L (ref 135–145)

## 2018-10-30 LAB — CBC
HCT: 51.5 % (ref 39.0–52.0)
Hemoglobin: 16 g/dL (ref 13.0–17.0)
MCH: 27.1 pg (ref 26.0–34.0)
MCHC: 31.1 g/dL (ref 30.0–36.0)
MCV: 87.3 fL (ref 80.0–100.0)
Platelets: 257 10*3/uL (ref 150–400)
RBC: 5.9 MIL/uL — ABNORMAL HIGH (ref 4.22–5.81)
RDW: 14 % (ref 11.5–15.5)
WBC: 6 10*3/uL (ref 4.0–10.5)
nRBC: 0 % (ref 0.0–0.2)

## 2018-10-30 LAB — PROTIME-INR
INR: 0.9 (ref 0.8–1.2)
Prothrombin Time: 12.3 seconds (ref 11.4–15.2)

## 2018-10-30 LAB — ABO/RH: ABO/RH(D): A NEG

## 2018-10-30 NOTE — Progress Notes (Addendum)
PCP - Dr. Emilee Hero Bonsu  [address]  [phone]  Cardiologist - none  Chest x-ray -none  08/28/2018- Epic- NM Bone scan whole body   EKG - 10/30/2018 Stress Test - none ECHO - none Cardiac Cath - none  Sleep Study - none CPAP - none  Fasting Blood Sugar - none Checks Blood Sugar _____ times a day  Blood Thinner Instructions:none Aspirin Instructions:Aspirin 81 mg- preventative from Dr. Orma Render Last Dose:10/27/2018  Anesthesia review:  Called Myra Gianotti, PA  or Karoline Caldwell, PA to review chart.  Patient has history of HTN, Prostate cancer, Alcohol use, tobacco use and cocaine and marjuana use.  Patient denies shortness of breath, fever, cough and chest pain at PAT appointment   Patient verbalized understanding of instructions that were given to them at the PAT appointment. Patient was also instructed that they will need to review over the PAT instructions again at home before surgery.

## 2018-10-31 ENCOUNTER — Other Ambulatory Visit (HOSPITAL_COMMUNITY)
Admission: RE | Admit: 2018-10-31 | Discharge: 2018-10-31 | Disposition: A | Discharge status: Home / Self Care | Payer: Medicaid Other | Source: Ambulatory Visit | Attending: Urology | Admitting: Urology

## 2018-10-31 DIAGNOSIS — Z20828 Contact with and (suspected) exposure to other viral communicable diseases: Secondary | ICD-10-CM | POA: Insufficient documentation

## 2018-10-31 DIAGNOSIS — Z01812 Encounter for preprocedural laboratory examination: Secondary | ICD-10-CM | POA: Insufficient documentation

## 2018-10-31 NOTE — Progress Notes (Signed)
Anesthesia Chart Review:   Case: [id number] Date/Time: 11/04/18 0700   Procedures:      XI ROBOTIC ASSISTED LAPAROSCOPIC RADICAL PROSTATECTOMY (N/A )     PELVIC LYMPH NODE DISSECTION (Bilateral )   Anesthesia type: General   Pre-op diagnosis: PROSTATE CANCER   Location: [facility] ROOM 03 / [facility]   Surgeon: [name], MD      DISCUSSION:  - Pt is a 58 year old male with hx HTN. Former smoker quit 08/28/18  - Hx cocaine and marijuana use ~ 10 years ago    VS: BP 136/82   Pulse 74   Temp 36.9 C (Oral)   Resp 16   Ht 5\' 11"  (1.803 m)   Wt 83.6 kg   SpO2 99%   BMI 25.72 kg/m    PROVIDERS: - PCP is Bonsu, Osei A, DO   LABS: Labs reviewed: Acceptable for surgery. (all labs ordered are listed, but only abnormal results are displayed)  Labs Reviewed  BASIC METABOLIC PANEL - Abnormal; Notable for the following components:      Result Value   Glucose, Bld 107 (*)    All other components within normal limits  CBC - Abnormal; Notable for the following components:   RBC 5.90 (*)    All other components within normal limits  PROTIME-INR  TYPE AND SCREEN  ABO/RH     EKG 10/30/18: NSR. Nonspecific T wave abnormality   CV: N/A   Past Medical History:  Diagnosis Date  . Arthritis   . At risk for sleep apnea    STOP-BANG= 4    SENT TO PCP 07-09-2014  . BPH (benign prostatic hypertrophy)   . Dental caries    periodontitis  . Depression   . Elevated PSA   . History of concussion    yrs ago-- no LOC-- no residual  . History of depression   . Hypertension   . Nocturia   . Prostate cancer ([place])   . Wears glasses     Past Surgical History:  Procedure Laterality Date  . APPENDECTOMY  age 89  . MULTIPLE EXTRACTIONS WITH ALVEOLOPLASTY N/A 10/12/2017   Procedure: MULTIPLE EXTRACTION WITH ALVEOLOPLASTY;  Surgeon: [name], DDS;  Location: [facility];  Service: Oral Surgery;  Laterality: N/A;  . PROSTATE BIOPSY N/A 07/15/2014   Procedure: SATURATION BIOPSY TRANSRECTAL  ULTRASONIC PROSTATE (TUBP);  Surgeon: [name], MD;  Location: [facility];  Service: Urology;  Laterality: N/A;  . PROSTATE BIOPSY N/A 08/07/2018   Procedure: BIOPSY TRANSRECTAL ULTRASONIC PROSTATE (TUBP);  Surgeon: [name], MD;  Location: [facility];  Service: Urology;  Laterality: N/A;  ONLY NEEDS 30 MIN  . RIGHT KNEE ARTHROSCOPY /  MENISECTOMY/  DEBRIDEMENT OF CHONDROMALACIA PATELLA AND MEDIAL SHELF PLICA/  SUBCHONDROPLASTY  06-10-2010  . TOTAL KNEE ARTHROPLASTY  05/31/2011   Procedure: TOTAL KNEE ARTHROPLASTY;  Surgeon: [name], MD;  Location: [place];  Service: Orthopedics;  Laterality: Right;  with revision stem    MEDICATIONS: . aspirin EC 81 MG tablet  . gemfibrozil (LOPID) 600 MG tablet  . lisinopril-hydrochlorothiazide (PRINZIDE,ZESTORETIC) 20-25 MG per tablet  . sildenafil (REVATIO) 20 MG tablet  . traMADol (ULTRAM) 50 MG tablet   No current facility-administered medications for this encounter.    . sodium phosphate (FLEET) 7-19 GM/118ML enema 1 enema    If no changes, I anticipate pt can proceed with surgery as scheduled.   Willeen Cass, FNP-BC [facility]/Anesthesiology  Phone: [phone] 10/31/2018 10:26 AM

## 2018-11-01 LAB — NOVEL CORONAVIRUS, NAA (HOSP ORDER, SEND-OUT TO REF LAB; TAT 18-24 HRS): SARS-CoV-2, NAA: NOT DETECTED

## 2018-11-03 NOTE — Anesthesia Preprocedure Evaluation (Addendum)
Anesthesia Evaluation  Patient identified by MRN, date of birth, ID band Patient awake    Reviewed: Allergy & Precautions, NPO status , Patient's Chart, lab work & pertinent test results  Airway Mallampati: I  TM Distance: >3 FB Neck ROM: Full    Dental no notable dental hx. (+) Missing, Poor Dentition, Dental Advisory Given,    Pulmonary neg pulmonary ROS, Current Smoker and Patient abstained from smoking., former smoker,    Pulmonary exam normal breath sounds clear to auscultation       Cardiovascular hypertension, Normal cardiovascular exam Rhythm:Regular Rate:Normal     Neuro/Psych PSYCHIATRIC DISORDERS Depression negative neurological ROS     GI/Hepatic negative GI ROS, Neg liver ROS,   Endo/Other  negative endocrine ROS  Renal/GU negative Renal ROS  negative genitourinary   Musculoskeletal  (+) Arthritis ,   Abdominal   Peds  Hematology negative hematology ROS (+)   Anesthesia Other Findings   Reproductive/Obstetrics                            Anesthesia Physical  Anesthesia Plan  ASA: III  Anesthesia Plan: General   Post-op Pain Management:    Induction: Intravenous  PONV Risk Score and Plan: 4 or greater and Treatment may vary due to age or medical condition, Midazolam, Ondansetron and Dexamethasone  Airway Management Planned: Oral ETT  Additional Equipment: None  Intra-op Plan:   Post-operative Plan: Extubation in OR  Informed Consent: I have reviewed the patients History and Physical, chart, labs and discussed the procedure including the risks, benefits and alternatives for the proposed anesthesia with the patient or authorized representative who has indicated his/her understanding and acceptance.     Dental advisory given  Plan Discussed with: CRNA  Anesthesia Plan Comments:        Anesthesia Quick Evaluation

## 2018-11-04 ENCOUNTER — Observation Stay (HOSPITAL_COMMUNITY)
Admission: RE | Admit: 2018-11-04 | Discharge: 2018-11-05 | Disposition: A | Discharge status: Home / Self Care | Payer: Medicaid Other | Attending: Urology | Admitting: Urology

## 2018-11-04 ENCOUNTER — Encounter (HOSPITAL_COMMUNITY)
Admission: RE | Disposition: A | Discharge status: Home / Self Care | Payer: Self-pay | Source: Home / Self Care | Attending: Urology

## 2018-11-04 ENCOUNTER — Ambulatory Visit (HOSPITAL_COMMUNITY): Payer: Medicaid Other

## 2018-11-04 ENCOUNTER — Other Ambulatory Visit: Payer: Self-pay

## 2018-11-04 ENCOUNTER — Ambulatory Visit (HOSPITAL_COMMUNITY): Payer: Medicaid Other | Admitting: Vascular Surgery

## 2018-11-04 ENCOUNTER — Encounter (HOSPITAL_COMMUNITY): Payer: Self-pay | Admitting: *Deleted

## 2018-11-04 DIAGNOSIS — Z7982 Long term (current) use of aspirin: Secondary | ICD-10-CM | POA: Insufficient documentation

## 2018-11-04 DIAGNOSIS — C61 Malignant neoplasm of prostate: Secondary | ICD-10-CM | POA: Diagnosis present

## 2018-11-04 DIAGNOSIS — F17210 Nicotine dependence, cigarettes, uncomplicated: Secondary | ICD-10-CM | POA: Insufficient documentation

## 2018-11-04 DIAGNOSIS — I10 Essential (primary) hypertension: Secondary | ICD-10-CM | POA: Diagnosis not present

## 2018-11-04 DIAGNOSIS — Z79899 Other long term (current) drug therapy: Secondary | ICD-10-CM | POA: Insufficient documentation

## 2018-11-04 DIAGNOSIS — N529 Male erectile dysfunction, unspecified: Secondary | ICD-10-CM | POA: Diagnosis not present

## 2018-11-04 DIAGNOSIS — Z96659 Presence of unspecified artificial knee joint: Secondary | ICD-10-CM | POA: Diagnosis not present

## 2018-11-04 DIAGNOSIS — E7800 Pure hypercholesterolemia, unspecified: Secondary | ICD-10-CM | POA: Diagnosis not present

## 2018-11-04 DIAGNOSIS — F329 Major depressive disorder, single episode, unspecified: Secondary | ICD-10-CM | POA: Insufficient documentation

## 2018-11-04 DIAGNOSIS — M1990 Unspecified osteoarthritis, unspecified site: Secondary | ICD-10-CM | POA: Diagnosis not present

## 2018-11-04 HISTORY — PX: ROBOT ASSISTED LAPAROSCOPIC RADICAL PROSTATECTOMY: SHX5141

## 2018-11-04 HISTORY — PX: PELVIC LYMPH NODE DISSECTION: SHX6543

## 2018-11-04 LAB — HEMOGLOBIN AND HEMATOCRIT, BLOOD
HCT: 40.5 % (ref 39.0–52.0)
Hemoglobin: 12.4 g/dL — ABNORMAL LOW (ref 13.0–17.0)

## 2018-11-04 LAB — TYPE AND SCREEN
ABO/RH(D): A NEG
Antibody Screen: NEGATIVE

## 2018-11-04 SURGERY — PROSTATECTOMY, RADICAL, ROBOT-ASSISTED, LAPAROSCOPIC
Anesthesia: General

## 2018-11-04 MED ORDER — LISINOPRIL-HYDROCHLOROTHIAZIDE 20-25 MG PO TABS: 1.0000 | ORAL_TABLET | Freq: Every morning | ORAL | Status: DC

## 2018-11-04 MED ORDER — SUGAMMADEX SODIUM 200 MG/2ML IV SOLN
INTRAVENOUS | Status: DC | PRN
  Administered 2018-11-04: 200 mg via INTRAVENOUS

## 2018-11-04 MED ORDER — BELLADONNA ALKALOIDS-OPIUM 16.2-60 MG RE SUPP
RECTAL | Status: AC
  Filled 2018-11-04: qty 1

## 2018-11-04 MED ORDER — DIPHENHYDRAMINE HCL 12.5 MG/5ML PO ELIX: 12.5000 mg | ORAL_SOLUTION | Freq: Four times a day (QID) | ORAL | Status: DC | PRN

## 2018-11-04 MED ORDER — HYDROMORPHONE HCL 1 MG/ML IJ SOLN
INTRAMUSCULAR | Status: DC | PRN
  Administered 2018-11-04 (×3): .4 mg via INTRAVENOUS

## 2018-11-04 MED ORDER — PROPOFOL 500 MG/50ML IV EMUL
INTRAVENOUS | Status: AC
  Filled 2018-11-04: qty 50

## 2018-11-04 MED ORDER — PHENYLEPHRINE 40 MCG/ML (10ML) SYRINGE FOR IV PUSH (FOR BLOOD PRESSURE SUPPORT)
PREFILLED_SYRINGE | INTRAVENOUS | Status: AC
  Filled 2018-11-04: qty 10

## 2018-11-04 MED ORDER — DIPHENHYDRAMINE HCL 50 MG/ML IJ SOLN: 12.5000 mg | Freq: Four times a day (QID) | INTRAMUSCULAR | Status: DC | PRN

## 2018-11-04 MED ORDER — SODIUM CHLORIDE 0.9 % IV BOLUS
1000.0000 mL | Freq: Once | INTRAVENOUS | Status: AC
  Administered 2018-11-04: 11:00:00 1000 mL via INTRAVENOUS

## 2018-11-04 MED ORDER — BUPIVACAINE LIPOSOME 1.3 % IJ SUSP
20.0000 mL | Freq: Once | INTRAMUSCULAR | Status: AC
  Administered 2018-11-04: 20 mL
  Filled 2018-11-04: qty 20

## 2018-11-04 MED ORDER — ROCURONIUM BROMIDE 10 MG/ML (PF) SYRINGE
PREFILLED_SYRINGE | INTRAVENOUS | Status: AC
  Filled 2018-11-04: qty 10

## 2018-11-04 MED ORDER — OXYCODONE HCL 5 MG PO TABS
ORAL_TABLET | ORAL | Status: AC
  Filled 2018-11-04: qty 1

## 2018-11-04 MED ORDER — LIDOCAINE HCL 2 % IJ SOLN
INTRAMUSCULAR | Status: AC
  Filled 2018-11-04: qty 20

## 2018-11-04 MED ORDER — LIDOCAINE 20MG/ML (2%) 15 ML SYRINGE OPTIME
INTRAMUSCULAR | Status: DC | PRN
  Administered 2018-11-04: 1.5 mg/kg/h via INTRAVENOUS

## 2018-11-04 MED ORDER — MIDAZOLAM HCL 5 MG/5ML IJ SOLN
INTRAMUSCULAR | Status: DC | PRN
  Administered 2018-11-04: 2 mg via INTRAVENOUS

## 2018-11-04 MED ORDER — PROMETHAZINE HCL 25 MG/ML IJ SOLN: 6.2500 mg | INTRAMUSCULAR | Status: DC | PRN

## 2018-11-04 MED ORDER — DEXAMETHASONE SODIUM PHOSPHATE 10 MG/ML IJ SOLN
INTRAMUSCULAR | Status: DC | PRN
  Administered 2018-11-04: 10 mg via INTRAVENOUS

## 2018-11-04 MED ORDER — PROPOFOL 10 MG/ML IV BOLUS
INTRAVENOUS | Status: AC
  Filled 2018-11-04: qty 20

## 2018-11-04 MED ORDER — PHENYLEPHRINE 40 MCG/ML (10ML) SYRINGE FOR IV PUSH (FOR BLOOD PRESSURE SUPPORT)
PREFILLED_SYRINGE | INTRAVENOUS | Status: DC | PRN
  Administered 2018-11-04 (×3): 80 ug via INTRAVENOUS

## 2018-11-04 MED ORDER — LISINOPRIL 20 MG PO TABS
20.0000 mg | ORAL_TABLET | Freq: Every day | ORAL | Status: DC
  Administered 2018-11-05: 20 mg via ORAL
  Filled 2018-11-04: qty 1

## 2018-11-04 MED ORDER — TRAMADOL HCL 50 MG PO TABS: 50.0000 mg | ORAL_TABLET | Freq: Four times a day (QID) | ORAL | 0 refills | Status: DC | PRN

## 2018-11-04 MED ORDER — BACITRACIN-NEOMYCIN-POLYMYXIN 400-5-5000 EX OINT: 1.0000 "application " | TOPICAL_OINTMENT | Freq: Three times a day (TID) | CUTANEOUS | Status: DC | PRN

## 2018-11-04 MED ORDER — EPHEDRINE SULFATE-NACL 50-0.9 MG/10ML-% IV SOSY
PREFILLED_SYRINGE | INTRAVENOUS | Status: DC | PRN
  Administered 2018-11-04 (×2): 10 mg via INTRAVENOUS

## 2018-11-04 MED ORDER — LACTATED RINGERS IV SOLN
INTRAVENOUS | Status: DC
  Administered 2018-11-04 (×3): via INTRAVENOUS

## 2018-11-04 MED ORDER — DEXAMETHASONE SODIUM PHOSPHATE 10 MG/ML IJ SOLN
INTRAMUSCULAR | Status: AC
  Filled 2018-11-04: qty 1

## 2018-11-04 MED ORDER — KETAMINE HCL 10 MG/ML IJ SOLN
INTRAMUSCULAR | Status: AC
  Filled 2018-11-04: qty 1

## 2018-11-04 MED ORDER — FENTANYL CITRATE (PF) 250 MCG/5ML IJ SOLN
INTRAMUSCULAR | Status: DC | PRN
  Administered 2018-11-04: 50 ug via INTRAVENOUS
  Administered 2018-11-04 (×2): 100 ug via INTRAVENOUS

## 2018-11-04 MED ORDER — GEMFIBROZIL 600 MG PO TABS
600.0000 mg | ORAL_TABLET | Freq: Two times a day (BID) | ORAL | Status: DC
  Administered 2018-11-04 – 2018-11-05 (×2): 600 mg via ORAL
  Filled 2018-11-04 (×4): qty 1

## 2018-11-04 MED ORDER — HYDROMORPHONE HCL 1 MG/ML IJ SOLN
INTRAMUSCULAR | Status: AC
  Filled 2018-11-04: qty 1

## 2018-11-04 MED ORDER — ONDANSETRON HCL 4 MG/2ML IJ SOLN
INTRAMUSCULAR | Status: DC | PRN
  Administered 2018-11-04: 4 mg via INTRAVENOUS

## 2018-11-04 MED ORDER — SODIUM CHLORIDE (PF) 0.9 % IJ SOLN
INTRAMUSCULAR | Status: DC | PRN
  Administered 2018-11-04: 20 mL

## 2018-11-04 MED ORDER — HYDROCHLOROTHIAZIDE 25 MG PO TABS
25.0000 mg | ORAL_TABLET | Freq: Every day | ORAL | Status: DC
  Administered 2018-11-05: 25 mg via ORAL
  Filled 2018-11-04: qty 1

## 2018-11-04 MED ORDER — MEPERIDINE HCL 50 MG/ML IJ SOLN: 6.2500 mg | INTRAMUSCULAR | Status: DC | PRN

## 2018-11-04 MED ORDER — ALBUMIN HUMAN 5 % IV SOLN
INTRAVENOUS | Status: DC | PRN
  Administered 2018-11-04: 10:00:00 via INTRAVENOUS

## 2018-11-04 MED ORDER — MIDAZOLAM HCL 2 MG/2ML IJ SOLN
INTRAMUSCULAR | Status: AC
  Filled 2018-11-04: qty 2

## 2018-11-04 MED ORDER — OXYCODONE HCL 5 MG PO TABS
5.0000 mg | ORAL_TABLET | ORAL | Status: DC | PRN
  Administered 2018-11-04 – 2018-11-05 (×5): 5 mg via ORAL
  Filled 2018-11-04 (×4): qty 1

## 2018-11-04 MED ORDER — HYDROMORPHONE HCL 2 MG/ML IJ SOLN
INTRAMUSCULAR | Status: AC
  Filled 2018-11-04: qty 1

## 2018-11-04 MED ORDER — BELLADONNA ALKALOIDS-OPIUM 16.2-60 MG RE SUPP
1.0000 | Freq: Four times a day (QID) | RECTAL | Status: DC | PRN
  Administered 2018-11-04: 1 via RECTAL

## 2018-11-04 MED ORDER — CEFAZOLIN SODIUM-DEXTROSE 2-4 GM/100ML-% IV SOLN
2.0000 g | INTRAVENOUS | Status: AC
  Administered 2018-11-04: 2 g via INTRAVENOUS
  Filled 2018-11-04: qty 100

## 2018-11-04 MED ORDER — DOCUSATE SODIUM 100 MG PO CAPS
100.0000 mg | ORAL_CAPSULE | Freq: Two times a day (BID) | ORAL | Status: DC
  Administered 2018-11-04 – 2018-11-05 (×2): 100 mg via ORAL
  Filled 2018-11-04 (×2): qty 1

## 2018-11-04 MED ORDER — ACETAMINOPHEN 500 MG PO TABS
1000.0000 mg | ORAL_TABLET | Freq: Four times a day (QID) | ORAL | Status: AC
  Administered 2018-11-04 – 2018-11-05 (×4): 1000 mg via ORAL
  Filled 2018-11-04 (×4): qty 2

## 2018-11-04 MED ORDER — SULFAMETHOXAZOLE-TRIMETHOPRIM 800-160 MG PO TABS: 1.0000 | ORAL_TABLET | Freq: Two times a day (BID) | ORAL | 0 refills | Status: DC

## 2018-11-04 MED ORDER — ALBUMIN HUMAN 5 % IV SOLN
INTRAVENOUS | Status: AC
  Filled 2018-11-04: qty 250

## 2018-11-04 MED ORDER — ONDANSETRON HCL 4 MG/2ML IJ SOLN
INTRAMUSCULAR | Status: AC
  Filled 2018-11-04: qty 2

## 2018-11-04 MED ORDER — ONDANSETRON HCL 4 MG/2ML IJ SOLN: 4.0000 mg | INTRAMUSCULAR | Status: DC | PRN

## 2018-11-04 MED ORDER — LACTATED RINGERS IR SOLN
Status: DC | PRN
  Administered 2018-11-04: 1

## 2018-11-04 MED ORDER — DEXTROSE-NACL 5-0.45 % IV SOLN
INTRAVENOUS | Status: DC
  Administered 2018-11-04: 16:00:00 via INTRAVENOUS

## 2018-11-04 MED ORDER — LIDOCAINE HCL (CARDIAC) PF 100 MG/5ML IV SOSY
PREFILLED_SYRINGE | INTRAVENOUS | Status: DC | PRN
  Administered 2018-11-04: 100 mg via INTRAVENOUS

## 2018-11-04 MED ORDER — STERILE WATER FOR IRRIGATION IR SOLN
Status: DC | PRN
  Administered 2018-11-04: 1000 mL

## 2018-11-04 MED ORDER — SODIUM CHLORIDE (PF) 0.9 % IJ SOLN
INTRAMUSCULAR | Status: AC
  Filled 2018-11-04: qty 20

## 2018-11-04 MED ORDER — HYDROMORPHONE HCL 1 MG/ML IJ SOLN
0.2500 mg | INTRAMUSCULAR | Status: DC | PRN
  Administered 2018-11-04 (×2): 0.5 mg via INTRAVENOUS

## 2018-11-04 MED ORDER — EPHEDRINE 5 MG/ML INJ
INTRAVENOUS | Status: AC
  Filled 2018-11-04: qty 10

## 2018-11-04 MED ORDER — PROPOFOL 10 MG/ML IV BOLUS
INTRAVENOUS | Status: DC | PRN
  Administered 2018-11-04: 150 mg via INTRAVENOUS

## 2018-11-04 MED ORDER — KETAMINE HCL 10 MG/ML IJ SOLN
INTRAMUSCULAR | Status: DC | PRN
  Administered 2018-11-04 (×2): 10 mg via INTRAVENOUS
  Administered 2018-11-04: 40 mg via INTRAVENOUS

## 2018-11-04 MED ORDER — LIDOCAINE 2% (20 MG/ML) 5 ML SYRINGE
INTRAMUSCULAR | Status: AC
  Filled 2018-11-04: qty 5

## 2018-11-04 MED ORDER — FENTANYL CITRATE (PF) 250 MCG/5ML IJ SOLN
INTRAMUSCULAR | Status: AC
  Filled 2018-11-04: qty 5

## 2018-11-04 MED ORDER — HYDROMORPHONE HCL 1 MG/ML IJ SOLN
INTRAMUSCULAR | Status: AC
  Filled 2018-11-04: qty 2

## 2018-11-04 MED ORDER — ROCURONIUM BROMIDE 100 MG/10ML IV SOLN
INTRAVENOUS | Status: DC | PRN
  Administered 2018-11-04: 50 mg via INTRAVENOUS
  Administered 2018-11-04: 10 mg via INTRAVENOUS
  Administered 2018-11-04 (×4): 20 mg via INTRAVENOUS

## 2018-11-04 MED ORDER — HYDROMORPHONE HCL 1 MG/ML IJ SOLN
0.5000 mg | INTRAMUSCULAR | Status: DC | PRN
  Administered 2018-11-04: 19:00:00 1 mg via INTRAVENOUS
  Administered 2018-11-04: 0.5 mg via INTRAVENOUS
  Administered 2018-11-05 (×2): 1 mg via INTRAVENOUS
  Filled 2018-11-04 (×3): qty 1

## 2018-11-04 SURGICAL SUPPLY — 66 items
ADH SKN CLS APL DERMABOND .7 (GAUZE/BANDAGES/DRESSINGS)
AGENT HMST KT MTR STRL THRMB (HEMOSTASIS) ×2
APL ESCP 34 STRL LF DISP (HEMOSTASIS) ×2
APL PRP STRL LF DISP 70% ISPRP (MISCELLANEOUS) ×2
APL SWBSTK 6 STRL LF DISP (MISCELLANEOUS) ×2
APPLICATOR COTTON TIP 6 STRL (MISCELLANEOUS) ×2 IMPLANT
APPLICATOR COTTON TIP 6IN STRL (MISCELLANEOUS) ×4
APPLICATOR SURGIFLO ENDO (HEMOSTASIS) ×2 IMPLANT
CATH FOLEY 2WAY SLVR  5CC 18FR (CATHETERS) ×2
CATH FOLEY 2WAY SLVR 5CC 18FR (CATHETERS) ×2 IMPLANT
CATH TIEMANN FOLEY 18FR 5CC (CATHETERS) ×4 IMPLANT
CHLORAPREP W/TINT 26 (MISCELLANEOUS) ×4 IMPLANT
CLIP VESOLOCK LG 6/CT PURPLE (CLIP) ×8 IMPLANT
COVER SURGICAL LIGHT HANDLE (MISCELLANEOUS) ×4 IMPLANT
COVER TIP SHEARS 8 DVNC (MISCELLANEOUS) ×2 IMPLANT
COVER TIP SHEARS 8MM DA VINCI (MISCELLANEOUS) ×2
COVER WAND RF STERILE (DRAPES) IMPLANT
CUTTER ECHEON FLEX ENDO 45 340 (ENDOMECHANICALS) ×4 IMPLANT
DECANTER SPIKE VIAL GLASS SM (MISCELLANEOUS) ×4 IMPLANT
DERMABOND ADVANCED (GAUZE/BANDAGES/DRESSINGS)
DERMABOND ADVANCED .7 DNX12 (GAUZE/BANDAGES/DRESSINGS) IMPLANT
DRAIN CHANNEL RND F F (WOUND CARE) IMPLANT
DRAPE ARM DVNC X/XI (DISPOSABLE) ×8 IMPLANT
DRAPE COLUMN DVNC XI (DISPOSABLE) ×2 IMPLANT
DRAPE DA VINCI XI ARM (DISPOSABLE) ×8
DRAPE DA VINCI XI COLUMN (DISPOSABLE) ×2
DRAPE SURG IRRIG POUCH 19X23 (DRAPES) ×4 IMPLANT
DRSG TEGADERM 4X4.75 (GAUZE/BANDAGES/DRESSINGS) ×4 IMPLANT
ELECT PENCIL ROCKER SW 15FT (MISCELLANEOUS) ×4 IMPLANT
ELECT REM PT RETURN 15FT ADLT (MISCELLANEOUS) ×4 IMPLANT
GLOVE BIO SURGEON STRL SZ 6.5 (GLOVE) ×3 IMPLANT
GLOVE BIO SURGEON STRL SZ7.5 (GLOVE) ×8 IMPLANT
GLOVE BIO SURGEONS STRL SZ 6.5 (GLOVE) ×1
GOWN STRL REUS W/TWL LRG LVL3 (GOWN DISPOSABLE) ×4 IMPLANT
GOWN STRL REUS W/TWL XL LVL3 (GOWN DISPOSABLE) ×8 IMPLANT
HEMOSTAT SURGICEL 4X8 (HEMOSTASIS) ×2 IMPLANT
HOLDER FOLEY CATH W/STRAP (MISCELLANEOUS) ×4 IMPLANT
IRRIG SUCT STRYKERFLOW 2 WTIP (MISCELLANEOUS) ×4
IRRIGATION SUCT STRKRFLW 2 WTP (MISCELLANEOUS) ×2 IMPLANT
IV LACTATED RINGERS 1000ML (IV SOLUTION) ×4 IMPLANT
KIT TURNOVER KIT A (KITS) IMPLANT
NDL INSUFFLATION 14GA 120MM (NEEDLE) ×2 IMPLANT
NEEDLE INSUFFLATION 14GA 120MM (NEEDLE) ×4 IMPLANT
PACK ROBOT UROLOGY CUSTOM (CUSTOM PROCEDURE TRAY) ×4 IMPLANT
PAD POSITIONING PINK XL (MISCELLANEOUS) ×4 IMPLANT
RELOAD STAPLE 45 4.1 GRN THCK (STAPLE) ×2 IMPLANT
SCISSORS LAP 5X45 EPIX DISP (ENDOMECHANICALS) ×2 IMPLANT
SEAL CANN UNIV 5-8 DVNC XI (MISCELLANEOUS) ×8 IMPLANT
SEAL XI 5MM-8MM UNIVERSAL (MISCELLANEOUS) ×8
SET TUBE SMOKE EVAC HIGH FLOW (TUBING) ×4 IMPLANT
SOLUTION ELECTROLUBE (MISCELLANEOUS) ×4 IMPLANT
SPONGE LAP 4X18 RFD (DISPOSABLE) ×4 IMPLANT
STAPLE RELOAD 45 GRN (STAPLE) ×2 IMPLANT
STAPLE RELOAD 45MM GREEN (STAPLE) ×4
SURGIFLO W/THROMBIN 8M KIT (HEMOSTASIS) ×2 IMPLANT
SUT ETHILON 3 0 PS 1 (SUTURE) ×4 IMPLANT
SUT MNCRL AB 4-0 PS2 18 (SUTURE) ×8 IMPLANT
SUT VIC AB 0 UR5 27 (SUTURE) ×4 IMPLANT
SUT VIC AB 2-0 SH 27 (SUTURE) ×4
SUT VIC AB 2-0 SH 27X BRD (SUTURE) ×2 IMPLANT
SUT VICRYL 0 UR6 27IN ABS (SUTURE) ×8 IMPLANT
SUT VLOC BARB 180 ABS3/0GR12 (SUTURE) ×12
SUTURE VLOC BRB 180 ABS3/0GR12 (SUTURE) ×4 IMPLANT
TOWEL OR NON WOVEN STRL DISP B (DISPOSABLE) ×4 IMPLANT
TROCAR XCEL NON-BLD 5MMX100MML (ENDOMECHANICALS) IMPLANT
WATER STERILE IRR 1000ML POUR (IV SOLUTION) ×4 IMPLANT

## 2018-11-04 NOTE — Anesthesia Procedure Notes (Signed)
Procedure Name: Intubation Date/Time: 11/04/2018 7:46 AM Performed by: Glory Buff, CRNA Pre-anesthesia Checklist: Patient identified, Emergency Drugs available, Suction available and Patient being monitored Patient Re-evaluated:Patient Re-evaluated prior to induction Oxygen Delivery Method: Circle system utilized Preoxygenation: Pre-oxygenation with 100% oxygen Induction Type: IV induction Ventilation: Mask ventilation without difficulty Laryngoscope Size: Miller and 3 Grade View: Grade I Tube type: Oral Tube size: 7.5 mm Number of attempts: 1 Airway Equipment and Method: Stylet and Oral airway Placement Confirmation: ETT inserted through vocal cords under direct vision,  positive ETCO2 and breath sounds checked- equal and bilateral Secured at: 23 cm Tube secured with: Tape Dental Injury: Teeth and Oropharynx as per pre-operative assessment

## 2018-11-04 NOTE — H&P (Signed)
CC/HPI: Pt presents today for pre-operative history and physical exam in anticipation of robotic assisted lap prostatectomy with bilateral pelvic lymph node dissection on 11/04/18 by Raymond [name]. He is doing well and is without complaint. He is continuing to try to quit smoking.   Pt denies F/C, HA, CP, SOB, N/V, diarrhea/constipation, back pain, flank pain, hematuria, and dysuria.     HX:   CC: Prostate cancer  HPI:  08/29/2018  Patient presents today as a referral from Raymond [name]. Patient has a history of multiple negative prostate biopsies. He also had a negative MRI in 2018. He however had persistently elevated PSA and PSA elevation progression. For this reason, he underwent a repeat prostate biopsy on 08/07/2018.   PSA 42.6  Prostate size 38 g  Final pathology Gleason 4 + 3 adenocarcinoma of the prostate in 5/6 cores on the right and 0/6 cores on the left.  CT and bone scan negative for metastatic disease  He has had an open appendectomy in the past.  He has erectile dysfunction that responds well to sildenafil.  Denies urinary incontinence.     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, Prostatic intraepithelial neoplasia with adjacent atypia - 2016      PMH Notes:  2012-07-30 10:42:00 - Note: Arthritis   NON-GU PMH: Encounter for general adult medical examination without abnormal findings, Encounter for preventive health examination -  2017 Personal history of other diseases of the circulatory system, History of hypertension - 2014 Personal history of other mental and behavioral disorders, History of depression - 2014 Hypercholesterolemia    FAMILY HISTORY: Death In The Family Raymond [name] - Raymond [name] Death In The Family Raymond [name] - Raymond [name] Family Health Status Number - Runs In Family No Significant Family History - Runs In Family   SOCIAL HISTORY: Marital Status: Single Preferred Language: English; Ethnicity: Not Hispanic Or Latino; Race: Black or African American Current Smoking Status: Patient smokes occasionally. Has smoked since 01/10/1996. Smokes less than 1/2 pack per day.   Tobacco Use Assessment Completed: Used Tobacco in last 30 days? Does not use smokeless tobacco. Types of alcohol consumed: Beer. Light Drinker.  Patient uses recreational drugs. Uses marijuana. Drinks 1 caffeinated drink per day. Has not had a blood transfusion.     Notes: Smokes 2 cigarettes over 3 days and working to quit  Marijuana weekly  Beer every other day   REVIEW OF SYSTEMS:    GU Review Male:   Patient denies frequent urination, hard to postpone urination, burning/ pain with urination, get up at night to urinate, leakage of urine, stream starts and stops, trouble starting your stream, have to strain to urinate , erection problems, and penile pain.  Gastrointestinal (Upper):   Patient denies nausea, vomiting, and indigestion/ heartburn.  Gastrointestinal (Lower):   Patient denies diarrhea and constipation.  Constitutional:   Patient denies fever, night sweats, weight loss, and fatigue.  Skin:   Patient denies skin rash/ lesion and itching.  Eyes:   Patient denies blurred vision and double vision.  Ears/ Nose/ Throat:   Patient denies sore throat and sinus problems.  Hematologic/Lymphatic:   Patient denies swollen glands and easy bruising.  Cardiovascular:   Patient denies leg swelling and chest pains.  Respiratory:   Patient denies cough  and shortness of breath.  Endocrine:   Patient denies excessive thirst.  Musculoskeletal:   Patient reports joint pain. Patient denies back pain.  Neurological:   Patient denies headaches and dizziness.  Psychologic:   Patient denies depression and anxiety.   Notes: groin pain    VITAL SIGNS:      10/22/2018 01:05 PM  Weight 180 lb / 81.65 kg  Height 70 in / 177.8 cm  BP 138/79 mmHg  Heart Rate 85 /min  Temperature 97.5 F / 36.3 C  BMI 25.8 kg/m   MULTI-SYSTEM PHYSICAL EXAMINATION:    Constitutional: Well-nourished. No physical deformities. Normally developed. Good grooming.  Neck: Neck symmetrical, not swollen. Normal tracheal position.  Respiratory: Normal breath sounds. No labored breathing, no use of accessory muscles.   Cardiovascular: Regular rate and rhythm. No murmur, no gallop. Normal temperature.   Lymphatic: No enlargement of neck, axillae, groin.  Skin: No paleness, no jaundice, no cyanosis. No lesion, no ulcer, no rash.  Neurologic / Psychiatric: Oriented to time, oriented to place, oriented to person. No depression, no anxiety, no agitation.  Gastrointestinal: No mass, no tenderness, no rigidity, non obese abdomen.  Eyes: Normal conjunctivae. Normal eyelids.  Ears, Nose, Mouth, and Throat: Left ear no scars, no lesions, no masses. Right ear no scars, no lesions, no masses. Nose no scars, no lesions, no masses. Normal hearing. Normal lips.  Musculoskeletal: Normal gait and station of head and neck.     PAST DATA REVIEWED:  Source Of History:  Patient  Records Review:   Previous Patient Records  Urine Test Review:   Urinalysis   08/21/16  Hormones  Testosterone, Total 319.0 ng/dL    10/22/18  Urinalysis  Urine Appearance Clear   Urine Color Yellow   Urine Glucose Neg mg/dL  Urine Bilirubin Neg mg/dL  Urine Ketones Neg mg/dL  Urine Specific Gravity 1.020   Urine Blood Neg ery/uL  Urine pH 6.0   Urine Protein Neg mg/dL  Urine Urobilinogen 0.2 mg/dL   Urine Nitrites Neg   Urine Leukocyte Esterase Neg leu/uL   PROCEDURES:          Urinalysis - 81003 Dipstick Dipstick Cont'd  Color: Yellow Bilirubin: Neg mg/dL  Appearance: Clear Ketones: Neg mg/dL  Specific Gravity: 1.020 Blood: Neg ery/uL  pH: 6.0 Protein: Neg mg/dL  Glucose: Neg mg/dL Urobilinogen: 0.2 mg/dL    Nitrites: Neg    Leukocyte Esterase: Neg leu/uL    ASSESSMENT:      ICD-10 Details  1 GU:   Prostate Cancer - C61    PLAN:           Schedule Return Visit/Planned Activity: Keep Scheduled Appointment - Schedule Surgery          Document Letter(s):  Created for Patient: Clinical Summary         Notes:   There are no changes in the patients history or physical exam since last evaluation by Raymond [name]. Pt is scheduled to undergo RALP with BPLND on 11/04/18.    All pt's questions were answered to the best of my ability.  Next Appointment:      Next Appointment: 11/04/2018 07:30 AM    Appointment Type: Surgery     Location: [facility] [phone]    Provider: Mcarthur Rossetti, PA    Reason for Visit: [facility] AST DR Ashaki Frosch RALP WIHT BIL PLND     Signed by Mcarthur Rossetti, PA on 10/22/18 at 1:29 PM (EDT

## 2018-11-04 NOTE — Anesthesia Postprocedure Evaluation (Signed)
Anesthesia Post Note  Patient: Raymond [name]  Procedure(s) Performed: XI ROBOTIC ASSISTED LAPAROSCOPIC RADICAL PROSTATECTOMY (N/A ) PELVIC LYMPH NODE DISSECTION (Bilateral )     Patient location during evaluation: PACU Anesthesia Type: General Level of consciousness: sedated and patient cooperative Pain management: pain level controlled Vital Signs Assessment: post-procedure vital signs reviewed and stable Respiratory status: spontaneous breathing Cardiovascular status: stable Anesthetic complications: no    Last Vitals:  Vitals:   11/04/18 1200 11/04/18 1300  BP: (!) 147/87 (!) 144/92  Pulse: 94 91  Resp: 12 14  Temp: 36.7 C 36.9 C  SpO2: 94% 99%    Last Pain:  Vitals:   11/04/18 1323  TempSrc:   PainSc: Aneta

## 2018-11-04 NOTE — Op Note (Signed)
Operative Note  Preoperative diagnosis:  1.  Prostate cancer   Postoperative diagnosis: 1.  Prostate cancer  Procedure(s): 1.  Robotic assisted laparoscopic prostatectomy with bilateral pelvic lymph node dissection Surgeon: Link Snuffer, MD  Assistants: Estill Bamberg dancy--an assistant was needed due to the nature of the case being a robotic surgery requiring a bedside assistant for retraction, suctioning, exchanging instruments, etc.   Anesthesia: General   Complications: None   EBL: 150 cc   Specimens: 1. prostate and seminal vesicles 2.  Right pelvic lymph node packet 3.  Left pelvic lymph node packet  Drains/Catheters: 1. 31 French Foley catheter and JP drain   Intraoperative findings: prostate and seminal vesicles were removed en bloc Without any evidence of gross extraprostatic disease  Indication: 58 year old male recently diagnosed with prostate cancer presents for the previously mentioned operation.  Description of procedure:  The patient was identified and consent was obtained.  The patient was taken to the operating room and placed in the supine position.  The patient was placed under general anesthesia.  Perioperative antibiotics were administered.  The patient was placed in dorsal lithotomy.  Patient was prepped and draped in a standard sterile fashion and a timeout was performed.  The patient was placed in steep Trendelenburg.  Foley catheter was placed.  Veress needle was inserted supraumbilical and the drop test was performed with no evidence of any injury.  The abdomen was then insufflated to a pressure of 15.  4 robotic working ports, a 12 mm assistant port, and a 5 mm assistant port were placed under direct visualization in a standard fashion for a robotic pelvic surgery.  The abdomen was inspected and there was no evidence of any visceral or vascular injury.  I first released colonic adhesions in the left lower quadrant.  I then retracted the sigmoid and rectum  superiorly.  I first started with the posterior dissection by incising along the posterior peritoneum and identifying bilateral vas deferens.  These were divided and bilateral seminal vesicles were carefully dissected out.  Denonvier fascia was then entered posteriorly.  The bladder was then dropped by incising along the medial umbilical ligament bilaterally.  Periprosthetic fat was cleared off the prostate and passed off and passed off for specimen.  The endopelvic fascia was incised bilaterally and the puboprostatic ligaments were released.  A stapler with a vascular staple load was used to staple the dorsal venous complex.  I then incised along into the bladder neck and divided the bladder neck.  The catheter was brought out and used for retraction.  I divided the remainder of the bladder neck and then pulled the seminal vesicles out of this incision.  Careful dissection was performed and bilateral prostatic pedicles were released using Hem-o-lok clips and sharp dissection.  Spot cautery was sparingly used.  Periprosthetic tissue was carefully released inferiorly and laterally, performing a nerve sparing operation on the left and nonnerve sparing on the right.  Once only the urethra remained attached, the anterior portion of the urethra was divided.  A 2-0 Vicryl stitch was placed at the 6 o'clock position.  The remainder of the urethra was divided and the prostate was placed in a specimen bag.  FloSeal was applied to the prostatectomy bed.  I then performed the bilateral pelvic lymph node dissection.  I first carefully removed lymph node packet from the right side overlying the external iliac artery and vein down to the level of the obturator nerve.  This was passed off for specimen and  then I carefully removed the left sided pelvic lymph node packets again overlying the external iliac artery and vein down to the level of the obturator nerve taking care not to injure any vital structures.  I noted that  there was a wide bladder neck.  Therefore I reconstructed the bladder neck by reapproximating the mucosa with a running 3 oh V-Lock suture until there was an appropriate sized bladder neck opening.  The 2-0 Vicryl stitch was used to reapproximate the bladder and urethra at the 6 o'clock position.  A running 3-0 V lock suture was then used to reapproximate the bladder and urethra.  The bladder was filled with normal saline and there was no evidence of any leak at the anastomosis.  The anastomosis was watertight.  The Foley catheter was exchanged for a fresh catheter.  Surgicel was applied to the prostatectomy bed.  A JP drain was inserted from the left lateral port site.  This was secured down with a nylon stitch.  The robot was undocked and the patient was taken out of Trendelenburg.  All working ports were removed under direct visualization with the camera to ensure there was no bleeding from the sites.  The midline port incision was extended and the prostate extracted in the specimen bag.  Interrupted figure-of-eight 0 Vicryl sutures were used to close  the fascia.  The 12 mm assistant port fascia was also closed with a 2-0 Vicryl.  Skin was then closed with 4-0 Monocryl and Dermabond.  Exparel was used for anesthetic effect.  This concluded the operation.  The patient tolerated procedure well and was stable postoperatively.  Plan: Will obtain stat labs.  He will remain in the hospital overnight and hopefully be able to be discharged tomorrow.  He will keep his catheter for 7-10 days.

## 2018-11-04 NOTE — Discharge Instructions (Addendum)

## 2018-11-04 NOTE — Transfer of Care (Signed)
Immediate Anesthesia Transfer of Care Note  Patient: Raymond [name]  Procedure(s) Performed: XI ROBOTIC ASSISTED LAPAROSCOPIC RADICAL PROSTATECTOMY (N/A ) PELVIC LYMPH NODE DISSECTION (Bilateral )  Patient Location: PACU  Anesthesia Type:General  Level of Consciousness: drowsy, patient cooperative and responds to stimulation  Airway & Oxygen Therapy: Patient Spontanous Breathing and Patient connected to face mask oxygen  Post-op Assessment: Report given to RN and Post -op Vital signs reviewed and stable  Post vital signs: Reviewed and stable  Last Vitals:  Vitals Value Taken Time  BP 134/80 11/04/18 1125  Temp    Pulse 99 11/04/18 1127  Resp 18 11/04/18 1127  SpO2 100 % 11/04/18 1127  Vitals shown include unvalidated device data.  Last Pain:  Vitals:   11/04/18 0611  TempSrc: Oral  PainSc:       Patients Stated Pain Goal: 3 ([id number])  Complications: No apparent anesthesia complications

## 2018-11-04 NOTE — Progress Notes (Signed)
Patient ID: Raymond [name], male   DOB: July 12, 1960, 58 y.o.   MRN: [id number] Post-op note  Subjective: The patient is doing well.  No complaints.  Denies N/V  Objective: Vital signs in last 24 hours: Temp:  [97.7 F (36.5 C)-98.9 F (37.2 C)] 98.9 F (37.2 C) (10/26 1500) Pulse Rate:  [69-99] 97 (10/26 1500) Resp:  [11-18] 17 (10/26 1500) BP: (134-153)/(80-97) 138/97 (10/26 1500) SpO2:  [94 %-100 %] 99 % (10/26 1500) Weight:  [83.6 kg] 83.6 kg (10/26 0553)  Intake/Output from previous day: No intake/output data recorded. Intake/Output this shift: Total I/O In: 2845 [P.O.:480; I.V.:2015; IV Piggyback:350] Out: 1205 [Urine:800; Drains:105; Blood:300]  Physical Exam:  General: Alert and oriented. Abdomen: Soft, Nondistended. Incisions: Clean and dry. Urine: red  Lab Results: Recent Labs    11/04/18 1134  HGB 12.4*  HCT 40.5    Assessment/Plan: POD#0   1) Continue to monitor  2) DVT prophy, clears, IS, amb, pain control   LOS: 0 days   Debbrah Alar 11/04/2018, 3:25 PM

## 2018-11-05 ENCOUNTER — Encounter (HOSPITAL_COMMUNITY): Payer: Self-pay | Admitting: Urology

## 2018-11-05 DIAGNOSIS — C61 Malignant neoplasm of prostate: Secondary | ICD-10-CM | POA: Diagnosis not present

## 2018-11-05 LAB — BASIC METABOLIC PANEL
Anion gap: 9 (ref 5–15)
BUN: 8 mg/dL (ref 6–20)
CO2: 25 mmol/L (ref 22–32)
Calcium: 8.7 mg/dL — ABNORMAL LOW (ref 8.9–10.3)
Chloride: 104 mmol/L (ref 98–111)
Creatinine, Ser: 0.93 mg/dL (ref 0.61–1.24)
GFR calc Af Amer: >60 mL/min (ref >60)
GFR calc non Af Amer: >60 mL/min (ref >60)
Glucose, Bld: 102 mg/dL — ABNORMAL HIGH (ref 70–99)
Potassium: 3.6 mmol/L (ref 3.5–5.1)
Sodium: 138 mmol/L (ref 135–145)

## 2018-11-05 LAB — HEMOGLOBIN AND HEMATOCRIT, BLOOD
HCT: 35.8 % — ABNORMAL LOW (ref 39.0–52.0)
HCT: 36.4 % — ABNORMAL LOW (ref 39.0–52.0)
Hemoglobin: 11.2 g/dL — ABNORMAL LOW (ref 13.0–17.0)
Hemoglobin: 11.3 g/dL — ABNORMAL LOW (ref 13.0–17.0)

## 2018-11-05 MED ORDER — CHLORHEXIDINE GLUCONATE CLOTH 2 % EX PADS
6.0000 | MEDICATED_PAD | Freq: Every day | CUTANEOUS | Status: DC
  Administered 2018-11-05: 11:00:00 6 via TOPICAL

## 2018-11-05 MED ORDER — BISACODYL 10 MG RE SUPP
10.0000 mg | Freq: Once | RECTAL | Status: AC
  Administered 2018-11-05: 10 mg via RECTAL
  Filled 2018-11-05: qty 1

## 2018-11-05 NOTE — Progress Notes (Signed)
  1 Day Post-Op Subjective: The patient is doing well.  No nausea or vomiting. Pain is adequately controlled.  He has not ambulated this morning nor passed flatus.  H/H dropped slightly again this morning.  Objective: Vital signs in last 24 hours: Temp:  [97.7 F (36.5 C)-98.9 F (37.2 C)] 98.6 F (37 C) (10/27 0446) Pulse Rate:  [70-99] 70 (10/27 0446) Resp:  [11-19] 19 (10/27 0446) BP: (129-153)/(74-97) 129/74 (10/27 0446) SpO2:  [94 %-100 %] 97 % (10/27 0446)  Intake/Output from previous day: 10/26 0701 - 10/27 0700 In: 4761.2 [P.O.:1440; I.V.:2971.2; IV Piggyback:350] Out: [id number] [Urine:4225; Drains:230; Blood:300] Intake/Output this shift: Total I/O In: -  Out: 665 [Urine:650; Drains:15]  Physical Exam:  General: Alert and oriented. CV: RRR Lungs: Clear bilaterally. GI: Soft, Nondistended. Incisions: Clean, dry, and intact Urine: Clear Extremities: Nontender, no erythema, no edema.  Lab Results: Recent Labs    11/04/18 1134 11/05/18 0502 11/05/18 1055  HGB 12.4* 11.2* 11.3*  HCT 40.5 35.8* 36.4*      Assessment/Plan: POD# 1 s/p robotic prostatectomy.  1) SL IVF 2) Ambulate, Incentive spirometry 3) Transition to oral pain medication 4) Dulcolax suppository 5) D/C pelvic drain 6) Repeat H/H remained stable 7) Plan for likely discharge later today   Raymond [name]. MD   LOS: 0 days   Debbrah Alar 11/05/2018, 11:16 AM

## 2018-11-05 NOTE — Discharge Summary (Signed)
  Date of admission: 11/04/2018  Date of discharge: 11/05/2018  Admission diagnosis: Prostate Cancer  Discharge diagnosis: Prostate Cancer  History and Physical: For full details, please see admission history and physical. Briefly, Raymond [name] is a 58 y.o. gentleman with localized prostate cancer.  After discussing management/treatment options, he elected to proceed with surgical treatment.  Hospital Course: JONHATAN HEARTY was taken to the operating room on 11/04/2018 and underwent a robotic assisted laparoscopic radical prostatectomy. He tolerated this procedure well and without complications. Postoperatively, he was able to be transferred to a regular hospital room following recovery from anesthesia.  He was able to begin ambulating the night of surgery. He remained hemodynamically stable overnight.  He had excellent urine output with appropriately minimal output from his pelvic drain and his pelvic drain was removed on POD #1.  He was transitioned to oral pain medication, tolerated a clear liquid diet, and had met all discharge criteria and was able to be discharged home later on POD#1.  Laboratory values:  Recent Labs    11/04/18 1134 11/05/18 0502 11/05/18 1055  HGB 12.4* 11.2* 11.3*  HCT 40.5 35.8* 36.4*    Disposition: Home  Discharge instruction: He was instructed to be ambulatory but to refrain from heavy lifting, strenuous activity, or driving. He was instructed on urethral catheter care.  Discharge medications:   Allergies as of 11/05/2018   No Known Allergies     Medication List    STOP taking these medications   aspirin EC 81 MG tablet     TAKE these medications   gemfibrozil 600 MG tablet Commonly known as: LOPID Take 600 mg by mouth 2 (two) times daily.   lisinopril-hydrochlorothiazide 20-25 MG tablet Commonly known as: ZESTORETIC Take 1 tablet by mouth every morning.   sildenafil 20 MG tablet Commonly known as: REVATIO Take 20 mg by mouth daily as  needed (ED).   sulfamethoxazole-trimethoprim 800-160 MG tablet Commonly known as: BACTRIM DS Take 1 tablet by mouth 2 (two) times daily. Start the day prior to foley removal appointment   traMADol 50 MG tablet Commonly known as: Ultram Take 1-2 tablets (50-100 mg total) by mouth every 6 (six) hours as needed for moderate pain or severe pain. What changed:   how much to take  reasons to take this       Followup: He will followup in 1 week for catheter removal and to discuss his surgical pathology results.

## 2018-11-05 NOTE — Discharge Summary (Signed)
Physician Discharge Summary  Patient ID: Raymond [name] MRN: [id number] DOB/AGE: 1960/08/15 58 y.o.  Admit date: 11/04/2018 Discharge date: 11/05/2018  Admission Diagnoses:  Discharge Diagnoses:  Active Problems:   Prostate cancer [facility])   Discharged Condition: good  Hospital Course: Patient underwent a robotic assisted laparoscopic prostatectomy with bilateral pelvic lymph node dissection on 11/04/2018.  He tolerated the procedure well and was stable postoperatively.  He did well the following day and was stable for discharge.  Consults: None  Significant Diagnostic Studies: None  Treatments: surgery: As above  Discharge Exam: Blood pressure 129/74, pulse 70, temperature 98.6 F (37 C), temperature source Oral, resp. rate 19, height 5\' 11"  (1.803 m), weight 83.6 kg, SpO2 97 %. General appearance: alert no acute distress CV: RRR Lungs: Clear bilaterally. GI: Soft, Nondistended. Incisions: Clean, dry, and intact Urine: Clear Extremities: Nontender, no erythema, no edema.  Disposition:    Allergies as of 11/05/2018   No Known Allergies     Medication List    STOP taking these medications   aspirin EC 81 MG tablet     TAKE these medications   gemfibrozil 600 MG tablet Commonly known as: LOPID Take 600 mg by mouth 2 (two) times daily.   lisinopril-hydrochlorothiazide 20-25 MG tablet Commonly known as: ZESTORETIC Take 1 tablet by mouth every morning.   sildenafil 20 MG tablet Commonly known as: REVATIO Take 20 mg by mouth daily as needed (ED).   sulfamethoxazole-trimethoprim 800-160 MG tablet Commonly known as: BACTRIM DS Take 1 tablet by mouth 2 (two) times daily. Start the day prior to foley removal appointment   traMADol 50 MG tablet Commonly known as: Ultram Take 1-2 tablets (50-100 mg total) by mouth every 6 (six) hours as needed for moderate pain or severe pain. What changed:   how much to take  reasons to take this      Follow-up Information     [name], MD On 11/13/2018.   Specialty: Urology Why: at 1:45 Contact information: [place] [address] [phone]           Signed: Marton Redwood, III 11/05/2018, 12:37 PM

## 2018-11-05 NOTE — Progress Notes (Signed)
Patient discharging home.  IV removed - WNL.  Reviewed AVS, meds, and follow up appointment.  Patient educated on foley care, bag care and management at home - stressed importance of handwashing before and after changing catheters.  Educated on site care where JP was removed - gauze and tape supplied as well as extra large bag and 2 legs bags. Encouraged frequent ambulation and hydration.  Patient verbalizes understanding.  Patient able to demonstrate exchange of bags successfully.  No questions at this time, patient in NAD waiting on arrival of family.

## 2018-11-05 NOTE — Plan of Care (Signed)
  Problem: Elimination: Goal: Will not experience complications related to bowel motility Outcome: Adequate for Discharge Note: Dulcolax supp given -  encourage ambulation and instructed to stay on clears until passing gas Goal: Will not experience complications related to urinary retention Outcome: Adequate for Discharge Note: Foley remains in place per protocol - patient educated on foley care at home   Problem: Bowel/Gastric: Goal: Gastrointestinal status for postoperative course will improve Outcome: Adequate for Discharge

## 2018-11-07 LAB — SURGICAL PATHOLOGY

## 2018-12-04 ENCOUNTER — Ambulatory Visit: Payer: Medicaid Other | Attending: Urology | Admitting: Physical Therapy

## 2018-12-04 ENCOUNTER — Encounter: Payer: Self-pay | Admitting: Physical Therapy

## 2018-12-04 ENCOUNTER — Other Ambulatory Visit: Payer: Self-pay

## 2018-12-04 DIAGNOSIS — R252 Cramp and spasm: Secondary | ICD-10-CM | POA: Insufficient documentation

## 2018-12-04 DIAGNOSIS — M6281 Muscle weakness (generalized): Secondary | ICD-10-CM | POA: Diagnosis present

## 2018-12-04 NOTE — Patient Instructions (Signed)
Access Code: [email or web address]  URL: [email or web address]  Date: 12/04/2018  Prepared by: Jari Favre   Exercises  Supine Butterfly Groin Stretch - 1 reps - 1 sets - 1 min hold - 1x daily - 7x weekly  Seated Hamstring Stretch - 2 reps - 1 sets - 30 sec hold - 1x daily - 7x weekly  Supine Hamstring Stretch - 2 reps - 1 sets - sec hold - 1x daily - 7x weekly  Supine Figure 4 Piriformis Stretch - 2 reps - 1 sets - 30 sec hold - 1x daily - 7x weekly  Seated Piriformis Stretch with Trunk Bend - 2 reps - 1 sets - 30 sec hold - 1x daily - 7x weekly  Supine Piriformis Stretch with Foot on Ground - 2 reps - 1 sets - 30 sec hold - 1x daily - 7x weekly  Supine Quadriceps Stretch with Strap on Table - 2 reps - 1 sets - 30 sec hold - 1x daily - 7x weekly

## 2018-12-04 NOTE — Therapy (Signed)
[facility] [address] [place], [place], [address] Phone: [phone]   Fax:  [phone]  Physical Therapy Evaluation  Patient Details  Name: Raymond [name] MRN: [id number] Date of Birth: 11-13-1960 Referring Provider (PT): [name], MD   Encounter Date: 12/04/2018  PT End of Session - 12/04/18 1117    Visit Number  1    Date for PT Re-Evaluation  03/26/19    PT Start Time  1104    PT Stop Time  1148    PT Time Calculation (min)  44 min    Activity Tolerance  Patient tolerated treatment well    Behavior During Therapy  [facility] for tasks assessed/performed       Past Medical History:  Diagnosis Date  . Arthritis   . At risk for sleep apnea    STOP-BANG= 4    SENT TO PCP 07-09-2014  . BPH (benign prostatic hypertrophy)   . Dental caries    periodontitis  . Depression   . Elevated PSA   . History of concussion    yrs ago-- no LOC-- no residual  . History of depression   . Hypertension   . Nocturia   . Prostate cancer ([place])   . Wears glasses     Past Surgical History:  Procedure Laterality Date  . APPENDECTOMY  age 65  . MULTIPLE EXTRACTIONS WITH ALVEOLOPLASTY N/A 10/12/2017   Procedure: MULTIPLE EXTRACTION WITH ALVEOLOPLASTY;  Surgeon: [name], DDS;  Location: [place];  Service: Oral Surgery;  Laterality: N/A;  . PELVIC LYMPH NODE DISSECTION Bilateral 11/04/2018   Procedure: PELVIC LYMPH NODE DISSECTION;  Surgeon: [name], MD;  [facility];  Service: Urology;  Laterality: Bilateral;  . PROSTATE BIOPSY N/A 07/15/2014   Procedure: SATURATION BIOPSY TRANSRECTAL ULTRASONIC PROSTATE (TUBP);  Surgeon: [name], MD;  Location[facility];  Service: Urology;  Laterality: N/A;  . PROSTATE BIOPSY N/A 08/07/2018   Procedure: BIOPSY TRANSRECTAL ULTRASONIC PROSTATE (TUBP);  Surgeon: [name], MD;  [facility];  Service: Urology;   Laterality: N/A;  ONLY NEEDS 30 MIN  . RIGHT KNEE ARTHROSCOPY /  MENISECTOMY/  DEBRIDEMENT OF CHONDROMALACIA PATELLA AND MEDIAL SHELF PLICA/  SUBCHONDROPLASTY  06-10-2010  . ROBOT ASSISTED LAPAROSCOPIC RADICAL PROSTATECTOMY N/A 11/04/2018   Procedure: XI ROBOTIC ASSISTED LAPAROSCOPIC RADICAL PROSTATECTOMY;  Surgeon: [name], MD;  [facility];  Service: Urology;  Laterality: N/A;  . TOTAL KNEE ARTHROPLASTY  05/31/2011   Procedure: TOTAL KNEE ARTHROPLASTY;  Surgeon: [name], MD;  Location: [place];  Service: Orthopedics;  Laterality: Right;  with revision stem    There were no vitals filed for this visit.   Subjective Assessment - 12/04/18 1107    Subjective  Pt states he is still having leakage.  He reports he has to use about 3-4 diapers/day.  He drinks about 6 glasses of a combo of cranberry juice and water/day.  Pt states he is leaking when he is up during the day but at night he is up 1-2x and makes it to the bathroom. I sometimes have pain around my tailbone like I was riding a bike for a while.    Patient Stated Goals  not have to wear diapers    Currently in Pain?  No/denies         [facility] PT Assessment - 12/04/18 0001      Assessment   Medical Diagnosis  C61 (ICD-10-CM) - Malignant neoplasm of prostate    Referring Provider (PT)  [name], MD    Onset Date/Surgical Date  11/04/18    Prior Therapy  No      Precautions   Precautions  None      Restrictions   Weight Bearing Restrictions  No      Balance Screen   Has the patient fallen in the past 6 months  No      [place] residence      Prior Function   Level of Independence  Independent      Cognition   Overall Cognitive Status  Within Functional Limits for tasks assessed      Posture/Postural Control   Posture/Postural Control  No significant limitations      ROM / Strength   AROM / PROM / Strength  PROM;Strength      PROM   Overall PROM  Comments  hip ER 50% limited bilateral      Strength   Overall Strength Comments  Rt hip abduction and adduction 4/5 MMT; Lt hip abd and add 4+/5 MMT - bilat hip ext 4+/5 MMT      Flexibility   Soft Tissue Assessment /Muscle Length  yes      Ambulation/Gait   Gait Pattern  Decreased stride length                Objective measurements completed on examination: See above findings.    Pelvic Floor Special Questions - 12/04/18 0001    Prior Pelvic/Prostate Exam  Yes    Urinary Leakage  Yes    How often  at least 4x/day    Pad use  4 diapers    Activities that cause leaking  Walking;With strong urge   standing   Urinary frequency  nocturia 2x/night    Fecal incontinence  No    Skin Integrity  Intact    External Palpation  Rt obdurator internus tight and TTP    Pelvic Floor Internal Exam  pt informed and identity confirmed    Exam Type  Deferred;Rectal    Strength  weak squeeze, no lift    Strength # of reps  3    Strength # of seconds  3    Tone  only able to assess sphincter muscles - weak and low duration - further internal assessment deferred until cleared from surgeon               PT Education - 12/04/18 1143    Education Details  Access Code: [email or web address]    Person(s) Educated  Patient    Methods  Explanation;Demonstration;Handout;Verbal cues;Tactile cues    Comprehension  Verbalized understanding;Returned demonstration       PT Short Term Goals - 12/04/18 1210      PT SHORT TERM GOAL #1   Title  ind with self massage to the pelvic floor for improved functional strength    Time  4    Period  Weeks    Status  New    Target Date  01/01/19      PT SHORT TERM GOAL #2   Title  Pt ind with initial HEP    Time  4    Period  Weeks    Status  New    Target Date  01/01/19        PT Long Term Goals - 12/04/18 1205      PT LONG TERM  GOAL #1   Title  Pt will be able to hold his bladder for at least 5 minutes after the initial urge in order to do  normal household chores without leakage    Time  16    Period  Weeks    Status  New    Target Date  03/26/19      PT LONG TERM GOAL #2   Title  Pt will report 80% less pain around the tailbone so he can sit with good upright posture    Time  16    Period  Weeks    Status  New    Target Date  03/26/19      PT LONG TERM GOAL #3   Title  Pt will feel confident to go to the sore without wearing diaper due to improved bladder control    Time  16    Period  Weeks    Status  New    Target Date  03/26/19      PT LONG TERM GOAL #4   Title  Pt will be ind with advanced HEP    Time  16    Period  Weeks    Status  New    Target Date  03/26/19             Plan - 12/04/18 1211    Clinical Impression Statement  Pt presents to skilled PT due to urinary leakage as main complaint.  He also has some discomfort around the tailbone.  Pt demosntrates hip weakness Rt>left. He has decreased hip ER ROM.  Pt has pelvic floor weakness and decreased endurnace.  Unable to palpate all muscles today due to being only 4 weaks post prostatectomy.  Pt has tight muscles palpated externally to pelvic floor and is TTP with external palpation to obdurator internus.  Pt will benefit from skilled PT to address impairments so he can retutrn to full functional activities without urinary leakage.    Personal Factors and Comorbidities  Comorbidity 1    Comorbidities  post prostatectomy    Examination-Activity Limitations  Toileting;Continence    Examination-Participation Restrictions  Community Activity    Stability/Clinical Decision Making  Stable/Uncomplicated    Clinical Decision Making  Low    Rehab Potential  Excellent    PT Frequency  2x / week   reducing to 1x/ week as able   PT Duration  Other (comment)   16 weeks d/t potential interruptions in tx due to insurance   PT Treatment/Interventions  ADLs/Self Care Home Management;Biofeedback;Cryotherapy;Electrical Stimulation;Moist Heat;Therapeutic  exercise;Therapeutic activities;Neuromuscular re-education;Patient/family education;Manual techniques;Passive range of motion;Dry needling;Taping    PT Next Visit Plan  biofeedback, review stretches and breathing, initial pelvic floor strengthening    PT Home Exercise Plan  Access Code: [email or web address]    Consulted and Agree with Plan of Care  Patient       Patient will benefit from skilled therapeutic intervention in order to improve the following deficits and impairments:  Decreased range of motion, Increased fascial restricitons, Decreased strength, Pain, Increased muscle spasms  Visit Diagnosis: Muscle weakness (generalized)  Cramp and spasm     Problem List Patient Active Problem List   Diagnosis Date Noted  . Prostate cancer ([place]) 11/04/2018  . Malignant neoplasm of prostate ([place]) 09/13/2018  . EPIDERMOID CYST 09/07/2009  . LYMPHADENOPATHY 09/07/2009  . ABSCESS, TOOTH 05/21/2009  . HYPERTRIGLYCERIDEMIA 04/19/2009  . DEPRESSION 03/24/2009  . COCAINE ABUSE 02/15/2009  . ALCOHOL USE 02/12/2009  . TOBACCO ABUSE 02/12/2009  .  HYPERTENSION 02/12/2009  . OSTEOARTHRITIS 02/12/2009  . BACK PAIN, LUMBAR 02/12/2009  . URINALYSIS, ABNORMAL 02/12/2009    Jule Ser, PT 12/04/2018, 12:42 PM  [facility] [address] [place], [place], [address] Phone: [phone]   Fax:  [phone]  Name: Raymond [name] MRN: [id number] Date of Birth: 1960-10-18

## 2018-12-16 ENCOUNTER — Other Ambulatory Visit: Payer: Self-pay

## 2018-12-16 ENCOUNTER — Ambulatory Visit: Payer: Medicaid Other | Attending: Urology | Admitting: Physical Therapy

## 2018-12-16 ENCOUNTER — Encounter: Payer: Self-pay | Admitting: Physical Therapy

## 2018-12-16 DIAGNOSIS — M6281 Muscle weakness (generalized): Secondary | ICD-10-CM | POA: Diagnosis not present

## 2018-12-16 DIAGNOSIS — R252 Cramp and spasm: Secondary | ICD-10-CM | POA: Insufficient documentation

## 2018-12-16 NOTE — Therapy (Signed)
[facility] [address] [place], [place], [address] Phone: [phone]   Fax:  [phone]  Physical Therapy Treatment  Patient Details  Name: Raymond [name] MRN: [id number] Date of Birth: September 03, 1960 Referring Provider (PT): [name], MD   Encounter Date: 12/16/2018  PT End of Session - 12/16/18 1058    Visit Number  2    Date for PT Re-Evaluation  03/26/19    PT Start Time  1057    PT Stop Time  1138    PT Time Calculation (min)  41 min    Activity Tolerance  Patient tolerated treatment well    Behavior During Therapy  [facility] for tasks assessed/performed       Past Medical History:  Diagnosis Date  . Arthritis   . At risk for sleep apnea    STOP-BANG= 4    SENT TO PCP 07-09-2014  . BPH (benign prostatic hypertrophy)   . Dental caries    periodontitis  . Depression   . Elevated PSA   . History of concussion    yrs ago-- no LOC-- no residual  . History of depression   . Hypertension   . Nocturia   . Prostate cancer ([place])   . Wears glasses     Past Surgical History:  Procedure Laterality Date  . APPENDECTOMY  age 67  . MULTIPLE EXTRACTIONS WITH ALVEOLOPLASTY N/A 10/12/2017   Procedure: MULTIPLE EXTRACTION WITH ALVEOLOPLASTY;  Surgeon: [name], DDS;  Location: [place];  Service: Oral Surgery;  Laterality: N/A;  . PELVIC LYMPH NODE DISSECTION Bilateral 11/04/2018   Procedure: PELVIC LYMPH NODE DISSECTION;  Surgeon: [name], MD;  [facility];  Service: Urology;  Laterality: Bilateral;  . PROSTATE BIOPSY N/A 07/15/2014   Procedure: SATURATION BIOPSY TRANSRECTAL ULTRASONIC PROSTATE (TUBP);  Surgeon: [name], MD;  Location[facility];  Service: Urology;  Laterality: N/A;  . PROSTATE BIOPSY N/A 08/07/2018   Procedure: BIOPSY TRANSRECTAL ULTRASONIC PROSTATE (TUBP);  Surgeon: [name], MD;  Location: [facility];  Service: Urology;   Laterality: N/A;  ONLY NEEDS 30 MIN  . RIGHT KNEE ARTHROSCOPY /  MENISECTOMY/  DEBRIDEMENT OF CHONDROMALACIA PATELLA AND MEDIAL SHELF PLICA/  SUBCHONDROPLASTY  06-10-2010  . ROBOT ASSISTED LAPAROSCOPIC RADICAL PROSTATECTOMY N/A 11/04/2018   Procedure: XI ROBOTIC ASSISTED LAPAROSCOPIC RADICAL PROSTATECTOMY;  Surgeon: [name], MD;  [facility];  Service: Urology;  Laterality: N/A;  . TOTAL KNEE ARTHROPLASTY  05/31/2011   Procedure: TOTAL KNEE ARTHROPLASTY;  Surgeon: [name], MD;  Location: [place];  Service: Orthopedics;  Laterality: Right;  with revision stem    There were no vitals filed for this visit.  Subjective Assessment - 12/16/18 1113    Subjective  I tried some stretches but I lost the paper so I just did what I could remember.  Pt states pain goes in and out.  He reports he did the perineal massage a couple times    Patient Stated Goals  not have to wear diapers    Currently in Pain?  No/denies                       [facility] Adult PT Treatment/Exercise - 12/16/18 0001      Exercises   Exercises  Lumbar      Lumbar Exercises: Stretches   Active Hamstring Stretch  Right;Left;2 reps;30 seconds    Single Knee  to Chest Stretch  Right;Left;30 seconds    Double Knee to Chest Stretch  5 reps;20 seconds    Hip Flexor Stretch  Right;Left;1 rep;30 seconds   supine   Piriformis Stretch  Right;Left;2 reps;30 seconds    Figure 4 Stretch  2 reps;20 seconds    Other Lumbar Stretch Exercise  hip flexor and butterfly stretches - 30 sec each      Lumbar Exercises: Supine   Other Supine Lumbar Exercises  kegel in hooklying with ball squeeze      Manual Therapy   Manual Therapy  Soft tissue mobilization    Soft tissue mobilization  lumbar and gluteals bilateral               PT Short Term Goals - 12/04/18 1210      PT SHORT TERM GOAL #1   Title  ind with self massage to the pelvic floor for improved functional strength    Time  4    Period   Weeks    Status  New    Target Date  01/01/19      PT SHORT TERM GOAL #2   Title  Pt ind with initial HEP    Time  4    Period  Weeks    Status  New    Target Date  01/01/19        PT Long Term Goals - 12/04/18 1205      PT LONG TERM GOAL #1   Title  Pt will be able to hold his bladder for at least 5 minutes after the initial urge in order to do normal household chores without leakage    Time  16    Period  Weeks    Status  New    Target Date  03/26/19      PT LONG TERM GOAL #2   Title  Pt will report 80% less pain around the tailbone so he can sit with good upright posture    Time  16    Period  Weeks    Status  New    Target Date  03/26/19      PT LONG TERM GOAL #3   Title  Pt will feel confident to go to the sore without wearing diaper due to improved bladder control    Time  16    Period  Weeks    Status  New    Target Date  03/26/19      PT LONG TERM GOAL #4   Title  Pt will be ind with advanced HEP    Time  16    Period  Weeks    Status  New    Target Date  03/26/19            Plan - 12/16/18 1142    Clinical Impression Statement  Pt responded well to initial treatment.  No goals met today due to first treatment.  He felt better after today's session which focused on stretching and breathing.  He was able to do intial pelvic floor exercise but was educated on focusing on the stretching at this time.  Pt will benefit from skilled PT to continue working on improved pelvic floor control for ability to control his bladder.    Comorbidities  post prostatectomy    PT Treatment/Interventions  ADLs/Self Care Home Management;Biofeedback;Cryotherapy;Electrical Stimulation;Moist Heat;Therapeutic exercise;Therapeutic activities;Neuromuscular re-education;Patient/family education;Manual techniques;Passive range of motion;Dry needling;Taping    PT Next Visit Plan  biofeedback, review stretches and breathing  and initial pelvic floor strengthening    PT Home Exercise  Plan  Access Code: [email or web address]    Consulted and Agree with Plan of Care  Patient       Patient will benefit from skilled therapeutic intervention in order to improve the following deficits and impairments:  Decreased range of motion, Increased fascial restricitons, Decreased strength, Pain, Increased muscle spasms  Visit Diagnosis: Muscle weakness (generalized)  Cramp and spasm     Problem List Patient Active Problem List   Diagnosis Date Noted  . Prostate cancer ([place]) 11/04/2018  . Malignant neoplasm of prostate ([place]) 09/13/2018  . EPIDERMOID CYST 09/07/2009  . LYMPHADENOPATHY 09/07/2009  . ABSCESS, TOOTH 05/21/2009  . HYPERTRIGLYCERIDEMIA 04/19/2009  . DEPRESSION 03/24/2009  . COCAINE ABUSE 02/15/2009  . ALCOHOL USE 02/12/2009  . TOBACCO ABUSE 02/12/2009  . HYPERTENSION 02/12/2009  . OSTEOARTHRITIS 02/12/2009  . BACK PAIN, LUMBAR 02/12/2009  . URINALYSIS, ABNORMAL 02/12/2009    Camillo Flaming Desenglau,PT 12/16/2018, 11:45 AM  [facility] [address] [place], [place], [address] Phone: [phone]   Fax:  [phone]  Name: Raymond [name] MRN: [id number] Date of Birth: Jul 07, 1960

## 2018-12-23 ENCOUNTER — Encounter: Payer: Medicaid Other | Admitting: Physical Therapy

## 2018-12-30 ENCOUNTER — Other Ambulatory Visit: Payer: Self-pay

## 2018-12-30 ENCOUNTER — Encounter: Payer: Self-pay | Admitting: Physical Therapy

## 2018-12-30 ENCOUNTER — Ambulatory Visit: Payer: Medicaid Other | Admitting: Physical Therapy

## 2018-12-30 DIAGNOSIS — M6281 Muscle weakness (generalized): Secondary | ICD-10-CM | POA: Diagnosis not present

## 2018-12-30 DIAGNOSIS — R252 Cramp and spasm: Secondary | ICD-10-CM

## 2018-12-30 NOTE — Therapy (Signed)
[facility] [address], [place], [place], [address] Phone: [phone]   Fax:  [phone]  Physical Therapy Treatment  Patient Details  Name: Raymond [name] MRN: [id number] Date of Birth: 23-Nov-1960 Referring Provider (PT): [name], MD   Encounter Date: 12/30/2018  PT End of Session - 12/30/18 0959    Visit Number  3    Date for PT Re-Evaluation  03/26/19    Authorization Type  MCAID    Authorization - Visit Number  2    Authorization - Number of Visits  3    PT Start Time  1009    PT Stop Time  1049    PT Time Calculation (min)  40 min    Activity Tolerance  Patient tolerated treatment well    Behavior During Therapy  [facility] for tasks assessed/performed       Past Medical History:  Diagnosis Date  . Arthritis   . At risk for sleep apnea    STOP-BANG= 4    SENT TO PCP 07-09-2014  . BPH (benign prostatic hypertrophy)   . Dental caries    periodontitis  . Depression   . Elevated PSA   . History of concussion    yrs ago-- no LOC-- no residual  . History of depression   . Hypertension   . Nocturia   . Prostate cancer ([place])   . Wears glasses     Past Surgical History:  Procedure Laterality Date  . APPENDECTOMY  age 38  . MULTIPLE EXTRACTIONS WITH ALVEOLOPLASTY N/A 10/12/2017   Procedure: MULTIPLE EXTRACTION WITH ALVEOLOPLASTY;  Surgeon: [name], DDS;  Location: [place];  Service: Oral Surgery;  Laterality: N/A;  . PELVIC LYMPH NODE DISSECTION Bilateral 11/04/2018   Procedure: PELVIC LYMPH NODE DISSECTION;  Surgeon: [name], MD;  [facility];  Service: Urology;  Laterality: Bilateral;  . PROSTATE BIOPSY N/A 07/15/2014   Procedure: SATURATION BIOPSY TRANSRECTAL ULTRASONIC PROSTATE (TUBP);  Surgeon: [name], MD;  Location: [facility];  Service: Urology;  Laterality: N/A;  . PROSTATE BIOPSY N/A 08/07/2018   Procedure: BIOPSY TRANSRECTAL ULTRASONIC PROSTATE  (TUBP);  Surgeon: [name], MD;  Location: [facility];  Service: Urology;  Laterality: N/A;  ONLY NEEDS 30 MIN  . RIGHT KNEE ARTHROSCOPY /  MENISECTOMY/  DEBRIDEMENT OF CHONDROMALACIA PATELLA AND MEDIAL SHELF PLICA/  SUBCHONDROPLASTY  06-10-2010  . ROBOT ASSISTED LAPAROSCOPIC RADICAL PROSTATECTOMY N/A 11/04/2018   Procedure: XI ROBOTIC ASSISTED LAPAROSCOPIC RADICAL PROSTATECTOMY;  Surgeon: [name], MD;  [facility];  Service: Urology;  Laterality: N/A;  . TOTAL KNEE ARTHROPLASTY  05/31/2011   Procedure: TOTAL KNEE ARTHROPLASTY;  Surgeon: [name], MD;  Location: [place];  Service: Orthopedics;  Laterality: Right;  with revision stem    There were no vitals filed for this visit.  Subjective Assessment - 12/30/18 1551    Subjective  Pt feels like there is a little less leakage . He reports he has been doing the exercise with the ball squeeze.    Currently in Pain?  No/denies                       [facility] Adult PT Treatment/Exercise - 12/30/18 0001      Neuro Re-ed    Neuro Re-ed Details   biofeedback; resting 2.66mv; max 76mV, 10 sec at 32mV, 20 sec at 21 mV, rest at the end  2.2 mV; exercises with hold and relax - 6-12      Lumbar Exercises: Seated   Other Seated Lumbar Exercises  kegel without co-contraction - 5 x               PT Short Term Goals - 12/30/18 1059      PT SHORT TERM GOAL #1   Title  ind with self massage to the pelvic floor for improved functional strength    Status  Achieved      PT SHORT TERM GOAL #2   Title  Pt ind with initial HEP    Status  Achieved        PT Long Term Goals - 12/30/18 1059      PT LONG TERM GOAL #1   Title  Pt will be able to hold his bladder for at least 5 minutes after the initial urge in order to do normal household chores without leakage    Baseline  can hold it 5 minutes sometimes    Status  On-going      PT LONG TERM GOAL #2   Title  Pt will report 80%  less pain around the tailbone so he can sit with good upright posture    Baseline  pt was sitting upright today and did not report pain    Status  On-going      PT LONG TERM GOAL #3   Title  Pt will feel confident to go to the sore without wearing diaper due to improved bladder control    Baseline  wears diaper duet o leakage, reports there is less leakage    Status  On-going      PT LONG TERM GOAL #4   Title  Pt will be ind with advanced HEP    Baseline  still learning    Status  On-going            Plan - 12/30/18 1100    Clinical Impression Statement  Pt has only had 2 visits and today's session being the first time pt was able to concentrate more on strength and endurance as previous session focused on stretches and self massage for tight muscles.  Pt did well using biofeedback and cues helped patient be able to improve muscle recruitment of pelvic floor. Pt was able to progress strength to sit to stand and standing with marching and demonstrates 6-10 seconds holds of >12mV.  Pt will benefit from skilled PT to continuet o work on strength and endurance of pelvic floor in order to achieve functional goals and reduce leakage.    PT Treatment/Interventions  ADLs/Self Care Home Management;Biofeedback;Cryotherapy;Electrical Stimulation;Moist Heat;Therapeutic exercise;Therapeutic activities;Neuromuscular re-education;Patient/family education;Manual techniques;Passive range of motion;Dry needling;Taping    PT Next Visit Plan  biofeedback, review stretches and exercises added previously    PT Home Exercise Plan  Access Code: [email or web address]    Consulted and Agree with Plan of Care  Patient       Patient will benefit from skilled therapeutic intervention in order to improve the following deficits and impairments:  Decreased range of motion, Increased fascial restricitons, Decreased strength, Pain, Increased muscle spasms  Visit Diagnosis: Muscle weakness (generalized)  Cramp and  spasm     Problem List Patient Active Problem List   Diagnosis Date Noted  . Prostate cancer ([place]) 11/04/2018  . Malignant neoplasm of prostate ([place]) 09/13/2018  . EPIDERMOID CYST 09/07/2009  . LYMPHADENOPATHY 09/07/2009  . ABSCESS, TOOTH 05/21/2009  . HYPERTRIGLYCERIDEMIA 04/19/2009  . DEPRESSION 03/24/2009  .  COCAINE ABUSE 02/15/2009  . ALCOHOL USE 02/12/2009  . TOBACCO ABUSE 02/12/2009  . HYPERTENSION 02/12/2009  . OSTEOARTHRITIS 02/12/2009  . BACK PAIN, LUMBAR 02/12/2009  . URINALYSIS, ABNORMAL 02/12/2009    Jule Ser, PT 12/30/2018, 3:54 PM  [facility] [address] [place], [place], [address] Phone: [phone]   Fax:  [phone]  Name: SABDIEL KERKSTRA MRN: [id number] Date of Birth: [birth year]

## 2019-01-14 ENCOUNTER — Ambulatory Visit: Payer: Medicaid Other | Admitting: Physical Therapy

## 2019-01-20 ENCOUNTER — Ambulatory Visit: Payer: Medicaid Other | Attending: Urology | Admitting: Physical Therapy

## 2019-01-20 ENCOUNTER — Encounter: Payer: Self-pay | Admitting: Physical Therapy

## 2019-01-20 ENCOUNTER — Other Ambulatory Visit: Payer: Self-pay

## 2019-01-20 DIAGNOSIS — M6281 Muscle weakness (generalized): Secondary | ICD-10-CM | POA: Diagnosis not present

## 2019-01-20 DIAGNOSIS — R252 Cramp and spasm: Secondary | ICD-10-CM | POA: Diagnosis present

## 2019-01-20 NOTE — Therapy (Signed)
[facility] [address] [place], [place], [address] Phone: [phone]   Fax:  [phone]  Physical Therapy Treatment  Patient Details  Name: Raymond [name] MRN: [id number] Date of Birth: 18-Feb-1960 Referring Provider (PT): [name], MD   Encounter Date: 01/20/2019  PT End of Session - 01/20/19 1001    Visit Number  4    Date for PT Re-Evaluation  03/26/19    Authorization Type  MCAID auth exp 02/03/19    Authorization - Visit Number  1    Authorization - Number of Visits  3    PT Start Time  1007    PT Stop Time  1050    PT Time Calculation (min)  43 min    Activity Tolerance  Patient tolerated treatment well    Behavior During Therapy  [facility] for tasks assessed/performed       Past Medical History:  Diagnosis Date  . Arthritis   . At risk for sleep apnea    STOP-BANG= 4    SENT TO PCP 07-09-2014  . BPH (benign prostatic hypertrophy)   . Dental caries    periodontitis  . Depression   . Elevated PSA   . History of concussion    yrs ago-- no LOC-- no residual  . History of depression   . Hypertension   . Nocturia   . Prostate cancer ([place])   . Wears glasses     Past Surgical History:  Procedure Laterality Date  . APPENDECTOMY  age 38  . MULTIPLE EXTRACTIONS WITH ALVEOLOPLASTY N/A 10/12/2017   Procedure: MULTIPLE EXTRACTION WITH ALVEOLOPLASTY;  Surgeon: [name], DDS;  Location: [place];  Service: Oral Surgery;  Laterality: N/A;  . PELVIC LYMPH NODE DISSECTION Bilateral 11/04/2018   Procedure: PELVIC LYMPH NODE DISSECTION;  Surgeon: [name], MD;  [facility];  Service: Urology;  Laterality: Bilateral;  . PROSTATE BIOPSY N/A 07/15/2014   Procedure: SATURATION BIOPSY TRANSRECTAL ULTRASONIC PROSTATE (TUBP);  Surgeon: [name], MD;  Location: [facility];  Service: Urology;  Laterality: N/A;  . PROSTATE BIOPSY N/A 08/07/2018   Procedure: BIOPSY TRANSRECTAL  ULTRASONIC PROSTATE (TUBP);  Surgeon: [name], MD;  Location: [facility];  Service: Urology;  Laterality: N/A;  ONLY NEEDS 30 MIN  . RIGHT KNEE ARTHROSCOPY /  MENISECTOMY/  DEBRIDEMENT OF CHONDROMALACIA PATELLA AND MEDIAL SHELF PLICA/  SUBCHONDROPLASTY  06-10-2010  . ROBOT ASSISTED LAPAROSCOPIC RADICAL PROSTATECTOMY N/A 11/04/2018   Procedure: XI ROBOTIC ASSISTED LAPAROSCOPIC RADICAL PROSTATECTOMY;  Surgeon: [name], MD;  [facility];  Service: Urology;  Laterality: N/A;  . TOTAL KNEE ARTHROPLASTY  05/31/2011   Procedure: TOTAL KNEE ARTHROPLASTY;  Surgeon: [name], MD;  Location: [place];  Service: Orthopedics;  Laterality: Right;  with revision stem    There were no vitals filed for this visit.  Subjective Assessment - 01/20/19 1014    Subjective  Pt states he is going to the bathroom every 45 min to 1 hour, but less leakage and using 2-3 diapers per day.  Also not feeling any tailbone pain and feels like things are healing.    Patient Stated Goals  not have to wear diapers    Currently in Pain?  No/denies                    Pelvic Floor Special Questions - 01/20/19 0001    Pad use  2-3  Urinary frequency  nocturia 2x        [facility] Adult PT Treatment/Exercise - 01/20/19 0001      Neuro Re-ed    Neuro Re-ed Details   cirlces and pelvic tilts on green ball ; biofeedback working on endurance      Lumbar Exercises: Seated   Sit to Stand  10 reps   with kegel both concentric and eccentric movments     Lumbar Exercises: Supine   Bridge  5 reps;5 seconds    Bridge with Ball Squeeze  5 reps;5 seconds    Other Supine Lumbar Exercises  kegel in hooklying with abduction green band - 6 sec hold - 10x               PT Short Term Goals - 12/30/18 1059      PT SHORT TERM GOAL #1   Title  ind with self massage to the pelvic floor for improved functional strength    Status  Achieved      PT SHORT TERM GOAL #2    Title  Pt ind with initial HEP    Status  Achieved        PT Long Term Goals - 12/30/18 1059      PT LONG TERM GOAL #1   Title  Pt will be able to hold his bladder for at least 5 minutes after the initial urge in order to do normal household chores without leakage    Baseline  can hold it 5 minutes sometimes    Status  On-going      PT LONG TERM GOAL #2   Title  Pt will report 80% less pain around the tailbone so he can sit with good upright posture    Baseline  pt was sitting upright today and did not report pain    Status  On-going      PT LONG TERM GOAL #3   Title  Pt will feel confident to go to the sore without wearing diaper due to improved bladder control    Baseline  wears diaper duet o leakage, reports there is less leakage    Status  On-going      PT LONG TERM GOAL #4   Title  Pt will be ind with advanced HEP    Baseline  still learning    Status  On-going            Plan - 01/20/19 1056    Clinical Impression Statement  Pt was able to advance his exercises and add to HEP today.  Overall, he is leaking less and using 1-2 less pads/day.  Pt has met goal for reduced low back pain.  No back/tailbone pain. Pt demosntrated muscles became fatigued throughout the treatment as his tone became lower towards the end of the treatment.  Pt also reports very little bit of leakage when doing the sit to stand exercises.  pt will benefit from skilled PT to work on pelvic floor strength and coordination.    PT Treatment/Interventions  ADLs/Self Care Home Management;Biofeedback;Cryotherapy;Electrical Stimulation;Moist Heat;Therapeutic exercise;Therapeutic activities;Neuromuscular re-education;Patient/family education;Manual techniques;Passive range of motion;Dry needling;Taping    PT Next Visit Plan  re-eval; biofeedback if needed, review stretches and exercises added previously    PT Home Exercise Plan  Access Code: [email or web address]    Consulted and Agree with Plan of Care  Patient        Patient will benefit from skilled therapeutic intervention in order to improve the following deficits and impairments:  Decreased range of motion, Increased fascial restricitons, Decreased strength, Pain, Increased muscle spasms  Visit Diagnosis: Muscle weakness (generalized)  Cramp and spasm     Problem List Patient Active Problem List   Diagnosis Date Noted  . Prostate cancer ([place]) 11/04/2018  . Malignant neoplasm of prostate ([place]) 09/13/2018  . EPIDERMOID CYST 09/07/2009  . LYMPHADENOPATHY 09/07/2009  . ABSCESS, TOOTH 05/21/2009  . HYPERTRIGLYCERIDEMIA 04/19/2009  . DEPRESSION 03/24/2009  . COCAINE ABUSE 02/15/2009  . ALCOHOL USE 02/12/2009  . TOBACCO ABUSE 02/12/2009  . HYPERTENSION 02/12/2009  . OSTEOARTHRITIS 02/12/2009  . BACK PAIN, LUMBAR 02/12/2009  . URINALYSIS, ABNORMAL 02/12/2009    Jule Ser, PT 01/20/2019, 10:59 AM  [facility] [address] [place], [place], [address] Phone: [phone]   Fax:  [phone]  Name: Raymond [name] MRN: [id number] Date of Birth: 11-19-1960

## 2019-01-27 ENCOUNTER — Ambulatory Visit: Payer: Medicaid Other | Admitting: Physical Therapy

## 2019-02-06 ENCOUNTER — Ambulatory Visit: Payer: Medicaid Other | Admitting: Physical Therapy

## 2019-02-24 ENCOUNTER — Ambulatory Visit: Payer: Medicaid Other | Attending: Urology | Admitting: Physical Therapy

## 2019-02-24 DIAGNOSIS — M6281 Muscle weakness (generalized): Secondary | ICD-10-CM | POA: Insufficient documentation

## 2019-02-24 DIAGNOSIS — R252 Cramp and spasm: Secondary | ICD-10-CM | POA: Insufficient documentation

## 2019-03-03 ENCOUNTER — Encounter: Payer: Self-pay | Admitting: Physical Therapy

## 2019-03-03 ENCOUNTER — Other Ambulatory Visit: Payer: Self-pay

## 2019-03-03 ENCOUNTER — Ambulatory Visit: Payer: Medicaid Other | Admitting: Physical Therapy

## 2019-03-03 DIAGNOSIS — M6281 Muscle weakness (generalized): Secondary | ICD-10-CM | POA: Diagnosis not present

## 2019-03-03 DIAGNOSIS — R252 Cramp and spasm: Secondary | ICD-10-CM | POA: Diagnosis present

## 2019-03-03 NOTE — Therapy (Signed)
[facility] [address] [place], [place], [address] Phone: [phone]   Fax:  [phone]  Physical Therapy Treatment  Patient Details  Name: Raymond [name] MRN: [id number] Date of Birth: 15-Jan-1960 Referring Provider (PT): [name], MD   Encounter Date: 03/03/2019  PT End of Session - 03/03/19 0826    Visit Number  5    Date for PT Re-Evaluation  03/26/19    Authorization Type  MCAID auth exp 03/27/19    PT Start Time  0830    PT Stop Time  0912    PT Time Calculation (min)  42 min    Activity Tolerance  Patient tolerated treatment well    Behavior During Therapy  [facility] for tasks assessed/performed       Past Medical History:  Diagnosis Date  . Arthritis   . At risk for sleep apnea    STOP-BANG= 4    SENT TO PCP 07-09-2014  . BPH (benign prostatic hypertrophy)   . Dental caries    periodontitis  . Depression   . Elevated PSA   . History of concussion    yrs ago-- no LOC-- no residual  . History of depression   . Hypertension   . Nocturia   . Prostate cancer ([place])   . Wears glasses     Past Surgical History:  Procedure Laterality Date  . APPENDECTOMY  age 89  . MULTIPLE EXTRACTIONS WITH ALVEOLOPLASTY N/A 10/12/2017   Procedure: MULTIPLE EXTRACTION WITH ALVEOLOPLASTY;  Surgeon: [name], DDS;  Location: [place];  Service: Oral Surgery;  Laterality: N/A;  . PELVIC LYMPH NODE DISSECTION Bilateral 11/04/2018   Procedure: PELVIC LYMPH NODE DISSECTION;  Surgeon: [name], MD;  [facility];  Service: Urology;  Laterality: Bilateral;  . PROSTATE BIOPSY N/A 07/15/2014   Procedure: SATURATION BIOPSY TRANSRECTAL ULTRASONIC PROSTATE (TUBP);  Surgeon: [name], MD;  Location: [facility];  Service: Urology;  Laterality: N/A;  . PROSTATE BIOPSY N/A 08/07/2018   Procedure: BIOPSY TRANSRECTAL ULTRASONIC PROSTATE (TUBP);  Surgeon: [name], MD;  Location:  [facility];  Service: Urology;  Laterality: N/A;  ONLY NEEDS 30 MIN  . RIGHT KNEE ARTHROSCOPY /  MENISECTOMY/  DEBRIDEMENT OF CHONDROMALACIA PATELLA AND MEDIAL SHELF PLICA/  SUBCHONDROPLASTY  06-10-2010  . ROBOT ASSISTED LAPAROSCOPIC RADICAL PROSTATECTOMY N/A 11/04/2018   Procedure: XI ROBOTIC ASSISTED LAPAROSCOPIC RADICAL PROSTATECTOMY;  Surgeon: [name], MD;  [facility];  Service: Urology;  Laterality: N/A;  . TOTAL KNEE ARTHROPLASTY  05/31/2011   Procedure: TOTAL KNEE ARTHROPLASTY;  Surgeon: [name], MD;  Location: [place];  Service: Orthopedics;  Laterality: Right;  with revision stem    There were no vitals filed for this visit.  Subjective Assessment - 03/03/19 0833    Subjective  Pt states he hasn't worn the liners.  He hasn't needed the liners for several.    Currently in Pain?  No/denies                       [facility] Adult PT Treatment/Exercise - 03/03/19 0001      Self-Care   Self-Care  Other Self-Care Comments    Other Self-Care Comments   reviewed and gave print out for bladder urge drills      Lumbar Exercises: Stretches   Active Hamstring Stretch  Right;Left;2 reps;30 seconds    Gastroc Stretch  Right;Left;3 reps;20 seconds  Lumbar Exercises: Aerobic   Nustep  L3 x 8 min PT present for status - 30 sec intervals x 4      Lumbar Exercises: Standing   Functional Squats  20 reps    Other Standing Lumbar Exercises  shoulder press 25# in staggered stance; shoulder ext 25#; heel slides back and side - all with kegel and 20x    Other Standing Lumbar Exercises  side step 10 x each side             PT Education - 03/03/19 0910    Education Details  Access Code: [email or web address]    Person(s) Educated  Patient    Methods  Explanation;Demonstration;Verbal cues;Handout    Comprehension  Verbalized understanding;Returned demonstration       PT Short Term Goals - 12/30/18 1059      PT SHORT TERM GOAL #1   Title  ind  with self massage to the pelvic floor for improved functional strength    Status  Achieved      PT SHORT TERM GOAL #2   Title  Pt ind with initial HEP    Status  Achieved        PT Long Term Goals - 03/03/19 0834      PT LONG TERM GOAL #1   Title  Pt will be able to hold his bladder for at least 5 minutes after the initial urge in order to do normal household chores without leakage    Baseline  usually can hold    Status  Achieved      PT LONG TERM GOAL #2   Title  Pt will report 80% less pain around the tailbone so he can sit with good upright posture    Baseline  denies pain    Status  Achieved      PT LONG TERM GOAL #3   Title  Pt will feel confident to go to the sore without wearing diaper due to improved bladder control    Baseline  not wearing anymore    Status  Achieved      PT LONG TERM GOAL #4   Title  Pt will be ind with advanced HEP    Status  Achieved            Plan - 03/03/19 0844    Clinical Impression Statement  Pt has met most of his goals.  He is still peeing just in case before leaving the house and states he feels a lot of urgency sometimes depending on how much water he drinks.  He did well with exercise progression today. He will benefit from skilled PT to work on coordination and endurance of pelvic floor muscles    Comorbidities  post prostatectomy    PT Treatment/Interventions  ADLs/Self Care Home Management;Biofeedback;Cryotherapy;Electrical Stimulation;Moist Heat;Therapeutic exercise;Therapeutic activities;Neuromuscular re-education;Patient/family education;Manual techniques;Passive range of motion;Dry needling;Taping    PT Next Visit Plan  porgress exercises in HEP; plan on discharge in 1-2 visits    PT Home Exercise Plan  Access Code: [email or web address]    Consulted and Agree with Plan of Care  Patient       Patient will benefit from skilled therapeutic intervention in order to improve the following deficits and impairments:  Decreased range of  motion, Increased fascial restricitons, Decreased strength, Pain, Increased muscle spasms  Visit Diagnosis: Muscle weakness (generalized)  Cramp and spasm     Problem List Patient Active Problem List   Diagnosis Date Noted  . Prostate cancer ([place]) 11/04/2018  .  Malignant neoplasm of prostate ([place]) 09/13/2018  . EPIDERMOID CYST 09/07/2009  . LYMPHADENOPATHY 09/07/2009  . ABSCESS, TOOTH 05/21/2009  . HYPERTRIGLYCERIDEMIA 04/19/2009  . DEPRESSION 03/24/2009  . COCAINE ABUSE 02/15/2009  . ALCOHOL USE 02/12/2009  . TOBACCO ABUSE 02/12/2009  . HYPERTENSION 02/12/2009  . OSTEOARTHRITIS 02/12/2009  . BACK PAIN, LUMBAR 02/12/2009  . URINALYSIS, ABNORMAL 02/12/2009    Jule Ser, PT 03/03/2019, 9:18 AM  [facility] [address], [place], [place], [address] Phone: [phone]   Fax:  [phone]  Name: STEIN WINDHORST MRN: [id number] Date of Birth: Apr 28, 1960

## 2019-03-03 NOTE — Patient Instructions (Signed)
Access Code: [email or web address]  URL: [email or web address]  Date: 03/03/2019  Prepared by: Jari Favre   Exercises Supine Butterfly Groin Stretch - 1 reps - 1 sets - 1 min hold - 1x daily - 7x weekly Supine Hamstring Stretch - 2 reps - 1 sets - sec hold - 1x daily - 7x weekly Supine Figure 4 Piriformis Stretch - 2 reps - 1 sets - 30 sec hold - 1x daily - 7x weekly Supine Piriformis Stretch with Foot on Ground - 2 reps - 1 sets - 30 sec hold - 1x daily - 7x weekly Supine Quadriceps Stretch with Strap on Table - 2 reps - 1 sets - 30 sec hold - 1x daily - 7x weekly Seated Piriformis Stretch with Trunk Bend - 2 reps - 1 sets - 30 sec hold - 1x daily - 7x weekly Seated Hamstring Stretch - 2 reps - 1 sets - 30 sec hold - 1x daily - 7x weekly Walking with Pelvic Floor Contraction - 10 reps - 3 sets - 1x daily - 7x weekly Sit to Stand with Pelvic Floor Contraction - 10 reps - 3 sets - 1x daily - 7x weekly Supine Bridge with Resistance Band - 10 reps - 1 sets - 1x daily - 7x weekly Supine Bridge with Mini Swiss Ball Between Knees - 10 reps - 1 sets - 1x daily - 7x weekly Lunge with Pelvic Floor Contraction - 10 reps - 1 sets - 1x daily - 7x weekly Tall Kneeling Anti-Rotation Press - 10 reps - 3 sets - 1x daily - 7x weekly

## 2019-03-10 ENCOUNTER — Encounter: Payer: Medicaid Other | Admitting: Physical Therapy

## 2019-03-13 ENCOUNTER — Ambulatory Visit: Payer: Medicaid Other | Attending: Internal Medicine

## 2019-03-13 DIAGNOSIS — Z20822 Contact with and (suspected) exposure to covid-19: Secondary | ICD-10-CM

## 2019-03-14 LAB — NOVEL CORONAVIRUS, NAA: SARS-CoV-2, NAA: NOT DETECTED

## 2019-03-16 ENCOUNTER — Telehealth: Payer: Self-pay

## 2019-03-16 NOTE — Telephone Encounter (Signed)

## 2019-03-17 ENCOUNTER — Ambulatory Visit: Payer: Medicaid Other | Attending: Urology | Admitting: Physical Therapy

## 2019-03-17 ENCOUNTER — Other Ambulatory Visit: Payer: Self-pay

## 2019-03-17 ENCOUNTER — Encounter: Payer: Self-pay | Admitting: Physical Therapy

## 2019-03-17 DIAGNOSIS — R252 Cramp and spasm: Secondary | ICD-10-CM | POA: Diagnosis present

## 2019-03-17 DIAGNOSIS — M6281 Muscle weakness (generalized): Secondary | ICD-10-CM | POA: Diagnosis present

## 2019-03-17 NOTE — Therapy (Signed)
[facility] [address], [place], [place], [address] Phone: [phone]   Fax:  [phone]  Physical Therapy Treatment  Patient Details  Name: Raymond [name] MRN: [id number] Date of Birth: 25-Jun-1960 Referring Provider (PT): [name], MD   Encounter Date: 03/17/2019  PT End of Session - 03/17/19 1001    Visit Number  6    Date for PT Re-Evaluation  03/26/19    Authorization Type  [employer] auth exp 03/27/19    PT Start Time  1010    PT Stop Time  1048    PT Time Calculation (min)  38 min    Activity Tolerance  Patient tolerated treatment well    Behavior During Therapy  [facility] for tasks assessed/performed       Past Medical History:  Diagnosis Date  . Arthritis   . At risk for sleep apnea    STOP-BANG= 4    SENT TO PCP 07-09-2014  . BPH (benign prostatic hypertrophy)   . Dental caries    periodontitis  . Depression   . Elevated PSA   . History of concussion    yrs ago-- no LOC-- no residual  . History of depression   . Hypertension   . Nocturia   . Prostate cancer ([place])   . Wears glasses     Past Surgical History:  Procedure Laterality Date  . APPENDECTOMY  age 89  . MULTIPLE EXTRACTIONS WITH ALVEOLOPLASTY N/A 10/12/2017   Procedure: MULTIPLE EXTRACTION WITH ALVEOLOPLASTY;  Surgeon: [name], DDS;  Location: [place];  Service: Oral Surgery;  Laterality: N/A;  . PELVIC LYMPH NODE DISSECTION Bilateral 11/04/2018   Procedure: PELVIC LYMPH NODE DISSECTION;  Surgeon: [name], MD;  Location[facility];  Service: Urology;  Laterality: Bilateral;  . PROSTATE BIOPSY N/A 07/15/2014   Procedure: SATURATION BIOPSY TRANSRECTAL ULTRASONIC PROSTATE (TUBP);  Surgeon: [name], MD;  Location: [facility];  Service: Urology;  Laterality: N/A;  . PROSTATE BIOPSY N/A 08/07/2018   Procedure: BIOPSY TRANSRECTAL ULTRASONIC PROSTATE (TUBP);  Surgeon: [name], MD;  Location:  [facility];  Service: Urology;  Laterality: N/A;  ONLY NEEDS 30 MIN  . RIGHT KNEE ARTHROSCOPY /  MENISECTOMY/  DEBRIDEMENT OF CHONDROMALACIA PATELLA AND MEDIAL SHELF PLICA/  SUBCHONDROPLASTY  06-10-2010  . ROBOT ASSISTED LAPAROSCOPIC RADICAL PROSTATECTOMY N/A 11/04/2018   Procedure: XI ROBOTIC ASSISTED LAPAROSCOPIC RADICAL PROSTATECTOMY;  Surgeon: [name], MD;  Location[facility];  Service: Urology;  Laterality: N/A;  . TOTAL KNEE ARTHROPLASTY  05/31/2011   Procedure: TOTAL KNEE ARTHROPLASTY;  Surgeon: [name], MD;  Location: [place];  Service: Orthopedics;  Laterality: Right;  with revision stem    There were no vitals filed for this visit.  Subjective Assessment - 03/17/19 1037    Subjective  Pt states he is still doing well and still getting better slowly . States he would feel more comfortable with his HEP if he can continue PT for a couple more visits.    Patient Stated Goals  not have to wear diapers    Currently in Pain?  No/denies                       [facility] Adult PT Treatment/Exercise - 03/17/19 0001      Lumbar Exercises: Stretches   Active Hamstring Stretch  Right;Left;2 reps;30 seconds    Gastroc Stretch  Right;Left;3 reps;20 seconds  Lumbar Exercises: Aerobic   Nustep  L3-5 x 10 min PT present for status -      Lumbar Exercises: Standing   Lifting Limitations  pallof press with rotation - yellow band - NBOS on black foam mat - 10x each side    Other Standing Lumbar Exercises  fwd step up 10x bilat; side step up 10x bilat    2lb ankle weights - alternate leg flex/abd   Other Standing Lumbar Exercises  hip adduction, abduction - yellow - 10x bilat               PT Short Term Goals - 12/30/18 1059      PT SHORT TERM GOAL #1   Title  ind with self massage to the pelvic floor for improved functional strength    Status  Achieved      PT SHORT TERM GOAL #2   Title  Pt ind with initial HEP    Status  Achieved         PT Long Term Goals - 03/03/19 0834      PT LONG TERM GOAL #1   Title  Pt will be able to hold his bladder for at least 5 minutes after the initial urge in order to do normal household chores without leakage    Baseline  usually can hold    Status  Achieved      PT LONG TERM GOAL #2   Title  Pt will report 80% less pain around the tailbone so he can sit with good upright posture    Baseline  denies pain    Status  Achieved      PT LONG TERM GOAL #3   Title  Pt will feel confident to go to the sore without wearing diaper due to improved bladder control    Baseline  not wearing anymore    Status  Achieved      PT LONG TERM GOAL #4   Title  Pt will be ind with advanced HEP    Status  Achieved            Plan - 03/17/19 1039    Clinical Impression Statement  Pt was able to progress resistance exercises and coordinate with pelvic floor and core activation.  Pt will benefit from one more visit to ensure successful transition to being independent with HEP.    PT Treatment/Interventions  ADLs/Self Care Home Management;Biofeedback;Cryotherapy;Electrical Stimulation;Moist Heat;Therapeutic exercise;Therapeutic activities;Neuromuscular re-education;Patient/family education;Manual techniques;Passive range of motion;Dry needling;Taping    PT Next Visit Plan  d/c next; review and finalize HEP    PT Home Exercise Plan  Access Code: [email or web address]    Consulted and Agree with Plan of Care  Patient       Patient will benefit from skilled therapeutic intervention in order to improve the following deficits and impairments:  Decreased range of motion, Increased fascial restricitons, Decreased strength, Pain, Increased muscle spasms  Visit Diagnosis: Muscle weakness (generalized)  Cramp and spasm     Problem List Patient Active Problem List   Diagnosis Date Noted  . Prostate cancer ([place]) 11/04/2018  . Malignant neoplasm of prostate ([place]) 09/13/2018  . EPIDERMOID CYST 09/07/2009  .  LYMPHADENOPATHY 09/07/2009  . ABSCESS, TOOTH 05/21/2009  . HYPERTRIGLYCERIDEMIA 04/19/2009  . DEPRESSION 03/24/2009  . COCAINE ABUSE 02/15/2009  . ALCOHOL USE 02/12/2009  . TOBACCO ABUSE 02/12/2009  . HYPERTENSION 02/12/2009  . OSTEOARTHRITIS 02/12/2009  . BACK PAIN, LUMBAR 02/12/2009  . URINALYSIS, ABNORMAL 02/12/2009    Janey Genta  L Kellene Mccleary, PT 03/17/2019, 10:49 AM  [facility] [address] [place], [place], [address] Phone: [phone]   Fax:  [phone]  Name: Raymond [name] MRN: [id number] Date of Birth: 10-Dec-1960

## 2019-03-24 ENCOUNTER — Other Ambulatory Visit: Payer: Self-pay

## 2019-03-24 ENCOUNTER — Ambulatory Visit: Payer: Medicaid Other | Admitting: Physical Therapy

## 2019-03-24 ENCOUNTER — Encounter: Payer: Self-pay | Admitting: Physical Therapy

## 2019-03-24 DIAGNOSIS — R252 Cramp and spasm: Secondary | ICD-10-CM

## 2019-03-24 DIAGNOSIS — M6281 Muscle weakness (generalized): Secondary | ICD-10-CM

## 2019-03-24 NOTE — Patient Instructions (Signed)
Access Code: [email or web address] URL: [email or web address] Date: 03/24/2019 Prepared by: Jari Favre  Exercises Supine Butterfly Groin Stretch - 1 x daily - 7 x weekly - 1 reps - 1 sets - 1 min hold Supine Hamstring Stretch - 1 x daily - 7 x weekly - 2 reps - 1 sets - sec hold Supine Figure 4 Piriformis Stretch - 1 x daily - 7 x weekly - 2 reps - 1 sets - 30 sec hold Supine Piriformis Stretch with Foot on Ground - 1 x daily - 7 x weekly - 2 reps - 1 sets - 30 sec hold Supine Quadriceps Stretch with Strap on Table - 1 x daily - 7 x weekly - 2 reps - 1 sets - 30 sec hold Seated Piriformis Stretch with Trunk Bend - 1 x daily - 7 x weekly - 2 reps - 1 sets - 30 sec hold Seated Hamstring Stretch - 1 x daily - 7 x weekly - 2 reps - 1 sets - 30 sec hold Walking with Pelvic Floor Contraction - 1 x daily - 7 x weekly - 10 reps - 3 sets Sit to Stand with Pelvic Floor Contraction - 1 x daily - 7 x weekly - 10 reps - 3 sets Supine Bridge with Resistance Band - 1 x daily - 7 x weekly - 10 reps - 1 sets Supine Bridge with Mini Swiss Ball Between Knees - 1 x daily - 7 x weekly - 10 reps - 1 sets Lunge with Pelvic Floor Contraction - 1 x daily - 7 x weekly - 10 reps - 1 sets Tall Kneeling Anti-Rotation Press - 1 x daily - 7 x weekly - 10 reps - 3 sets Wall Slide with Posterior Pelvic Tilt - 1 x daily - 7 x weekly - 2 sets - 10 reps - 5 hold

## 2019-03-24 NOTE — Therapy (Addendum)
[facility] [address] [place], [place], [address] Phone: [phone]   Fax:  [phone]  Physical Therapy Treatment  Patient Details  Name: Raymond [name] MRN: [id number] Date of Birth: 1960-04-02 Referring Provider (PT): [name], MD   Encounter Date: 03/24/2019  PT End of Session - 03/24/19 0927    Visit Number  7    Date for PT Re-Evaluation  03/26/19    Authorization Type  [employer] auth exp 03/27/19    PT Start Time  0930    PT Stop Time  1008    PT Time Calculation (min)  38 min    Activity Tolerance  Patient tolerated treatment well    Behavior During Therapy  [facility] for tasks assessed/performed       Past Medical History:  Diagnosis Date  . Arthritis   . At risk for sleep apnea    STOP-BANG= 4    SENT TO PCP 07-09-2014  . BPH (benign prostatic hypertrophy)   . Dental caries    periodontitis  . Depression   . Elevated PSA   . History of concussion    yrs ago-- no LOC-- no residual  . History of depression   . Hypertension   . Nocturia   . Prostate cancer ([place])   . Wears glasses     Past Surgical History:  Procedure Laterality Date  . APPENDECTOMY  age 62  . MULTIPLE EXTRACTIONS WITH ALVEOLOPLASTY N/A 10/12/2017   Procedure: MULTIPLE EXTRACTION WITH ALVEOLOPLASTY;  Surgeon: [name], DDS;  Location: [place];  Service: Oral Surgery;  Laterality: N/A;  . PELVIC LYMPH NODE DISSECTION Bilateral 11/04/2018   Procedure: PELVIC LYMPH NODE DISSECTION;  Surgeon: [name], MD;  [facility];  Service: Urology;  Laterality: Bilateral;  . PROSTATE BIOPSY N/A 07/15/2014   Procedure: SATURATION BIOPSY TRANSRECTAL ULTRASONIC PROSTATE (TUBP);  Surgeon: [name], MD;  Location: [facility];  Service: Urology;  Laterality: N/A;  . PROSTATE BIOPSY N/A 08/07/2018   Procedure: BIOPSY TRANSRECTAL ULTRASONIC PROSTATE (TUBP);  Surgeon: [name], MD;  Location:  [facility];  Service: Urology;  Laterality: N/A;  ONLY NEEDS 30 MIN  . RIGHT KNEE ARTHROSCOPY /  MENISECTOMY/  DEBRIDEMENT OF CHONDROMALACIA PATELLA AND MEDIAL SHELF PLICA/  SUBCHONDROPLASTY  06-10-2010  . ROBOT ASSISTED LAPAROSCOPIC RADICAL PROSTATECTOMY N/A 11/04/2018   Procedure: XI ROBOTIC ASSISTED LAPAROSCOPIC RADICAL PROSTATECTOMY;  Surgeon: [name], MD;  [facility];  Service: Urology;  Laterality: N/A;  . TOTAL KNEE ARTHROPLASTY  05/31/2011   Procedure: TOTAL KNEE ARTHROPLASTY;  Surgeon: [name], MD;  Location: [place];  Service: Orthopedics;  Laterality: Right;  with revision stem    There were no vitals filed for this visit.  Subjective Assessment - 03/24/19 1027    Subjective  I feel 80% better and just some leakage at night.  Feels like it is getting better.    Patient Stated Goals  not have to wear diapers    Currently in Pain?  No/denies                       [facility] Adult PT Treatment/Exercise - 03/24/19 0001      Lumbar Exercises: Stretches   Active Hamstring Stretch  Right;Left;2 reps;30 seconds    Hip Flexor Stretch  Right;Left;1 rep;20 seconds    Quad Stretch  Right;Left;2 reps;30 seconds      Lumbar  Exercises: Aerobic   Nustep  L3 x 4 min L5 x  71mn PT present for status -      Lumbar Exercises: Standing   Functional Squats Limitations  wall slides with ball squeeze 3x 10 sec    Wall Slides  10 reps;5 seconds    Wall Slides Limitations  red band for glute activation    Shoulder Extension  Strengthening;Right;Left;10 reps;Theraband    Theraband Level (Shoulder Extension)  Level 3 (Green)   5 sec hold   Other Standing Lumbar Exercises  fwd step up 10x bilat; side step up 10x bilat    2lb ankle weights - alternate leg flex/abd   Other Standing Lumbar Exercises  hip extension 2lb 10x each side      Lumbar Exercises: Supine   AB Set Limitations  pelvic contraction in hooklying - 20 sec x 2             PT  Education - 03/24/19 1011    Education Details  Access Code: [email or web address]    Person(s) Educated  Patient    Methods  Explanation;Demonstration;Verbal cues;Handout    Comprehension  Verbalized understanding;Returned demonstration       PT Short Term Goals - 12/30/18 1059      PT SHORT TERM GOAL #1   Title  ind with self massage to the pelvic floor for improved functional strength    Status  Achieved      PT SHORT TERM GOAL #2   Title  Pt ind with initial HEP    Status  Achieved        PT Long Term Goals - 03/24/19 1005      PT LONG TERM GOAL #1   Title  Pt will be able to hold his bladder for at least 5 minutes after the initial urge in order to do normal household chores without leakage    Status  Achieved      PT LONG TERM GOAL #2   Title  Pt will report 80% less pain around the tailbone so he can sit with good upright posture    Baseline  denies pain    Status  Achieved      PT LONG TERM GOAL #3   Title  Pt will feel confident to go to the sore without wearing diaper due to improved bladder control    Status  Achieved      PT LONG TERM GOAL #4   Title  Pt will be ind with advanced HEP    Status  Achieved            Plan - 03/24/19 1012    Clinical Impression Statement  Pt has been successful with PT.  He has met all goals and the only time he reports leakage is sometimes a little during the night.  States he feels confident with his HEP and he will discharge today.    Comorbidities  post prostatectomy    Examination-Activity Limitations  Toileting;Continence    PT Treatment/Interventions  ADLs/Self Care Home Management;Biofeedback;Cryotherapy;Electrical Stimulation;Moist Heat;Therapeutic exercise;Therapeutic activities;Neuromuscular re-education;Patient/family education;Manual techniques;Passive range of motion;Dry needling;Taping    PT Next Visit Plan  d/c'd    PT Home Exercise Plan  Access Code: [email or web address]    Consulted and Agree with Plan of Care  Patient        Patient will benefit from skilled therapeutic intervention in order to improve the following deficits and impairments:  Decreased range of motion, Increased fascial restricitons, Decreased strength, Pain, Increased muscle  spasms  Visit Diagnosis: Muscle weakness (generalized)  Cramp and spasm     Problem List Patient Active Problem List   Diagnosis Date Noted  . Prostate cancer ([place]) 11/04/2018  . Malignant neoplasm of prostate ([place]) 09/13/2018  . EPIDERMOID CYST 09/07/2009  . LYMPHADENOPATHY 09/07/2009  . ABSCESS, TOOTH 05/21/2009  . HYPERTRIGLYCERIDEMIA 04/19/2009  . DEPRESSION 03/24/2009  . COCAINE ABUSE 02/15/2009  . ALCOHOL USE 02/12/2009  . TOBACCO ABUSE 02/12/2009  . HYPERTENSION 02/12/2009  . OSTEOARTHRITIS 02/12/2009  . BACK PAIN, LUMBAR 02/12/2009  . URINALYSIS, ABNORMAL 02/12/2009    Jule Ser, PT 03/24/2019, 10:28 AM  [facility] [address], [place], [place], [address] Phone: [phone]   Fax:  [phone]  Name: BRANDI ARMATO MRN: [id number] Date of Birth: Jul 29, 1960  PHYSICAL THERAPY DISCHARGE SUMMARY  Visits from Start of Care: 7  Current functional level related to goals / functional outcomes: See above   Remaining deficits: See details above   Education / Equipment: HEP  Plan: Patient agrees to discharge.  Patient goals were met. Patient is being discharged due to meeting the stated rehab goals.  ?????    [employer], PT 03/24/19 10:35 AM

## 2019-04-25 ENCOUNTER — Other Ambulatory Visit: Payer: Self-pay

## 2019-04-25 ENCOUNTER — Ambulatory Visit
Admission: RE | Admit: 2019-04-25 | Discharge: 2019-04-25 | Disposition: A | Discharge status: Home / Self Care | Payer: Medicaid Other | Source: Ambulatory Visit | Attending: Radiation Oncology | Admitting: Radiation Oncology

## 2019-04-25 ENCOUNTER — Encounter: Payer: Self-pay | Admitting: Urology

## 2019-04-25 ENCOUNTER — Ambulatory Visit
Admission: RE | Admit: 2019-04-25 | Discharge: 2019-04-25 | Disposition: A | Discharge status: Home / Self Care | Payer: Medicaid Other | Source: Ambulatory Visit | Attending: Urology | Admitting: Urology

## 2019-04-25 DIAGNOSIS — C61 Malignant neoplasm of prostate: Secondary | ICD-10-CM

## 2019-04-25 NOTE — Progress Notes (Signed)
Radiation Oncology         [phone]) [phone] ________________________________  Outpatient Re-Consultation - Conducted via Telephone due to current COVID-19 concerns for limiting patient exposure  Name: Raymond [name] MRN: [id number]  Date: 04/25/2019  DOB: 1960-08-20  CC:Bonsu, Osei A, DO  Bell, Desiree Hane, MD   REFERRING PHYSICIAN: [name], MD  DIAGNOSIS: 59 y.o. gentleman with rising PSA s/p RALP in 10/2018 for stage pT3a, pN0 Gleason 4+5 with EPE and a positive margin.    ICD-10-CM   1. Malignant neoplasm of prostate ([place])  C61     HISTORY OF PRESENT ILLNESS: Raymond [name] is a 59 y.o. male with a diagnosis of prostate cancer. We initially met the patient in 09/2018 to discuss treatment options of his newly diagnosed Gleason 4+3 adenocarcinoma of the prostate. At that time, he elected to proceed with prostatectomy under the care of Dr. Gloriann Loan. RALP with BPLND was performed on 11/04/2018 with final surgical pathology revealing an upstaging to Gleason 4+5 prostatic adenocarcinoma with extraprostatic extension and a 1.5 cm positive margin at the bladder neck. All biopsied lymph nodes were negative (0/4) and there was no seminal vesicle involvement.  His initial postoperative PSA was undetectable in 12/2018 but unfortunately became detectable at 0.03 in 03/2019.  The patient reviewed the pathology and PSA results with his urologist and he has kindly been referred today for discussion of potential radiation treatment options.  PREVIOUS RADIATION THERAPY: No  PAST MEDICAL HISTORY:  Past Medical History:  Diagnosis Date  . Arthritis   . At risk for sleep apnea    STOP-BANG= 4    SENT TO PCP 07-09-2014  . BPH (benign prostatic hypertrophy)   . Dental caries    periodontitis  . Depression   . Elevated PSA   . History of concussion    yrs ago-- no LOC-- no residual  . History of depression   . Hypertension   . Nocturia   . Prostate cancer ([place])   . Wears glasses       PAST  SURGICAL HISTORY: Past Surgical History:  Procedure Laterality Date  . APPENDECTOMY  age 32  . MULTIPLE EXTRACTIONS WITH ALVEOLOPLASTY N/A 10/12/2017   Procedure: MULTIPLE EXTRACTION WITH ALVEOLOPLASTY;  Surgeon: [name], DDS;  Location: [facility];  Service: Oral Surgery;  Laterality: N/A;  . PELVIC LYMPH NODE DISSECTION Bilateral 11/04/2018   Procedure: PELVIC LYMPH NODE DISSECTION;  Surgeon: [name], MD;  Location: [facility];  Service: Urology;  Laterality: Bilateral;  . PROSTATE BIOPSY N/A 07/15/2014   Procedure: SATURATION BIOPSY TRANSRECTAL ULTRASONIC PROSTATE (TUBP);  Surgeon: [name], MD;  Location: [facility];  Service: Urology;  Laterality: N/A;  . PROSTATE BIOPSY N/A 08/07/2018   Procedure: BIOPSY TRANSRECTAL ULTRASONIC PROSTATE (TUBP);  Surgeon: [name], MD;  Location: [facility];  Service: Urology;  Laterality: N/A;  ONLY NEEDS 30 MIN  . RIGHT KNEE ARTHROSCOPY /  MENISECTOMY/  DEBRIDEMENT OF CHONDROMALACIA PATELLA AND MEDIAL SHELF PLICA/  SUBCHONDROPLASTY  06-10-2010  . ROBOT ASSISTED LAPAROSCOPIC RADICAL PROSTATECTOMY N/A 11/04/2018   Procedure: XI ROBOTIC ASSISTED LAPAROSCOPIC RADICAL PROSTATECTOMY;  Surgeon: [name], MD;  Location: [facility];  Service: Urology;  Laterality: N/A;  . TOTAL KNEE ARTHROPLASTY  05/31/2011   Procedure: TOTAL KNEE ARTHROPLASTY;  Surgeon: [name], MD;  Location: [facility];  Service: Orthopedics;  Laterality: Right;  with revision stem    FAMILY HISTORY:  Family History  Problem Relation Age of Onset  . Prostate cancer Neg Hx   . Colon cancer Neg Hx   . Breast cancer Neg Hx     SOCIAL HISTORY:  Social History   Socioeconomic History  . Marital status: Significant Other    Spouse name: Not on file  . Number of children: 4  . Years of education: Not on file  . Highest education level: Not on file  Occupational History    Comment: disability  Tobacco Use  . Smoking  status: Former Smoker    Packs/day: 0.50    Years: 4.00    Pack years: 2.00    Types: Cigarettes    Quit date: 08/28/2018    Years since quitting: 0.6  . Smokeless tobacco: Never Used  Substance and Sexual Activity  . Alcohol use: Yes    Comment: occasional  . Drug use: No    Comment: hx marijuana use- none recently states on 10/30/2018  . Sexual activity: Yes  Other Topics Concern  . Not on file  Social History Narrative   His daughter is a Marine scientist at [employer], who works night shift.   Social Determinants of Health   Financial Resource Strain:   . Difficulty of Paying Living Expenses:   Food Insecurity:   . Worried About Charity fundraiser in the Last Year:   . Arboriculturist in the Last Year:   Transportation Needs:   . Film/video editor (Medical):   Marland Kitchen Lack of Transportation (Non-Medical):   Physical Activity:   . Days of Exercise per Week:   . Minutes of Exercise per Session:   Stress:   . Feeling of Stress :   Social Connections:   . Frequency of Communication with Friends and Family:   . Frequency of Social Gatherings with Friends and Family:   . Attends Religious Services:   . Active Member of Clubs or Organizations:   . Attends Archivist Meetings:   Marland Kitchen Marital Status:   Intimate Partner Violence:   . Fear of Current or Ex-Partner:   . Emotionally Abused:   Marland Kitchen Physically Abused:   . Sexually Abused:     ALLERGIES: Patient has no known allergies.  MEDICATIONS:  Current Outpatient Medications  Medication Sig Dispense Refill  . atorvastatin (LIPITOR) 10 MG tablet Take 10 mg by mouth daily.    Marland Kitchen gemfibrozil (LOPID) 600 MG tablet Take 600 mg by mouth 2 (two) times daily.  3  . lisinopril-hydrochlorothiazide (PRINZIDE,ZESTORETIC) 20-25 MG per tablet Take 1 tablet by mouth every morning.    . sildenafil (REVATIO) 20 MG tablet Take 20 mg by mouth daily as needed (ED).     No current facility-administered medications for this encounter.     Facility-Administered Medications Ordered in Other Encounters  Medication Dose Route Frequency Provider Last Rate Last Admin  . sodium phosphate (FLEET) 7-19 GM/118ML enema 1 enema  1 enema Rectal Once Winter, Conception Oms, MD        REVIEW OF SYSTEMS:  On review of systems, the patient reports that he is doing well overall. He denies any chest pain, shortness of breath, cough, fevers, chills, night sweats, unintended weight changes. IPSS is 6, indicating mild urinary symptoms. He has regained good continence since his procedure and no longer requires pads for protection. He denies any bowel disturbances, and denies abdominal pain, nausea or vomiting. A complete review of systems is obtained and is otherwise negative.  PHYSICAL EXAM:  Wt  Readings from Last 3 Encounters:  11/04/18 184 lb 6.4 oz (83.6 kg)  10/30/18 184 lb 6.4 oz (83.6 kg)  09/13/18 182 lb (82.6 kg)   Temp Readings from Last 3 Encounters:  11/05/18 98.6 F (37 C) (Oral)  10/30/18 98.4 F (36.9 C) (Oral)  08/07/18 98 F (36.7 C)   BP Readings from Last 3 Encounters:  11/05/18 129/74  10/30/18 136/82  08/07/18 (!) 169/95   Pulse Readings from Last 3 Encounters:  11/05/18 70  10/30/18 74  08/07/18 62   Pain Assessment Pain Score: 2  Pain Loc: Back/10  Unable to assess due to telephone consult visit format.  KPS = 100  100 - Normal; no complaints; no evidence of disease. 90   - Able to carry on normal activity; minor signs or symptoms of disease. 80   - Normal activity with effort; some signs or symptoms of disease. 40   - Cares for self; unable to carry on normal activity or to do active work. 60   - Requires occasional assistance, but is able to care for most of his personal needs. 50   - Requires considerable assistance and frequent medical care. 35   - Disabled; requires special care and assistance. 25   - Severely disabled; hospital admission is indicated although death not imminent. 48   - Very  sick; hospital admission necessary; active supportive treatment necessary. 10   - Moribund; fatal processes progressing rapidly. 0     - Dead  Karnofsky DA, Abelmann [place], Craver LS and [facility]chenal [facility] [phone]) The use of the nitrogen mustards in the palliative treatment of carcinoma: with particular reference to bronchogenic carcinoma Cancer 1 634-56  LABORATORY DATA:  Lab Results  Component Value Date   WBC 6.0 10/30/2018   HGB 11.3 (L) 11/05/2018   HCT 36.4 (L) 11/05/2018   MCV 87.3 10/30/2018   PLT 257 10/30/2018   Lab Results  Component Value Date   NA 138 11/05/2018   K 3.6 11/05/2018   CL 104 11/05/2018   CO2 25 11/05/2018   Lab Results  Component Value Date   ALT 53 05/25/2011   AST 27 05/25/2011   ALKPHOS 39 05/25/2011   BILITOT 0.6 05/25/2011     RADIOGRAPHY: No results found.    IMPRESSION/PLAN: 1. 59 y.o. gentleman with rising PSA s/p RALP in 10/2018 for stage pT3a, pN0 Gleason 4+5 with EPE and a positive margin. Today we reviewed the findings and workup thus far.  We discussed the natural history of prostate cancer.  We reviewed the the implications of positive margins, extracapsular extension, and seminal vesicle involvement on the risk of prostate cancer recurrence, particularly in light of a detectable postoperative PSA. We reviewed some of the evidence suggesting an advantage for patients who undergo adjuvant radiotherapy in the setting in terms of disease control and overall survival. We also discussed some of the dilemmas related to the available evidence.  We discussed the SWOG trial which did show an improvement in disease-free survival as well as overall survival using adjuvant radiotherapy. However, we discussed the fact that the study did not carefully control the usage of adjuvant radiotherapy in the observation arm. There is increasing evidence that careful surveillance with ultrasensitive PSA may provide an opportunity for early salvage in patients who undergo  observation, which can lead to excellent results in terms of disease control and survival. We discussed radiation treatment directed to the prostatic fossa with regard to the logistics and delivery of external beam  radiation treatment.  We also discussed the use of ADT in this case. In his situation, given his very low PSA, we discussed that this may cause further side effects that impact his quality of life for minimal benefit.  He was encouraged to ask questions that were answered to his stated satisfaction.  At the end of the conversation, the patient would like to proceed with 7.5 weeks of salvage radiation therapy to the prostate fossa without the use of ADT. We will share our discussion and recommendations with Dr. Gloriann Loan. He has provided verbal consent to proceed and will be scheduled for CT simulation next Friday, 05/02/2019, at 8:30 am in anticipation of beginning his daily treatments in the near future.  We will plan to sign formal written consent at the time of his CT simulation/treatment planning and a copy of this document will be placed in his medical record.  We enjoyed meeting with him again today and look forward to continuing to participate in his care.  He knows to call at anytime with any questions or concerns in the interim.  Given current concerns for patient exposure during the COVID-19 pandemic, this encounter was conducted via telephone. The patient was notified in advance and was offered a MyChart meeting to allow for face to face communication but unfortunately reported that he did not have the appropriate resources/technology to support such a visit and instead preferred to proceed with telephone consult. The patient has given verbal consent for this type of encounter. The time spent during this encounter was 30 minutes. The attendants for this meeting include Tyler Pita MD,   PA-C, [place], and patient. Ernestina Penna. During the encounter, Tyler Pita MD,   PA-C, and scribe, Wilburn Mylar were located at [place].  Patient, Raymond [name] was located at home.    Nicholos Johns, PA-C    Tyler Pita, MD  [facility] Direct Dial: [phone]  Fax: [phone] [email or web address]  Skype  LinkedIn  This document serves as a record of services personally performed by Tyler Pita, MD and Freeman Caldron, PA-C. It was created on their behalf by Wilburn Mylar, a trained medical scribe. The creation of this record is based on the scribe's personal observations and the provider's statements to them. This document has been checked and approved by the attending provider.     References:  JAMA. 2006 Nov 15;296(19):2329-35.  Adjuvant radiotherapy for pathologically advanced prostate cancer: a randomized clinical trial.  Grandville Silos IM Jr(1), Tangen CM, Hettie Holstein MS, Ova Freshwater, Messing Wayland Denis ED.  Author information: (1)Department of Urology, [facility] at [place], [place], [address], [place] [address], [place]. [email or web address]  CONTEXT: Despite a stage-shift to earlier cancer stages and lower tumor volumes for prostate cancer, pathologically advanced disease is detected at radical prostatectomy in 38% to 52% of patients. However, the optimal management of these patients after radical prostatectomy is unknown.  OBJECTIVE: To determine whether adjuvant radiotherapy improves metastasis-free survival in patients with stage pT3 N0 M0 prostate cancer.  DESIGN, SETTING, AND PATIENTS: Randomized, prospective, multi-institutional, [place] clinical trial with enrollment between August 24, 1986, and January 10, 1995 (with database frozen for statistical analysis on September 30, 2003). Patients were 1 men with pathologically advanced prostate cancer  who had undergone radical prostatectomy. INTERVENTION: Men were randomly assigned to receive 60 to 64 Gy  of external beam radiotherapy delivered to the prostatic fossa (n = 214) or usual care plus observation (n = 211).  MAIN OUTCOME MEASURES: Primary outcome was metastasis-free survival, defined as time to first occurrence of metastatic disease or death due to any cause. Secondary outcomes included prostate-specific antigen (PSA) relapse, recurrence-free survival, overall survival, freedom from hormonal therapy, and postoperative complications.  RESULTS: Among the 425 men, median follow-up was 10.6 years (interquartile range, 9.2-12.7 years). For metastasis-free survival, 76 (35.5%) of 214 men in the adjuvant radiotherapy group were diagnosed with metastatic disease or died (median metastasis-free estimate, 14.7 years), compared with 91 (43.1%) of 211 (median metastasis-free estimate, 13.2 years) of those in the observation group (hazard ratio [HR], 0.75; 95% CI, 0.55-1.02; P = .06). There were no significant between-group differences for overall survival (71 deaths, median survival of 14.7 years for radiotherapy vs 83 deaths, median survival of 13.8 years for observation; HR, 0.80; 95% CI, 0.58-1.09; P = .16). PSA relapse (median PSA relapse-free survival, 10.3 years for radiotherapy vs 3.1 years for observation; HR, 0.43; 95% CI, 0.31-0.58; P<.001) and disease recurrence (median recurrence-free survival, 13.8 years for radiotherapy vs 9.9 years for observation; HR, 0.62; 95% CI, 0.46-0.82; P = .001) were both significantly reduced with radiotherapy. Adverse effects were more common with radiotherapy vs observation (23.8% vs 11.9%), including rectal complications (1.8% vs 0%), urethral strictures (17.8% vs 9.5%), and total urinary incontinence (6.5% vs 2.8%).  CONCLUSIONS: In men who had undergone radical prostatectomy for pathologically advanced prostate cancer, adjuvant radiotherapy resulted in significantly  reduced risk of PSA relapse and disease recurrence, although the improvements in metastasis-free survival and overall survival were not statistically significant.  Trial Registration clinicaltrials.gov Identifier: [id number]. PMID: [id number]   J Clin Oncol. 2007 Jun 1;25(16):2225-9. Predominant treatment failure in postprostatectomy patients is local: analysis of patterns of treatment failure in SWOG 8794.  Swanson GP(1), [place] MA, Tangen CM, Chin J, Messing E, Canby-Hagino Daiva Eves, Doy Hutching ED;  Author information: (1)Department of Radiation Oncology and Urology, [facility], [place] [address], [place]. [email or web address] Comment in J Clin Oncol. 2007 Dec 10;25(35):5671-2.  PURPOSE: [facility] (SWOG) trial [phone] demonstrated that adjuvant radiation reduces the risk of biochemical (prostate-specific antigen [PSA]) treatment failure by 50% over radical prostatectomy alone. In this analysis, we stratified patients as to their preradiation PSA levels and correlated it with outcomes such as PSA treatment failure, local recurrence, and distant failure, to serve as guidelines for future research.  PATIENTS AND METHODS: Four hundred thirty-one subjects with pathologically advanced prostate cancer (extraprostatic extension, positive surgical margins, or seminal vesicle invasion) were randomly assigned to adjuvant radiotherapy or observation.  RESULTS: Three hundred seventy-four eligible patients had immediate postprostatectomy and follow-up PSA data. Median follow-up was 10.2 years. For patients with a postsurgical PSA of 0.2 ng/mL, radiation was associated with reductions in the 10-year risk of biochemical treatment failure (72% to 42%), local failures (20% to 7%), and distant failures (12% to 4%). For patients with a postsurgical PSA between higher than 0.2 and <or = 1.0 ng/mL, reductions in the 10-year risk of biochemical failure (80% to 73%),  local failures (25% to 9%), and distant failures (16% to 12%) were realized. In patients with postsurgical PSA higher than 1.0, the respective findings were 94% versus 100%, 28% versus 9%, and 44% versus 18%.  CONCLUSION: The pattern of treatment failure in high-risk patients is predominantly local with a surprisingly low incidence of metastatic failure. Adjuvant radiation to  the prostate bed reduces the risk of metastatic disease and biochemical failure at all postsurgical PSA levels. Further improvement in reducing local treatment failure is likely to have the greatest impact on outcome in high-risk patients after prostatectomy.  PMID: [id number]     J Urol. 2009 Mar;181(3):956-62.  Adjuvant radiotherapy for pathological T3N0M0 prostate cancer significantly reduces risk of metastases and improves survival: long-term followup of a randomized clinical trial.  Grandville Silos IM(1), Tangen CM, Hettie Holstein MS, Ova Freshwater, Messing Wayland Denis ED.  Chief Strategy Officer information: (1)[employer] at [place], [place], [place], [place]. PURPOSE: Extraprostatic disease will be manifest in a third of men after radical prostatectomy. We present the long-term followup of a randomized clinical trial of radiotherapy to reduce the risk of subsequent metastatic disease and death.  MATERIALS AND METHODS: A total of 431 men with pT3N0M0 prostate cancer were randomized to 60 to 64 Gy adjuvant radiotherapy or observation. The primary study end point was metastasis-free survival.  RESULTS: Of 425 eligible men 211 were randomized to observation and 214 to adjuvant radiation. Of those men under observation 70 ultimately received radiotherapy. Metastasis-free survival was significantly greater with radiotherapy (93 of 214 events on the radiotherapy arm vs 114 of 211 events on observation; HR 0.71; 95% CI 0.54, 0.94; p = 0.016). Survival improved significantly  with adjuvant radiation (88 deaths of 214 on the radiotherapy arm vs 110 deaths of 211 on observation; HR 0.72; 95% CI 0.55, 0.96; p = 0.023).  CONCLUSIONS: Adjuvant radiotherapy after radical prostatectomy for a man with pT3N0M0 prostate cancer significantly reduces the risk of metastasis and increases survival.  PMCID: [id number] PMID: [id number]

## 2019-04-25 NOTE — Patient Instructions (Signed)
Coronavirus (COVID-19) Are you at risk?  Are you at risk for the Coronavirus (COVID-19)?  To be considered HIGH RISK for Coronavirus (COVID-19), you have to meet the following criteria:  . Traveled to [place], [place], [place], [place] or [place]; or in the [place] to [place], [place], [place], or [place]; and have fever, cough, and shortness of breath within the last 2 weeks of travel OR . Been in close contact with a person diagnosed with COVID-19 within the last 2 weeks and have fever, cough, and shortness of breath . IF YOU DO NOT MEET THESE CRITERIA, YOU ARE CONSIDERED LOW RISK FOR COVID-19.  What to do if you are HIGH RISK for COVID-19?  . If you are having a medical emergency, call 911. . Seek medical care right away. Before you go to a doctor's office, urgent care or emergency department, call ahead and tell them about your recent travel, contact with someone diagnosed with COVID-19, and your symptoms. You should receive instructions from your physician's office regarding next steps of care.  . When you arrive at healthcare provider, tell the healthcare staff immediately you have returned from visiting [place], [place], [place], [place] or [place]; or traveled in the [place] to [place], [place], [place], or [place]; in the last two weeks or you have been in close contact with a person diagnosed with COVID-19 in the last 2 weeks.   . Tell the health care staff about your symptoms: fever, cough and shortness of breath. . After you have been seen by a medical provider, you will be either: o Tested for (COVID-19) and discharged home on quarantine except to seek medical care if symptoms worsen, and asked to  - Stay home and avoid contact with others until you get your results (4-5 days)  - Avoid travel on public transportation if possible (such as bus, train, or airplane) or o Sent to the Emergency Department by EMS for evaluation, COVID-19 testing, and possible  admission depending on your condition and test results.  What to do if you are LOW RISK for COVID-19?  Reduce your risk of any infection by using the same precautions used for avoiding the common cold or flu:  . Wash your hands often with soap and warm water for at least 20 seconds.  If soap and water are not readily available, use an alcohol-based hand sanitizer with at least 60% alcohol.  . If coughing or sneezing, cover your mouth and nose by coughing or sneezing into the elbow areas of your shirt or coat, into a tissue or into your sleeve (not your hands). . Avoid shaking hands with others and consider head nods or verbal greetings only. . Avoid touching your eyes, nose, or mouth with unwashed hands.  . Avoid close contact with people who are sick. . Avoid places or events with large numbers of people in one location, like concerts or sporting events. . Carefully consider travel plans you have or are making. . If you are planning any travel outside or inside the [place], visit the CDC's Travelers' Health webpage for the latest health notices. . If you have some symptoms but not all symptoms, continue to monitor at home and seek medical attention if your symptoms worsen. . If you are having a medical emergency, call 911.   ADDITIONAL HEALTHCARE OPTIONS FOR PATIENTS  [facility] / e-Visit: [email or web address]         [facility]: [phone]  [facility]: [phone]                   [facility]: [phone]   

## 2019-04-25 NOTE — Progress Notes (Signed)
Spoke with patient via phone. His AUA score is 6 it is much better since he had surgery. He uses viagra for erection and is only have some difficulty with sexual intercourse.

## 2019-05-02 ENCOUNTER — Encounter: Payer: Self-pay | Admitting: Medical Oncology

## 2019-05-02 ENCOUNTER — Ambulatory Visit
Admission: RE | Admit: 2019-05-02 | Discharge: 2019-05-02 | Disposition: A | Discharge status: Home / Self Care | Payer: Medicaid Other | Source: Ambulatory Visit | Attending: Radiation Oncology | Admitting: Radiation Oncology

## 2019-05-02 ENCOUNTER — Other Ambulatory Visit: Payer: Self-pay

## 2019-05-02 DIAGNOSIS — C61 Malignant neoplasm of prostate: Secondary | ICD-10-CM | POA: Insufficient documentation

## 2019-05-02 NOTE — Progress Notes (Signed)
  Radiation Oncology         [phone] ________________________________  Name: Raymond [name] MRN: [id number]  Date: 05/02/2019  DOB: 05-05-1960  SIMULATION AND TREATMENT PLANNING NOTE    ICD-10-CM   1. Prostate cancer ([place])  C61     DIAGNOSIS:  59 y.o. gentleman with Stage T1c adenocarcinoma of the prostate with Gleason score of 4+3, and PSA of 42.6.  NARRATIVE:  The patient was brought to the [facility].  Identity was confirmed.  All relevant records and images related to the planned course of therapy were reviewed.  The patient freely provided informed written consent to proceed with treatment after reviewing the details related to the planned course of therapy. The consent form was witnessed and verified by the simulation staff.  Then, the patient was set-up in a stable reproducible supine position for radiation therapy.  A vacuum lock pillow device was custom fabricated to position his legs in a reproducible immobilized position.  Then, I performed a urethrogram under sterile conditions to identify the prostatic bed.  CT images were obtained.  Surface markings were placed.  The CT images were loaded into the planning software.  Then the prostate bed target, pelvic lymph node target and avoidance structures including the rectum, bladder, bowel and hips were contoured.  Treatment planning then occurred.  The radiation prescription was entered and confirmed.  A total of one complex treatment devices were fabricated. I have requested : Intensity Modulated Radiotherapy (IMRT) is medically necessary for this case for the following reason:  Rectal sparing.Marland Kitchen  PLAN:  The patient will receive 45 Gy in 25 fractions of 1.8 Gy, followed by a boost to the prostate bed to a total dose of 68.4 Gy with 13 additional fractions of 1.8 Gy.   ________________________________  Sheral Apley Tammi Klippel, M.D.

## 2019-05-08 DIAGNOSIS — C61 Malignant neoplasm of prostate: Secondary | ICD-10-CM | POA: Diagnosis not present

## 2019-05-14 ENCOUNTER — Ambulatory Visit
Admission: RE | Admit: 2019-05-14 | Discharge: 2019-05-14 | Disposition: A | Discharge status: Home / Self Care | Payer: Medicaid Other | Source: Ambulatory Visit | Attending: Radiation Oncology | Admitting: Radiation Oncology

## 2019-05-14 ENCOUNTER — Other Ambulatory Visit: Payer: Self-pay

## 2019-05-14 DIAGNOSIS — C61 Malignant neoplasm of prostate: Secondary | ICD-10-CM | POA: Insufficient documentation

## 2019-05-15 ENCOUNTER — Other Ambulatory Visit: Payer: Self-pay

## 2019-05-15 ENCOUNTER — Encounter: Payer: Self-pay | Admitting: Medical Oncology

## 2019-05-15 ENCOUNTER — Ambulatory Visit
Admission: RE | Admit: 2019-05-15 | Discharge: 2019-05-15 | Disposition: A | Discharge status: Home / Self Care | Payer: Medicaid Other | Source: Ambulatory Visit | Attending: Radiation Oncology | Admitting: Radiation Oncology

## 2019-05-15 DIAGNOSIS — C61 Malignant neoplasm of prostate: Secondary | ICD-10-CM | POA: Diagnosis not present

## 2019-05-16 ENCOUNTER — Other Ambulatory Visit: Payer: Self-pay

## 2019-05-16 ENCOUNTER — Ambulatory Visit
Admission: RE | Admit: 2019-05-16 | Discharge: 2019-05-16 | Disposition: A | Discharge status: Home / Self Care | Payer: Medicaid Other | Source: Ambulatory Visit | Attending: Radiation Oncology | Admitting: Radiation Oncology

## 2019-05-16 DIAGNOSIS — C61 Malignant neoplasm of prostate: Secondary | ICD-10-CM | POA: Diagnosis not present

## 2019-05-19 ENCOUNTER — Ambulatory Visit
Admission: RE | Admit: 2019-05-19 | Discharge: 2019-05-19 | Disposition: A | Discharge status: Home / Self Care | Payer: Medicaid Other | Source: Ambulatory Visit | Attending: Radiation Oncology | Admitting: Radiation Oncology

## 2019-05-19 ENCOUNTER — Other Ambulatory Visit: Payer: Self-pay

## 2019-05-19 DIAGNOSIS — C61 Malignant neoplasm of prostate: Secondary | ICD-10-CM | POA: Diagnosis not present

## 2019-05-20 ENCOUNTER — Other Ambulatory Visit: Payer: Self-pay

## 2019-05-20 ENCOUNTER — Ambulatory Visit
Admission: RE | Admit: 2019-05-20 | Discharge: 2019-05-20 | Disposition: A | Discharge status: Home / Self Care | Payer: Medicaid Other | Source: Ambulatory Visit | Attending: Radiation Oncology | Admitting: Radiation Oncology

## 2019-05-20 DIAGNOSIS — C61 Malignant neoplasm of prostate: Secondary | ICD-10-CM | POA: Diagnosis not present

## 2019-05-21 ENCOUNTER — Other Ambulatory Visit: Payer: Self-pay

## 2019-05-21 ENCOUNTER — Ambulatory Visit
Admission: RE | Admit: 2019-05-21 | Discharge: 2019-05-21 | Disposition: A | Discharge status: Home / Self Care | Payer: Medicaid Other | Source: Ambulatory Visit | Attending: Radiation Oncology | Admitting: Radiation Oncology

## 2019-05-21 DIAGNOSIS — C61 Malignant neoplasm of prostate: Secondary | ICD-10-CM | POA: Diagnosis not present

## 2019-05-22 ENCOUNTER — Other Ambulatory Visit: Payer: Self-pay

## 2019-05-22 ENCOUNTER — Ambulatory Visit
Admission: RE | Admit: 2019-05-22 | Discharge: 2019-05-22 | Disposition: A | Discharge status: Home / Self Care | Payer: Medicaid Other | Source: Ambulatory Visit | Attending: Radiation Oncology | Admitting: Radiation Oncology

## 2019-05-22 DIAGNOSIS — C61 Malignant neoplasm of prostate: Secondary | ICD-10-CM | POA: Diagnosis not present

## 2019-05-23 ENCOUNTER — Ambulatory Visit
Admission: RE | Admit: 2019-05-23 | Discharge: 2019-05-23 | Disposition: A | Discharge status: Home / Self Care | Payer: Medicaid Other | Source: Ambulatory Visit | Attending: Radiation Oncology | Admitting: Radiation Oncology

## 2019-05-23 ENCOUNTER — Other Ambulatory Visit: Payer: Self-pay

## 2019-05-23 DIAGNOSIS — C61 Malignant neoplasm of prostate: Secondary | ICD-10-CM | POA: Diagnosis not present

## 2019-05-26 ENCOUNTER — Other Ambulatory Visit: Payer: Self-pay

## 2019-05-26 ENCOUNTER — Ambulatory Visit
Admission: RE | Admit: 2019-05-26 | Discharge: 2019-05-26 | Disposition: A | Discharge status: Home / Self Care | Payer: Medicaid Other | Source: Ambulatory Visit | Attending: Radiation Oncology | Admitting: Radiation Oncology

## 2019-05-26 DIAGNOSIS — C61 Malignant neoplasm of prostate: Secondary | ICD-10-CM | POA: Diagnosis not present

## 2019-05-27 ENCOUNTER — Ambulatory Visit
Admission: RE | Admit: 2019-05-27 | Discharge: 2019-05-27 | Disposition: A | Discharge status: Home / Self Care | Payer: Medicaid Other | Source: Ambulatory Visit | Attending: Radiation Oncology | Admitting: Radiation Oncology

## 2019-05-27 ENCOUNTER — Other Ambulatory Visit: Payer: Self-pay

## 2019-05-27 DIAGNOSIS — C61 Malignant neoplasm of prostate: Secondary | ICD-10-CM | POA: Diagnosis not present

## 2019-05-28 ENCOUNTER — Other Ambulatory Visit: Payer: Self-pay

## 2019-05-28 ENCOUNTER — Ambulatory Visit
Admission: RE | Admit: 2019-05-28 | Discharge: 2019-05-28 | Disposition: A | Discharge status: Home / Self Care | Payer: Medicaid Other | Source: Ambulatory Visit | Attending: Radiation Oncology | Admitting: Radiation Oncology

## 2019-05-28 DIAGNOSIS — C61 Malignant neoplasm of prostate: Secondary | ICD-10-CM | POA: Diagnosis not present

## 2019-05-29 ENCOUNTER — Ambulatory Visit
Admission: RE | Admit: 2019-05-29 | Discharge: 2019-05-29 | Disposition: A | Discharge status: Home / Self Care | Payer: Medicaid Other | Source: Ambulatory Visit | Attending: Radiation Oncology | Admitting: Radiation Oncology

## 2019-05-29 ENCOUNTER — Other Ambulatory Visit: Payer: Self-pay

## 2019-05-29 DIAGNOSIS — C61 Malignant neoplasm of prostate: Secondary | ICD-10-CM | POA: Diagnosis not present

## 2019-05-30 ENCOUNTER — Ambulatory Visit
Admission: RE | Admit: 2019-05-30 | Discharge: 2019-05-30 | Disposition: A | Discharge status: Home / Self Care | Payer: Medicaid Other | Source: Ambulatory Visit | Attending: Radiation Oncology | Admitting: Radiation Oncology

## 2019-05-30 ENCOUNTER — Other Ambulatory Visit: Payer: Self-pay

## 2019-05-30 DIAGNOSIS — C61 Malignant neoplasm of prostate: Secondary | ICD-10-CM | POA: Diagnosis not present

## 2019-06-02 ENCOUNTER — Other Ambulatory Visit: Payer: Self-pay

## 2019-06-02 ENCOUNTER — Ambulatory Visit
Admission: RE | Admit: 2019-06-02 | Discharge: 2019-06-02 | Disposition: A | Discharge status: Home / Self Care | Payer: Medicaid Other | Source: Ambulatory Visit | Attending: Radiation Oncology | Admitting: Radiation Oncology

## 2019-06-02 DIAGNOSIS — C61 Malignant neoplasm of prostate: Secondary | ICD-10-CM | POA: Diagnosis not present

## 2019-06-03 ENCOUNTER — Other Ambulatory Visit: Payer: Self-pay

## 2019-06-03 ENCOUNTER — Ambulatory Visit
Admission: RE | Admit: 2019-06-03 | Discharge: 2019-06-03 | Disposition: A | Discharge status: Home / Self Care | Payer: Medicaid Other | Source: Ambulatory Visit | Attending: Radiation Oncology | Admitting: Radiation Oncology

## 2019-06-03 DIAGNOSIS — C61 Malignant neoplasm of prostate: Secondary | ICD-10-CM | POA: Diagnosis not present

## 2019-06-04 ENCOUNTER — Other Ambulatory Visit: Payer: Self-pay

## 2019-06-04 ENCOUNTER — Ambulatory Visit
Admission: RE | Admit: 2019-06-04 | Discharge: 2019-06-04 | Disposition: A | Discharge status: Home / Self Care | Payer: Medicaid Other | Source: Ambulatory Visit | Attending: Radiation Oncology | Admitting: Radiation Oncology

## 2019-06-04 DIAGNOSIS — C61 Malignant neoplasm of prostate: Secondary | ICD-10-CM | POA: Diagnosis not present

## 2019-06-05 ENCOUNTER — Ambulatory Visit
Admission: RE | Admit: 2019-06-05 | Discharge: 2019-06-05 | Disposition: A | Discharge status: Home / Self Care | Payer: Medicaid Other | Source: Ambulatory Visit | Attending: Radiation Oncology | Admitting: Radiation Oncology

## 2019-06-05 ENCOUNTER — Other Ambulatory Visit: Payer: Self-pay

## 2019-06-05 DIAGNOSIS — C61 Malignant neoplasm of prostate: Secondary | ICD-10-CM | POA: Diagnosis not present

## 2019-06-06 ENCOUNTER — Other Ambulatory Visit: Payer: Self-pay

## 2019-06-06 ENCOUNTER — Ambulatory Visit
Admission: RE | Admit: 2019-06-06 | Discharge: 2019-06-06 | Disposition: A | Discharge status: Home / Self Care | Payer: Medicaid Other | Source: Ambulatory Visit | Attending: Radiation Oncology | Admitting: Radiation Oncology

## 2019-06-06 DIAGNOSIS — C61 Malignant neoplasm of prostate: Secondary | ICD-10-CM | POA: Diagnosis not present

## 2019-06-10 ENCOUNTER — Ambulatory Visit
Admission: RE | Admit: 2019-06-10 | Discharge: 2019-06-10 | Disposition: A | Discharge status: Home / Self Care | Payer: Medicaid Other | Source: Ambulatory Visit | Attending: Radiation Oncology | Admitting: Radiation Oncology

## 2019-06-10 ENCOUNTER — Other Ambulatory Visit: Payer: Self-pay

## 2019-06-10 DIAGNOSIS — C61 Malignant neoplasm of prostate: Secondary | ICD-10-CM | POA: Diagnosis not present

## 2019-06-11 ENCOUNTER — Ambulatory Visit
Admission: RE | Admit: 2019-06-11 | Discharge: 2019-06-11 | Disposition: A | Discharge status: Home / Self Care | Payer: Medicaid Other | Source: Ambulatory Visit | Attending: Radiation Oncology | Admitting: Radiation Oncology

## 2019-06-11 ENCOUNTER — Other Ambulatory Visit: Payer: Self-pay

## 2019-06-11 DIAGNOSIS — C61 Malignant neoplasm of prostate: Secondary | ICD-10-CM | POA: Diagnosis not present

## 2019-06-12 ENCOUNTER — Ambulatory Visit
Admission: RE | Admit: 2019-06-12 | Discharge: 2019-06-12 | Disposition: A | Discharge status: Home / Self Care | Payer: Medicaid Other | Source: Ambulatory Visit | Attending: Radiation Oncology | Admitting: Radiation Oncology

## 2019-06-12 ENCOUNTER — Other Ambulatory Visit: Payer: Self-pay

## 2019-06-12 DIAGNOSIS — C61 Malignant neoplasm of prostate: Secondary | ICD-10-CM | POA: Diagnosis not present

## 2019-06-13 ENCOUNTER — Ambulatory Visit
Admission: RE | Admit: 2019-06-13 | Discharge: 2019-06-13 | Disposition: A | Discharge status: Home / Self Care | Payer: Medicaid Other | Source: Ambulatory Visit | Attending: Radiation Oncology | Admitting: Radiation Oncology

## 2019-06-13 ENCOUNTER — Other Ambulatory Visit: Payer: Self-pay

## 2019-06-14 ENCOUNTER — Other Ambulatory Visit: Payer: Self-pay

## 2019-06-14 ENCOUNTER — Encounter (HOSPITAL_COMMUNITY)
Admission: EM | Disposition: A | Discharge status: Home / Self Care | Payer: Self-pay | Source: Home / Self Care | Attending: Pulmonary Disease

## 2019-06-14 ENCOUNTER — Emergency Department (HOSPITAL_COMMUNITY): Payer: Medicaid Other

## 2019-06-14 ENCOUNTER — Inpatient Hospital Stay (HOSPITAL_COMMUNITY)
Admission: EM | Admit: 2019-06-14 | Discharge: 2019-06-20 | DRG: 246 | Disposition: A | Discharge status: Home / Self Care | Payer: Medicaid Other | Attending: Student | Admitting: Student

## 2019-06-14 DIAGNOSIS — I25119 Atherosclerotic heart disease of native coronary artery with unspecified angina pectoris: Secondary | ICD-10-CM | POA: Diagnosis not present

## 2019-06-14 DIAGNOSIS — R310 Gross hematuria: Secondary | ICD-10-CM | POA: Diagnosis present

## 2019-06-14 DIAGNOSIS — R739 Hyperglycemia, unspecified: Secondary | ICD-10-CM | POA: Diagnosis present

## 2019-06-14 DIAGNOSIS — I10 Essential (primary) hypertension: Secondary | ICD-10-CM | POA: Diagnosis present

## 2019-06-14 DIAGNOSIS — R0603 Acute respiratory distress: Secondary | ICD-10-CM

## 2019-06-14 DIAGNOSIS — N365 Urethral false passage: Secondary | ICD-10-CM | POA: Diagnosis present

## 2019-06-14 DIAGNOSIS — I214 Non-ST elevation (NSTEMI) myocardial infarction: Secondary | ICD-10-CM

## 2019-06-14 DIAGNOSIS — G931 Anoxic brain damage, not elsewhere classified: Secondary | ICD-10-CM | POA: Diagnosis present

## 2019-06-14 DIAGNOSIS — E872 Acidosis: Secondary | ICD-10-CM | POA: Diagnosis present

## 2019-06-14 DIAGNOSIS — C61 Malignant neoplasm of prostate: Secondary | ICD-10-CM | POA: Diagnosis present

## 2019-06-14 DIAGNOSIS — J9600 Acute respiratory failure, unspecified whether with hypoxia or hypercapnia: Secondary | ICD-10-CM | POA: Diagnosis not present

## 2019-06-14 DIAGNOSIS — J9601 Acute respiratory failure with hypoxia: Secondary | ICD-10-CM | POA: Diagnosis not present

## 2019-06-14 DIAGNOSIS — R570 Cardiogenic shock: Secondary | ICD-10-CM | POA: Diagnosis present

## 2019-06-14 DIAGNOSIS — Z452 Encounter for adjustment and management of vascular access device: Secondary | ICD-10-CM

## 2019-06-14 DIAGNOSIS — I2510 Atherosclerotic heart disease of native coronary artery without angina pectoris: Secondary | ICD-10-CM | POA: Diagnosis present

## 2019-06-14 DIAGNOSIS — S3730XA Unspecified injury of urethra, initial encounter: Secondary | ICD-10-CM | POA: Diagnosis not present

## 2019-06-14 DIAGNOSIS — E875 Hyperkalemia: Secondary | ICD-10-CM | POA: Diagnosis present

## 2019-06-14 DIAGNOSIS — R569 Unspecified convulsions: Secondary | ICD-10-CM | POA: Diagnosis present

## 2019-06-14 DIAGNOSIS — J9602 Acute respiratory failure with hypercapnia: Secondary | ICD-10-CM | POA: Diagnosis not present

## 2019-06-14 DIAGNOSIS — I2121 ST elevation (STEMI) myocardial infarction involving left circumflex coronary artery: Secondary | ICD-10-CM

## 2019-06-14 DIAGNOSIS — J690 Pneumonitis due to inhalation of food and vomit: Secondary | ICD-10-CM | POA: Diagnosis not present

## 2019-06-14 DIAGNOSIS — N320 Bladder-neck obstruction: Secondary | ICD-10-CM | POA: Diagnosis present

## 2019-06-14 DIAGNOSIS — E876 Hypokalemia: Secondary | ICD-10-CM

## 2019-06-14 DIAGNOSIS — N179 Acute kidney failure, unspecified: Secondary | ICD-10-CM

## 2019-06-14 DIAGNOSIS — Z20822 Contact with and (suspected) exposure to covid-19: Secondary | ICD-10-CM | POA: Diagnosis present

## 2019-06-14 DIAGNOSIS — R319 Hematuria, unspecified: Secondary | ICD-10-CM | POA: Diagnosis not present

## 2019-06-14 DIAGNOSIS — R5381 Other malaise: Secondary | ICD-10-CM | POA: Diagnosis not present

## 2019-06-14 DIAGNOSIS — I469 Cardiac arrest, cause unspecified: Secondary | ICD-10-CM

## 2019-06-14 DIAGNOSIS — Z8546 Personal history of malignant neoplasm of prostate: Secondary | ICD-10-CM | POA: Diagnosis not present

## 2019-06-14 DIAGNOSIS — R338 Other retention of urine: Secondary | ICD-10-CM | POA: Diagnosis not present

## 2019-06-14 DIAGNOSIS — I462 Cardiac arrest due to underlying cardiac condition: Secondary | ICD-10-CM | POA: Diagnosis present

## 2019-06-14 DIAGNOSIS — Z955 Presence of coronary angioplasty implant and graft: Secondary | ICD-10-CM

## 2019-06-14 DIAGNOSIS — Z8673 Personal history of transient ischemic attack (TIA), and cerebral infarction without residual deficits: Secondary | ICD-10-CM

## 2019-06-14 DIAGNOSIS — I4901 Ventricular fibrillation: Secondary | ICD-10-CM | POA: Diagnosis present

## 2019-06-14 DIAGNOSIS — Z9911 Dependence on respirator [ventilator] status: Secondary | ICD-10-CM | POA: Diagnosis not present

## 2019-06-14 HISTORY — PX: CORONARY/GRAFT ACUTE MI REVASCULARIZATION: CATH118305

## 2019-06-14 HISTORY — PX: CORONARY STENT INTERVENTION: CATH118234

## 2019-06-14 HISTORY — PX: LEFT HEART CATH AND CORONARY ANGIOGRAPHY: CATH118249

## 2019-06-14 HISTORY — DX: Cardiac arrest, cause unspecified: I46.9

## 2019-06-14 LAB — CBC WITH DIFFERENTIAL/PLATELET
Abs Immature Granulocytes: 0.67 10*3/uL — ABNORMAL HIGH (ref 0.00–0.07)
Band Neutrophils: 1 %
Basophils Absolute: 0 10*3/uL (ref 0.0–0.1)
Basophils Relative: 0 %
Blasts: 0 %
Eosinophils Absolute: 0 10*3/uL (ref 0.0–0.5)
Eosinophils Relative: 0 %
HCT: 46.5 % (ref 39.0–52.0)
Hemoglobin: 14.4 g/dL (ref 13.0–17.0)
Lymphocytes Relative: 9 %
Lymphs Abs: 1 10*3/uL (ref 0.7–4.0)
MCH: 27.2 pg (ref 26.0–34.0)
MCHC: 31 g/dL (ref 30.0–36.0)
MCV: 87.7 fL (ref 80.0–100.0)
Metamyelocytes Relative: 3 %
Monocytes Absolute: 1.4 10*3/uL — ABNORMAL HIGH (ref 0.1–1.0)
Monocytes Relative: 13 %
Myelocytes: 3 %
Neutro Abs: 8 10*3/uL — ABNORMAL HIGH (ref 1.7–7.7)
Neutrophils Relative %: 71 %
Other: 0 %
Platelets: 180 10*3/uL (ref 150–400)
Promyelocytes Relative: 0 %
RBC: 5.3 MIL/uL (ref 4.22–5.81)
RDW: 15.3 % (ref 11.5–15.5)
WBC Morphology: INCREASED
WBC: 11.1 10*3/uL — ABNORMAL HIGH (ref 4.0–10.5)
nRBC: 0 % (ref 0.0–0.2)
nRBC: 0 /100 WBC

## 2019-06-14 LAB — I-STAT ARTERIAL BLOOD GAS, ED
Acid-base deficit: 5 mmol/L — ABNORMAL HIGH (ref 0.0–2.0)
Bicarbonate: 23.3 mmol/L (ref 20.0–28.0)
Calcium, Ion: 1.16 mmol/L (ref 1.15–1.40)
HCT: 45 % (ref 39.0–52.0)
Hemoglobin: 15.3 g/dL (ref 13.0–17.0)
O2 Saturation: 97 %
Patient temperature: 98.6
Potassium: 2.4 mmol/L — CL (ref 3.5–5.1)
Sodium: 140 mmol/L (ref 135–145)
TCO2: 25 mmol/L (ref 22–32)
pCO2 arterial: 56.6 mmHg — ABNORMAL HIGH (ref 32.0–48.0)
pH, Arterial: 7.222 — ABNORMAL LOW (ref 7.350–7.450)
pO2, Arterial: 113 mmHg — ABNORMAL HIGH (ref 83.0–108.0)

## 2019-06-14 LAB — URINALYSIS, ROUTINE W REFLEX MICROSCOPIC
Bilirubin Urine: NEGATIVE
Glucose, UA: >=500 mg/dL — AB
Ketones, ur: NEGATIVE mg/dL
Leukocytes,Ua: NEGATIVE
Nitrite: NEGATIVE
Protein, ur: >=300 mg/dL — AB
RBC / HPF: >50 RBC/hpf — ABNORMAL HIGH (ref 0–5)
Specific Gravity, Urine: 1.011 (ref 1.005–1.030)
pH: 8 (ref 5.0–8.0)

## 2019-06-14 LAB — COMPREHENSIVE METABOLIC PANEL
ALT: 104 U/L — ABNORMAL HIGH (ref 0–44)
AST: 168 U/L — ABNORMAL HIGH (ref 15–41)
Albumin: 3.5 g/dL (ref 3.5–5.0)
Alkaline Phosphatase: 37 U/L — ABNORMAL LOW (ref 38–126)
Anion gap: 20 — ABNORMAL HIGH (ref 5–15)
BUN: 16 mg/dL (ref 6–20)
CO2: 18 mmol/L — ABNORMAL LOW (ref 22–32)
Calcium: 8.5 mg/dL — ABNORMAL LOW (ref 8.9–10.3)
Chloride: 100 mmol/L (ref 98–111)
Creatinine, Ser: 1.67 mg/dL — ABNORMAL HIGH (ref 0.61–1.24)
GFR calc Af Amer: 51 mL/min — ABNORMAL LOW (ref >60)
GFR calc non Af Amer: 44 mL/min — ABNORMAL LOW (ref >60)
Glucose, Bld: 294 mg/dL — ABNORMAL HIGH (ref 70–99)
Potassium: 2.5 mmol/L — CL (ref 3.5–5.1)
Sodium: 138 mmol/L (ref 135–145)
Total Bilirubin: 0.6 mg/dL (ref 0.3–1.2)
Total Protein: 6.7 g/dL (ref 6.5–8.1)

## 2019-06-14 LAB — I-STAT CHEM 8, ED
BUN: 19 mg/dL (ref 6–20)
Calcium, Ion: 1.07 mmol/L — ABNORMAL LOW (ref 1.15–1.40)
Chloride: 101 mmol/L (ref 98–111)
Creatinine, Ser: 2 mg/dL — ABNORMAL HIGH (ref 0.61–1.24)
Glucose, Bld: 330 mg/dL — ABNORMAL HIGH (ref 70–99)
HCT: 45 % (ref 39.0–52.0)
Hemoglobin: 15.3 g/dL (ref 13.0–17.0)
Potassium: 2.9 mmol/L — ABNORMAL LOW (ref 3.5–5.1)
Sodium: 138 mmol/L (ref 135–145)
TCO2: 18 mmol/L — ABNORMAL LOW (ref 22–32)

## 2019-06-14 LAB — RAPID URINE DRUG SCREEN, HOSP PERFORMED
Amphetamines: NOT DETECTED
Barbiturates: NOT DETECTED
Benzodiazepines: NOT DETECTED
Cocaine: NOT DETECTED
Opiates: NOT DETECTED
Tetrahydrocannabinol: NOT DETECTED

## 2019-06-14 LAB — ETHANOL: Alcohol, Ethyl (B): <10 mg/dL (ref <10)

## 2019-06-14 LAB — GLUCOSE, CAPILLARY: Glucose-Capillary: 206 mg/dL — ABNORMAL HIGH (ref 70–99)

## 2019-06-14 LAB — BRAIN NATRIURETIC PEPTIDE: B Natriuretic Peptide: 40.2 pg/mL (ref 0.0–100.0)

## 2019-06-14 LAB — PROTIME-INR
INR: 1 (ref 0.8–1.2)
Prothrombin Time: 13.2 seconds (ref 11.4–15.2)

## 2019-06-14 LAB — TRIGLYCERIDES: Triglycerides: 349 mg/dL — ABNORMAL HIGH (ref <150)

## 2019-06-14 LAB — SARS CORONAVIRUS 2 BY RT PCR (HOSPITAL ORDER, PERFORMED IN ~~LOC~~ HOSPITAL LAB): SARS Coronavirus 2: NEGATIVE

## 2019-06-14 LAB — LACTIC ACID, PLASMA: Lactic Acid, Venous: 8.3 mmol/L (ref 0.5–1.9)

## 2019-06-14 LAB — TROPONIN I (HIGH SENSITIVITY): Troponin I (High Sensitivity): 1438 ng/L (ref <18)

## 2019-06-14 SURGERY — CORONARY/GRAFT ACUTE MI REVASCULARIZATION
Anesthesia: LOCAL

## 2019-06-14 MED ORDER — FENTANYL 2500MCG IN NS 250ML (10MCG/ML) PREMIX INFUSION
25.0000 ug/h | INTRAVENOUS | Status: DC
  Administered 2019-06-14: 50 ug/h via INTRAVENOUS
  Administered 2019-06-15: 200 ug/h via INTRAVENOUS
  Administered 2019-06-15: 100 ug/h via INTRAVENOUS
  Administered 2019-06-15: 200 ug/h via INTRAVENOUS
  Filled 2019-06-14 (×3): qty 250

## 2019-06-14 MED ORDER — TICAGRELOR 90 MG PO TABS
180.0000 mg | ORAL_TABLET | Freq: Once | ORAL | Status: AC
  Administered 2019-06-15: 180 mg
  Filled 2019-06-14: qty 2

## 2019-06-14 MED ORDER — BIVALIRUDIN TRIFLUOROACETATE 250 MG IV SOLR
INTRAVENOUS | Status: AC
  Filled 2019-06-14: qty 250

## 2019-06-14 MED ORDER — IOHEXOL 350 MG/ML SOLN
INTRAVENOUS | Status: AC
  Filled 2019-06-14: qty 1

## 2019-06-14 MED ORDER — PANTOPRAZOLE SODIUM 40 MG IV SOLR
40.0000 mg | Freq: Every day | INTRAVENOUS | Status: DC
  Administered 2019-06-15: 40 mg via INTRAVENOUS
  Filled 2019-06-14: qty 40

## 2019-06-14 MED ORDER — SODIUM CHLORIDE 0.9 % IV SOLN
4.0000 ug/kg/min | INTRAVENOUS | Status: DC
  Filled 2019-06-14: qty 50

## 2019-06-14 MED ORDER — SODIUM CHLORIDE 0.9 % IV SOLN
INTRAVENOUS | Status: AC | PRN
  Administered 2019-06-14: 4 ug/kg/min via INTRAVENOUS

## 2019-06-14 MED ORDER — POLYETHYLENE GLYCOL 3350 17 G PO PACK: 17.0000 g | PACK | Freq: Every day | ORAL | Status: DC | PRN

## 2019-06-14 MED ORDER — LIDOCAINE HCL (PF) 1 % IJ SOLN
INTRAMUSCULAR | Status: DC | PRN
  Administered 2019-06-14: 10 mL

## 2019-06-14 MED ORDER — DOCUSATE SODIUM 100 MG PO CAPS: 100.0000 mg | ORAL_CAPSULE | Freq: Two times a day (BID) | ORAL | Status: DC | PRN

## 2019-06-14 MED ORDER — LABETALOL HCL 5 MG/ML IV SOLN: 10.0000 mg | INTRAVENOUS | Status: AC | PRN

## 2019-06-14 MED ORDER — HEPARIN (PORCINE) IN NACL 1000-0.9 UT/500ML-% IV SOLN
INTRAVENOUS | Status: AC
  Filled 2019-06-14: qty 1000

## 2019-06-14 MED ORDER — FENTANYL BOLUS VIA INFUSION
INTRAVENOUS | Status: DC | PRN
  Administered 2019-06-14: 150 ug via INTRAVENOUS

## 2019-06-14 MED ORDER — SODIUM CHLORIDE 0.9% FLUSH
3.0000 mL | Freq: Two times a day (BID) | INTRAVENOUS | Status: DC
  Administered 2019-06-16 – 2019-06-19 (×6): 3 mL via INTRAVENOUS

## 2019-06-14 MED ORDER — SODIUM CHLORIDE 0.9 % IV SOLN: 1.7500 mg/kg/h | INTRAVENOUS | Status: DC

## 2019-06-14 MED ORDER — ETOMIDATE 2 MG/ML IV SOLN
INTRAVENOUS | Status: AC | PRN
  Administered 2019-06-14: 20 mg via INTRAVENOUS

## 2019-06-14 MED ORDER — ASPIRIN 81 MG PO CHEW: 81.0000 mg | CHEWABLE_TABLET | Freq: Every day | ORAL | Status: DC

## 2019-06-14 MED ORDER — PROPOFOL BOLUS VIA INFUSION
40.0000 mg | Freq: Once | INTRAVENOUS | Status: AC
  Administered 2019-06-14: 40 mg via INTRAVENOUS
  Filled 2019-06-14: qty 40

## 2019-06-14 MED ORDER — MIDAZOLAM HCL 2 MG/2ML IJ SOLN
INTRAMUSCULAR | Status: AC
  Filled 2019-06-14: qty 2

## 2019-06-14 MED ORDER — HYDRALAZINE HCL 20 MG/ML IJ SOLN: 10.0000 mg | INTRAMUSCULAR | Status: AC | PRN

## 2019-06-14 MED ORDER — NOREPINEPHRINE BITARTRATE 1 MG/ML IV SOLN
INTRAVENOUS | Status: DC | PRN
  Administered 2019-06-14: 3 ug/min via INTRAVENOUS

## 2019-06-14 MED ORDER — LIDOCAINE HCL (PF) 1 % IJ SOLN
INTRAMUSCULAR | Status: AC
  Filled 2019-06-14: qty 30

## 2019-06-14 MED ORDER — FENTANYL BOLUS VIA INFUSION
50.0000 ug | INTRAVENOUS | Status: DC | PRN
  Administered 2019-06-15: 50 ug via INTRAVENOUS
  Filled 2019-06-14: qty 50

## 2019-06-14 MED ORDER — AMIODARONE HCL IN DEXTROSE 360-4.14 MG/200ML-% IV SOLN
60.0000 mg/h | INTRAVENOUS | Status: DC
  Administered 2019-06-14: 60 mg/h via INTRAVENOUS
  Filled 2019-06-14: qty 200
  Filled 2019-06-14: qty 400

## 2019-06-14 MED ORDER — SODIUM CHLORIDE 0.9 % IV BOLUS: 500.0000 mL | Freq: Once | INTRAVENOUS | Status: DC

## 2019-06-14 MED ORDER — POTASSIUM CHLORIDE 10 MEQ/100ML IV SOLN
10.0000 meq | Freq: Once | INTRAVENOUS | Status: AC
  Administered 2019-06-14: 10 meq via INTRAVENOUS
  Filled 2019-06-14: qty 100

## 2019-06-14 MED ORDER — PROPOFOL 1000 MG/100ML IV EMUL
0.0000 ug/kg/min | INTRAVENOUS | Status: DC
  Administered 2019-06-14: 20 ug/kg/min via INTRAVENOUS
  Administered 2019-06-15: 30 ug/kg/min via INTRAVENOUS
  Administered 2019-06-15: 20 ug/kg/min via INTRAVENOUS
  Administered 2019-06-15: 30 ug/kg/min via INTRAVENOUS
  Administered 2019-06-16: 35 ug/kg/min via INTRAVENOUS
  Filled 2019-06-14 (×4): qty 100
  Filled 2019-06-14: qty 200

## 2019-06-14 MED ORDER — FENTANYL CITRATE (PF) 100 MCG/2ML IJ SOLN: 50.0000 ug | Freq: Once | INTRAMUSCULAR | Status: DC

## 2019-06-14 MED ORDER — NITROGLYCERIN 1 MG/10 ML FOR IR/CATH LAB
INTRA_ARTERIAL | Status: AC
  Filled 2019-06-14: qty 10

## 2019-06-14 MED ORDER — IOHEXOL 350 MG/ML SOLN
INTRAVENOUS | Status: DC | PRN
  Administered 2019-06-14: 135 mL

## 2019-06-14 MED ORDER — ATORVASTATIN CALCIUM 80 MG PO TABS: 80.0000 mg | ORAL_TABLET | Freq: Every day | ORAL | Status: DC

## 2019-06-14 MED ORDER — ACETAMINOPHEN 325 MG PO TABS: 650.0000 mg | ORAL_TABLET | ORAL | Status: DC | PRN

## 2019-06-14 MED ORDER — CANGRELOR BOLUS VIA INFUSION
INTRAVENOUS | Status: DC | PRN
  Administered 2019-06-14: 2448 ug via INTRAVENOUS

## 2019-06-14 MED ORDER — ROCURONIUM BROMIDE 50 MG/5ML IV SOLN
INTRAVENOUS | Status: AC | PRN
  Administered 2019-06-14: 50 mg via INTRAVENOUS

## 2019-06-14 MED ORDER — SODIUM CHLORIDE 0.9 % IV SOLN
175.0000 mL/h | INTRAVENOUS | Status: DC
  Administered 2019-06-14 – 2019-06-15 (×2): 175 mL/h via INTRAVENOUS

## 2019-06-14 MED ORDER — SODIUM CHLORIDE 0.9 % IV SOLN: INTRAVENOUS | Status: DC

## 2019-06-14 MED ORDER — SODIUM CHLORIDE 0.9% FLUSH: 3.0000 mL | INTRAVENOUS | Status: DC | PRN

## 2019-06-14 MED ORDER — ONDANSETRON HCL 4 MG/2ML IJ SOLN: 4.0000 mg | Freq: Four times a day (QID) | INTRAMUSCULAR | Status: DC | PRN

## 2019-06-14 MED ORDER — SODIUM CHLORIDE 0.9 % IV SOLN: 250.0000 mL | INTRAVENOUS | Status: DC | PRN

## 2019-06-14 MED ORDER — HEPARIN (PORCINE) IN NACL 1000-0.9 UT/500ML-% IV SOLN
INTRAVENOUS | Status: DC | PRN
  Administered 2019-06-14 (×3): 500 mL

## 2019-06-14 MED ORDER — SODIUM CHLORIDE 0.9 % IV SOLN
INTRAVENOUS | Status: AC | PRN
  Administered 2019-06-14 (×2): 1.75 mg/kg/h via INTRAVENOUS

## 2019-06-14 MED ORDER — BIVALIRUDIN BOLUS VIA INFUSION - CUPID
INTRAVENOUS | Status: DC | PRN
  Administered 2019-06-14: 61.2 mg via INTRAVENOUS

## 2019-06-14 MED ORDER — ASPIRIN 300 MG RE SUPP: 300.0000 mg | RECTAL | Status: AC

## 2019-06-14 MED ORDER — CANGRELOR TETRASODIUM 50 MG IV SOLR
INTRAVENOUS | Status: AC
  Filled 2019-06-14: qty 50

## 2019-06-14 MED ORDER — MIDAZOLAM HCL 2 MG/2ML IJ SOLN
INTRAMUSCULAR | Status: DC | PRN
  Administered 2019-06-14: 2 mg via INTRAVENOUS

## 2019-06-14 MED ORDER — NITROGLYCERIN 1 MG/10 ML FOR IR/CATH LAB
INTRA_ARTERIAL | Status: DC | PRN
  Administered 2019-06-14: 100 ug

## 2019-06-14 MED ORDER — TICAGRELOR 90 MG PO TABS
90.0000 mg | ORAL_TABLET | Freq: Two times a day (BID) | ORAL | Status: DC
  Administered 2019-06-15: 90 mg via ORAL
  Filled 2019-06-14: qty 1

## 2019-06-14 MED ORDER — AMIODARONE HCL IN DEXTROSE 360-4.14 MG/200ML-% IV SOLN
30.0000 mg/h | INTRAVENOUS | Status: DC
  Administered 2019-06-15 – 2019-06-17 (×5): 30 mg/h via INTRAVENOUS
  Filled 2019-06-14 (×3): qty 200

## 2019-06-14 MED ORDER — FENTANYL BOLUS VIA INFUSION
INTRAVENOUS | Status: DC | PRN
  Administered 2019-06-14: 50 ug via INTRAVENOUS

## 2019-06-14 MED ORDER — NOREPINEPHRINE 4 MG/250ML-% IV SOLN
0.0000 ug/min | INTRAVENOUS | Status: DC
  Administered 2019-06-15: 2 ug/min via INTRAVENOUS
  Administered 2019-06-15: 30 ug/min via INTRAVENOUS
  Administered 2019-06-15: 3 ug/min via INTRAVENOUS
  Filled 2019-06-14 (×2): qty 250

## 2019-06-14 MED ORDER — POTASSIUM CHLORIDE 10 MEQ/100ML IV SOLN
10.0000 meq | INTRAVENOUS | Status: DC
  Administered 2019-06-15 (×2): 10 meq via INTRAVENOUS
  Filled 2019-06-14: qty 100

## 2019-06-14 SURGICAL SUPPLY — 15 items
BALLN SAPPHIRE 2.5X12 (BALLOONS) ×2
BALLN SAPPHIRE ~~LOC~~ 3.5X12 (BALLOONS) ×1 IMPLANT
BALLOON SAPPHIRE 2.5X12 (BALLOONS) IMPLANT
CATH INFINITI 5FR MULTPACK ANG (CATHETERS) ×1 IMPLANT
CATH VISTA GUIDE 6FR XB3.5 (CATHETERS) ×1 IMPLANT
HOVERMATT SINGLE USE (MISCELLANEOUS) ×1 IMPLANT
KIT ENCORE 26 ADVANTAGE (KITS) ×1 IMPLANT
KIT HEART LEFT (KITS) ×2 IMPLANT
PACK CARDIAC CATHETERIZATION (CUSTOM PROCEDURE TRAY) ×2 IMPLANT
SHEATH PINNACLE 6F 10CM (SHEATH) ×1 IMPLANT
STENT RESOLUTE ONYX 3.5X15 (Permanent Stent) ×1 IMPLANT
TRANSDUCER W/STOPCOCK (MISCELLANEOUS) ×2 IMPLANT
TUBING CIL FLEX 10 FLL-RA (TUBING) ×2 IMPLANT
WIRE COUGAR XT STRL 190CM (WIRE) ×1 IMPLANT
WIRE EMERALD 3MM-J .035X150CM (WIRE) ×1 IMPLANT

## 2019-06-14 NOTE — Consult Note (Signed)
Cardiology Consultation:   Patient ID: Raymond [name] MRN: [id number]; DOB: January 08, 1961  Admit date: 06/14/2019 Date of Consult: 06/14/2019  Primary Care Provider: Gevena Mart, DO Primary Cardiologist: No primary care provider on file.  Primary Electrophysiologist:  None    Patient Profile:   Raymond [name] is a 59 y.o. male with a hx of stage III prostate cancer (currently receiving XRT), HTN, HLD, remote substance abuse who is being seen today for the evaluation of cardiac arrest.  History of Present Illness:  History is obtained from EMS and from the patient's daughters, who are at the bedside.   Mr. Poucher was at home earlier today when he had a witnessed cardiac arrest. This was witnessed by his wife, who initially thought he was having a seizure, although he has no history of seizures. Per their report, he had drank 1 beer earlier today but otherwise was feeling well and had no acute complaints. They do not believe he had used any illicit substances. The patient's wife called EMS who found him to be in VF on arrival. ACLS was initiated and ROSC was achieved after multiple rounds of epinephrine, amiodarone, defibrillation, and lidocaine. His post-ROSC ECG did not reveal overt ST elevations.   He was brought to the [facility] ED and underwent RSI in the ED due to poor neurologic status. He was not observed to be making any purposeful movements. An ECG was performed and was without clear ST elevations. Cardiology was called for consultation.   Patient's ECG on arrival to the ED revealed subtle changes suggestive of ischemia but no overt ST elevations. A bedside echo revealed grossly normal RV and LV function without significant pericardial effusion. Given that there was no other identifiable cause for his cardiac arrest, the patient was brought to the cath lab for emergent angiography.   Heart Pathway Score:     No past medical history on file.    Home Medications:  Prior to Admission  medications   Not on File    Inpatient Medications: Scheduled Meds: . [START ON 06/15/2019] aspirin  81 mg Oral Daily  . aspirin  300 mg Rectal NOW  . [START ON 06/15/2019] atorvastatin  80 mg Oral Daily  . fentaNYL (SUBLIMAZE) injection  50 mcg Intravenous Once  . pantoprazole (PROTONIX) IV  40 mg Intravenous QHS  . [START ON 06/15/2019] sodium chloride flush  3 mL Intravenous Q12H  . ticagrelor  180 mg Per Tube Once  . [START ON 06/15/2019] ticagrelor  90 mg Oral BID   Continuous Infusions: . sodium chloride    . [START ON 06/15/2019] sodium chloride    . amiodarone 60 mg/hr (06/14/19 1947)  . [START ON 06/15/2019] amiodarone    . cangrelor 50 mg in NS 250 mL    . fentaNYL infusion INTRAVENOUS 100 mcg/hr (06/14/19 1957)  . norepinephrine (LEVOPHED) Adult infusion    . potassium chloride    . propofol (DIPRIVAN) infusion 20 mcg/kg/min (06/14/19 2001)  . sodium chloride 175 mL/hr at 06/14/19 2227   PRN Meds: [START ON 06/15/2019] sodium chloride, acetaminophen, docusate sodium, fentaNYL, hydrALAZINE, labetalol, ondansetron (ZOFRAN) IV, polyethylene glycol, [START ON 06/15/2019] sodium chloride flush  Allergies:   Not on File  Social History:   Social History   Socioeconomic History  . Marital status: Single    Spouse name: Not on file  . Number of children: Not on file  . Years of education: Not on file  . Highest education level: Not on file  Occupational  History  . Not on file  Tobacco Use  . Smoking status: Not on file  Substance and Sexual Activity  . Alcohol use: Not on file  . Drug use: Not on file  . Sexual activity: Not on file  Other Topics Concern  . Not on file  Social History Narrative  . Not on file   Social Determinants of Health   Financial Resource Strain:   . Difficulty of Paying Living Expenses:   Food Insecurity:   . Worried About Charity fundraiser in the Last Year:   . Arboriculturist in the Last Year:   Transportation Needs:   . Lexicographer (Medical):   Marland Kitchen Lack of Transportation (Non-Medical):   Physical Activity:   . Days of Exercise per Week:   . Minutes of Exercise per Session:   Stress:   . Feeling of Stress :   Social Connections:   . Frequency of Communication with Friends and Family:   . Frequency of Social Gatherings with Friends and Family:   . Attends Religious Services:   . Active Member of Clubs or Organizations:   . Attends Archivist Meetings:   Marland Kitchen Marital Status:   Intimate Partner Violence:   . Fear of Current or Ex-Partner:   . Emotionally Abused:   Marland Kitchen Physically Abused:   . Sexually Abused:     Family History:   No family history on file.   ROS:  Please see the history of present illness.   Unable to obtain ROS due to intubation  Physical Exam/Data:   Vitals:   06/14/19 2206 06/14/19 2211 06/14/19 2216 06/14/19 2221  BP: 107/78 107/76 109/76 109/78  Pulse: 84 87 94 85  Resp: 15 13 15 16   Temp:      SpO2: 94% 96% 96% 97%  Weight:      Height:        Intake/Output Summary (Last 24 hours) at 06/14/2019 2254 Last data filed at 06/14/2019 2025 Gross per 24 hour  Intake --  Output 0 ml  Net 0 ml   Last 3 Weights 06/14/2019  Weight (lbs) 180 lb  Weight (kg) 81.647 kg     Body mass index is 24.41 kg/m.  General:  Well nourished, well developed, intubated, sedated HEENT: normal Neck: no JVD Endocrine:  No thryomegaly Vascular: No carotid bruits; FA pulses 2+ bilaterally without bruits  Cardiac:  normal S1, S2; RRR; no murmur Lungs:  Mechanical breath sounds with course rhonchi bilaterally Abd: soft, nontender, no hepatomegaly  Ext: no edema Musculoskeletal:  No deformities Skin: warm and dry  Neuro:  Unable to evaluate due to sedation  EKG:  The EKG was personally reviewed and demonstrates:  Sinus tachycardia with ST elevation in aVR and diffuse ST depression; prolonged QT  Relevant CV Studies: Cath 06/14/19  Post intervention, there is a 0% residual  stenosis.  Prox Cx to Mid Cx lesion is 85% stenosed.  A stent was successfully placed.   Out of hospital cardiac arrest most likely due to transient occlusion of the proximal circumflex with reperfusion and evidence for  85% proximal stenosis with TIMI-3 flow.  Normal LAD and right coronary artery.  Preserved global LV contractility with LVEDP 10 mmHg.  Successful PCI to the proximal circumflex vessel with PTCA and ultimate insertion of a 3.5 x 15 mm Resolute DES stent with the stenosis being reduced to 0%.  RECOMMENDATION: DAPT for minimum of a year.  Hypothermia protocol will  be instituted.  We will continue IV Cangrelor.  Initiate Brilinta via NG tube once the patient is in the CCU with discontinuance of Cangrelor 30 to 60 minutes post Brilinta administration.  We will continue bivalirudin for 2 hours post procedure.  2D echo Doppler study in a.m.  Laboratory Data:  High Sensitivity Troponin:   Recent Labs  Lab 06/14/19 1943  TROPONINIHS 1,438*     Chemistry Recent Labs  Lab 06/14/19 1941 06/14/19 1943 06/14/19 1951  NA 138 138 140  K 2.9* 2.5* 2.4*  CL 101 100  --   CO2  --  18*  --   GLUCOSE 330* 294*  --   BUN 19 16  --   CREATININE 2.00* 1.67*  --   CALCIUM  --  8.5*  --   GFRNONAA  --  44*  --   GFRAA  --  51*  --   ANIONGAP  --  20*  --     Recent Labs  Lab 06/14/19 1943  PROT 6.7  ALBUMIN 3.5  AST 168*  ALT 104*  ALKPHOS 37*  BILITOT 0.6   Hematology Recent Labs  Lab 06/14/19 1941 06/14/19 1943 06/14/19 1951  WBC  --  11.1*  --   RBC  --  5.30  --   HGB 15.3 14.4 15.3  HCT 45.0 46.5 45.0  MCV  --  87.7  --   MCH  --  27.2  --   MCHC  --  31.0  --   RDW  --  15.3  --   PLT  --  180  --    BNP Recent Labs  Lab 06/14/19 1943  BNP 40.2    DDimer No results for input(s): DDIMER in the last 168 hours.   Radiology/Studies:  CARDIAC CATHETERIZATION  Result Date: 06/14/2019  Post intervention, there is a 0% residual stenosis.   Prox Cx to Mid Cx lesion is 85% stenosed.  A stent was successfully placed.  Out of hospital cardiac arrest most likely due to transient occlusion of the proximal circumflex with reperfusion and evidence for  85% proximal stenosis with TIMI-3 flow. Normal LAD and right coronary artery. Preserved global LV contractility with LVEDP 10 mmHg. Successful PCI to the proximal circumflex vessel with PTCA and ultimate insertion of a 3.5 x 15 mm Resolute DES stent with the stenosis being reduced to 0%. RECOMMENDATION: DAPT for minimum of a year.  Hypothermia protocol will be instituted.  We will continue IV Cangrelor.  Initiate Brilinta via NG tube once the patient is in the CCU with discontinuance of Cangrelor 30 to 60 minutes post Brilinta administration.  We will continue bivalirudin for 2 hours post procedure.  2D echo Doppler study in a.m.   DG Chest Portable 1 View  Result Date: 06/14/2019 CLINICAL DATA:  Verify endotracheal and orogastric tube placement. EXAM: PORTABLE CHEST 1 VIEW COMPARISON:  None. FINDINGS: Endotracheal tube tip 4.2 cm from the carina. Enteric tube tip and side-port below the diaphragm in the stomach. Low lung volumes. Upper normal heart size. Vague right suprahilar opacity. No visualized pneumothorax or large pleural effusion. No acute osseous abnormalities are seen. IMPRESSION: 1. Endotracheal tube tip 4.2 cm from the carina. Enteric tube tip and side-port below the diaphragm in the stomach. 2. Low lung volumes. Vague right suprahilar opacity is nonspecific. Atelectasis, pneumonia, aspiration or possibility of pulmonary contusion if there is a history of trauma. Electronically Signed   By: Keith Rake M.D.   On:  06/14/2019 19:46       TIMI Risk Score for Unstable Angina or Non-ST Elevation MI:   The patient's TIMI risk score is 2, which indicates a 8% risk of all cause mortality, new or recurrent myocardial infarction or need for urgent revascularization in the next 14 days.    Assessment and Plan:  Hamdi Kley is a 59 y.o. male with a hx of stage III prostate cancer (currently receiving XRT), HTN, HLD, remote substance abuse who is being seen today for the evaluation of cardiac arrest. Found to have 1 circumflex lesion in the cath lab of about 80% occlusion, now s/p PCI.   #) Cardiac arrest: likely obstructive circumflex lesion found on coronary angiography. Fortunately his LV function appeared to be preserved by LV-gram. Patient also found to be severely hypokalemic on presentation with potassium of 2.4. It is possible that he had myocardial ischemia from his obstructive LCx lesion and, in the setting of hypokalemia, developed VT/VF arrest. He had no evidence of tamponade on bedside echo and his RV size and function appeared normal, making PE less likely. There are no other obvious causes for the patient's cardiac arrest. His family does not believe he used any illicit substances recently.  - agree with TTM for neurologic preservation - ASA 81mg  daily - ticagrelor 90mg  BID - if hemodynamics allow, start metoprolol tartrate 12.5mg  q6h - start atorvastatin 80mg  daily - cont amiodarone drip for 24 hours, then ok to d/c - obtain echo in AM - check lipids, A1c - check urine drug screen - aggressive repletion of K to >4 and Mg to >2 - normal LVEDP in cath lab and patient appears euvolemic on exam - strict I/O's, maintain euvolemia - recommend head CT scan - daily ECG to monitor QTc (as another potential cause of arrest) - respiratory and sedation management per CCM      For questions or updates, please contact [facility] Please consult [email or web address] for contact info under     Signed, Marcie Mowers, MD  06/14/2019 10:54 PM

## 2019-06-14 NOTE — ED Notes (Signed)
Code cool initiated

## 2019-06-14 NOTE — H&P (Addendum)
NAME:  Raymond [name], MRN:  [id number], DOB:  1960/12/04, LOS: 0 ADMISSION DATE:  06/14/2019, CONSULTATION DATE:  06/14/19 REFERRING MD:  EDP, CHIEF COMPLAINT:  Cardiac arrest   Brief History   59 year old male with prostate cancer currently undergoing chemotherapy who had seizure-like activity followed by V fib cardiac arrest. ROSC after defibrillation x5, epi x5 and total of 450 mg amiodarone and 150 mg lidocaine.  Intubated in the ED and PCCM consulted for admission.  History of present illness   Raymond [name] is a 59 year old male with a past medical history of prostate cancer currently undergoing chemotherapy, hypertension alcohol use, remote drug use who was in his usual state of health this morning.  They note he mowed his daughter's lawn and then drank a beer and laid down for a nap.  He got up and walked downstairs and about 20 minutes later sat down on the couch and was noted to be havingupper body seizure-like shaking and altered mental status.  His daughter states his respirations were sonorous and gradually got worse, she did not check a pulse or start CPR.  She believes EMS arrived less than 10 minutes later, he was in V fib and underwent approximately 35-40 minutes of ACLS. ROSC after defibrillation x5, epi x5 and total of 450 mg amiodarone and 150 mg lidocaine.  He was intubated in the ED with ROSC and etomidate, EKG showed diffuse ST depressions and cardiology was consulted.  Labs were significant of a potassium of 2.9, troponin 1,438, lactic acid 8.3, urine drug screen negative.  Family states he has no history of heart problems and has not been complaining of any chest pain or shortness of breath.  Duplicate chart: MRN [id number]   Past Medical History  Prostate cancer, hypertension, remote stroke, remote drug use  Significant Hospital Events   6/5 admit to PCCM, to Cath Lab  Consults:  Cardiology  Procedures:  6/5 ETT 6/5 cardiac cath  Significant Diagnostic Tests:  6/20  CXR>>Low lung volumes. Vague right suprahilar opacity is nonspecific. Atelectasis, pneumonia, aspiration or possibility of pulmonary contusion if there is a history of trauma.   Micro Data:  6/5 Covid-19>> pending  Antimicrobials:     Interim history/subjective:  Patient intubated and sedated in the ED, code cool initiated into Cath Lab in the intensive care unit  Objective   Blood pressure 104/69, pulse 85, temperature (!) 96.4 F (35.8 C), resp. rate 19, height 6' (1.829 m), weight 81.6 kg, SpO2 96 %.    Vent Mode: PRVC FiO2 (%):  [100 %] 100 % Set Rate:  [16 bmp] 16 bmp Vt Set:  [600 mL] 600 mL PEEP:  [5 cmH20] 5 cmH20   Intake/Output Summary (Last 24 hours) at 06/14/2019 2100 Last data filed at 06/14/2019 2025 Gross per 24 hour  Intake --  Output 0 ml  Net 0 ml   Filed Weights   06/14/19 1933  Weight: 81.6 kg    General: Well-nourished male, intubated and sedated HEENT: MM pink/moist, ET tube in place Neuro: Unresponsive on propofol, triggering breaths on the vent, no gag, pupils 2 mm and constricted but equal and responsive to light CV: s1s2 , no m/r/g PULM: Coarse breath sounds bilaterally, on full vent support GI: soft, bsx4 active  Extremities: warm/dry, no edema  Skin: no rashes or lesions   Resolved Hospital Problem list     Assessment & Plan:    Witnessed, out of hospital V. fib cardiac arrest With elevated troponin, no decreased cardiac  function or wall motion abnormalities per cardiology on POCUS.  Diffuse ST depressions on EKG -Plan is to the Cath Lab emergently -CT head -TTM to 36 degrees -Formal echo -ASA --Maintain full vent support with SAT/SBT as tolerated -titrate Vent setting to maintain SpO2 greater than or equal to 90%. -HOB elevated 30 degrees. -Plateau pressures less than 30 cm H20.  -Follow chest x-ray, ABG prn.   -Bronchial hygiene and RT/bronchodilator protocol.    Seizure-like activity Upper body shaking lasted a few  minutes per family, no prior history of seizures family does not believe there has been any metastatic spread of prostate cancer but no CT head in duplicate chart -Continue propofol, stat CT head -May need neurology consult, anti-epileptics and LTM EEG -Seizure precautions   AKI, hyperkalemia Baseline creatinine less than 1, potassium repleted with 40 mEq.  Hematuria noted on urinalysis -IV fluid bolus monitor renal indices and electrolytes, avoid nephrotoxins -Renal ultrasound   Prostate cancer Currently undergoing chemotherapy through [facility]  Best practice:  Diet: N.p.o. Pain/Anxiety/Delirium protocol (if indicated): Propofol/fentanyl VAP protocol (if indicated): HOB 30 degrees suction as needed DVT prophylaxis: SCDs GI prophylaxis: Protonix Glucose control: SSI Mobility: Bedrest Code Status: Full code Family Communication: Multiple family members updated at the bedside Disposition: ICU  Labs   CBC: Recent Labs  Lab 06/14/19 1941 06/14/19 1943 06/14/19 1951  WBC  --  11.1*  --   NEUTROABS  --  PENDING  --   HGB 15.3 14.4 15.3  HCT 45.0 46.5 45.0  MCV  --  87.7  --   PLT  --  180  --     Basic Metabolic Panel: Recent Labs  Lab 06/14/19 1941 06/14/19 1951  NA 138 140  K 2.9* 2.4*  CL 101  --   GLUCOSE 330*  --   BUN 19  --   CREATININE 2.00*  --    GFR: Estimated Creatinine Clearance: 43.7 mL/min (A) (by C-G formula based on SCr of 2 mg/dL (H)). Recent Labs  Lab 06/14/19 1943  WBC 11.1*    Liver Function Tests: No results for input(s): AST, ALT, ALKPHOS, BILITOT, PROT, ALBUMIN in the last 168 hours. No results for input(s): LIPASE, AMYLASE in the last 168 hours. No results for input(s): AMMONIA in the last 168 hours.  ABG    Component Value Date/Time   PHART 7.222 (L) 06/14/2019 1951   PCO2ART 56.6 (H) 06/14/2019 1951   PO2ART 113 (H) 06/14/2019 1951   HCO3 23.3 06/14/2019 1951   TCO2 25 06/14/2019 1951   ACIDBASEDEF 5.0  (H) 06/14/2019 1951   O2SAT 97.0 06/14/2019 1951     Coagulation Profile: Recent Labs  Lab 06/14/19 1943  INR 1.0    Cardiac Enzymes: No results for input(s): CKTOTAL, CKMB, CKMBINDEX, TROPONINI in the last 168 hours.  HbA1C: No results found for: HGBA1C  CBG: No results for input(s): GLUCAP in the last 168 hours.  Review of Systems:   Unable to obtain secondary to mental status  Past Medical History  He,  has no past medical history on file.   Surgical History      Social History      Family History   His family history is not on file.   Allergies Not on File   Home Medications  Prior to Admission medications   Not on File     Critical care time: 65 minutes    CRITICAL CARE Performed by: Otilio Carpen Devoiry Corriher   Total  critical care time: 65 minutes  Critical care time was exclusive of separately billable procedures and treating other patients.  Critical care was necessary to treat or prevent imminent or life-threatening deterioration.  Critical care was time spent personally by me on the following activities: development of treatment plan with patient and/or surrogate as well as nursing, discussions with consultants, evaluation of patient's response to treatment, examination of patient, obtaining history from patient or surrogate, ordering and performing treatments and interventions, ordering and review of laboratory studies, ordering and review of radiographic studies, pulse oximetry and re-evaluation of patient's condition.   Otilio Carpen Genieve Ramaswamy, PA-C

## 2019-06-14 NOTE — Progress Notes (Signed)
[facility] Progress Note Patient Name: Raymond [name] DOB: [birth year] MRN: [id number]   Date of Service  06/14/2019  HPI/Events of Note  [name] is back from Cath lab, he had a drug eluting stent placed in his Circ, he is somewhat wakeful but not explicitly following commands.  eICU Interventions  Bedside notified regarding instituting hypothermia protocol.        Kerry Kass Vivian Neuwirth 06/14/2019, 11:09 PM

## 2019-06-14 NOTE — ED Provider Notes (Signed)
[facility] EMERGENCY DEPARTMENT Provider Note   CSN: [id number] Arrival date & time: 06/14/19  1921     History Chief Complaint  Patient presents with  . Cardiac Arrest    Raymond [name] is a 59 y.o. male.  HPI Patient brought to the ED via EMS after witnessed cardiac arrest at home. Name is Raymond [name] (04/19/1959). Per EMS report he had sudden onset of seizure-like activity at home. When first responders arrived, he was pulseless. EMS reports he was in V-fib which was resistant to Amiodarone (total 450mg ), defibrillation x 5 or Epi x 5. Patient had ROSC after lidocaine 150mg . He arrived with [facility] Airway in place.   Pt with known history of prostate cancer and strokes per EMS. No known CAD.     No past medical history on file.  Patient Active Problem List   Diagnosis Date Noted  . Cardiac arrest ([place]) 06/14/2019  . AKI (acute kidney injury) ([place])   . Seizure ([place])        No family history on file.  Social History   Tobacco Use  . Smoking status: Not on file  Substance Use Topics  . Alcohol use: Not on file  . Drug use: Not on file    Home Medications Prior to Admission medications   Not on File    Allergies    Patient has no allergy information on record.  Review of Systems   Review of Systems  Unable to perform ROS: Intubated    Physical Exam Updated Vital Signs BP 104/69   Pulse 85   Temp (!) 96.4 F (35.8 C)   Resp 19   Ht 6' (1.829 m)   Wt 81.6 kg   SpO2 96%   BMI 24.41 kg/m   Physical Exam Vitals and nursing note reviewed.  Constitutional:      Appearance: He is diaphoretic.  HENT:     Head: Normocephalic and atraumatic.     Nose: Nose normal.     Mouth/Throat:     Mouth: Mucous membranes are moist.     Comments: Emesis around [facility] Airway Eyes:     Pupils: Pupils are equal, round, and reactive to light.  Cardiovascular:     Rate and Rhythm: Normal rate.  Pulmonary:     Comments: Spontaneous respirations prior to  RSI; rhonchi after intubation.  Abdominal:     General: Abdomen is flat.     Palpations: Abdomen is soft.  Musculoskeletal:        General: No swelling. Normal range of motion.     Cervical back: Neck supple.  Skin:    General: Skin is warm.  Neurological:     Comments: Unable to assess  Psychiatric:     Comments: Unable to assess     ED Results / Procedures / Treatments   Labs (all labs ordered are listed, but only abnormal results are displayed) Labs Reviewed  COMPREHENSIVE METABOLIC PANEL - Abnormal; Notable for the following components:      Result Value   Potassium 2.5 (*)    CO2 18 (*)    Glucose, Bld 294 (*)    Creatinine, Ser 1.67 (*)    Calcium 8.5 (*)    AST 168 (*)    ALT 104 (*)    Alkaline Phosphatase 37 (*)    GFR calc non Af Amer 44 (*)    GFR calc Af Amer 51 (*)    Anion gap 20 (*)    All other  components within normal limits  CBC WITH DIFFERENTIAL/PLATELET - Abnormal; Notable for the following components:   WBC 11.1 (*)    Neutro Abs 8.0 (*)    Monocytes Absolute 1.4 (*)    Abs Immature Granulocytes 0.67 (*)    All other components within normal limits  URINALYSIS, ROUTINE W REFLEX MICROSCOPIC - Abnormal; Notable for the following components:   APPearance CLOUDY (*)    Glucose, UA >=500 (*)    Hgb urine dipstick MODERATE (*)    Protein, ur >=300 (*)    RBC / HPF >50 (*)    Bacteria, UA RARE (*)    All other components within normal limits  LACTIC ACID, PLASMA - Abnormal; Notable for the following components:   Lactic Acid, Venous 8.3 (*)    All other components within normal limits  TRIGLYCERIDES - Abnormal; Notable for the following components:   Triglycerides 349 (*)    All other components within normal limits  I-STAT CHEM 8, ED - Abnormal; Notable for the following components:   Potassium 2.9 (*)    Creatinine, Ser 2.00 (*)    Glucose, Bld 330 (*)    Calcium, Ion 1.07 (*)    TCO2 18 (*)    All other components within normal limits    I-STAT ARTERIAL BLOOD GAS, ED - Abnormal; Notable for the following components:   pH, Arterial 7.222 (*)    pCO2 arterial 56.6 (*)    pO2, Arterial 113 (*)    Acid-base deficit 5.0 (*)    Potassium 2.4 (*)    All other components within normal limits  TROPONIN I (HIGH SENSITIVITY) - Abnormal; Notable for the following components:   Troponin I (High Sensitivity) 1,438 (*)    All other components within normal limits  SARS CORONAVIRUS 2 BY RT PCR (HOSPITAL ORDER, [facility])  ETHANOL  BRAIN NATRIURETIC PEPTIDE  PROTIME-INR  RAPID URINE DRUG SCREEN, HOSP PERFORMED  LACTIC ACID, PLASMA  HIV ANTIBODY (ROUTINE TESTING W REFLEX)  BASIC METABOLIC PANEL  BASIC METABOLIC PANEL  PROTIME-INR  APTT  BLOOD GAS, ARTERIAL  BLOOD GAS, ARTERIAL  CBC  BLOOD GAS, ARTERIAL  MAGNESIUM  PHOSPHORUS  URINALYSIS, ROUTINE W REFLEX MICROSCOPIC  RAPID URINE DRUG SCREEN, HOSP PERFORMED  MAGNESIUM  TROPONIN I (HIGH SENSITIVITY)    EKG None  Radiology DG Chest Portable 1 View  Result Date: 06/14/2019 CLINICAL DATA:  Verify endotracheal and orogastric tube placement. EXAM: PORTABLE CHEST 1 VIEW COMPARISON:  None. FINDINGS: Endotracheal tube tip 4.2 cm from the carina. Enteric tube tip and side-port below the diaphragm in the stomach. Low lung volumes. Upper normal heart size. Vague right suprahilar opacity. No visualized pneumothorax or large pleural effusion. No acute osseous abnormalities are seen. IMPRESSION: 1. Endotracheal tube tip 4.2 cm from the carina. Enteric tube tip and side-port below the diaphragm in the stomach. 2. Low lung volumes. Vague right suprahilar opacity is nonspecific. Atelectasis, pneumonia, aspiration or possibility of pulmonary contusion if there is a history of trauma. Electronically Signed   By: Keith Rake M.D.   On: 06/14/2019 19:46    Procedures Procedure Name: Intubation Date/Time: 06/14/2019 7:52 PM Performed by: Truddie Hidden,  MD Pre-anesthesia Checklist: Patient identified Oxygen Delivery Method: Ambu bag Preoxygenation: Pre-oxygenation with 100% oxygen Induction Type: Rapid sequence Ventilation: Mask ventilation without difficulty Laryngoscope Size: Glidescope and 4 Grade View: Grade I Tube type: Subglottic suction tube Tube size: 7.5 mm Number of attempts: 1     .Critical  Care Performed by: Truddie Hidden, MD Authorized by: Truddie Hidden, MD   Critical care provider statement:    Critical care time (minutes):  60   Critical care time was exclusive of:  Separately billable procedures and treating other patients   Critical care was necessary to treat or prevent imminent or life-threatening deterioration of the following conditions:  Cardiac failure, respiratory failure and renal failure   Critical care was time spent personally by me on the following activities:  Development of treatment plan with patient or surrogate, discussions with consultants, evaluation of patient's response to treatment, examination of patient, gastric intubation, ordering and performing treatments and interventions, ordering and review of laboratory studies, ordering and review of radiographic studies, pulse oximetry, re-evaluation of patient's condition and review of old charts   (including critical care time)  Medications Ordered in ED Medications  fentaNYL (SUBLIMAZE) injection 50 mcg ( Intravenous MAR Hold 06/14/19 2104)  fentaNYL 2526mcg in NS 288mL (61mcg/ml) infusion-PREMIX (100 mcg/hr Intravenous Rate/Dose Change 06/14/19 1957)  fentaNYL (SUBLIMAZE) bolus via infusion 50 mcg ( Intravenous MAR Hold 06/14/19 2104)  amiodarone (NEXTERONE PREMIX) 360-4.14 MG/200ML-% (1.8 mg/mL) IV infusion (60 mg/hr Intravenous New Bag/Given 06/14/19 1947)  amiodarone (NEXTERONE PREMIX) 360-4.14 MG/200ML-% (1.8 mg/mL) IV infusion (has no administration in time range)  propofol (DIPRIVAN) 1000 MG/100ML infusion (20 mcg/kg/min  81.6 kg  Intravenous New Bag/Given 06/14/19 2001)  docusate sodium (COLACE) capsule 100 mg ( Oral MAR Hold 06/14/19 2104)  polyethylene glycol (MIRALAX / GLYCOLAX) packet 17 g ( Oral MAR Hold 06/14/19 2104)  norepinephrine (LEVOPHED) 4mg  in 252mL premix infusion ( Intravenous MAR Hold 06/14/19 2104)  aspirin suppository 300 mg ( Rectal MAR Hold 06/14/19 2104)  sodium chloride 0.9 % bolus 500 mL ( Intravenous Rate/Dose Change 06/14/19 2148)  pantoprazole (PROTONIX) injection 40 mg ( Intravenous Automatically Held 06/22/19 2200)  potassium chloride 10 mEq in 100 mL IVPB ( Intravenous Automatically Held 06/15/19 0000)  lidocaine (PF) (XYLOCAINE) 1 % injection (10 mLs  Given 06/14/19 2116)  fentaNYL (SUBLIMAZE) bolus via infusion (50 mcg Intravenous Given 06/14/19 2121)  Heparin (Porcine) in NaCl 1000-0.9 UT/500ML-% SOLN (500 mLs  Given 06/14/19 2122)  bivalirudin (ANGIOMAX) BOLUS via infusion (61.2 mg Intravenous Given 06/14/19 2133)  bivalirudin (ANGIOMAX) 250 mg in sodium chloride 0.9 % 50 mL (5 mg/mL) infusion (1.75 mg/kg/hr  81.6 kg Intravenous New Bag/Given 06/14/19 2139)  cangrelor [facility]) bolus via infusion (2,448 mcg Intravenous Given 06/14/19 2142)  norepinephrine (LEVOPHED) 4 mg in dextrose 5 % 250 mL (0.016 mg/mL) infusion (4 mcg/min Intravenous Rate/Dose Change 06/14/19 2147)  cangrelor (KENGREAL) 50,000 mcg in sodium chloride 0.9 % 250 mL (200 mcg/mL) infusion (4 mcg/kg/min  81.6 kg Intravenous New Bag/Given 06/14/19 2145)  etomidate (AMIDATE) injection (20 mg Intravenous Given 06/14/19 1925)  rocuronium (ZEMURON) injection (50 mg Intravenous Given 06/14/19 1925)  propofol (DIPRIVAN) bolus via infusion 40 mg (40 mg Intravenous Bolus from Bag 06/14/19 2001)  potassium chloride 10 mEq in 100 mL IVPB (10 mEq Intravenous New Bag/Given 06/14/19 2038)    ED Course  I have reviewed the triage vital signs and the nursing notes.  Pertinent labs & imaging results that were available during my care of the patient were reviewed by me  and considered in my medical decision making (see chart for details).  Clinical Course as of Jun 14 2147  Sat Jun 14, 2019  2028 Spoke with family who confirm seizure-like activity prior to becoming unresponsive with agonal respirations. He has a history  of stroke and prostate cancer, no known metastases. Initial EKG does not show STEMI, but I have spoken to Cardiology who has evaluated the patient and will take him to Cath Lab to rule out occluded coronary artery as the cause of his arrest.    [CS]  2040 Potassium low on POC Chem. Repletion initiated. Also has elevated Creatinine, no known kidney disease per family.    [CS]    Clinical Course User Index [CS] Truddie Hidden, MD   MDM Rules/Calculators/A&P                       Final Clinical Impression(s) / ED Diagnoses Final diagnoses:  AKI (acute kidney injury) [facility])  Cardiac arrest ([place])  Hypokalemia    Rx / DC Orders ED Discharge Orders    None       Truddie Hidden, MD 06/14/19 2149

## 2019-06-14 NOTE — Procedures (Signed)
Intubation Procedure Note Raymond [name] [id number] 01/09/1875  Procedure: Intubation Indications: Respiratory insufficiency  Procedure Details Consent: Unable to obtain consent because of emergent medical necessity. Time Out: Verified patient identification, verified procedure, site/side was marked, verified correct patient position, special equipment/implants available, medications/allergies/relevent history reviewed, required imaging and test results available.  Performed  Maximum sterile technique was used including gloves, gown, hand hygiene and mask.  MAC    Evaluation Hemodynamic Status: BP stable throughout; O2 sats: stable throughout Patient's Current Condition: stable Complications: No apparent complications Patient did tolerate procedure well. Chest X-ray ordered to verify placement.  CXR: pending.   Chriss Driver [facility] 06/14/2019

## 2019-06-15 ENCOUNTER — Inpatient Hospital Stay (HOSPITAL_COMMUNITY): Payer: Medicaid Other | Admitting: Anesthesiology

## 2019-06-15 ENCOUNTER — Inpatient Hospital Stay (HOSPITAL_COMMUNITY): Payer: Medicaid Other

## 2019-06-15 ENCOUNTER — Encounter (HOSPITAL_COMMUNITY)
Admission: EM | Disposition: A | Discharge status: Home / Self Care | Payer: Self-pay | Source: Home / Self Care | Attending: Pulmonary Disease

## 2019-06-15 DIAGNOSIS — I469 Cardiac arrest, cause unspecified: Secondary | ICD-10-CM

## 2019-06-15 DIAGNOSIS — Z9911 Dependence on respirator [ventilator] status: Secondary | ICD-10-CM

## 2019-06-15 DIAGNOSIS — I25119 Atherosclerotic heart disease of native coronary artery with unspecified angina pectoris: Secondary | ICD-10-CM

## 2019-06-15 DIAGNOSIS — J9602 Acute respiratory failure with hypercapnia: Secondary | ICD-10-CM

## 2019-06-15 DIAGNOSIS — J9601 Acute respiratory failure with hypoxia: Secondary | ICD-10-CM

## 2019-06-15 DIAGNOSIS — E876 Hypokalemia: Secondary | ICD-10-CM

## 2019-06-15 DIAGNOSIS — N179 Acute kidney failure, unspecified: Secondary | ICD-10-CM

## 2019-06-15 HISTORY — PX: CYSTOSCOPY WITH URETHRAL DILATATION: SHX5125

## 2019-06-15 LAB — POCT I-STAT 7, (LYTES, BLD GAS, ICA,H+H)
Acid-base deficit: 3 mmol/L — ABNORMAL HIGH (ref 0.0–2.0)
Acid-base deficit: 5 mmol/L — ABNORMAL HIGH (ref 0.0–2.0)
Bicarbonate: 23.3 mmol/L (ref 20.0–28.0)
Bicarbonate: 21.6 mmol/L (ref 20.0–28.0)
Calcium, Ion: 1.14 mmol/L — ABNORMAL LOW (ref 1.15–1.40)
Calcium, Ion: 1.17 mmol/L (ref 1.15–1.40)
HCT: 41 % (ref 39.0–52.0)
HCT: 44 % (ref 39.0–52.0)
Hemoglobin: 13.9 g/dL (ref 13.0–17.0)
Hemoglobin: 15 g/dL (ref 13.0–17.0)
O2 Saturation: 100 %
O2 Saturation: 92 %
Patient temperature: 35.9
Patient temperature: 36.4
Potassium: 3.2 mmol/L — ABNORMAL LOW (ref 3.5–5.1)
Potassium: 3.3 mmol/L — ABNORMAL LOW (ref 3.5–5.1)
Sodium: 140 mmol/L (ref 135–145)
Sodium: 140 mmol/L (ref 135–145)
TCO2: 25 mmol/L (ref 22–32)
TCO2: 23 mmol/L (ref 22–32)
pCO2 arterial: 42.9 mmHg (ref 32.0–48.0)
pCO2 arterial: 45.7 mmHg (ref 32.0–48.0)
pH, Arterial: 7.337 — ABNORMAL LOW (ref 7.350–7.450)
pH, Arterial: 7.28 — ABNORMAL LOW (ref 7.350–7.450)
pO2, Arterial: 272 mmHg — ABNORMAL HIGH (ref 83.0–108.0)
pO2, Arterial: 71 mmHg — ABNORMAL LOW (ref 83.0–108.0)

## 2019-06-15 LAB — BASIC METABOLIC PANEL
Anion gap: 12 (ref 5–15)
Anion gap: 10 (ref 5–15)
Anion gap: 14 (ref 5–15)
BUN: 19 mg/dL (ref 6–20)
BUN: 20 mg/dL (ref 6–20)
BUN: 16 mg/dL (ref 6–20)
CO2: 20 mmol/L — ABNORMAL LOW (ref 22–32)
CO2: 19 mmol/L — ABNORMAL LOW (ref 22–32)
CO2: 17 mmol/L — ABNORMAL LOW (ref 22–32)
Calcium: 7.9 mg/dL — ABNORMAL LOW (ref 8.9–10.3)
Calcium: 7.6 mg/dL — ABNORMAL LOW (ref 8.9–10.3)
Calcium: 7.3 mg/dL — ABNORMAL LOW (ref 8.9–10.3)
Chloride: 107 mmol/L (ref 98–111)
Chloride: 107 mmol/L (ref 98–111)
Chloride: 108 mmol/L (ref 98–111)
Creatinine, Ser: 1.16 mg/dL (ref 0.61–1.24)
Creatinine, Ser: 1.28 mg/dL — ABNORMAL HIGH (ref 0.61–1.24)
Creatinine, Ser: 1.28 mg/dL — ABNORMAL HIGH (ref 0.61–1.24)
GFR calc Af Amer: >60 mL/min (ref >60)
GFR calc Af Amer: >60 mL/min (ref >60)
GFR calc Af Amer: >60 mL/min (ref >60)
GFR calc non Af Amer: >60 mL/min (ref >60)
GFR calc non Af Amer: >60 mL/min (ref >60)
GFR calc non Af Amer: >60 mL/min (ref >60)
Glucose, Bld: 143 mg/dL — ABNORMAL HIGH (ref 70–99)
Glucose, Bld: 168 mg/dL — ABNORMAL HIGH (ref 70–99)
Glucose, Bld: 129 mg/dL — ABNORMAL HIGH (ref 70–99)
Potassium: 3.3 mmol/L — ABNORMAL LOW (ref 3.5–5.1)
Potassium: 3.1 mmol/L — ABNORMAL LOW (ref 3.5–5.1)
Potassium: 4.2 mmol/L (ref 3.5–5.1)
Sodium: 139 mmol/L (ref 135–145)
Sodium: 136 mmol/L (ref 135–145)
Sodium: 139 mmol/L (ref 135–145)

## 2019-06-15 LAB — CBC
HCT: 43 % (ref 39.0–52.0)
Hemoglobin: 13.6 g/dL (ref 13.0–17.0)
MCH: 27.5 pg (ref 26.0–34.0)
MCHC: 31.6 g/dL (ref 30.0–36.0)
MCV: 86.9 fL (ref 80.0–100.0)
Platelets: 170 10*3/uL (ref 150–400)
RBC: 4.95 MIL/uL (ref 4.22–5.81)
RDW: 15.6 % — ABNORMAL HIGH (ref 11.5–15.5)
WBC: 8.3 10*3/uL (ref 4.0–10.5)
nRBC: 0 % (ref 0.0–0.2)

## 2019-06-15 LAB — TYPE AND SCREEN
ABO/RH(D): A NEG
Antibody Screen: NEGATIVE

## 2019-06-15 LAB — POCT ACTIVATED CLOTTING TIME: Activated Clotting Time: 147 seconds

## 2019-06-15 LAB — GLUCOSE, CAPILLARY
Glucose-Capillary: 151 mg/dL — ABNORMAL HIGH (ref 70–99)
Glucose-Capillary: 166 mg/dL — ABNORMAL HIGH (ref 70–99)
Glucose-Capillary: 159 mg/dL — ABNORMAL HIGH (ref 70–99)
Glucose-Capillary: 154 mg/dL — ABNORMAL HIGH (ref 70–99)
Glucose-Capillary: 115 mg/dL — ABNORMAL HIGH (ref 70–99)
Glucose-Capillary: 156 mg/dL — ABNORMAL HIGH (ref 70–99)
Glucose-Capillary: 164 mg/dL — ABNORMAL HIGH (ref 70–99)
Glucose-Capillary: 184 mg/dL — ABNORMAL HIGH (ref 70–99)

## 2019-06-15 LAB — ECHOCARDIOGRAM COMPLETE
Height: 69 in
Weight: 2878.33 oz

## 2019-06-15 LAB — ABO/RH: ABO/RH(D): A NEG

## 2019-06-15 LAB — MRSA PCR SCREENING: MRSA by PCR: NEGATIVE

## 2019-06-15 LAB — APTT
aPTT: 73 seconds — ABNORMAL HIGH (ref 24–36)
aPTT: 45 seconds — ABNORMAL HIGH (ref 24–36)

## 2019-06-15 LAB — PROTIME-INR
INR: 1.9 — ABNORMAL HIGH (ref 0.8–1.2)
INR: 1.1 (ref 0.8–1.2)
Prothrombin Time: 21.1 seconds — ABNORMAL HIGH (ref 11.4–15.2)
Prothrombin Time: 14 seconds (ref 11.4–15.2)

## 2019-06-15 LAB — MAGNESIUM
Magnesium: 2.7 mg/dL — ABNORMAL HIGH (ref 1.7–2.4)
Magnesium: 2.3 mg/dL (ref 1.7–2.4)

## 2019-06-15 LAB — HEMOGLOBIN AND HEMATOCRIT, BLOOD
HCT: 42.6 % (ref 39.0–52.0)
Hemoglobin: 13.3 g/dL (ref 13.0–17.0)

## 2019-06-15 LAB — PHOSPHORUS: Phosphorus: 3.2 mg/dL (ref 2.5–4.6)

## 2019-06-15 LAB — TROPONIN I (HIGH SENSITIVITY): Troponin I (High Sensitivity): >27000 ng/L (ref <18)

## 2019-06-15 LAB — LACTIC ACID, PLASMA: Lactic Acid, Venous: 1.8 mmol/L (ref 0.5–1.9)

## 2019-06-15 LAB — HIV ANTIBODY (ROUTINE TESTING W REFLEX): HIV Screen 4th Generation wRfx: NONREACTIVE

## 2019-06-15 SURGERY — CYSTOSCOPY, WITH URETHRAL DILATION
Anesthesia: General | Site: Bladder

## 2019-06-15 MED ORDER — SODIUM BICARBONATE 8.4 % IV SOLN
INTRAVENOUS | Status: AC
  Filled 2019-06-15: qty 150

## 2019-06-15 MED ORDER — POTASSIUM CHLORIDE 10 MEQ/50ML IV SOLN
10.0000 meq | INTRAVENOUS | Status: AC
  Administered 2019-06-15 (×4): 10 meq via INTRAVENOUS
  Filled 2019-06-15 (×3): qty 50

## 2019-06-15 MED ORDER — IOHEXOL 300 MG/ML  SOLN
INTRAMUSCULAR | Status: DC | PRN
  Administered 2019-06-15: 50 mL

## 2019-06-15 MED ORDER — CHLORHEXIDINE GLUCONATE CLOTH 2 % EX PADS
6.0000 | MEDICATED_PAD | Freq: Every day | CUTANEOUS | Status: DC
  Administered 2019-06-15 – 2019-06-20 (×6): 6 via TOPICAL

## 2019-06-15 MED ORDER — HYDRALAZINE HCL 20 MG/ML IJ SOLN: 10.0000 mg | INTRAMUSCULAR | Status: AC | PRN

## 2019-06-15 MED ORDER — SODIUM CHLORIDE 0.9% FLUSH
10.0000 mL | Freq: Two times a day (BID) | INTRAVENOUS | Status: DC
  Administered 2019-06-15 – 2019-06-18 (×5): 10 mL

## 2019-06-15 MED ORDER — NOREPINEPHRINE 16 MG/250ML-% IV SOLN
0.0000 ug/min | INTRAVENOUS | Status: DC
  Administered 2019-06-15: 30 ug/min via INTRAVENOUS
  Filled 2019-06-15: qty 250

## 2019-06-15 MED ORDER — POTASSIUM CHLORIDE 10 MEQ/100ML IV SOLN
10.0000 meq | INTRAVENOUS | Status: DC
  Filled 2019-06-15: qty 100

## 2019-06-15 MED ORDER — SODIUM CHLORIDE 0.9 % IV SOLN: INTRAVENOUS | Status: DC | PRN

## 2019-06-15 MED ORDER — PROPOFOL 10 MG/ML IV BOLUS
INTRAVENOUS | Status: AC
  Filled 2019-06-15: qty 20

## 2019-06-15 MED ORDER — POTASSIUM CHLORIDE 20 MEQ/15ML (10%) PO SOLN
20.0000 meq | ORAL | Status: AC
  Administered 2019-06-15: 20 meq

## 2019-06-15 MED ORDER — EPINEPHRINE 1 MG/10ML IJ SOSY
PREFILLED_SYRINGE | INTRAMUSCULAR | Status: AC
  Filled 2019-06-15: qty 30

## 2019-06-15 MED ORDER — LABETALOL HCL 5 MG/ML IV SOLN: 10.0000 mg | INTRAVENOUS | Status: AC | PRN

## 2019-06-15 MED ORDER — MIDAZOLAM HCL 2 MG/2ML IJ SOLN
INTRAMUSCULAR | Status: AC
  Filled 2019-06-15: qty 2

## 2019-06-15 MED ORDER — ENOXAPARIN SODIUM 40 MG/0.4ML ~~LOC~~ SOLN
40.0000 mg | Freq: Every day | SUBCUTANEOUS | Status: DC
  Administered 2019-06-16 – 2019-06-19 (×4): 40 mg via SUBCUTANEOUS
  Filled 2019-06-15 (×4): qty 0.4

## 2019-06-15 MED ORDER — SODIUM CHLORIDE 0.9 % IV SOLN
3.0000 g | Freq: Four times a day (QID) | INTRAVENOUS | Status: DC
  Administered 2019-06-15 – 2019-06-18 (×12): 3 g via INTRAVENOUS
  Filled 2019-06-15 (×2): qty 8
  Filled 2019-06-15 (×2): qty 3
  Filled 2019-06-15: qty 8
  Filled 2019-06-15: qty 3
  Filled 2019-06-15: qty 8
  Filled 2019-06-15: qty 3
  Filled 2019-06-15: qty 8
  Filled 2019-06-15 (×3): qty 3
  Filled 2019-06-15: qty 8
  Filled 2019-06-15: qty 3
  Filled 2019-06-15 (×2): qty 8
  Filled 2019-06-15 (×2): qty 3

## 2019-06-15 MED ORDER — POTASSIUM CHLORIDE 20 MEQ/15ML (10%) PO SOLN
20.0000 meq | ORAL | Status: DC
  Filled 2019-06-15: qty 15

## 2019-06-15 MED ORDER — FENTANYL CITRATE (PF) 250 MCG/5ML IJ SOLN
INTRAMUSCULAR | Status: AC
  Filled 2019-06-15: qty 5

## 2019-06-15 MED ORDER — CHLORHEXIDINE GLUCONATE 0.12% ORAL RINSE (MEDLINE KIT)
15.0000 mL | Freq: Two times a day (BID) | OROMUCOSAL | Status: DC
  Administered 2019-06-15 – 2019-06-16 (×3): 15 mL via OROMUCOSAL

## 2019-06-15 MED ORDER — MIDAZOLAM HCL 2 MG/2ML IJ SOLN
INTRAMUSCULAR | Status: DC | PRN
  Administered 2019-06-15: 2 mg via INTRAVENOUS

## 2019-06-15 MED ORDER — SODIUM CHLORIDE 0.9% FLUSH: 10.0000 mL | INTRAVENOUS | Status: DC | PRN

## 2019-06-15 MED ORDER — DEXTROSE 5 % IV SOLN
INTRAVENOUS | Status: DC | PRN
  Administered 2019-06-15: 2 g via INTRAVENOUS

## 2019-06-15 MED ORDER — ORAL CARE MOUTH RINSE
15.0000 mL | OROMUCOSAL | Status: DC
  Administered 2019-06-15 – 2019-06-16 (×11): 15 mL via OROMUCOSAL

## 2019-06-15 SURGICAL SUPPLY — 45 items
BAG URINE DRAINAGE (UROLOGICAL SUPPLIES) ×2 IMPLANT
BAG URO CATCHER STRL LF (MISCELLANEOUS) ×3 IMPLANT
BALLN NEPHROMAX 24X8X12 (CATHETERS) ×3
BALLN NEPHROSTOMY (BALLOONS) ×3
BALLOON NEPHROMAX 24X8X12 (CATHETERS) ×1 IMPLANT
BALLOON NEPHROSTOMY (BALLOONS) IMPLANT
CATH FOLEY 2W COUNCIL 20FR 5CC (CATHETERS) IMPLANT
CATH FOLEY 2W COUNCIL 5CC 16FR (CATHETERS) ×2 IMPLANT
CATH FOLEY 2W COUNCIL 5CC 18FR (CATHETERS) IMPLANT
CATH FOLEY 2WAY  3CC 10FR (CATHETERS)
CATH FOLEY 2WAY 3CC 10FR (CATHETERS) IMPLANT
CATH FOLEY 2WAY SLVR  5CC 14FR (CATHETERS)
CATH FOLEY 2WAY SLVR  5CC 16FR (CATHETERS)
CATH FOLEY 2WAY SLVR 5CC 14FR (CATHETERS) IMPLANT
CATH FOLEY 2WAY SLVR 5CC 16FR (CATHETERS) IMPLANT
CATH ROBINSON RED A/P 14FR (CATHETERS) IMPLANT
COVER WAND RF STERILE (DRAPES) ×3 IMPLANT
ELECT REM PT RETURN 9FT ADLT (ELECTROSURGICAL)
ELECTRODE REM PT RTRN 9FT ADLT (ELECTROSURGICAL) IMPLANT
GLOVE BIO SURGEON STRL SZ7.5 (GLOVE) ×3 IMPLANT
GOWN STRL REUS W/ TWL LRG LVL3 (GOWN DISPOSABLE) ×1 IMPLANT
GOWN STRL REUS W/ TWL XL LVL3 (GOWN DISPOSABLE) ×1 IMPLANT
GOWN STRL REUS W/TWL LRG LVL3 (GOWN DISPOSABLE) ×3
GOWN STRL REUS W/TWL XL LVL3 (GOWN DISPOSABLE) ×3
GUIDEWIRE ANG ZIPWIRE 038X150 (WIRE) IMPLANT
GUIDEWIRE STR DUAL SENSOR (WIRE) IMPLANT
HOLDER FOLEY CATH W/STRAP (MISCELLANEOUS) IMPLANT
IV NS IRRIG 3000ML ARTHROMATIC (IV SOLUTION) IMPLANT
KIT TURNOVER KIT B (KITS) ×3 IMPLANT
MANIFOLD NEPTUNE II (INSTRUMENTS) ×3 IMPLANT
NDL 18GX1X1/2 (RX/OR ONLY) (NEEDLE) IMPLANT
NDL HYPO 18GX1.5 BLUNT FILL (NEEDLE) IMPLANT
NEEDLE 18GX1X1/2 (RX/OR ONLY) (NEEDLE) IMPLANT
NEEDLE HYPO 18GX1.5 BLUNT FILL (NEEDLE) IMPLANT
NEEDLE HYPO 22GX1.5 SAFETY (NEEDLE) IMPLANT
NS IRRIG 1000ML POUR BTL (IV SOLUTION) IMPLANT
PACK CYSTO (CUSTOM PROCEDURE TRAY) ×3 IMPLANT
SET IRRIG Y TYPE TUR BLADDER L (SET/KITS/TRAYS/PACK) ×3 IMPLANT
SOL PREP POV-IOD 4OZ 10% (MISCELLANEOUS) ×3 IMPLANT
SYR 20ML LL LF (SYRINGE) IMPLANT
SYR 30ML LL (SYRINGE) IMPLANT
TUBE CONNECTING 12'X1/4 (SUCTIONS)
TUBE CONNECTING 12X1/4 (SUCTIONS) IMPLANT
WATER STERILE IRR 1000ML POUR (IV SOLUTION) IMPLANT
WATER STERILE IRR 3000ML UROMA (IV SOLUTION) ×3 IMPLANT

## 2019-06-15 NOTE — Procedures (Signed)
Arterial Catheter Insertion Procedure Note Raymond [name] [id number] 09/20/1960  Procedure: Insertion of Arterial Catheter  Indications: Blood pressure monitoring and Frequent blood sampling  Procedure Details Consent: Unable to obtain consent because of emergent medical necessity. Time Out: Verified patient identification, verified procedure, site/side was marked, verified correct patient position, special equipment/implants available, medications/allergies/relevent history reviewed, required imaging and test results available.  Performed  Maximum sterile technique was used including antiseptics, cap, gloves, gown, hand hygiene, mask and sheet. Skin prep: Chlorhexidine; local anesthetic administered 20 gauge catheter was inserted into right radial artery using the Seldinger technique. ULTRASOUND GUIDANCE USED: NO Evaluation Blood flow good; BP tracing good. Complications: No apparent complications.   Raymond [name] Raymond [name] 06/15/2019

## 2019-06-15 NOTE — Progress Notes (Signed)
EEG complete - results pending 

## 2019-06-15 NOTE — Consult Note (Signed)
I have been asked to see the patient by Dr. Trevor Mace, for evaluation and management of gross hematuria and non-functioning foley.  History of present illness: 59 year old Raymond [name] admitted to heart unit intubated and sedated after he was found down with cardiac arrest.  Patient was coded and is undergoing Arctic protocol.  Patient has a history of prostate cancer status post a prostatectomy.  A Foley catheter was placed and noted to have gross blood in the tubing and around the tubing.  Urology was consulted for gross hematuria nonfunctioning Foley.  Patient has a history of Gleason 4+5 prostate cancer status post a robotic radical prostatectomy by Dr. Gloriann Loan in October 2020.  He had a positive bladder neck margin as well as extracapsular extension and is undergoing XRT.  Review of systems: Not obtained due to patient being intubated  Patient Active Problem List   Diagnosis Date Noted  . Cardiac arrest ([place]) 06/14/2019  . AKI (acute kidney injury) ([place])   . Seizure ([place])     No current facility-administered medications on file prior to encounter.   No current outpatient medications on file prior to encounter.    No past medical history on file.    Social History   Tobacco Use  . Smoking status: Not on file  Substance Use Topics  . Alcohol use: Not on file  . Drug use: Not on file    No family history on file.  PE: Vitals:   06/15/19 0438 06/15/19 0500 06/15/19 0600 06/15/19 0700  BP:   116/83 106/78  Pulse:  65 64 65  Resp:  18 14 14   Temp:      TempSrc:      SpO2:  94% 97% 100%  Weight: 81.6 kg     Height:       Patient sedated and on a ventilator Atraumatic normocephalic head Regular sinus rhythm/rate Abdomen is soft, nontender, nondistended, well-healed scar above umbilicus GU  uncirc phallus, gross blood per urethra Lower extremities are symmetric without appreciable edema No identifiable skin lesions  Recent Labs    06/14/19 1943 06/14/19 1951  06/15/19 0415 06/15/19 0548 06/15/19 0607  WBC 11.1*  --  8.3  --   --   HGB 14.4   < > 13.6 13.3 15.0  HCT 46.5   < > 43.0 42.6 44.0   < > = values in this interval not displayed.   Recent Labs    06/14/19 1943 06/14/19 1951 06/15/19 0109 06/15/19 0109 06/15/19 0126 06/15/19 0415 06/15/19 0607  NA 138   < > 139   < > 140 136 140  K 2.5*   < > 3.3*   < > 3.2* 3.1* 3.3*  CL 100  --  107  --   --  107  --   CO2 18*  --  20*  --   --  Raymond*  --   GLUCOSE 294*  --  143*  --   --  168*  --   BUN 16  --  Raymond  --   --  20  --   CREATININE 1.67*  --  1.16  --   --  1.28*  --   CALCIUM 8.5*  --  7.9*  --   --  7.6*  --    < > = values in this interval not displayed.   Recent Labs    06/14/19 1943 06/15/19 0109 06/15/19 0619  INR 1.0 1.9* 1.1   No results for input(s): LABURIN  in the last 72 hours. Results for orders placed or performed during the hospital encounter of 06/14/19  SARS Coronavirus 2 by RT PCR (hospital order, performed in [facility] hospital lab) Nasopharyngeal Nasopharyngeal Swab     Status: None   Collection Time: 06/14/19  8:15 PM   Specimen: Nasopharyngeal Swab  Result Value Ref Range Status   SARS Coronavirus 2 NEGATIVE NEGATIVE Final    Comment: (NOTE) SARS-CoV-2 target nucleic acids are NOT DETECTED. The SARS-CoV-2 RNA is generally detectable in upper and lower respiratory specimens during the acute phase of infection. The lowest concentration of SARS-CoV-2 viral copies this assay can detect is 250 copies / mL. A negative result does not preclude SARS-CoV-2 infection and should not be used as the sole basis for treatment or other patient management decisions.  A negative result may occur with improper specimen collection / handling, submission of specimen other than nasopharyngeal swab, presence of viral mutation(s) within the areas targeted by this assay, and inadequate number of viral copies (<250 copies / mL). A negative result must be combined with  clinical observations, patient history, and epidemiological information. Fact Sheet for Patients:   StrictlyIdeas.no Fact Sheet for Healthcare Providers: BankingDealers.co.za This test is not yet approved or cleared  by the [place] FDA and has been authorized for detection and/or diagnosis of SARS-CoV-2 by FDA under an Emergency Use Authorization (EUA).  This EUA will remain in effect (meaning this test can be used) for the duration of the COVID-Raymond declaration under Section 564(b)(1) of the Act, 21 U.S.C. section 360bbb-3(b)(1), unless the authorization is terminated or revoked sooner. Performed at [facility], [place] [address] [place], [place] [address]   MRSA PCR Screening     Status: None   Collection Time: 06/14/19 10:53 PM   Specimen: Nasopharyngeal  Result Value Ref Range Status   MRSA by PCR NEGATIVE NEGATIVE Final    Comment:        The GeneXpert MRSA Assay (FDA approved for NASAL specimens only), is one component of a comprehensive MRSA colonization surveillance program. It is not intended to diagnose MRSA infection nor to guide or monitor treatment for MRSA infections. Performed at [facility], [place] [address] [place], [place] [address]     A/P: 59 year old Raymond [name] with witnessed cardiac arrest now status post PCI and cardiac catheterization lab yesterday also with history of prostate cancer status post radical prostatectomy but currently undergoing radiation found to have gross hematuria.   Procedure: Bedside cystoscopy with wire placement Surgeon: Jacalyn Lefevre, MD Pre-procedure diagnosis: gross hematuria Post-procedure diagnosis: bladder neck contracture Indication for procedure: 59 year old Raymond [name] with witnessed cardiac arrest also with history of prostate cancer status post radical prostatectomy currently undergoing XRT with nonfunctioning Foley and gross blood per urethra. Procedure in detail:  Attempts at placing 20 [place] hematuria catheter were unsuccessful.  A 17 French flexible cystoscope was then placed in the urethral meatus and advanced to the bladder neck.  At this point a bladder neck contracture was seen which would not accommodate the flexible cystoscope.  A wire was placed through the cystoscope and into the bladder.  Attempts at using the bedside dilators were unsuccessful.  There was also a false passage noted in the bulbar urethra.  After multiple failed attempts the decision was made to abort the bedside procedure and proceed in the operating room for balloon dilation. 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     Thank you for involving me in this patient's care, I will continue to follow along.Please page with any further questions or concerns. Marquise Lambson D Tynisha Ogan

## 2019-06-15 NOTE — Progress Notes (Signed)
Family call completed with 2 daughters and wife. Other daughter is at bedside. Patient aroused and followed commands. Bedside RN, Lysbeth Galas supportive and helping family at bedside.

## 2019-06-15 NOTE — Progress Notes (Signed)
Pharmacy Antibiotic Note  Raymond [name] is a 59 y.o. male admitted on 06/14/2019 with aspiration PNA.  Pharmacy has been consulted for Unasyn dosing.  Plan: Unasyn 3g IV q 6 hrs  F/u cultures, renal function and clinical course.  Height: 5\' 9"  (175.3 cm)(measured x 3 ) Weight: 81.6 kg (179 lb 14.3 oz) IBW/kg (Calculated) : 70.7  Temp (24hrs), Avg:96.3 F (35.7 C), Min:92.8 F (33.8 C), Max:97.2 F (36.2 C)  Recent Labs  Lab 06/14/19 1941 06/14/19 1943 06/14/19 1947 06/15/19 0108 06/15/19 0109 06/15/19 0415  WBC  --  11.1*  --   --   --  8.3  CREATININE 2.00* 1.67*  --   --  1.16 1.28*  LATICACIDVEN  --   --  8.3* 1.8  --   --     Estimated Creatinine Clearance: 62.1 mL/min (A) (by C-G formula based on SCr of 1.28 mg/dL (H)).    Not on File  Antimicrobials this admission:  Unasyn 6/6 >   Dose adjustments this admission:   Microbiology results:  none  Thank you for allowing pharmacy to be a part of this patient's care.  [place] Crane, Roylene Reason, BCCP Clinical Pharmacist  06/15/2019 10:59 AM   [facility] pharmacy phone numbers are listed on [email or web address]

## 2019-06-15 NOTE — Progress Notes (Signed)
NAME:  Raymond [name], MRN:  [id number], DOB:  November 20, 1960, LOS: 1 ADMISSION DATE:  06/14/2019, CONSULTATION DATE:  06/15/19 REFERRING MD:  EDP, CHIEF COMPLAINT:  Cardiac arrest   Brief History   59 year old male with prostate cancer currently undergoing chemotherapy who had seizure-like activity followed by V fib cardiac arrest. ROSC after defibrillation x5, epi x5 and total of 450 mg amiodarone and 150 mg lidocaine.  Intubated in the ED and PCCM consulted for admission.  History of present illness   Raymond [name] is a 59 year old male with a past medical history of prostate cancer currently undergoing chemotherapy, hypertension alcohol use, remote drug use who was in his usual state of health this morning.  They note he mowed his daughter's lawn and then drank a beer and laid down for a nap.  He got up and walked downstairs and about 20 minutes later sat down on the couch and was noted to be havingupper body seizure-like shaking and altered mental status.  His daughter states his respirations were sonorous and gradually got worse, she did not check a pulse or start CPR.  She believes EMS arrived less than 10 minutes later, he was in V fib and underwent approximately 35-40 minutes of ACLS. ROSC after defibrillation x5, epi x5 and total of 450 mg amiodarone and 150 mg lidocaine.  He was intubated in the ED with ROSC and etomidate, EKG showed diffuse ST depressions and cardiology was consulted.  Labs were significant of a potassium of 2.9, troponin 1,438, lactic acid 8.3, urine drug screen negative.  Family states he has no history of heart problems and has not been complaining of any chest pain or shortness of breath.  Duplicate chart: MRN [id number]   Past Medical History  Prostate cancer, hypertension, remote stroke, remote drug use  Significant Hospital Events   6/5 admit to PCCM, to Cath Lab  Consults:  Cardiology  Procedures:  6/5 ETT 6/5 cardiac cath  Significant Diagnostic Tests:  6/20  CXR>>Low lung volumes. Vague right suprahilar opacity is nonspecific. Atelectasis, pneumonia, aspiration or possibility of pulmonary contusion if there is a history of trauma.   Micro Data:  6/5 Covid-19>> pending  Antimicrobials:     Interim history/subjective:  Cooled overnight, but required increasing amounts of sedation during LHC due to bucking vent and moving arms and legs. Unsure if he has had purposeful movement at any point. No pupil reactivity for overnight RN, but withdrawing from pain.  Objective   Blood pressure 108/60, pulse 65, temperature (!) 96.4 F (35.8 C), resp. rate 16, height 5\' 9"  (1.753 m), weight 81.6 kg, SpO2 100 %.    Vent Mode: PRVC FiO2 (%):  [40 %-100 %] 40 % Set Rate:  [14 bmp-16 bmp] 16 bmp Vt Set:  [560 mL-600 mL] 560 mL PEEP:  [5 cmH20] 5 cmH20 Plateau Pressure:  [19 cmH20] 19 cmH20   Intake/Output Summary (Last 24 hours) at 06/15/2019 0902 Last data filed at 06/15/2019 0800 Gross per 24 hour  Intake 2205.07 ml  Output 1325 ml  Net 880.07 ml   Filed Weights   06/14/19 1933 06/15/19 0438  Weight: 81.6 kg 81.6 kg    General: Critically ill-appearing male intubated and sedated HEENT: [place]/AT, eyes anicteric, oral mucosa moist. Neuro: Unresponsive to verbal stimulation on fentanyl and propofol.  Positive tracheal gag, no pharyngeal gag.  Pupils pinpoint, nonreactive.  No corneal or doll's eyes reflexes.  Not breathing over the vent.  No withdraw from painful stimuli in any extremity. CV:  Regular rate and rhythm, no murmurs PULM: Faint rales bilaterally.  Gastric secretions and ET tube suction &HME.  Significant tracheal secretions during exam. GI: Soft, nontender, nondistended Extremities: No clubbing, cyanosis, or edema GU: External catheter in place, urethral bleeding. Skin: No rashes or wounds.  CXR personally reviewed- RIJ CVC in appropriate position, ET tube.  Improved right upper lobe opacity, but more diffuse bilateral opacities, possibly  pulmonary edema.  No pleural effusions.  Resolved Hospital Problem list     Assessment & Plan:    Witnessed out of hospital V. fib cardiac arrest 2/2 NSTEMI, s/p DES to circumflex Cardiogenic shock resolved  Hypertension -Continue dual antiplatelet therapy -Continue TTM protocol to 36 degrees.  Sedation as required per protocol. -Needs EEG when back from OR -Statin -Amiodarone infusion -Continue telemetry monitoring. -Continue supportive care -Echocardiogram pending --Maintain full vent support with SAT/SBT as tolerated -Neuroprotective measures-avoid fevers, hypocapnia, hypoxia or hyperoxia, electrolyte abnormalities, hyper or hypoglycemia. HOB >30 degrees. -Labetalol and hydralazine as needed for hypertension  Acute hypoxic and hypercapnic respiratory failure due to cardiac arrest, likely RUL aspiration pneumonia -start unasyn -Continue low tidal volume ventilation, 4-8 cc/kg ideal body weight with goal plateau and 30 driving pressure less than 15. -Daily SAT and SBT when appropriate -VAP prevention protocol  Bladder neck stricture causing significant bleeding -Appreciate urology's assistance -Risk versus benefits discussed with urology.  Given that he has been revascularized and is already on sedation and mechanical ventilation, his operative risks versus benefits favor having balloon dilation of his bladder neck from Foley catheter placement.  I fear that he would have progressive postrenal obstruction causing renal failure if no intervention is performed.  Seizure-like activity- Upper body shaking lasted a few minutes per family, no prior history of seizures family does not believe there has been any metastatic spread of prostate cancer but no CT head in duplicate chart -Continue propofol -EEG after OR -Seizure precautions  AKI Hypokalemia NAGMA; lactic acidosis resolved -Maintain euvolemia -Continue to monitor -Renally dose meds and avoid nephrotoxic meds -Potassium  repleted  Mild hyperglycemia-at goal currently -Accu-Cheks every 4 hours with sliding scale insulin as needed  Prostate cancer with bladder neck outflow obstruction, now hematuria and urethral trauma -Currently undergoing chemotherapy through [facility] assistance.  Planning for OR for balloon dilation and catheter placement.  Foley catheter not to be removed without urology's input.  Best practice:  Diet: N.p.o. Pain/Anxiety/Delirium protocol (if indicated): Propofol/fentanyl VAP protocol (if indicated): HOB 30 degrees suction as needed DVT prophylaxis: SCDs GI prophylaxis: Protonix Glucose control: SSI Mobility: Bedrest Code Status: Full code Family Communication: family updated by urology this AM and overnight by CCM Disposition: ICU  Labs   CBC: Recent Labs  Lab 06/14/19 1943 06/14/19 1943 06/14/19 1951 06/15/19 0126 06/15/19 0415 06/15/19 0548 06/15/19 0607  WBC 11.1*  --   --   --  8.3  --   --   NEUTROABS 8.0*  --   --   --   --   --   --   HGB 14.4   < > 15.3 13.9 13.6 13.3 15.0  HCT 46.5   < > 45.0 41.0 43.0 42.6 44.0  MCV 87.7  --   --   --  86.9  --   --   PLT 180  --   --   --  170  --   --    < > = values in this interval not displayed.    Basic  Metabolic Panel: Recent Labs  Lab 06/14/19 1941 06/14/19 1941 06/14/19 1943 06/14/19 1943 06/14/19 1951 06/15/19 0109 06/15/19 0126 06/15/19 0415 06/15/19 0607  NA 138   < > 138   < > 140 139 140 136 140  K 2.9*   < > 2.5*   < > 2.4* 3.3* 3.2* 3.1* 3.3*  CL 101  --  100  --   --  107  --  107  --   CO2  --   --  18*  --   --  20*  --  19*  --   GLUCOSE 330*  --  294*  --   --  143*  --  168*  --   BUN 19  --  16  --   --  19  --  20  --   CREATININE 2.00*  --  1.67*  --   --  1.16  --  1.28*  --   CALCIUM  --   --  8.5*  --   --  7.9*  --  7.6*  --   MG  --   --   --   --   --  2.7*  --  2.3  --   PHOS  --   --   --   --   --   --   --  3.2  --    < > = values in  this interval not displayed.   GFR: Estimated Creatinine Clearance: 62.1 mL/min (A) (by C-G formula based on SCr of 1.28 mg/dL (H)). Recent Labs  Lab 06/14/19 1943 06/14/19 1947 06/15/19 0108 06/15/19 0415  WBC 11.1*  --   --  8.3  LATICACIDVEN  --  8.3* 1.8  --     Liver Function Tests: Recent Labs  Lab 06/14/19 1943  AST 168*  ALT 104*  ALKPHOS 37*  BILITOT 0.6  PROT 6.7  ALBUMIN 3.5   No results for input(s): LIPASE, AMYLASE in the last 168 hours. No results for input(s): AMMONIA in the last 168 hours.  ABG    Component Value Date/Time   PHART 7.280 (L) 06/15/2019 0607   PCO2ART 45.7 06/15/2019 0607   PO2ART 71 (L) 06/15/2019 0607   HCO3 21.6 06/15/2019 0607   TCO2 23 06/15/2019 0607   ACIDBASEDEF 5.0 (H) 06/15/2019 0607   O2SAT 92.0 06/15/2019 0607     Coagulation Profile: Recent Labs  Lab 06/14/19 1943 06/15/19 0109 06/15/19 0619  INR 1.0 1.9* 1.1    Cardiac Enzymes: No results for input(s): CKTOTAL, CKMB, CKMBINDEX, TROPONINI in the last 168 hours.  HbA1C: No results found for: HGBA1C  CBG: Recent Labs  Lab 06/14/19 2252 06/15/19 0232 06/15/19 0406  GLUCAP 206* 151* 166*   This patient is critically ill with multiple organ system failure which requires frequent high complexity decision making, assessment, support, evaluation, and titration of therapies. This was completed through the application of advanced monitoring technologies and extensive interpretation of multiple databases. During this encounter critical care time was devoted to patient care services described in this note for 36 minutes.  Julian Hy, DO 06/15/19 9:21 AM [facility]

## 2019-06-15 NOTE — Transfer of Care (Signed)
Immediate Anesthesia Transfer of Care Note  Patient: Raymond [name]  Procedure(s) Performed: CYSTOSCOPY WITH BALLOON URETHRAL DILATATION (N/A Bladder)  Patient Location: SICU  Anesthesia Type:General  Level of Consciousness: sedated and Patient remains intubated per anesthesia plan  Airway & Oxygen Therapy: Patient remains intubated per anesthesia plan and Patient placed on Ventilator (see vital sign flow sheet for setting)  Post-op Assessment: Report given to RN and Post -op Vital signs reviewed and stable  Post vital signs: Reviewed and stable  Last Vitals:  Vitals Value Taken Time  BP    Temp    Pulse    Resp 14 06/15/19 1035  SpO2    Vitals shown include unvalidated device data.  Last Pain:  Vitals:   06/15/19 0400  TempSrc: Esophageal         Complications: No apparent anesthesia complications

## 2019-06-15 NOTE — Progress Notes (Signed)
RT and RN transported pt from [place] to CT and back w/no complications. Pts respiratory status stable throughout transport. RT will continue to monitor.

## 2019-06-15 NOTE — Op Note (Signed)
Preoperative diagnosis:        1.  Gross hematuria, bladder neck contracture  Postoperative diagnosis:  1. Bladder neck contracture and false urethral passage  Procedure:  1. Urethroscopy 2. Balloon dilation of bladder neck with ultrax balloon dilator  Surgeon: Jacalyn Lefevre, MD  Anesthesia: General  Complications: None  Intraoperative findings:   1. 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 Bladder neck contracture 3. Adequate balloon dilation with ultrax balloon dilator to 24Fr  EBL: Minimal  Specimens: None  Indication: Raymond [name] is a 59 y.o. patient with history of prostate cancer status post radical robotic prostatectomy currently undergoing XRT who had a witnessed cardiac arrest and is currently in the cardiac ICU intubated and sedated.  Patient had gross blood coming from urethra around Foley as well as into Foley. After reviewing the management options for treatment, patient's daughter elected to proceed with the above surgical procedure(s). We have discussed the potential benefits and risks of the procedure, side effects of the proposed treatment, the likelihood of the patient achieving the goals of the procedure, and any potential problems that might occur during the procedure or recuperation. Informed consent has been obtained.  Description of procedure:  The patient was taken to the operating room and general anesthesia was induced.  The patient was placed in the dorsal lithotomy position, prepped and draped in the usual sterile fashion, and preoperative antibiotics were administered. A preoperative time-out was performed.   Cystourethroscopy was performed.  The patient's urethra was examined and a false passage was noted at 6:00 in the bulbar urethra.  Further inspection of the urethra showed a bladder neck contracture.  A wire was then placed through the cystoscope into the bladder.  The cystoscope was removed.  The Ultrax balloon dilator was then placed over the wire and  inflated to 15 mmHg which dilated the balloon to 24 [place].  After this was completed it was then deflated and a 16 [place] council tip Foley was placed over the wire without difficulty.  Follow-up: Recommend patient continue Foley catheter while intubated.  Jacalyn Lefevre, M.D.

## 2019-06-15 NOTE — Progress Notes (Signed)
[facility] Progress Note Patient Name: Raymond [name] DOB: [birth year] MRN: [id number]   Date of Service  06/15/2019  HPI/Events of Note  Gross hematuria.  eICU Interventions  Stat urology consultation, stat H & H, stat Type & Screen.        Kerry Kass Yadiel Aubry 06/15/2019, 5:42 AM

## 2019-06-15 NOTE — Procedures (Signed)
Patient Name: Raymond [name]  MRN: [id number]  Epilepsy Attending: Lora Havens  Referring Physician/Provider: Dr Mickel Baas Gleason, PA Date: 06/15/2019 Duration: 24.41 mins   Patient history: 59yo M s/p cardiac arrest on TTM. EEG to evaluate for seizure.   Level of alertness: comatose  AEDs during EEG study: Propofol  Technical aspects: This EEG study was done with scalp electrodes positioned according to the 10-20 International system of electrode placement. Electrical activity was acquired at a sampling rate of 500Hz  and reviewed with a high frequency filter of 70Hz  and a low frequency filter of 1Hz . EEG data were recorded continuously and digitally stored.   Description: EEG showed continuous generalized 10-11 Hz alpha activity as well as intermittent generalized 2-3hz  delta slowing. EEG was reactive to verbal stimuli.  Hyperventilation and photic stimulation were not performed.     ABNORMALITY -Intermittent slow, generalized  IMPRESSION: This study is suggestive of moderate to severe diffuse encephalopathy, nonspecific etiology.  No seizures or definite epileptiform discharges were seen throughout the recording.   Raymond [name]

## 2019-06-15 NOTE — Progress Notes (Signed)
During renal ultrasound, it was noted that the foley catheter was not visualized in bladder and the pt had a large volume of fluid retention. CCM notified, new orders for temp foley replacement. Bleeding was noticed from urethra when first foley was removed. Advised by CCM to continue with second temp foley insertion in order to prevent clotting. Foley was placed and profuse bleeding was noted, approx. 400 ml with large volume of frank red blood unaccounted for. Urology was consulted and bedside dilation and catheter placement for CBI was attempted unsuccessfully. Plan to send pt to OR for cytoscopy.   CCM aware of bleeding--plan to continue cooling at 36 degrees. Pt yet to exhibit purposeful movement.

## 2019-06-15 NOTE — Progress Notes (Signed)
  Echocardiogram 2D Echocardiogram has been performed.  Merrie Roof F 06/15/2019, 1:16 PM

## 2019-06-15 NOTE — Anesthesia Procedure Notes (Signed)
Date/Time: 06/15/2019 9:40 AM Performed by: Amadeo Garnet, CRNA Pre-anesthesia Checklist: Patient identified, Emergency Drugs available, Suction available and Patient being monitored Patient Re-evaluated:Patient Re-evaluated prior to induction Oxygen Delivery Method: Circle system utilized Preoxygenation: Pre-oxygenation with 100% oxygen Induction Type: Inhalational induction Placement Confirmation: positive ETCO2 and breath sounds checked- equal and bilateral Dental Injury: Teeth and Oropharynx as per pre-operative assessment  Comments: Patient received intubated from [place] 12

## 2019-06-15 NOTE — Progress Notes (Signed)
R femoral sheath removed at 1035. 42ml of blood aspirated from line. Pulse notated. Manual pressure applied to site for 60mins. Vitals stable. transparent pressure dressing applied. Site level 0. Pt ventilated and sedated. Unable to education pt on post sheath removal care. Nurse to monitor site.

## 2019-06-15 NOTE — Procedures (Signed)
Central Venous Catheter Insertion Procedure Note Raymond [name] [id number] [birth year]  Procedure: Insertion of Central Venous Catheter Indications: Assessment of intravascular volume, Drug and/or fluid administration and Frequent blood sampling  Procedure Details Consent: Risks of procedure as well as the alternatives and risks of each were explained to the (patient/caregiver).  Consent for procedure obtained. Time Out: Verified patient identification, verified procedure, site/side was marked, verified correct patient position, special equipment/implants available, medications/allergies/relevent history reviewed, required imaging and test results available.  Performed  Maximum sterile technique was used including antiseptics, cap, gloves, gown, hand hygiene, mask and sheet. Skin prep: Chlorhexidine; local anesthetic administered A antimicrobial bonded/coated triple lumen catheter was placed in the right internal jugular vein using the Seldinger technique.  Evaluation Blood flow good Complications: No apparent complications Patient did tolerate procedure well. Chest X-ray ordered to verify placement.  CXR: pending.  Raymond [name] Raymond [name] 06/15/2019, 2:27 AM

## 2019-06-15 NOTE — Anesthesia Preprocedure Evaluation (Addendum)
Anesthesia Evaluation  Patient identified by MRN, date of birth, ID band Patient unresponsive    Reviewed: Allergy & Precautions, Patient's Chart, lab work & pertinent test results, Unable to perform ROS - Chart review only  Airway Mallampati: Intubated   Neck ROM: Full    Dental   Pulmonary neg pulmonary ROS,    Pulmonary exam normal breath sounds clear to auscultation       Cardiovascular + Past MI and + Cardiac Stents  Normal cardiovascular exam Rhythm:Regular Rate:Normal  LHC 06/14/2019 Out of hospital cardiac arrest most likely due to transient occlusion of the proximal circumflex with reperfusion and evidence for  85% proximal stenosis with TIMI-3 flow.  Normal LAD and right coronary artery.  Preserved global LV contractility with LVEDP 10 mmHg.  Successful PCI to the proximal circumflex vessel with PTCA and ultimate insertion of a 3.5 x 15 mm Resolute DES stent with the stenosis being reduced to 0%.   Neuro/Psych Seizures -,  negative psych ROS   GI/Hepatic negative GI ROS, Neg liver ROS,   Endo/Other  negative endocrine ROS  Renal/GU Renal disease     Musculoskeletal negative musculoskeletal ROS (+)   Abdominal   Peds  Hematology negative hematology ROS (+)   Anesthesia Other Findings   Reproductive/Obstetrics negative OB ROS                            Anesthesia Physical Anesthesia Plan  ASA: IV and emergent  Anesthesia Plan: General   Post-op Pain Management:    Induction: Intravenous  PONV Risk Score and Plan: Ondansetron and Treatment may vary due to age or medical condition  Airway Management Planned: Oral ETT  Additional Equipment:   Intra-op Plan:   Post-operative Plan: Post-operative intubation/ventilation  Informed Consent:   Plan Discussed with:   Anesthesia Plan Comments:        Anesthesia Quick Evaluation

## 2019-06-15 NOTE — Progress Notes (Addendum)
K 3.1 replaced per electrolyte protocol  Changed order to be given per OG tube and central line vs PIV that I originally wrote for.

## 2019-06-15 NOTE — Anesthesia Postprocedure Evaluation (Signed)
Anesthesia Post Note  Patient: Raymond [name]  Procedure(s) Performed: CYSTOSCOPY WITH BALLOON URETHRAL DILATATION (N/A Bladder)     Patient location during evaluation: SICU Anesthesia Type: General Level of consciousness: sedated and patient cooperative Pain management: pain level controlled Vital Signs Assessment: post-procedure vital signs reviewed and stable Respiratory status: spontaneous breathing Cardiovascular status: stable Anesthetic complications: no    Last Vitals:  Vitals:   06/15/19 1551 06/15/19 1600  BP:  119/82  Pulse:  74  Resp:  11  Temp: (!) 35.9 C   SpO2:  100%    Last Pain:  Vitals:   06/15/19 1138  TempSrc: Core                 Nolon Nations

## 2019-06-15 NOTE — Progress Notes (Signed)
Assisted tele visit to patient with family member.  Raymond [name] M, RN  

## 2019-06-15 NOTE — Progress Notes (Signed)
Assisted tele visit to patient with family member.  Zoiey Christy M, RN   

## 2019-06-15 NOTE — Progress Notes (Signed)
[facility] Progress Note Patient Name: Raymond [name] DOB: Jun 23, 1960 MRN: [id number]   Date of Service  06/15/2019  HPI/Events of Note  Pt needs his foley substituted with a temp foley, he also has hematuria likely from urethral trauma during foley insertion.  eICU Interventions  Order entered to switch regular foley for temp probe foley. Urology consultation if bleeding persists.        Kerry Kass Henrick Mcgue 06/15/2019, 5:10 AM

## 2019-06-15 NOTE — Plan of Care (Signed)
Alerted that Raymond [name] has started waking up from sedation. He is now opening his eyes to verbal stimulation and tracking. Not following commands yet. Breathing above the vent.   Will discontinue TTM and transition to avoiding fevers. Will continue to lighten sedation to continue neuro assessments. Hopefully he will be able to be extubated in the coming days.  His daughter Raymond [name] was updated about this positive status change.  Julian Hy, DO 06/15/19 4:18 PM [facility]

## 2019-06-16 ENCOUNTER — Telehealth: Payer: Self-pay | Admitting: Radiation Oncology

## 2019-06-16 ENCOUNTER — Ambulatory Visit: Payer: Medicaid Other

## 2019-06-16 LAB — BASIC METABOLIC PANEL
Anion gap: 11 (ref 5–15)
BUN: 11 mg/dL (ref 6–20)
CO2: 20 mmol/L — ABNORMAL LOW (ref 22–32)
Calcium: 7.1 mg/dL — ABNORMAL LOW (ref 8.9–10.3)
Chloride: 107 mmol/L (ref 98–111)
Creatinine, Ser: 1.12 mg/dL (ref 0.61–1.24)
GFR calc Af Amer: >60 mL/min (ref >60)
GFR calc non Af Amer: >60 mL/min (ref >60)
Glucose, Bld: 171 mg/dL — ABNORMAL HIGH (ref 70–99)
Potassium: 4.1 mmol/L (ref 3.5–5.1)
Sodium: 138 mmol/L (ref 135–145)

## 2019-06-16 LAB — POCT I-STAT, CHEM 8
BUN: 18 mg/dL (ref 6–20)
Calcium, Ion: 1.12 mmol/L — ABNORMAL LOW (ref 1.15–1.40)
Chloride: 101 mmol/L (ref 98–111)
Creatinine, Ser: 1.2 mg/dL (ref 0.61–1.24)
Glucose, Bld: 221 mg/dL — ABNORMAL HIGH (ref 70–99)
HCT: 43 % (ref 39.0–52.0)
Hemoglobin: 14.6 g/dL (ref 13.0–17.0)
Potassium: 2.6 mmol/L — CL (ref 3.5–5.1)
Sodium: 140 mmol/L (ref 135–145)
TCO2: 21 mmol/L — ABNORMAL LOW (ref 22–32)

## 2019-06-16 LAB — POCT I-STAT 7, (LYTES, BLD GAS, ICA,H+H)
Acid-base deficit: 1 mmol/L (ref 0.0–2.0)
Bicarbonate: 23.6 mmol/L (ref 20.0–28.0)
Calcium, Ion: 1.04 mmol/L — ABNORMAL LOW (ref 1.15–1.40)
HCT: 44 % (ref 39.0–52.0)
Hemoglobin: 15 g/dL (ref 13.0–17.0)
O2 Saturation: 99 %
Patient temperature: 36.6
Potassium: 3.7 mmol/L (ref 3.5–5.1)
Sodium: 142 mmol/L (ref 135–145)
TCO2: 25 mmol/L (ref 22–32)
pCO2 arterial: 37.1 mmHg (ref 32.0–48.0)
pH, Arterial: 7.411 (ref 7.350–7.450)
pO2, Arterial: 146 mmHg — ABNORMAL HIGH (ref 83.0–108.0)

## 2019-06-16 LAB — CBC
HCT: 31.5 % — ABNORMAL LOW (ref 39.0–52.0)
HCT: 30.7 % — ABNORMAL LOW (ref 39.0–52.0)
Hemoglobin: 10 g/dL — ABNORMAL LOW (ref 13.0–17.0)
Hemoglobin: 9.7 g/dL — ABNORMAL LOW (ref 13.0–17.0)
MCH: 27.5 pg (ref 26.0–34.0)
MCH: 27.3 pg (ref 26.0–34.0)
MCHC: 31.7 g/dL (ref 30.0–36.0)
MCHC: 31.6 g/dL (ref 30.0–36.0)
MCV: 86.8 fL (ref 80.0–100.0)
MCV: 86.5 fL (ref 80.0–100.0)
Platelets: 154 10*3/uL (ref 150–400)
Platelets: 132 10*3/uL — ABNORMAL LOW (ref 150–400)
RBC: 3.63 MIL/uL — ABNORMAL LOW (ref 4.22–5.81)
RBC: 3.55 MIL/uL — ABNORMAL LOW (ref 4.22–5.81)
RDW: 16 % — ABNORMAL HIGH (ref 11.5–15.5)
RDW: 16.1 % — ABNORMAL HIGH (ref 11.5–15.5)
WBC: 13.3 10*3/uL — ABNORMAL HIGH (ref 4.0–10.5)
WBC: 10.5 10*3/uL (ref 4.0–10.5)
nRBC: 0 % (ref 0.0–0.2)
nRBC: 0 % (ref 0.0–0.2)

## 2019-06-16 LAB — GLUCOSE, CAPILLARY
Glucose-Capillary: 162 mg/dL — ABNORMAL HIGH (ref 70–99)
Glucose-Capillary: 135 mg/dL — ABNORMAL HIGH (ref 70–99)
Glucose-Capillary: 130 mg/dL — ABNORMAL HIGH (ref 70–99)
Glucose-Capillary: 116 mg/dL — ABNORMAL HIGH (ref 70–99)

## 2019-06-16 LAB — LIPID PANEL
Cholesterol: 129 mg/dL (ref 0–200)
HDL: 27 mg/dL — ABNORMAL LOW (ref >40)
LDL Cholesterol: 28 mg/dL (ref 0–99)
Total CHOL/HDL Ratio: 4.8 RATIO
Triglycerides: 368 mg/dL — ABNORMAL HIGH (ref <150)
VLDL: 74 mg/dL — ABNORMAL HIGH (ref 0–40)

## 2019-06-16 LAB — POCT ACTIVATED CLOTTING TIME: Activated Clotting Time: 417 seconds

## 2019-06-16 MED ORDER — ATORVASTATIN CALCIUM 80 MG PO TABS
80.0000 mg | ORAL_TABLET | Freq: Every day | ORAL | Status: DC
  Administered 2019-06-16 – 2019-06-17 (×2): 80 mg
  Filled 2019-06-16 (×2): qty 1

## 2019-06-16 MED ORDER — ASPIRIN 81 MG PO CHEW
81.0000 mg | CHEWABLE_TABLET | Freq: Every day | ORAL | Status: DC
  Administered 2019-06-16 – 2019-06-17 (×2): 81 mg
  Filled 2019-06-16 (×2): qty 1

## 2019-06-16 MED ORDER — POLYETHYLENE GLYCOL 3350 17 G PO PACK: 17.0000 g | PACK | Freq: Every day | ORAL | Status: DC | PRN

## 2019-06-16 MED ORDER — DOCUSATE SODIUM 50 MG/5ML PO LIQD: 100.0000 mg | Freq: Two times a day (BID) | ORAL | Status: DC | PRN

## 2019-06-16 MED ORDER — TICAGRELOR 90 MG PO TABS
90.0000 mg | ORAL_TABLET | Freq: Two times a day (BID) | ORAL | Status: DC
  Administered 2019-06-16 – 2019-06-17 (×3): 90 mg
  Filled 2019-06-16 (×3): qty 1

## 2019-06-16 MED ORDER — ORAL CARE MOUTH RINSE
15.0000 mL | Freq: Two times a day (BID) | OROMUCOSAL | Status: DC
  Administered 2019-06-16 – 2019-06-19 (×7): 15 mL via OROMUCOSAL

## 2019-06-16 MED ORDER — ACETAMINOPHEN 325 MG PO TABS
650.0000 mg | ORAL_TABLET | ORAL | Status: DC | PRN
  Administered 2019-06-16 – 2019-06-17 (×3): 650 mg
  Filled 2019-06-16 (×3): qty 2

## 2019-06-16 NOTE — Telephone Encounter (Signed)
Patient did not show for radiation therapy this morning as he typically does. Phoned to inquire. Spoke with emergency contact, Alexis. She reports the patient had a heart attack two day ago and was down for 45 minutes. She reports he is at [facility] on 2 H12 CVICU. Phoned Janett Billow, RN in CVICU who explains the patient is awake, rewarmed and the current plan is to work on extubation. Verbalized to Janett Billow, RN that  radiation has been cancelled for today. Noted the patient had a cystoscopy with balloon urethral dilation yesterday. Requested dual charts be merged with the help of IT.

## 2019-06-16 NOTE — Progress Notes (Signed)
Pt checked regarding LTM electrodes; impedances <10 Ohms. No skin break down noted.

## 2019-06-16 NOTE — Progress Notes (Signed)
Progress Note  Patient Name: Raymond [name] Date of Encounter: 06/16/2019   [facility] Cardiologist: New  Subjective   Patient awake but not very responsive  IN NAD   Inpatient Medications    Scheduled Meds: . aspirin  81 mg Per Tube Daily  . atorvastatin  80 mg Per Tube Daily  . chlorhexidine gluconate (MEDLINE KIT)  15 mL Mouth Rinse BID  . Chlorhexidine Gluconate Cloth  6 each Topical Daily  . enoxaparin (LOVENOX) injection  40 mg Subcutaneous Daily  . fentaNYL (SUBLIMAZE) injection  50 mcg Intravenous Once  . mouth rinse  15 mL Mouth Rinse 10 times per day  . pantoprazole (PROTONIX) IV  40 mg Intravenous QHS  . sodium chloride flush  10-40 mL Intracatheter Q12H  . sodium chloride flush  3 mL Intravenous Q12H  . ticagrelor  90 mg Per Tube BID   Continuous Infusions: . sodium chloride    . sodium chloride 100 mL/hr at 06/15/19 0900  . sodium chloride    . sodium chloride    . amiodarone 30 mg/hr (06/16/19 0700)  . ampicillin-sulbactam (UNASYN) IV 3 g (06/16/19 0848)  . fentaNYL infusion INTRAVENOUS 150 mcg/hr (06/16/19 0700)  . norepinephrine (LEVOPHED) Adult infusion Stopped (06/16/19 0657)  . propofol (DIPRIVAN) infusion 20 mcg/kg/min (06/16/19 0700)  . sodium chloride 175 mL/hr at 06/14/19 2227   PRN Meds: sodium chloride, Place/Maintain arterial line **AND** sodium chloride, Place/Maintain arterial line **AND** sodium chloride, acetaminophen, docusate, fentaNYL, ondansetron (ZOFRAN) IV, polyethylene glycol, sodium chloride flush, sodium chloride flush   Vital Signs    Vitals:   06/16/19 0805 06/16/19 0816 06/16/19 0900 06/16/19 0902  BP:      Pulse:  (!) 59    Resp:  18    Temp: 98.4 F (36.9 C)  98.8 F (37.1 C)   TempSrc:   Bladder   SpO2:  100%  100%  Weight:      Height:        Intake/Output Summary (Last 24 hours) at 06/16/2019 0920 Last data filed at 06/16/2019 0900 Gross per 24 hour  Intake 2211.73 ml  Output 2218 ml  Net -6.27 ml   Last 3  Weights 06/16/2019 06/15/2019 06/14/2019  Weight (lbs) 183 lb 6.8 oz 179 lb 14.3 oz 180 lb  Weight (kg) 83.2 kg 81.6 kg 81.647 kg      Telemetry    SR   - Personally Reviewed  ECG    None to review- Personally Reviewed  Physical Exam   GEN: No acute distress.   Neck: No JVD Cardiac: RRR, no murmurs Respiratory: Clear to auscultation bilaterally. GI: Soft, nontender, non-distended  MS: No edema; No deformity. Neuro:  Awake   Waves to wife on screen     Labs    High Sensitivity Troponin:   Recent Labs  Lab 06/14/19 1943 06/15/19 0109  TROPONINIHS 1,438* >27,000*      Chemistry Recent Labs  Lab 06/14/19 1943 06/14/19 1951 06/15/19 0415 06/15/19 0607 06/15/19 1450 06/16/19 0424 06/16/19 0724  NA 138   < > 136   < > 139 138 142  K 2.5*   < > 3.1*   < > 4.2 4.1 3.7  CL 100   < > 107  --  108 107  --   CO2 18*   < > 19*  --  17* 20*  --   GLUCOSE 294*   < > 168*  --  129* 171*  --   BUN 16   < >  20  --  16 11  --   CREATININE 1.67*   < > 1.28*  --  1.28* 1.12  --   CALCIUM 8.5*   < > 7.6*  --  7.3* 7.1*  --   PROT 6.7  --   --   --   --   --   --   ALBUMIN 3.5  --   --   --   --   --   --   AST 168*  --   --   --   --   --   --   ALT 104*  --   --   --   --   --   --   ALKPHOS 37*  --   --   --   --   --   --   BILITOT 0.6  --   --   --   --   --   --   GFRNONAA 44*   < > >60  --  >60 >60  --   GFRAA 51*   < > >60  --  >60 >60  --   ANIONGAP 20*   < > 10  --  14 11  --    < > = values in this interval not displayed.     Hematology Recent Labs  Lab 06/14/19 1943 06/14/19 1951 06/15/19 0415 06/15/19 0548 06/15/19 0607 06/16/19 0424 06/16/19 0724  WBC 11.1*  --  8.3  --   --  13.3*  --   RBC 5.30  --  4.95  --   --  3.63*  --   HGB 14.4   < > 13.6   < > 15.0 10.0* 15.0  HCT 46.5   < > 43.0   < > 44.0 31.5* 44.0  MCV 87.7  --  86.9  --   --  86.8  --   MCH 27.2  --  27.5  --   --  27.5  --   MCHC 31.0  --  31.6  --   --  31.7  --   RDW 15.3  --  15.6*   --   --  16.0*  --   PLT 180  --  170  --   --  154  --    < > = values in this interval not displayed.    BNP Recent Labs  Lab 06/14/19 1943  BNP 40.2     DDimer No results for input(s): DDIMER in the last 168 hours.   Radiology    DG Abd 1 View  Result Date: 06/15/2019 CLINICAL DATA:  OG tube placement. EXAM: ABDOMEN - 1 VIEW COMPARISON:  None. FINDINGS: Tip and side port of the enteric tube below the diaphragm in the stomach. Normal bowel gas pattern in the upper abdomen. IMPRESSION: Tip and side port of the enteric tube below the diaphragm in the stomach. Electronically Signed   By: Keith Rake M.D.   On: 06/15/2019 03:30   CT HEAD WO CONTRAST  Result Date: 06/15/2019 CLINICAL DATA:  Encephalopathy.  Post cardiac arrest. EXAM: CT HEAD WITHOUT CONTRAST TECHNIQUE: Contiguous axial images were obtained from the base of the skull through the vertex without intravenous contrast. COMPARISON:  None. FINDINGS: Brain: No intracranial hemorrhage, mass effect, or midline shift. No hydrocephalus. The basilar cisterns are patent. Gray-white differentiation is preserved. There is no evidence of cerebral edema. No evidence of territorial infarct or acute ischemia. No extra-axial  or intracranial fluid collection. Vascular: Intravascular IV contrast from recent cardiac catheterization. Hyperdense vessel. Mild skull base atherosclerosis. Skull: No fracture or focal lesion. Sinuses/Orbits: Nasogastric tube in right near. Minor mucosal thickening of ethmoid air cells. Mastoid air cells are clear. Orbits are unremarkable. Other: None. IMPRESSION: No acute intracranial abnormality. Electronically Signed   By: Keith Rake M.D.   On: 06/15/2019 01:05   CARDIAC CATHETERIZATION  Result Date: 06/14/2019  Post intervention, there is a 0% residual stenosis.  Prox Cx to Mid Cx lesion is 85% stenosed.  A stent was successfully placed.  Out of hospital cardiac arrest most likely due to transient occlusion of  the proximal circumflex with reperfusion and evidence for  85% proximal stenosis with TIMI-3 flow. Normal LAD and right coronary artery. Preserved global LV contractility with LVEDP 10 mmHg. Successful PCI to the proximal circumflex vessel with PTCA and ultimate insertion of a 3.5 x 15 mm Resolute DES stent with the stenosis being reduced to 0%. RECOMMENDATION: DAPT for minimum of a year.  Hypothermia protocol will be instituted.  We will continue IV Cangrelor.  Initiate Brilinta via NG tube once the patient is in the CCU with discontinuance of Cangrelor 30 to 60 minutes post Brilinta administration.  We will continue bivalirudin for 2 hours post procedure.  2D echo Doppler study in a.m.   US RENAL  Result Date: 06/15/2019 CLINICAL DATA:  Acute kidney injury. EXAM: RENAL / URINARY TRACT ULTRASOUND COMPLETE COMPARISON:  None. FINDINGS: Right Kidney: Renal measurements: 10.5 x 5.1 x 5.4 cm = volume: 150.1 mL . Echogenicity within normal limits. No mass or hydronephrosis visualized. Left Kidney: Renal measurements: 11.5 x 5.5 x 5.6 cm = volume: 186.2 mL. Echogenicity within normal limits. No mass or hydronephrosis visualized. Bladder: Bladder distended.  No wall thickening, masses or stones. Other: None. IMPRESSION: Normal renal ultrasound. Electronically Signed   By: Lajean Manes M.D.   On: 06/15/2019 07:49   DG CHEST PORT 1 VIEW  Result Date: 06/15/2019 CLINICAL DATA:  Central line placement. EXAM: PORTABLE CHEST 1 VIEW COMPARISON:  Yesterday FINDINGS: Tip of the right internal jugular central venous catheter in the mid SVC. No pneumothorax. Endotracheal tube tip is 4.6 cm from the carina. Enteric tube is in place tip below the diaphragm not included in this chest field of view. Mild cardiomegaly. Developing interstitial opacities suspicious for pulmonary edema. More focal opacity in the right upper lobe again seen. No large pleural effusion. IMPRESSION: 1. Tip of the right internal jugular central venous  catheter in the mid SVC. No pneumothorax. 2. Developing interstitial opacities suspicious for pulmonary edema. Cardiomegaly again seen. 3. Slightly more focal right upper lobe opacity is unchanged from yesterday, atelectasis versus pneumonia. Electronically Signed   By: Keith Rake M.D.   On: 06/15/2019 03:32   DG Chest Portable 1 View  Result Date: 06/14/2019 CLINICAL DATA:  Verify endotracheal and orogastric tube placement. EXAM: PORTABLE CHEST 1 VIEW COMPARISON:  None. FINDINGS: Endotracheal tube tip 4.2 cm from the carina. Enteric tube tip and side-port below the diaphragm in the stomach. Low lung volumes. Upper normal heart size. Vague right suprahilar opacity. No visualized pneumothorax or large pleural effusion. No acute osseous abnormalities are seen. IMPRESSION: 1. Endotracheal tube tip 4.2 cm from the carina. Enteric tube tip and side-port below the diaphragm in the stomach. 2. Low lung volumes. Vague right suprahilar opacity is nonspecific. Atelectasis, pneumonia, aspiration or possibility of pulmonary contusion if there is a history of trauma. Electronically Signed  By: Keith Rake M.D.   On: 06/14/2019 19:46   EEG adult  Result Date: 06/15/2019 Lora Havens, MD     06/15/2019  4:33 PM Patient Name: Mandy Fitzwater MRN: [id number] Epilepsy Attending: Lora Havens Referring Physician/Provider: Dr Mickel Baas Gleason, PA Date: 06/15/2019 Duration: 24.41 mins Patient history: 59yo M s/p cardiac arrest on TTM. EEG to evaluate for seizure. Level of alertness: comatose AEDs during EEG study: Propofol Technical aspects: This EEG study was done with scalp electrodes positioned according to the 10-20 International system of electrode placement. Electrical activity was acquired at a sampling rate of '500Hz'  and reviewed with a high frequency filter of '70Hz'  and a low frequency filter of '1Hz' . EEG data were recorded continuously and digitally stored. Description: EEG showed continuous generalized 10-11 Hz  alpha activity as well as intermittent generalized 2-'3hz'  delta slowing. EEG was reactive to verbal stimuli.  Hyperventilation and photic stimulation were not performed.   ABNORMALITY -Intermittent slow, generalized IMPRESSION: This study is suggestive of moderate to severe diffuse encephalopathy, nonspecific etiology.  No seizures or definite epileptiform discharges were seen throughout the recording. Lora Havens   DG C-Arm 1-60 Min-No Report  Result Date: 06/15/2019 Fluoroscopy was utilized by the requesting physician.  No radiographic interpretation.   ECHOCARDIOGRAM COMPLETE  Result Date: 06/15/2019    ECHOCARDIOGRAM REPORT   Patient Name:   UCHECHUKWU DHAWAN Date of Exam: 06/15/2019 Medical Rec #:  [id number]   Height:       69.0 in Accession #:    [id number]  Weight:       179.9 lb Date of Birth:  1960-09-21   BSA:          1.975 m Patient Age:    17 years    BP:           131/62 mmHg Patient Gender: M           HR:           76 bpm. Exam Location:  Inpatient Procedure: 2D Echo, Cardiac Doppler and Color Doppler Indications:    Cardiac Arrest I46.9  History:        Patient has no prior history of Echocardiogram examinations.  Sonographer:    Merrie Roof RDCS Referring Phys: Graham  1. Left ventricular ejection fraction, by estimation, is 55 to 60%. The left ventricle has normal function. The left ventricle has no regional wall motion abnormalities. There is mild left ventricular hypertrophy. Left ventricular diastolic parameters are indeterminate.  2. Right ventricular systolic function is normal. The right ventricular size is normal.  3. The mitral valve is normal in structure. No evidence of mitral valve regurgitation. No evidence of mitral stenosis.  4. The aortic valve is tricuspid. Aortic valve regurgitation is not visualized. No aortic stenosis is present. FINDINGS  Left Ventricle: Left ventricular ejection fraction, by estimation, is 55 to 60%. The left ventricle has normal  function. The left ventricle has no regional wall motion abnormalities. The left ventricular internal cavity size was normal in size. There is  mild left ventricular hypertrophy. Left ventricular diastolic parameters are indeterminate. Right Ventricle: The right ventricular size is normal. No increase in right ventricular wall thickness. Right ventricular systolic function is normal. Left Atrium: Left atrial size was normal in size. Right Atrium: Right atrial size was normal in size. Pericardium: There is no evidence of pericardial effusion. Mitral Valve: The mitral valve is normal in structure. No evidence of mitral valve regurgitation. No  evidence of mitral valve stenosis. Tricuspid Valve: The tricuspid valve is normal in structure. Tricuspid valve regurgitation is trivial. No evidence of tricuspid stenosis. Aortic Valve: The aortic valve is tricuspid. Aortic valve regurgitation is not visualized. No aortic stenosis is present. Aortic valve mean gradient measures 5.1 mmHg. Aortic valve peak gradient measures 10.0 mmHg. Aortic valve area, by VTI measures 1.98  cm. Pulmonic Valve: The pulmonic valve was not well visualized. Pulmonic valve regurgitation is mild. No evidence of pulmonic stenosis. Aorta: The aortic root is normal in size and structure. Venous: IVC assessment for right atrial pressure unable to be performed due to mechanical ventilation. IAS/Shunts: No atrial level shunt detected by color flow Doppler.  LEFT VENTRICLE PLAX 2D LVIDd:         4.90 cm      Diastology LVIDs:         3.40 cm      LV e' lateral:   5.98 cm/s LV PW:         1.20 cm      LV E/e' lateral: 7.5 LV IVS:        1.20 cm      LV e' medial:    8.49 cm/s LVOT diam:     2.00 cm      LV E/e' medial:  5.3 LV SV:         43 LV SV Index:   22 LVOT Area:     3.14 cm  LV Volumes (MOD) LV vol d, MOD A2C: 77.1 ml LV vol d, MOD A4C: 127.0 ml LV vol s, MOD A2C: 37.2 ml LV vol s, MOD A4C: 47.6 ml LV SV MOD A2C:     39.9 ml LV SV MOD A4C:     127.0  ml LV SV MOD BP:      63.2 ml RIGHT VENTRICLE             IVC RV Basal diam:  3.40 cm     IVC diam: 2.10 cm RV S prime:     19.40 cm/s TAPSE (M-mode): 2.4 cm LEFT ATRIUM             Index       RIGHT ATRIUM           Index LA diam:        3.20 cm 1.62 cm/m  RA Area:     21.30 cm LA Vol (A2C):   81.0 ml 41.01 ml/m RA Volume:   61.80 ml  31.29 ml/m LA Vol (A4C):   51.2 ml 25.92 ml/m LA Biplane Vol: 69.5 ml 35.19 ml/m  AORTIC VALVE AV Area (Vmax):    1.83 cm AV Area (Vmean):   1.89 cm AV Area (VTI):     1.98 cm AV Vmax:           158.11 cm/s AV Vmean:          106.477 cm/s AV VTI:            0.218 m AV Peak Grad:      10.0 mmHg AV Mean Grad:      5.1 mmHg LVOT Vmax:         92.22 cm/s LVOT Vmean:        64.051 cm/s LVOT VTI:          0.137 m LVOT/AV VTI ratio: 0.63  AORTA Ao Root diam: 3.60 cm Ao Asc diam:  3.30 cm MITRAL VALVE MV Area (PHT): 3.12 cm  SHUNTS MV Decel Time: 243 msec    Systemic VTI:  0.14 m MV E velocity: 45.00 cm/s  Systemic Diam: 2.00 cm MV A velocity: 47.60 cm/s MV E/A ratio:  0.95 Carlyle Dolly MD Electronically signed by Carlyle Dolly MD Signature Date/Time: 06/15/2019/1:43:50 PM    Final     Cardiac Studies   L heart cath  06/14/19   Post intervention, there is a 0% residual stenosis.  Prox Cx to Mid Cx lesion is 85% stenosed.  A stent was successfully placed.   Out of hospital cardiac arrest most likely due to transient occlusion of the proximal circumflex with reperfusion and evidence for  85% proximal stenosis with TIMI-3 flow.  Normal LAD and right coronary artery.  Preserved global LV contractility with LVEDP 10 mmHg.  Successful PCI to the proximal circumflex vessel with PTCA and ultimate insertion of a 3.5 x 15 mm Resolute DES stent with the stenosis being reduced to 0%.  RECOMMENDATION: DAPT for minimum of a year.  Hypothermia protocol will be instituted.  We will continue IV Cangrelor.  Initiate Brilinta via NG tube once the patient is in the CCU  with discontinuance of Cangrelor 30 to 60 minutes post Brilinta administration.  We will continue bivalirudin for 2 hours post procedure.  2D echo Doppler study in a.m.  Echo 06/16/19 1. Left ventricular ejection fraction, by estimation, is 55 to 60%. The left ventricle has normal function. The left ventricle has no regional wall motion abnormalities. There is mild left ventricular hypertrophy. Left ventricular diastolic parameters are indeterminate. 2. Right ventricular systolic function is normal. The right ventricular size is normal. 3. The mitral valve is normal in structure. No evidence of mitral valve regurgitation. No evidence of mitral stenosis. 4. The aortic valve is tricuspid. Aortic valve regurgitation is not visualized. No aortic stenosis is present.  Patient Profile    Delonte Musich is a 59 y.o. male with a hx of stage III prostate cancer (currently receiving XRT), HTN, HLD, remote substance abuse who is being seen today for the evaluation of cardiac arrest.  Assessment & Plan    1  CAD   S/p VF arrest  Pt defib x 5; amio/lidocaine  Underwent hypothermia protocol.  Cath as noted above with intervention ot LCx   LVEF normal    Continue ASA/Brilinta/ metoprolol/ amiodarone/ lipitro  2  Rhythm   Keep on amiodarone IV  for now    3   HL  Keep on lipitor  Full lipid panel pending    4  Neurologic   EEG with diffuse slowing   Responding some to commands   Neuro following    5  Pulm  Currently intubated      For questions or updates, please contact [facility] Please consult [email or web address] for contact info under        Signed, Dorris Carnes, MD  06/16/2019, 9:20 AM

## 2019-06-16 NOTE — Progress Notes (Signed)
NAME:  Raymond [name], MRN:  [id number], DOB:  06-May-1960, LOS: 2 ADMISSION DATE:  06/14/2019, CONSULTATION DATE:  06/16/19 REFERRING MD:  EDP, CHIEF COMPLAINT:  Cardiac arrest   Brief History   59 year old male with prostate cancer currently undergoing chemotherapy who had seizure-like activity followed by V fib cardiac arrest. ROSC after defibrillation x5, epi x5 and total of 450 mg amiodarone and 150 mg lidocaine.  Intubated in the ED and PCCM consulted for admission.  Past Medical History  Prostate cancer, hypertension, remote stroke, remote drug use  Significant Hospital Events   6/5 Admit to PCCM, to Cath Lab and stenting of Cx 6/6 Hypothermia protocol stopped as he woke up. Developed hematuria > Underwent urethroscopy and balloon dilatation of bladder neck by urology 6/7-Following commands, starting SBT's  Consults:  Cardiology  Procedures:  6/5 ETT 6/5 cardiac cath- Prox Cx to Mid Cx lesion is 85% stenosed > stented  Significant Diagnostic Tests:  CT head 6/6-no acute intracranial abnormality  Echo 6/6-LVEF 55-60%, mild LVH, normal RV systolic function and size.  EEG 6/7-moderate- severe diffuse encephalopathy  Micro Data:  6/5 Covid-19>> pending  Antimicrobials:  Unasyn 6/6 >>  Interim history/subjective:   More awake today, following commands Off pressors  Objective   Blood pressure 119/83, pulse (!) 59, temperature 98.4 F (36.9 C), resp. rate 18, height 5\' 9"  (1.753 m), weight 83.2 kg, SpO2 100 %.    Vent Mode: PRVC FiO2 (%):  [35 %-50 %] 35 % Set Rate:  [16 bmp-18 bmp] 16 bmp Vt Set:  [560 mL] 560 mL PEEP:  [5 cmH20] 5 cmH20 Plateau Pressure:  [11 cmH20-19 cmH20] 19 cmH20   Intake/Output Summary (Last 24 hours) at 06/16/2019 0839 Last data filed at 06/16/2019 0800 Gross per 24 hour  Intake 2179.9 ml  Output 2170 ml  Net 9.9 ml   Filed Weights   06/14/19 1933 06/15/19 0438 06/16/19 0500  Weight: 81.6 kg 81.6 kg 83.2 kg   Examination: Blood pressure  119/83, pulse (!) 59, temperature 98.4 F (36.9 C), resp. rate 18, height 5\' 9"  (1.753 m), weight 83.2 kg, SpO2 100 %. Gen:      No acute distress HEENT:  EOMI, sclera anicteric Neck:     No masses; no thyromegaly Lungs:    Clear to auscultation bilaterally; normal respiratory effort CV:         Regular rate and rhythm; no murmurs Abd:      + bowel sounds; soft, non-tender; no palpable masses, no distension Ext:    No edema; adequate peripheral perfusion Skin:      Warm and dry; no rash Neuro: Awake, responsive  Labs significant for BUN/creatinine 11/1.12, WBC 13.3, hemoglobin 10 No new imaging  Resolved Hospital Problem list   AKI Hypokalemia NAGMA; lactic acidosis  Assessment & Plan:  Witnessed out of hospital V. 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 Appreciate urology assistance.   Best practice:  Diet: N.p.o. Pain/Anxiety/Delirium protocol (if indicated): Propofol/fentanyl VAP protocol (if indicated): HOB 30 degrees suction as needed DVT prophylaxis: SCDs GI prophylaxis: Protonix Glucose control: SSI Mobility: Bedrest Code Status: Full code Family Communication: Pending Disposition: ICU  Critical care time:   The patient is  critically ill with multiple organ system failure and requires high complexity decision making for assessment and support, frequent evaluation and titration of therapies, advanced monitoring, review of radiographic studies and interpretation of complex data.   Critical Care Time devoted to patient care services, exclusive of separately billable procedures, described in this note is 35 minutes.   Marshell Garfinkel MD [facility] Please see [email or web address] for pager details.  06/16/2019, 8:51 AM

## 2019-06-16 NOTE — Progress Notes (Signed)
Assisted tele visit to patient with family member.  Dillon Mcreynolds P, RN  

## 2019-06-16 NOTE — Plan of Care (Signed)
Pt doing well. Responsive to voice, follows commands, and nods/gestures appropriately. Maintaining normothermia without complications. VSS. Tolerates ventilator well. Restraints have been taken off in exchange for mittens. Minimal bleeding at urinary catheter site--pt denies pain when performing foley care. Femoral sheath site remains a level 0 and is without complications. RN will continue to monitor.   Problem: Clinical Measurements: Goal: Ability to maintain clinical measurements within normal limits will improve Outcome: Progressing Goal: Diagnostic test results will improve Outcome: Progressing Goal: Respiratory complications will improve Outcome: Progressing   Problem: Coping: Goal: Level of anxiety will decrease Outcome: Progressing   Problem: Pain Managment: Goal: General experience of comfort will improve Outcome: Progressing   Problem: Skin Integrity: Goal: Risk for impaired skin integrity will decrease Outcome: Progressing

## 2019-06-16 NOTE — Progress Notes (Signed)
Removed TTM pads from patient, as he is Alert and Oriented x4, following commands.  Eden Emms RN

## 2019-06-16 NOTE — Progress Notes (Signed)
LTM EEG discontinued - no skin breakdown at unhook.   

## 2019-06-16 NOTE — Progress Notes (Signed)
Assisted tele visit to patient with family member.  Paelyn Smick P, RN  

## 2019-06-16 NOTE — Procedures (Addendum)
Patient Name: Raymond [name]  MRN: [id number]  Epilepsy Attending: Lora Havens  Referring Physician/Provider: Dr Elease Etienne, PA Date: 06/15/2019 1503 to 06/16/2019 1147  Patient history: 59yo M s/p cardiac arrest on TTM. EEG to evaluate for seizure.   Level of alertness: awake, asleep  AEDs during EEG study: Propofol  Technical aspects: This EEG study was done with scalp electrodes positioned according to the 10-20 International system of electrode placement. Electrical activity was acquired at a sampling rate of 500Hz  and reviewed with a high frequency filter of 70Hz  and a low frequency filter of 1Hz . EEG data were recorded continuously and digitally stored.   Description: No clear posterior dominant was seen. Sleep was characterized by vertex waves, sleep spindles (12 to 14 Hz), maximal frontocentral region. EEG showed continuous generalized 8-9Hz  theta- alpha activity as well as intermittent generalized 3-5h theta- delta slowing. Hyperventilation and photic stimulation were not performed.     ABNORMALITY -Intermittent slow, generalized  IMPRESSION: This study is suggestive of moderate to severe diffuse encephalopathy, nonspecific etiology.  No seizures or definite epileptiform discharges were seen throughout the recording.  EEG appears to be improving compared to previous day.  Raymond [name]

## 2019-06-16 NOTE — Procedures (Signed)
Extubation Procedure Note  Patient Details:   Name: Raymond [name] DOB: 05-Oct-1960 MRN: [id number]   Airway Documentation:    Vent end date: 06/16/19 Vent end time: 1020   Evaluation  O2 sats: stable throughout Complications: No apparent complications Patient did tolerate procedure well. Bilateral Breath Sounds: Diminished  Patient able to speak? Yes  Rudene Re 06/16/2019, 10:22 AM

## 2019-06-17 ENCOUNTER — Ambulatory Visit: Payer: Medicaid Other

## 2019-06-17 ENCOUNTER — Inpatient Hospital Stay (HOSPITAL_COMMUNITY): Payer: Medicaid Other

## 2019-06-17 LAB — POCT I-STAT 7, (LYTES, BLD GAS, ICA,H+H)
Acid-Base Excess: 1 mmol/L (ref 0.0–2.0)
Bicarbonate: 25.1 mmol/L (ref 20.0–28.0)
Calcium, Ion: 1.12 mmol/L — ABNORMAL LOW (ref 1.15–1.40)
HCT: 24 % — ABNORMAL LOW (ref 39.0–52.0)
Hemoglobin: 8.2 g/dL — ABNORMAL LOW (ref 13.0–17.0)
O2 Saturation: 94 %
Patient temperature: 98.9
Potassium: 2.7 mmol/L — CL (ref 3.5–5.1)
Sodium: 143 mmol/L (ref 135–145)
TCO2: 26 mmol/L (ref 22–32)
pCO2 arterial: 34.8 mmHg (ref 32.0–48.0)
pH, Arterial: 7.467 — ABNORMAL HIGH (ref 7.350–7.450)
pO2, Arterial: 66 mmHg — ABNORMAL LOW (ref 83.0–108.0)

## 2019-06-17 LAB — BASIC METABOLIC PANEL WITH GFR
Anion gap: 10 (ref 5–15)
BUN: 7 mg/dL (ref 6–20)
CO2: 27 mmol/L (ref 22–32)
Calcium: 8 mg/dL — ABNORMAL LOW (ref 8.9–10.3)
Chloride: 103 mmol/L (ref 98–111)
Creatinine, Ser: 1.1 mg/dL (ref 0.61–1.24)
GFR calc Af Amer: >60 mL/min (ref >60)
GFR calc non Af Amer: >60 mL/min (ref >60)
Glucose, Bld: 139 mg/dL — ABNORMAL HIGH (ref 70–99)
Potassium: 2.7 mmol/L — CL (ref 3.5–5.1)
Sodium: 140 mmol/L (ref 135–145)

## 2019-06-17 LAB — BASIC METABOLIC PANEL
Anion gap: 9 (ref 5–15)
BUN: 7 mg/dL (ref 6–20)
CO2: 24 mmol/L (ref 22–32)
Calcium: 7.6 mg/dL — ABNORMAL LOW (ref 8.9–10.3)
Chloride: 108 mmol/L (ref 98–111)
Creatinine, Ser: 1.04 mg/dL (ref 0.61–1.24)
GFR calc Af Amer: >60 mL/min (ref >60)
GFR calc non Af Amer: >60 mL/min (ref >60)
Glucose, Bld: 117 mg/dL — ABNORMAL HIGH (ref 70–99)
Potassium: 2.8 mmol/L — ABNORMAL LOW (ref 3.5–5.1)
Sodium: 141 mmol/L (ref 135–145)

## 2019-06-17 LAB — TROPONIN I (HIGH SENSITIVITY): Troponin I (High Sensitivity): 6472 ng/L (ref <18)

## 2019-06-17 LAB — CBC
HCT: 26.1 % — ABNORMAL LOW (ref 39.0–52.0)
Hemoglobin: 8.4 g/dL — ABNORMAL LOW (ref 13.0–17.0)
MCH: 27.7 pg (ref 26.0–34.0)
MCHC: 32.2 g/dL (ref 30.0–36.0)
MCV: 86.1 fL (ref 80.0–100.0)
Platelets: 136 10*3/uL — ABNORMAL LOW (ref 150–400)
RBC: 3.03 MIL/uL — ABNORMAL LOW (ref 4.22–5.81)
RDW: 15.7 % — ABNORMAL HIGH (ref 11.5–15.5)
WBC: 9.6 10*3/uL (ref 4.0–10.5)
nRBC: 0 % (ref 0.0–0.2)

## 2019-06-17 LAB — TRIGLYCERIDES: Triglycerides: 370 mg/dL — ABNORMAL HIGH (ref <150)

## 2019-06-17 LAB — MAGNESIUM: Magnesium: 2 mg/dL (ref 1.7–2.4)

## 2019-06-17 LAB — PHOSPHORUS: Phosphorus: 1.6 mg/dL — ABNORMAL LOW (ref 2.5–4.6)

## 2019-06-17 MED ORDER — ATORVASTATIN CALCIUM 80 MG PO TABS
80.0000 mg | ORAL_TABLET | Freq: Every day | ORAL | Status: DC
  Administered 2019-06-18 – 2019-06-20 (×3): 80 mg via ORAL
  Filled 2019-06-17 (×3): qty 1

## 2019-06-17 MED ORDER — ASPIRIN 81 MG PO CHEW
81.0000 mg | CHEWABLE_TABLET | Freq: Every day | ORAL | Status: DC
  Administered 2019-06-18 – 2019-06-20 (×3): 81 mg via ORAL
  Filled 2019-06-17 (×3): qty 1

## 2019-06-17 MED ORDER — LIDOCAINE 5 % EX PTCH
1.0000 | MEDICATED_PATCH | CUTANEOUS | Status: DC
  Administered 2019-06-17 – 2019-06-19 (×2): 1 via TRANSDERMAL
  Filled 2019-06-17 (×3): qty 1

## 2019-06-17 MED ORDER — DOCUSATE SODIUM 50 MG/5ML PO LIQD
100.0000 mg | Freq: Two times a day (BID) | ORAL | Status: DC | PRN
  Filled 2019-06-17: qty 10

## 2019-06-17 MED ORDER — FENTANYL CITRATE (PF) 100 MCG/2ML IJ SOLN
25.0000 ug | INTRAMUSCULAR | Status: DC | PRN
  Administered 2019-06-17 – 2019-06-18 (×8): 50 ug via INTRAVENOUS
  Filled 2019-06-17 (×8): qty 2

## 2019-06-17 MED ORDER — FUROSEMIDE 10 MG/ML IJ SOLN
60.0000 mg | Freq: Once | INTRAMUSCULAR | Status: AC
  Administered 2019-06-17: 60 mg via INTRAVENOUS
  Filled 2019-06-17: qty 6

## 2019-06-17 MED ORDER — TICAGRELOR 90 MG PO TABS
90.0000 mg | ORAL_TABLET | Freq: Two times a day (BID) | ORAL | Status: DC
  Administered 2019-06-17 – 2019-06-20 (×6): 90 mg via ORAL
  Filled 2019-06-17 (×6): qty 1

## 2019-06-17 MED ORDER — POLYETHYLENE GLYCOL 3350 17 G PO PACK: 17.0000 g | PACK | Freq: Every day | ORAL | Status: DC | PRN

## 2019-06-17 MED ORDER — FUROSEMIDE 10 MG/ML IJ SOLN: 40.0000 mg | Freq: Once | INTRAMUSCULAR | Status: DC

## 2019-06-17 MED ORDER — POTASSIUM CHLORIDE 10 MEQ/50ML IV SOLN
10.0000 meq | INTRAVENOUS | Status: AC
  Administered 2019-06-17 (×3): 10 meq via INTRAVENOUS
  Filled 2019-06-17 (×3): qty 50

## 2019-06-17 MED ORDER — ACETAMINOPHEN 325 MG PO TABS
650.0000 mg | ORAL_TABLET | ORAL | Status: DC | PRN
  Administered 2019-06-17 – 2019-06-18 (×3): 650 mg via ORAL
  Filled 2019-06-17 (×3): qty 2

## 2019-06-17 MED ORDER — FUROSEMIDE 10 MG/ML IJ SOLN
40.0000 mg | Freq: Two times a day (BID) | INTRAMUSCULAR | Status: DC
  Administered 2019-06-17 – 2019-06-19 (×4): 40 mg via INTRAVENOUS
  Filled 2019-06-17 (×4): qty 4

## 2019-06-17 MED ORDER — POTASSIUM CHLORIDE 10 MEQ/100ML IV SOLN
10.0000 meq | INTRAVENOUS | Status: AC
  Administered 2019-06-17 (×4): 10 meq via INTRAVENOUS
  Filled 2019-06-17 (×4): qty 100

## 2019-06-17 NOTE — Progress Notes (Addendum)
[facility] Note Patient Name: Raymond [name] DOB: [birth year] MRN: [id number]   Date of Service  06/17/2019  HPI/Events of Note  Patient c/o difficulty breathings. Extubated yesterday. Sat = 95% on 8 L.min HFNC. With RR = 26.   eICU Interventions  Plan: 1. Portable CXR STAT. 2. ABG STAT. 3. Check CVP. 4.  Will ask ground team to evaluate him at bedside.      Intervention Category Major Interventions: Hypoxemia - evaluation and management  Sherilee Smotherman Eugene 06/17/2019, 5:29 AM

## 2019-06-17 NOTE — Progress Notes (Signed)
Progress Note  Patient Name: Raymond [name] Date of Encounter: 06/17/2019  [facility] Cardiologist: No primary care provider on file.   Subjective   Wife at bedside.  Patient on BiPAP, short of breath.  Still some chest soreness from CPR.  Inpatient Medications    Scheduled Meds: . aspirin  81 mg Per Tube Daily  . atorvastatin  80 mg Per Tube Daily  . Chlorhexidine Gluconate Cloth  6 each Topical Daily  . enoxaparin (LOVENOX) injection  40 mg Subcutaneous Daily  . fentaNYL (SUBLIMAZE) injection  50 mcg Intravenous Once  . mouth rinse  15 mL Mouth Rinse BID  . sodium chloride flush  10-40 mL Intracatheter Q12H  . sodium chloride flush  3 mL Intravenous Q12H  . ticagrelor  90 mg Per Tube BID   Continuous Infusions: . sodium chloride    . sodium chloride 100 mL/hr at 06/15/19 0900  . sodium chloride    . sodium chloride    . amiodarone 30 mg/hr (06/17/19 1100)  . ampicillin-sulbactam (UNASYN) IV Stopped (06/17/19 0545)  . propofol (DIPRIVAN) infusion Stopped (06/16/19 1006)   PRN Meds: sodium chloride, Place/Maintain arterial line **AND** sodium chloride, Place/Maintain arterial line **AND** sodium chloride, acetaminophen, docusate, fentaNYL (SUBLIMAZE) injection, ondansetron (ZOFRAN) IV, polyethylene glycol, sodium chloride flush, sodium chloride flush   Vital Signs    Vitals:   06/17/19 0937 06/17/19 1000 06/17/19 1100 06/17/19 1153  BP: (!) 146/94 134/79 (!) 145/80   Pulse: 90 83 84 88  Resp: (!) 35 (!) 30 (!) 31 (!) 37  Temp:      TempSrc:      SpO2: 95% 99% 100% 100%  Weight:      Height:        Intake/Output Summary (Last 24 hours) at 06/17/2019 1226 Last data filed at 06/17/2019 1100 Gross per 24 hour  Intake 1587.95 ml  Output 4625 ml  Net -3037.05 ml   Last 3 Weights 06/16/2019 06/15/2019 06/14/2019  Weight (lbs) 183 lb 6.8 oz 179 lb 14.3 oz 180 lb  Weight (kg) 83.2 kg 81.6 kg 81.647 kg      Telemetry    Sinus rhythm- Personally Reviewed  Physical Exam   Alert, oriented, on BiPAP GEN:  Tachypneic Neck: No JVD Cardiac: RRR, no murmurs, rubs, or gallops.  Respiratory:  Inspiratory rales bilaterally. GI: Soft, nontender, non-distended  MS: No edema; No deformity. Neuro:  Nonfocal  Psych: Normal affect   Labs    High Sensitivity Troponin:   Recent Labs  Lab 06/14/19 1943 06/15/19 0109  TROPONINIHS 1,438* >27,000*      Chemistry Recent Labs  Lab 06/14/19 1943 06/14/19 1951 06/15/19 1450 06/15/19 1450 06/16/19 0424 06/16/19 0424 06/16/19 0724 06/17/19 0559 06/17/19 0629  NA 138   < > 139   < > 138   < > 142 143 141  K 2.5*   < > 4.2   < > 4.1   < > 3.7 2.7* 2.8*  CL 100   < > 108  --  107  --   --   --  108  CO2 18*   < > 17*  --  20*  --   --   --  24  GLUCOSE 294*   < > 129*  --  171*  --   --   --  117*  BUN 16   < > 16  --  11  --   --   --  7  CREATININE 1.67*   < >  1.28*  --  1.12  --   --   --  1.04  CALCIUM 8.5*   < > 7.3*  --  7.1*  --   --   --  7.6*  PROT 6.7  --   --   --   --   --   --   --   --   ALBUMIN 3.5  --   --   --   --   --   --   --   --   AST 168*  --   --   --   --   --   --   --   --   ALT 104*  --   --   --   --   --   --   --   --   ALKPHOS 37*  --   --   --   --   --   --   --   --   BILITOT 0.6  --   --   --   --   --   --   --   --   GFRNONAA 44*   < > >60  --  >60  --   --   --  >60  GFRAA 51*   < > >60  --  >60  --   --   --  >60  ANIONGAP 20*   < > 14  --  11  --   --   --  9   < > = values in this interval not displayed.     Hematology Recent Labs  Lab 06/16/19 0424 06/16/19 0724 06/16/19 1122 06/17/19 0413 06/17/19 0559  WBC 13.3*  --  10.5 9.6  --   RBC 3.63*  --  3.55* 3.03*  --   HGB 10.0*   < > 9.7* 8.4* 8.2*  HCT 31.5*   < > 30.7* 26.1* 24.0*  MCV 86.8  --  86.5 86.1  --   MCH 27.5  --  27.3 27.7  --   MCHC 31.7  --  31.6 32.2  --   RDW 16.0*  --  16.1* 15.7*  --   PLT 154  --  132* 136*  --    < > = values in this interval not displayed.    BNP Recent Labs    Lab 06/14/19 1943  BNP 40.2     DDimer No results for input(s): DDIMER in the last 168 hours.   Radiology    DG Chest Port 1 View  Result Date: 06/17/2019 CLINICAL DATA:  Respiratory failure EXAM: PORTABLE CHEST 1 VIEW COMPARISON:  Two days ago FINDINGS: Bilateral airspace disease asymmetric to the right. No Kerley lines. No history of hemoptysis. Cardiopericardial enlargement. Right IJ line with tip at the upper cavoatrial junction. No visible air leak. IMPRESSION: New, severe bilateral airspace disease which could be failure, pneumonia/ARDS, or aspiration. Electronically Signed   By: Monte Fantasia M.D.   On: 06/17/2019 09:16   EEG adult  Result Date: 06/15/2019 Lora Havens, MD     06/15/2019  4:33 PM Patient Name: Raymond [name] MRN: [id number] Epilepsy Attending: Lora Havens Referring Physician/Provider: Dr Mickel Baas Gleason, PA Date: 06/15/2019 Duration: 24.41 mins Patient history: 59yo M s/p cardiac arrest on TTM. EEG to evaluate for seizure. Level of alertness: comatose AEDs during EEG study: Propofol Technical aspects: This EEG study was done with scalp electrodes positioned according to the  10-20 International system of electrode placement. Electrical activity was acquired at a sampling rate of 500Hz  and reviewed with a high frequency filter of 70Hz  and a low frequency filter of 1Hz . EEG data were recorded continuously and digitally stored. Description: EEG showed continuous generalized 10-11 Hz alpha activity as well as intermittent generalized 2-3hz  delta slowing. EEG was reactive to verbal stimuli.  Hyperventilation and photic stimulation were not performed.   ABNORMALITY -Intermittent slow, generalized IMPRESSION: This study is suggestive of moderate to severe diffuse encephalopathy, nonspecific etiology.  No seizures or definite epileptiform discharges were seen throughout the recording. Priyanka Barbra Sarks   Overnight EEG with video  Result Date: 06/16/2019 Lora Havens, MD      06/16/2019  2:09 PM Patient Name: Raymond [name] MRN: [id number] Epilepsy Attending: Lora Havens Referring Physician/Provider: Dr Elease Etienne, PA Date: 06/15/2019 1503 to 06/16/2019 1147  Patient history: 59yo M s/p cardiac arrest on TTM. EEG to evaluate for seizure.  Level of alertness: awake, asleep  AEDs during EEG study: Propofol  Technical aspects: This EEG study was done with scalp electrodes positioned according to the 10-20 International system of electrode placement. Electrical activity was acquired at a sampling rate of 500Hz  and reviewed with a high frequency filter of 70Hz  and a low frequency filter of 1Hz . EEG data were recorded continuously and digitally stored.  Description: No clear posterior dominant was seen. Sleep was characterized by vertex waves, sleep spindles (12 to 14 Hz), maximal frontocentral region. EEG showed continuous generalized 8-9Hz  theta- alpha activity as well as intermittent generalized 3-5h theta- delta slowing. Hyperventilation and photic stimulation were not performed.    ABNORMALITY -Intermittent slow, generalized  IMPRESSION: This study is suggestive of moderate to severe diffuse encephalopathy, nonspecific etiology.  No seizures or definite epileptiform discharges were seen throughout the recording. EEG appears to be improving compared to previous day.  Lora Havens   ECHOCARDIOGRAM COMPLETE  Result Date: 06/15/2019    ECHOCARDIOGRAM REPORT   Patient Name:   DICKIE CLOE Date of Exam: 06/15/2019 Medical Rec #:  [id number]   Height:       69.0 in Accession #:    [id number]  Weight:       179.9 lb Date of Birth:  May 13, 1960   BSA:          1.975 m Patient Age:    4 years    BP:           131/62 mmHg Patient Gender: M           HR:           76 bpm. Exam Location:  Inpatient Procedure: 2D Echo, Cardiac Doppler and Color Doppler Indications:    Cardiac Arrest I46.9  History:        Patient has no prior history of Echocardiogram examinations.  Sonographer:    Merrie Roof RDCS Referring Phys: [place]  1. Left ventricular ejection fraction, by estimation, is 55 to 60%. The left ventricle has normal function. The left ventricle has no regional wall motion abnormalities. There is mild left ventricular hypertrophy. Left ventricular diastolic parameters are indeterminate.  2. Right ventricular systolic function is normal. The right ventricular size is normal.  3. The mitral valve is normal in structure. No evidence of mitral valve regurgitation. No evidence of mitral stenosis.  4. The aortic valve is tricuspid. Aortic valve regurgitation is not visualized. No aortic stenosis is present. FINDINGS  Left Ventricle: Left ventricular  ejection fraction, by estimation, is 55 to 60%. The left ventricle has normal function. The left ventricle has no regional wall motion abnormalities. The left ventricular internal cavity size was normal in size. There is  mild left ventricular hypertrophy. Left ventricular diastolic parameters are indeterminate. Right Ventricle: The right ventricular size is normal. No increase in right ventricular wall thickness. Right ventricular systolic function is normal. Left Atrium: Left atrial size was normal in size. Right Atrium: Right atrial size was normal in size. Pericardium: There is no evidence of pericardial effusion. Mitral Valve: The mitral valve is normal in structure. No evidence of mitral valve regurgitation. No evidence of mitral valve stenosis. Tricuspid Valve: The tricuspid valve is normal in structure. Tricuspid valve regurgitation is trivial. No evidence of tricuspid stenosis. Aortic Valve: The aortic valve is tricuspid. Aortic valve regurgitation is not visualized. No aortic stenosis is present. Aortic valve mean gradient measures 5.1 mmHg. Aortic valve peak gradient measures 10.0 mmHg. Aortic valve area, by VTI measures 1.98  cm. Pulmonic Valve: The pulmonic valve was not well visualized. Pulmonic valve regurgitation is  mild. No evidence of pulmonic stenosis. Aorta: The aortic root is normal in size and structure. Venous: IVC assessment for right atrial pressure unable to be performed due to mechanical ventilation. IAS/Shunts: No atrial level shunt detected by color flow Doppler.  LEFT VENTRICLE PLAX 2D LVIDd:         4.90 cm      Diastology LVIDs:         3.40 cm      LV e' lateral:   5.98 cm/s LV PW:         1.20 cm      LV E/e' lateral: 7.5 LV IVS:        1.20 cm      LV e' medial:    8.49 cm/s LVOT diam:     2.00 cm      LV E/e' medial:  5.3 LV SV:         43 LV SV Index:   22 LVOT Area:     3.14 cm  LV Volumes (MOD) LV vol d, MOD A2C: 77.1 ml LV vol d, MOD A4C: 127.0 ml LV vol s, MOD A2C: 37.2 ml LV vol s, MOD A4C: 47.6 ml LV SV MOD A2C:     39.9 ml LV SV MOD A4C:     127.0 ml LV SV MOD BP:      63.2 ml RIGHT VENTRICLE             IVC RV Basal diam:  3.40 cm     IVC diam: 2.10 cm RV S prime:     19.40 cm/s TAPSE (M-mode): 2.4 cm LEFT ATRIUM             Index       RIGHT ATRIUM           Index LA diam:        3.20 cm 1.62 cm/m  RA Area:     21.30 cm LA Vol (A2C):   81.0 ml 41.01 ml/m RA Volume:   61.80 ml  31.29 ml/m LA Vol (A4C):   51.2 ml 25.92 ml/m LA Biplane Vol: 69.5 ml 35.19 ml/m  AORTIC VALVE AV Area (Vmax):    1.83 cm AV Area (Vmean):   1.89 cm AV Area (VTI):     1.98 cm AV Vmax:           158.11 cm/s AV Vmean:  106.477 cm/s AV VTI:            0.218 m AV Peak Grad:      10.0 mmHg AV Mean Grad:      5.1 mmHg LVOT Vmax:         92.22 cm/s LVOT Vmean:        64.051 cm/s LVOT VTI:          0.137 m LVOT/AV VTI ratio: 0.63  AORTA Ao Root diam: 3.60 cm Ao Asc diam:  3.30 cm MITRAL VALVE MV Area (PHT): 3.12 cm    SHUNTS MV Decel Time: 243 msec    Systemic VTI:  0.14 m MV E velocity: 45.00 cm/s  Systemic Diam: 2.00 cm MV A velocity: 47.60 cm/s MV E/A ratio:  0.95 Carlyle Dolly MD Electronically signed by Carlyle Dolly MD Signature Date/Time: 06/15/2019/1:43:50 PM    Final     Cardiac Studies   2D  echocardiogram 06/15/2019: IMPRESSIONS    1. Left ventricular ejection fraction, by estimation, is 55 to 60%. The  left ventricle has normal function. The left ventricle has no regional  wall motion abnormalities. There is mild left ventricular hypertrophy.  Left ventricular diastolic parameters  are indeterminate.  2. Right ventricular systolic function is normal. The right ventricular  size is normal.  3. The mitral valve is normal in structure. No evidence of mitral valve  regurgitation. No evidence of mitral stenosis.  4. The aortic valve is tricuspid. Aortic valve regurgitation is not  visualized. No aortic stenosis is present.   Cardiac catheterization 06/14/2019: Conclusion    Post intervention, there is a 0% residual stenosis.  Prox Cx to Mid Cx lesion is 85% stenosed.  A stent was successfully placed.   Out of hospital cardiac arrest most likely due to transient occlusion of the proximal circumflex with reperfusion and evidence for  85% proximal stenosis with TIMI-3 flow.  Normal LAD and right coronary artery.  Preserved global LV contractility with LVEDP 10 mmHg.  Successful PCI to the proximal circumflex vessel with PTCA and ultimate insertion of a 3.5 x 15 mm Resolute DES stent with the stenosis being reduced to 0%.  RECOMMENDATION: DAPT for minimum of a year.  Hypothermia protocol will be instituted.  We will continue IV Cangrelor.  Initiate Brilinta via NG tube once the patient is in the CCU with discontinuance of Cangrelor 30 to 60 minutes post Brilinta administration.  We will continue bivalirudin for 2 hours post procedure.  2D echo Doppler study in a.m.   Patient Profile     59 y.o. male with out of hospital ventricular fibrillation cardiac arrest, now status post hypothermia and PCI of severe stenosis in the left circumflex  Assessment & Plan    1.  Out of hospital ventricular fibrillation cardiac arrest 2.  Non-STEMI, now status post PCI of the  left circumflex with a drug-eluting stent 3.  Acute respiratory failure with bilateral pulmonary infiltrates 4.  Hypokalemia  CVP is low, I suspect noncardiogenic pulmonary edema in this patient with preserved LV function.  Still reasonable to diurese as tolerated.  We will stop amiodarone as his heart rhythm remained stable and he is now 72 hours out from his arrest.  Replete potassium per CCM team.  We will follow with you.  Continue dual antiplatelet therapy and a high intensity statin drug.    For questions or updates, please contact [place] Please consult [email or web address] for contact info under  Signed, Sherren Mocha, MD  06/17/2019, 12:26 PM

## 2019-06-17 NOTE — Progress Notes (Signed)
BIPAP initiated due to increased WOB, diaphoretic, SOB per order.

## 2019-06-17 NOTE — Progress Notes (Signed)
Called to bedside for tachypnea, increased O2 req.   Patient appears comfortable, alert, but is actually breathing quite rapidly (30-40s) Satting in mid 90s on NRB 15l     CXR: B infiltrates, diffuse R>L, central.   I/O only slightly positive.   No increased WBC, HB lower compared to yesterday significantly. No bleeding.   ABG shows hypoxemia, no hypercarbia.   ARDS? Cardiogenic edema?  CXR shows clear reason for hypoxia, no need for CTA.    Lasix 60mg  IV, Trial of bipap to help with tachypnea, work of breathing, PEEP likely to help with interstitial edema.

## 2019-06-17 NOTE — Progress Notes (Signed)
[place] Progress Note Patient Name: Raymond [name] DOB: March 01, 1960 MRN: [id number]   Date of Service  06/17/2019  HPI/Events of Note  Patient c/o CPR pain.   eICU Interventions  Plan: 1. Fentanyl 25-50 mcg IV Q 2 hours PRN severe pain.      Intervention Category Major Interventions: Other:  Ismaeel Arvelo Cornelia Copa 06/17/2019, 4:02 AM

## 2019-06-17 NOTE — Progress Notes (Signed)
NAME:  Chung Chagoya, MRN:  [id number], DOB:  06-16-1960, LOS: 3 ADMISSION DATE:  06/14/2019, CONSULTATION DATE:  06/17/19 REFERRING MD:  EDP, CHIEF COMPLAINT:  Cardiac arrest   Brief History   59 year old male with prostate cancer currently undergoing chemotherapy who had seizure-like activity followed by V fib cardiac arrest. ROSC after defibrillation x5, epi x5 and total of 450 mg amiodarone and 150 mg lidocaine.  Intubated in the ED and PCCM consulted for admission.  Past Medical History  Prostate cancer, hypertension, remote stroke, remote drug use  Significant Hospital Events   6/5 Admit to PCCM, to Cath Lab and stenting of Cx 6/6 Hypothermia protocol stopped as he woke up. Developed hematuria > Underwent urethroscopy and balloon dilatation of bladder neck by urology 6/7-Following commands, Extubated  Consults:  Cardiology  Procedures:  6/5 ETT 6/5 cardiac cath- Prox Cx to Mid Cx lesion is 85% stenosed > stented  Significant Diagnostic Tests:  CT head 6/6-no acute intracranial abnormality  Echo 6/6-LVEF 55-60%, mild LVH, normal RV systolic function and size.  EEG 6/7-moderate- severe diffuse encephalopathy  Micro Data:  6/5 Covid-19>> pending  Antimicrobials:  Unasyn 6/6 >>  Interim history/subjective:   Extubated yesterday.  He did well overnight.  Today morning developed tachypnea, hypoxia with respiratory distress.  Worsening infiltrates on chest x-ray Placed on BiPAP and Lasix given.  Objective   Blood pressure (!) 146/94, pulse 90, temperature 99.3 F (37.4 C), temperature source Axillary, resp. rate (!) 35, height 5\' 9"  (1.753 m), weight 83.2 kg, SpO2 95 %. CVP:  [8 mmHg] 8 mmHg  FiO2 (%):  [80 %-100 %] 80 %   Intake/Output Summary (Last 24 hours) at 06/17/2019 1105 Last data filed at 06/17/2019 1000 Gross per 24 hour  Intake 1273.4 ml  Output 4560 ml  Net -3286.6 ml   Filed Weights   06/14/19 1933 06/15/19 0438 06/16/19 0500  Weight: 81.6 kg 81.6 kg 83.2  kg   Examination: Gen:      No acute distress HEENT:  EOMI, sclera anicteric Neck:     No masses; no thyromegaly Lungs:    Clear to auscultation bilaterally; normal respiratory effort CV:         Regular rate and rhythm; no murmurs Abd:      + bowel sounds; soft, non-tender; no palpable masses, no distension Ext:    No edema; adequate peripheral perfusion Skin:      Warm and dry; no rash Neuro: Somnolent, on BiPAP  Labs significant for  ABG 7.47/35/66/94% Potassium 2.8, creatinine 1.04 Hemoglobin slightly low at 8.4  Chest x-ray 6/8-worsening bilateral airspace disease  Resolved Hospital Problem list   AKI Hypokalemia NAGMA; lactic acidosis  Assessment & Plan:  Witnessed out of hospital V. fib cardiac arrest 2/2 NSTEMI, s/p DES to circumflex Cardiogenic shock resolved  Hypertension Continue telemetry monitoring, amiodarone Dual antiplatelet therapy  Acute hypoxic and hypercapnic respiratory failure due to cardiac arrest, likely RUL aspiration pneumonia Now with worsening hypoxic respiratory failure.  May be developing ARDS versus pulmonary edema Continue Unasyn Continue BiPAP.  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 Appreciate urology assistance.   Best practice:  Diet: N.p.o. Pain/Anxiety/Delirium protocol (if indicated): Propofol/fentanyl VAP protocol (if indicated): HOB 30 degrees suction as needed DVT prophylaxis: SCDs GI prophylaxis: Protonix Glucose control:  SSI Mobility: Bedrest Code Status: Full code Family Communication: Wife updated 6/7 Disposition: ICU  Critical care time:   The patient is critically ill with multiple organ system failure and requires high complexity decision making for assessment and support, frequent evaluation and titration of therapies, advanced monitoring, review of radiographic studies and interpretation of complex data.   Critical Care Time devoted to patient care services, exclusive of separately billable procedures, described in this note is 35 minutes.   Marshell Garfinkel MD  Pulmonary and Critical Care Please see [email or web address] for pager details.  06/17/2019, 11:05 AM

## 2019-06-18 ENCOUNTER — Ambulatory Visit: Payer: Medicaid Other

## 2019-06-18 ENCOUNTER — Inpatient Hospital Stay (HOSPITAL_COMMUNITY): Payer: Medicaid Other

## 2019-06-18 DIAGNOSIS — J9600 Acute respiratory failure, unspecified whether with hypoxia or hypercapnia: Secondary | ICD-10-CM

## 2019-06-18 LAB — CBC
HCT: 28.8 % — ABNORMAL LOW (ref 39.0–52.0)
Hemoglobin: 9.3 g/dL — ABNORMAL LOW (ref 13.0–17.0)
MCH: 27.4 pg (ref 26.0–34.0)
MCHC: 32.3 g/dL (ref 30.0–36.0)
MCV: 85 fL (ref 80.0–100.0)
Platelets: 166 10*3/uL (ref 150–400)
RBC: 3.39 MIL/uL — ABNORMAL LOW (ref 4.22–5.81)
RDW: 15.1 % (ref 11.5–15.5)
WBC: 10.7 10*3/uL — ABNORMAL HIGH (ref 4.0–10.5)
nRBC: 0 % (ref 0.0–0.2)

## 2019-06-18 LAB — BASIC METABOLIC PANEL
Anion gap: 11 (ref 5–15)
Anion gap: 12 (ref 5–15)
BUN: 7 mg/dL (ref 6–20)
BUN: 7 mg/dL (ref 6–20)
CO2: 26 mmol/L (ref 22–32)
CO2: 26 mmol/L (ref 22–32)
Calcium: 7.7 mg/dL — ABNORMAL LOW (ref 8.9–10.3)
Calcium: 8.3 mg/dL — ABNORMAL LOW (ref 8.9–10.3)
Chloride: 104 mmol/L (ref 98–111)
Chloride: 102 mmol/L (ref 98–111)
Creatinine, Ser: 1.09 mg/dL (ref 0.61–1.24)
Creatinine, Ser: 1.14 mg/dL (ref 0.61–1.24)
GFR calc Af Amer: >60 mL/min (ref >60)
GFR calc Af Amer: >60 mL/min (ref >60)
GFR calc non Af Amer: >60 mL/min (ref >60)
GFR calc non Af Amer: >60 mL/min (ref >60)
Glucose, Bld: 119 mg/dL — ABNORMAL HIGH (ref 70–99)
Glucose, Bld: 104 mg/dL — ABNORMAL HIGH (ref 70–99)
Potassium: 3 mmol/L — ABNORMAL LOW (ref 3.5–5.1)
Potassium: 3.2 mmol/L — ABNORMAL LOW (ref 3.5–5.1)
Sodium: 141 mmol/L (ref 135–145)
Sodium: 140 mmol/L (ref 135–145)

## 2019-06-18 LAB — MAGNESIUM: Magnesium: 1.9 mg/dL (ref 1.7–2.4)

## 2019-06-18 LAB — PHOSPHORUS
Phosphorus: 1.7 mg/dL — ABNORMAL LOW (ref 2.5–4.6)
Phosphorus: 2.4 mg/dL — ABNORMAL LOW (ref 2.5–4.6)

## 2019-06-18 MED ORDER — POTASSIUM CHLORIDE CRYS ER 20 MEQ PO TBCR
40.0000 meq | EXTENDED_RELEASE_TABLET | Freq: Once | ORAL | Status: AC
  Administered 2019-06-18: 40 meq via ORAL
  Filled 2019-06-18: qty 2

## 2019-06-18 MED ORDER — MAGNESIUM SULFATE 2 GM/50ML IV SOLN
2.0000 g | Freq: Once | INTRAVENOUS | Status: AC
  Administered 2019-06-18: 2 g via INTRAVENOUS
  Filled 2019-06-18: qty 50

## 2019-06-18 MED ORDER — POTASSIUM CHLORIDE CRYS ER 20 MEQ PO TBCR
20.0000 meq | EXTENDED_RELEASE_TABLET | Freq: Once | ORAL | Status: AC
  Administered 2019-06-18: 20 meq via ORAL
  Filled 2019-06-18: qty 1

## 2019-06-18 MED ORDER — METOPROLOL TARTRATE 12.5 MG HALF TABLET
12.5000 mg | ORAL_TABLET | Freq: Two times a day (BID) | ORAL | Status: DC
  Administered 2019-06-18 – 2019-06-19 (×4): 12.5 mg via ORAL
  Filled 2019-06-18 (×4): qty 1

## 2019-06-18 MED ORDER — POTASSIUM PHOSPHATES 15 MMOLE/5ML IV SOLN
30.0000 mmol | Freq: Once | INTRAVENOUS | Status: AC
  Administered 2019-06-18: 30 mmol via INTRAVENOUS
  Filled 2019-06-18: qty 10

## 2019-06-18 MED ORDER — POTASSIUM CHLORIDE CRYS ER 20 MEQ PO TBCR
20.0000 meq | EXTENDED_RELEASE_TABLET | ORAL | Status: AC
  Administered 2019-06-18 – 2019-06-19 (×2): 20 meq via ORAL
  Filled 2019-06-18 (×2): qty 1

## 2019-06-18 MED ORDER — POTASSIUM CHLORIDE 10 MEQ/100ML IV SOLN
10.0000 meq | INTRAVENOUS | Status: AC
  Administered 2019-06-19 (×3): 10 meq via INTRAVENOUS
  Filled 2019-06-18 (×4): qty 100

## 2019-06-18 MED ORDER — AMOXICILLIN-POT CLAVULANATE 875-125 MG PO TABS
1.0000 | ORAL_TABLET | Freq: Two times a day (BID) | ORAL | Status: DC
  Administered 2019-06-18 – 2019-06-20 (×5): 1 via ORAL
  Filled 2019-06-18 (×6): qty 1

## 2019-06-18 NOTE — Progress Notes (Signed)
   06/18/19 1626  Assess: MEWS Score  Temp 100.3 F (37.9 C)  BP 120/80  Pulse Rate 90  ECG Heart Rate 91  Resp (!) 50  Level of Consciousness Alert  SpO2 99 %  O2 Device Nasal Cannula  Assess: MEWS Score  MEWS Temp 0  MEWS Systolic 0  MEWS Pulse 0  MEWS RR 3  MEWS LOC 0  MEWS Score 3  MEWS Score Color Yellow  Assess: if the MEWS score is Yellow or Red  Were vital signs taken at a resting state? Yes  Focused Assessment Documented focused assessment  Early Detection of Sepsis Score *See Row Information* Medium  MEWS guidelines implemented *See Row Information* Yes  Treat  MEWS Interventions Administered scheduled meds/treatments  Take Vital Signs  Increase Vital Sign Frequency  Yellow: Q 2hr X 2 then Q 4hr X 2, if remains yellow, continue Q 4hrs  Escalate  MEWS: Escalate Yellow: discuss with charge nurse/RN and consider discussing with provider and RRT  Notify: Charge Nurse/RN  Name of Charge Nurse/RN Notified Carroll Kinds  Date Charge Nurse/RN Notified 06/18/19  Time Charge Nurse/RN Notified 1635

## 2019-06-18 NOTE — Progress Notes (Signed)
[place] Progress Note Patient Name: Raymond [name] DOB: 01-14-1960 MRN: [id number]   Date of Service  06/18/2019  HPI/Events of Note  K+ 3.2  eICU Interventions  Electrolyte Replacement  Protocol ordered for K+ and Mag.        Kerry Kass Uldine Fuster 06/18/2019, 10:38 PM

## 2019-06-18 NOTE — Progress Notes (Signed)
Progress Note  Patient Name: Raymond [name] Date of Encounter: 06/18/2019  [facility] Cardiologist: No primary care provider on file.   Subjective   Feeling better, breathing improved, still with some chest soreness, now on O2 per nasal cannula  Inpatient Medications    Scheduled Meds: . aspirin  81 mg Oral Daily  . atorvastatin  80 mg Oral Daily  . Chlorhexidine Gluconate Cloth  6 each Topical Daily  . enoxaparin (LOVENOX) injection  40 mg Subcutaneous Daily  . fentaNYL (SUBLIMAZE) injection  50 mcg Intravenous Once  . furosemide  40 mg Intravenous Q12H  . lidocaine  1 patch Transdermal Q24H  . mouth rinse  15 mL Mouth Rinse BID  . sodium chloride flush  10-40 mL Intracatheter Q12H  . sodium chloride flush  3 mL Intravenous Q12H  . ticagrelor  90 mg Oral BID   Continuous Infusions: . sodium chloride    . sodium chloride 100 mL/hr at 06/15/19 0900  . sodium chloride    . sodium chloride    . ampicillin-sulbactam (UNASYN) IV Stopped (06/18/19 0433)  . magnesium sulfate bolus IVPB 2 g (06/18/19 0544)  . potassium PHOSPHATE IVPB (in mmol)    . propofol (DIPRIVAN) infusion Stopped (06/16/19 1006)   PRN Meds: sodium chloride, Place/Maintain arterial line **AND** sodium chloride, Place/Maintain arterial line **AND** sodium chloride, acetaminophen, docusate, fentaNYL (SUBLIMAZE) injection, ondansetron (ZOFRAN) IV, polyethylene glycol, sodium chloride flush, sodium chloride flush   Vital Signs    Vitals:   06/18/19 0334 06/18/19 0400 06/18/19 0417 06/18/19 0500  BP:  (!) 124/95  133/71  Pulse:  92  90  Resp:  (!) 38  (!) 39  Temp: (!) 100.6 F (38.1 C)     TempSrc: Oral     SpO2:  94%  98%  Weight:   79.9 kg   Height:        Intake/Output Summary (Last 24 hours) at 06/18/2019 0547 Last data filed at 06/18/2019 0544 Gross per 24 hour  Intake 2354.34 ml  Output 6740 ml  Net -4385.66 ml   Last 3 Weights 06/18/2019 06/16/2019 06/15/2019  Weight (lbs) 176 lb 2.4 oz 183 lb  6.8 oz 179 lb 14.3 oz  Weight (kg) 79.9 kg 83.2 kg 81.6 kg      Telemetry    Sinus rhythm- Personally Reviewed   Physical Exam  Alert, oriented male GEN: No acute distress.   Neck: No JVD Cardiac: RRR, no murmurs, rubs, or gallops.  Respiratory: Clear to auscultation bilaterally.  Improved aeration GI: Soft, nontender, non-distended  MS: No edema; No deformity. Neuro:  Nonfocal  Psych: Normal affect   Labs    High Sensitivity Troponin:   Recent Labs  Lab 06/14/19 1943 06/15/19 0109 06/17/19 1654  TROPONINIHS 1,438* >27,000* 6,472*      Chemistry Recent Labs  Lab 06/14/19 1943 06/14/19 1951 06/17/19 0629 06/17/19 1654 06/18/19 0403  NA 138   < > 141 140 141  K 2.5*   < > 2.8* 2.7* 3.0*  CL 100   < > 108 103 104  CO2 18*   < > 24 27 26   GLUCOSE 294*   < > 117* 139* 119*  BUN 16   < > 7 7 7   CREATININE 1.67*   < > 1.04 1.10 1.09  CALCIUM 8.5*   < > 7.6* 8.0* 7.7*  PROT 6.7  --   --   --   --   ALBUMIN 3.5  --   --   --   --  AST 168*  --   --   --   --   ALT 104*  --   --   --   --   ALKPHOS 37*  --   --   --   --   BILITOT 0.6  --   --   --   --   GFRNONAA 44*   < > >60 >60 >60  GFRAA 51*   < > >60 >60 >60  ANIONGAP 20*   < > 9 10 11    < > = values in this interval not displayed.     Hematology Recent Labs  Lab 06/16/19 1122 06/16/19 1122 06/17/19 0413 06/17/19 0559 06/18/19 0403  WBC 10.5  --  9.6  --  10.7*  RBC 3.55*  --  3.03*  --  3.39*  HGB 9.7*   < > 8.4* 8.2* 9.3*  HCT 30.7*   < > 26.1* 24.0* 28.8*  MCV 86.5  --  86.1  --  85.0  MCH 27.3  --  27.7  --  27.4  MCHC 31.6  --  32.2  --  32.3  RDW 16.1*  --  15.7*  --  15.1  PLT 132*  --  136*  --  166   < > = values in this interval not displayed.    BNP Recent Labs  Lab 06/14/19 1943  BNP 40.2     DDimer No results for input(s): DDIMER in the last 168 hours.   Radiology    DG Chest Port 1 View  Result Date: 06/17/2019 CLINICAL DATA:  Respiratory failure EXAM: PORTABLE  CHEST 1 VIEW COMPARISON:  Two days ago FINDINGS: Bilateral airspace disease asymmetric to the right. No Kerley lines. No history of hemoptysis. Cardiopericardial enlargement. Right IJ line with tip at the upper cavoatrial junction. No visible air leak. IMPRESSION: New, severe bilateral airspace disease which could be failure, pneumonia/ARDS, or aspiration. Electronically Signed   By: Monte Fantasia M.D.   On: 06/17/2019 09:16   Overnight EEG with video  Result Date: 06/16/2019 Lora Havens, MD     06/16/2019  2:09 PM Patient Name: Raymond [name] MRN: [id number] Epilepsy Attending: Lora Havens Referring Physician/Provider: Dr Mickel Baas Gleason, PA Date: 06/15/2019 1503 to 06/16/2019 1147  Patient history: 59yo M s/p cardiac arrest on TTM. EEG to evaluate for seizure.  Level of alertness: awake, asleep  AEDs during EEG study: Propofol  Technical aspects: This EEG study was done with scalp electrodes positioned according to the 10-20 International system of electrode placement. Electrical activity was acquired at a sampling rate of 500Hz  and reviewed with a high frequency filter of 70Hz  and a low frequency filter of 1Hz . EEG data were recorded continuously and digitally stored.  Description: No clear posterior dominant was seen. Sleep was characterized by vertex waves, sleep spindles (12 to 14 Hz), maximal frontocentral region. EEG showed continuous generalized 8-9Hz  theta- alpha activity as well as intermittent generalized 3-5h theta- delta slowing. Hyperventilation and photic stimulation were not performed.    ABNORMALITY -Intermittent slow, generalized  IMPRESSION: This study is suggestive of moderate to severe diffuse encephalopathy, nonspecific etiology.  No seizures or definite epileptiform discharges were seen throughout the recording. EEG appears to be improving compared to previous day.  Lora Havens    Patient Profile     59 y.o. male with out of hospital ventricular fibrillation cardiac  arrest, now status post hypothermia and PCI of severe stenosis in the left circumflex  Assessment &  Plan    1.  Out of hospital ventricular fibrillation cardiac arrest 2.  Non-STEMI, now status post PCI of the left circumflex with a drug-eluting stent 3.  Acute respiratory failure with bilateral pulmonary infiltrates 4.  Hypokalemia  CVP remains <5. O2 sats>94% on high-flow O2 10 L/min. Hemodynamic stable. K 3.0 this am (imcreased from 2.7), receiving IV and PO potassium per CCM team.  Seems to be making slow progress.  Amiodarone discontinued yesterday with no arrhythmia on review of telemetry.  Chest x-ray personally reviewed, formal interpretation pending.  Bilateral pulmonary opacities persist but appear to be significantly improved from yesterday's exam.  Now that amiodarone is discontinued, will start metoprolol 12.5 mg twice daily.     For questions or updates, please contact [place] Please consult [email or web address] for contact info under        Signed, Sherren Mocha, MD  06/18/2019, 5:47 AM

## 2019-06-18 NOTE — Progress Notes (Signed)
E Link MD notified of patient's recent potassium result of 3.2.

## 2019-06-18 NOTE — Progress Notes (Signed)
Called [facility] and spoke with Ivin Booty, RN and made her aware of K+ 3.0 on this morning's labs. [facility] RN to address with MD.

## 2019-06-18 NOTE — Progress Notes (Signed)
NAME:  Raymond [name], MRN:  [id number], DOB:  1960-06-22, LOS: 4 ADMISSION DATE:  06/14/2019, CONSULTATION DATE:  06/18/19 REFERRING MD:  EDP, CHIEF COMPLAINT:  Cardiac arrest   Brief History   59 year old male with prostate cancer currently undergoing chemotherapy who had seizure-like activity followed by V fib cardiac arrest. ROSC after defibrillation x5, epi x5 and total of 450 mg amiodarone and 150 mg lidocaine.  Intubated in the ED and PCCM consulted for admission.  Past Medical History  Prostate cancer, hypertension, remote stroke, remote drug use  Significant Hospital Events   6/5 Admit to PCCM, to Cath Lab and stenting of Cx 6/6 Hypothermia protocol stopped as he woke up. Developed hematuria > Underwent urethroscopy and balloon dilatation of bladder neck by urology 6/7-Following commands, Extubated  Consults:  Cardiology  Procedures:  6/5 ETT > 6/7 6/5 cardiac cath- Prox Cx to Mid Cx lesion is 85% stenosed > stented  Significant Diagnostic Tests:  CT head 6/6-no acute intracranial abnormality  Echo 6/6-LVEF 55-60%, mild LVH, normal RV systolic function and size.  EEG 6/7-moderate- severe diffuse encephalopathy  Micro Data:  6/5 Covid-19>> pending  Antimicrobials:  Unasyn 6/6 >>  Interim history/subjective:  Lying in bed in no acute distress, states he feels well with no acute complaints. Wife updated at bedside   Objective   Blood pressure 137/81, pulse 93, temperature (!) 100.6 F (38.1 C), temperature source Oral, resp. rate (!) 24, height 5\' 9"  (1.753 m), weight 79.9 kg, SpO2 97 %. CVP:  [1 mmHg-5 mmHg] 2 mmHg  FiO2 (%):  [40 %-80 %] 40 %   Intake/Output Summary (Last 24 hours) at 06/18/2019 0839 Last data filed at 06/18/2019 0600 Gross per 24 hour  Intake 2120 ml  Output 7690 ml  Net -5570 ml   Filed Weights   06/15/19 0438 06/16/19 0500 06/18/19 0417  Weight: 81.6 kg 83.2 kg 79.9 kg   Examination: General: Very pleasant adult male lying in bed in  NAD HEENT: [place]/AT, MM pink/moist, PERRL, Sclera non-icteric  Neuro: Alert and oriented x3, non-focal  CV: s1s2 regular rate and rhythm, no murmur, rubs, or gallops,  PULM:  Clear to ascultation bilaterally, no increased work of breathing, oxygen saturations 95-100% on 10L HFNC, titrated down to 4LNC  GI: soft, bowel sounds active in all 4 quadrants, non-tender, non-distended Extremities: warm/dry, no edema  Skin: no rashes or lesions  Resolved Hospital Problem list   AKI Hypokalemia NAGMA; lactic acidosis Cardiogenic shock resolved   Assessment & Plan:  Witnessed out of hospital V. fib cardiac arrest 2/2 NSTEMI -s/p DES to circumflex Hypertension P: Cardiology following  Continuous telemetry  Continue DAPT Amiodarone stopped 6/8 Beta blocker therapy per cardiology    Acute hypoxic and hypercapnic respiratory failure due to cardiac arrest -Extubated 6/7 Likely RUL aspiration pneumonia -Seen with severe bilateral opacities on CXR 6/8 concern for aspirations vs pulmonary edema, placed on BIPAP and diuresed  P: Continue unasyn  Continue supplemental oxygen to maintain SPO2 > 92 BIPAP PRN Diureses as able  Follow CXR and ABG as needed Encourage pulmonary hygiene  Mobilize as able   Seizure-like activity -Upper body shaking lasted a few minutes per family, no prior history of seizures family does not believe there has been any metastatic spread of prostate cancer but no CT head in duplicate chart P: EEG negative as above  Frequent neuro checks  Seizure precautions  Minimize sedation   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 P: Appreciate urology assistance  Supportive care  Continue Foley cath, removal per urology   Deconditioned  P: Order PT/OT eval  Mobilize as able   Best practice:  Diet: Heart healthy   Pain/Anxiety/Delirium protocol (if indicated): PRNs VAP protocol (if indicated): HOB 30 degrees  DVT prophylaxis: SCDs GI prophylaxis: Protonix Glucose control: SSI Mobility: Bedrest Code Status: Full code Family Communication: Wife updated 6/7 Disposition: ICU  Labs   CBC: Recent Labs  Lab 06/14/19 1943 06/14/19 1951 06/15/19 0415 06/15/19 0548 06/16/19 0424 06/16/19 0424 06/16/19 0724 06/16/19 1122 06/17/19 0413 06/17/19 0559 06/18/19 0403  WBC 11.1*   < > 8.3  --  13.3*  --   --  10.5 9.6  --  10.7*  NEUTROABS 8.0*  --   --   --   --   --   --   --   --   --   --   HGB 14.4   < > 13.6   < > 10.0*   < > 15.0 9.7* 8.4* 8.2* 9.3*  HCT 46.5   < > 43.0   < > 31.5*   < > 44.0 30.7* 26.1* 24.0* 28.8*  MCV 87.7   < > 86.9  --  86.8  --   --  86.5 86.1  --  85.0  PLT 180   < > 170  --  154  --   --  132* 136*  --  166   < > = values in this interval not displayed.   Basic Metabolic Panel: Recent Labs  Lab 06/15/19 0109 06/15/19 0126 06/15/19 0415 06/15/19 4401 06/15/19 1450 06/15/19 1450 06/16/19 0424 06/16/19 0424 06/16/19 0724 06/17/19 0559 06/17/19 0629 06/17/19 1654 06/18/19 0403  NA 139   < > 136   < > 139   < > 138   < > 142 143 141 140 141  K 3.3*   < > 3.1*   < > 4.2   < > 4.1   < > 3.7 2.7* 2.8* 2.7* 3.0*  CL 107   < > 107   < > 108  --  107  --   --   --  108 103 104  CO2 20*   < > 19*   < > 17*  --  20*  --   --   --  24 27 26   GLUCOSE 143*   < > 168*   < > 129*  --  171*  --   --   --  117* 139* 119*  BUN 19   < > 20   < > 16  --  11  --   --   --  7 7 7   CREATININE 1.16   < > 1.28*   < > 1.28*  --  1.12  --   --   --  1.04 1.10 1.09  CALCIUM 7.9*   < > 7.6*   < > 7.3*  --  7.1*  --   --   --  7.6* 8.0* 7.7*  MG 2.7*  --  2.3  --   --   --   --   --   --   --  2.0  --  1.9  PHOS  --   --  3.2  --   --   --   --   --   --   --  1.6*  --  1.7*   < > = values in this interval not displayed.   GFR: Estimated Creatinine Clearance: 73 mL/min (by C-G  formula based on SCr of 1.09 mg/dL). Recent Labs  Lab 06/14/19 1947 06/15/19 0108 06/15/19 0415 06/16/19 0424 06/16/19 1122 06/17/19 0413 06/18/19 0403  WBC  --   --    < > 13.3* 10.5 9.6 10.7*  LATICACIDVEN 8.3* 1.8  --   --   --   --   --    < > = values in this interval not displayed.    Liver Function Tests: Recent Labs  Lab 06/14/19 1943  AST 168*  ALT 104*  ALKPHOS 37*  BILITOT 0.6  PROT 6.7  ALBUMIN 3.5   No results for input(s): LIPASE, AMYLASE in the last 168 hours. No results for input(s): AMMONIA in the last 168 hours.  ABG    Component Value Date/Time   PHART 7.467 (H) 06/17/2019 0559   PCO2ART 34.8 06/17/2019 0559   PO2ART 66 (L) 06/17/2019 0559   HCO3 25.1 06/17/2019 0559   TCO2 26 06/17/2019 0559   ACIDBASEDEF 1.0 06/16/2019 0724   O2SAT 94.0 06/17/2019 0559     Coagulation Profile: Recent Labs  Lab 06/14/19 1943 06/15/19 0109 06/15/19 0619  INR 1.0 1.9* 1.1    Cardiac Enzymes: No results for input(s): CKTOTAL, CKMB, CKMBINDEX, TROPONINI in the last 168 hours.  HbA1C: No results found for: HGBA1C  CBG: Recent Labs  Lab 06/15/19 2327 06/16/19 0424 06/16/19 0807 06/16/19 1120 06/16/19 1520  GLUCAP 184* 162* 135* 130* 116*    Critical care time:    Performed by: Johnsie Cancel  Total critical care time: 37 minutes  Critical care time was exclusive of separately billable procedures and treating other patients.  Critical care was necessary to treat or prevent imminent or life-threatening deterioration.  Critical care was time spent personally by me on the following activities: development of treatment plan with patient and/or surrogate as well as nursing, discussions with consultants, evaluation of patient's response to treatment, examination of patient, obtaining history from patient or surrogate, ordering and performing treatments and interventions, ordering and review of laboratory studies, ordering and review of radiographic  studies, pulse oximetry and re-evaluation of patient's condition.  Johnsie Cancel, NP-C [facility] Contact / Pager information can be found on Amion  06/18/2019, 8:50 AM

## 2019-06-18 NOTE — Progress Notes (Deleted)
PCCM Progress Note    Called to room as patient severely agitated after being repositioned in bed, on arrival 3 RNs were at bedside including spouse trying to keep patient in bed and safe. During agitation event patient was able to reach ETT tubing and self extubated. Post extubation patient was suctioned and placed on supplemental oxygen with appropriate oxygenation. Agitation improved, we will continued to monitor closely for need of reintubation.   Johnsie Cancel, NP-C [facility] Contact / Pager information can be found on Amion  06/18/2019, 9:37 AM

## 2019-06-19 ENCOUNTER — Ambulatory Visit: Payer: Medicaid Other

## 2019-06-19 ENCOUNTER — Encounter (HOSPITAL_COMMUNITY): Payer: Self-pay | Admitting: Internal Medicine

## 2019-06-19 DIAGNOSIS — I214 Non-ST elevation (NSTEMI) myocardial infarction: Principal | ICD-10-CM

## 2019-06-19 DIAGNOSIS — R5381 Other malaise: Secondary | ICD-10-CM

## 2019-06-19 DIAGNOSIS — Z8546 Personal history of malignant neoplasm of prostate: Secondary | ICD-10-CM

## 2019-06-19 DIAGNOSIS — I4901 Ventricular fibrillation: Secondary | ICD-10-CM

## 2019-06-19 DIAGNOSIS — R569 Unspecified convulsions: Secondary | ICD-10-CM

## 2019-06-19 DIAGNOSIS — R319 Hematuria, unspecified: Secondary | ICD-10-CM

## 2019-06-19 DIAGNOSIS — C61 Malignant neoplasm of prostate: Secondary | ICD-10-CM

## 2019-06-19 DIAGNOSIS — J690 Pneumonitis due to inhalation of food and vomit: Secondary | ICD-10-CM

## 2019-06-19 LAB — CBC
HCT: 32.6 % — ABNORMAL LOW (ref 39.0–52.0)
Hemoglobin: 10.4 g/dL — ABNORMAL LOW (ref 13.0–17.0)
MCH: 27.1 pg (ref 26.0–34.0)
MCHC: 31.9 g/dL (ref 30.0–36.0)
MCV: 84.9 fL (ref 80.0–100.0)
Platelets: 222 10*3/uL (ref 150–400)
RBC: 3.84 MIL/uL — ABNORMAL LOW (ref 4.22–5.81)
RDW: 15 % (ref 11.5–15.5)
WBC: 7.8 10*3/uL (ref 4.0–10.5)
nRBC: 0.3 % — ABNORMAL HIGH (ref 0.0–0.2)

## 2019-06-19 LAB — BASIC METABOLIC PANEL
Anion gap: 11 (ref 5–15)
BUN: 10 mg/dL (ref 6–20)
CO2: 26 mmol/L (ref 22–32)
Calcium: 8.4 mg/dL — ABNORMAL LOW (ref 8.9–10.3)
Chloride: 104 mmol/L (ref 98–111)
Creatinine, Ser: 1.14 mg/dL (ref 0.61–1.24)
GFR calc Af Amer: >60 mL/min (ref >60)
GFR calc non Af Amer: >60 mL/min (ref >60)
Glucose, Bld: 107 mg/dL — ABNORMAL HIGH (ref 70–99)
Potassium: 3.5 mmol/L (ref 3.5–5.1)
Sodium: 141 mmol/L (ref 135–145)

## 2019-06-19 LAB — MAGNESIUM: Magnesium: 2.5 mg/dL — ABNORMAL HIGH (ref 1.7–2.4)

## 2019-06-19 LAB — PHOSPHORUS: Phosphorus: 2.3 mg/dL — ABNORMAL LOW (ref 2.5–4.6)

## 2019-06-19 MED ORDER — POTASSIUM CHLORIDE 10 MEQ/100ML IV SOLN
10.0000 meq | INTRAVENOUS | Status: AC
  Administered 2019-06-19: 10 meq via INTRAVENOUS

## 2019-06-19 MED ORDER — POTASSIUM CHLORIDE CRYS ER 20 MEQ PO TBCR
40.0000 meq | EXTENDED_RELEASE_TABLET | Freq: Once | ORAL | Status: AC
  Administered 2019-06-19: 40 meq via ORAL
  Filled 2019-06-19: qty 2

## 2019-06-19 MED ORDER — FUROSEMIDE 40 MG PO TABS: 40.0000 mg | ORAL_TABLET | Freq: Every day | ORAL | Status: DC

## 2019-06-19 NOTE — Evaluation (Signed)
Speech Language Pathology Evaluation Patient Details Name: Raymond [name] MRN: [id number] DOB: 03-25-1960 Today's Date: 06/19/2019 Time: 4765-4650 SLP Time Calculation (min) (ACUTE ONLY): 22 min  Problem List:  Patient Active Problem List   Diagnosis Date Noted  . Acute respiratory failure ([place])   . Cardiac arrest ([place]) 06/14/2019  . AKI (acute kidney injury) ([place])   . Seizure [facility])    Past Medical History: History reviewed. No pertinent past medical history. Past Surgical History:  Past Surgical History:  Procedure Laterality Date  . CORONARY STENT INTERVENTION N/A 06/14/2019   Procedure: CORONARY STENT INTERVENTION;  Surgeon: [name], MD;  Location: [place] [facility];  Service: Cardiovascular;  Laterality: N/A;  . CORONARY/GRAFT ACUTE MI REVASCULARIZATION N/A 06/14/2019   Procedure: Coronary/Graft Acute MI Revascularization;  Surgeon: [name], MD;  Location: [place] [facility];  Service: Cardiovascular;  Laterality: N/A;  . CYSTOSCOPY WITH URETHRAL DILATATION N/A 06/15/2019   Procedure: CYSTOSCOPY WITH BALLOON URETHRAL DILATATION;  Surgeon: [name], MD;  Location: [place];  Service: Urology;  Laterality: N/A;  . LEFT HEART CATH AND CORONARY ANGIOGRAPHY N/A 06/14/2019   Procedure: LEFT HEART CATH AND CORONARY ANGIOGRAPHY;  Surgeon: [name], MD;  Location: [place] [facility];  Service: Cardiovascular;  Laterality: N/A;   HPI:  Pt is a 59 year old male with prostate cancer currently undergoing chemotherapy who had seizure-like activity followed by V fib cardiac arrest. ROSC after defibrillation x5, epi x5 and total of 450 mg amiodarone and 150 mg lidocaine.  Intubated 6/5-6/7. Ct head negative for acute changes. EEG 6/7: moderate- severe diffuse encephalopathy   Assessment / Plan / Recommendation Clinical Impression  Pt participated in speech/language/cognition evaluation with his wife present at the end of the evaluation. She stated that the pt is "mean" and when  asked about noted changes in cognition reiterated "he's just so mean". Pt denied any baseline deficits in speech, language, or cognition or any significant acute changes. [facility] Mental Status Examination was completed to evaluate the pt's cognitive-linguistic skills. He achieved a score of 25/30 which is below the normal limits of 27 or more out of 30 and is suggestive of a mild impairment. He exhibited difficulty in the areas of memory and executive function. However, he his cognition was within functional limits and he believes that he is at or very close to his baseline. He stated that he would like to defer treatment at this time and see how he performs when he returns home. He agreed that he will follow up with his PCP if he notices difficulty following discharge. Skilled SLP services will be discontinued at this time.     SLP Assessment  SLP Recommendation/Assessment: All further Speech Lanaguage Pathology  needs can be addressed in the next venue of care SLP Visit Diagnosis: Cognitive communication deficit (R41.841)    Follow Up Recommendations  None    Frequency and Duration           SLP Evaluation Cognition  Overall Cognitive Status: Impaired/Different from baseline Arousal/Alertness: Awake/alert Orientation Level: Oriented X4 Attention: Focused;Sustained Focused Attention: Appears intact Sustained Attention: Appears intact Memory: Impaired Memory Impairment: Storage deficit;Retrieval deficit;Decreased recall of new information (Immediate: 5/5; delayed: 2/5) Awareness: Impaired Awareness Impairment: Emergent impairment Problem Solving: Impaired Problem Solving Impairment: Verbal complex Executive Function: Reasoning;Sequencing;Organizing Reasoning: Appears intact Organizing: Impaired Organizing Impairment: Verbal complex (backward digit span: 3/3 with self-correction)       Comprehension  Auditory Comprehension Overall Auditory Comprehension: Appears  within functional limits for tasks assessed Yes/No Questions: Within Functional Limits Commands: Within Functional Limits Conversation: Complex    Expression Expression Primary Mode of Expression: Verbal Verbal Expression Overall Verbal Expression: Appears within functional limits for tasks assessed Initiation: No impairment Level of Generative/Spontaneous Verbalization: Conversation Repetition: No impairment Naming: No impairment Pragmatics: No impairment Written Expression Dominant Hand: Right   Oral / Motor  Oral Motor/Sensory Function Overall Oral Motor/Sensory Function: Within functional limits Motor Speech Overall Motor Speech: Appears within functional limits for tasks assessed Respiration: Within functional limits Phonation: Normal Resonance: Within functional limits Articulation: Within functional limitis Intelligibility: Intelligible Motor Planning: Witnin functional limits   Haniah Penny I. Hardin Negus, [place], [place] Office number [phone] Pager [place] 06/19/2019, 11:56 AM

## 2019-06-19 NOTE — Evaluation (Signed)
Physical Therapy Evaluation Patient Details Name: Raymond [name] MRN: [id number] DOB: 1960/11/12 Today's Date: 06/19/2019   History of Present Illness  59 year old male presented with seizure-like activity followed by V fib cardiac arrest and non-ST elevation MI s/p ETT in ED and cardiac cath on 6/5. Patient also found to have AKI and hyperkalemia. PMHx significant for prostate CA undergoing chemo, ETOH use, remote drug use and HTN.  Clinical Impression  PTA pt living alone in apartment with 2 steps to enter. Pt completely independent in mobility, ADLs, and iADLs. Pt is limited in safe mobility by increased oxygen demand (see General Comments), and chest pain with coughing in presence of generalized weakness and decreased balance. Pt is supervision for bed mobility and transfers and min guard for ambulation of 450 feet with RW. PT recommends HHPT at discharge to work on increased balance and endurance to return to PLOF. PT will continue to follow acutely.     Follow Up Recommendations Home health PT;Supervision for mobility/OOB    Equipment Recommendations  None recommended by PT       Precautions / Restrictions Precautions Precautions: None Restrictions Weight Bearing Restrictions: No      Mobility  Bed Mobility Overal bed mobility: Needs Assistance Bed Mobility: Supine to Sit     Supine to sit: Supervision     General bed mobility comments: able to longsit in bed without assist, requires assist for lines and leads  Transfers Overall transfer level: Needs assistance Equipment used: None Transfers: Sit to/from Stand;Stand Pivot Transfers Sit to Stand: Supervision         General transfer comment: supervision for safety. good power up and steadying in RW  Ambulation/Gait Ambulation/Gait assistance: Min guard Gait Distance (Feet): 450 Feet Assistive device: Rolling walker (2 wheeled) Gait Pattern/deviations: Step-through pattern;Decreased step length - right;Decreased  step length - left;WFL(Within Functional Limits) Gait velocity: slowed Gait velocity interpretation: 1.31 - 2.62 ft/sec, indicative of limited community ambulator General Gait Details: slow, mildly unsteady gait, no over LoB, 1x stop to brace chest with pillow to cough         Balance Overall balance assessment: Needs assistance Sitting-balance support: No upper extremity supported Sitting balance-Leahy Scale: Normal     Standing balance support: No upper extremity supported;During functional activity Standing balance-Leahy Scale: Good                               Pertinent Vitals/Pain Pain Assessment: Faces Pain Location: sternum with coughing (In chest with coughing) Pain Descriptors / Indicators: Sharp;Grimacing;Guarding Pain Intervention(s): Limited activity within patient's tolerance;Monitored during session;Repositioned    Home Living Family/patient expects to be discharged to:: Private residence Living Arrangements: Alone Available Help at Discharge: Family;Available 24 hours/day Type of Home: Apartment Home Access: Stairs to enter Entrance Stairs-Rails: Can reach both Entrance Stairs-Number of Steps: 2 Home Layout: One level Home Equipment: Walker - 2 wheels;Grab bars - toilet;Cane - single point;Hand held shower head (Rollator) Additional Comments: Patient was an independent ambulator    Prior Function Level of Independence: Independent               Hand Dominance   Dominant Hand: Right    Extremity/Trunk Assessment   Upper Extremity Assessment Upper Extremity Assessment: Overall WFL for tasks assessed    Lower Extremity Assessment Lower Extremity Assessment: Overall WFL for tasks assessed       Communication   Communication: No difficulties  Cognition Arousal/Alertness: Awake/alert Behavior During  Therapy: WFL for tasks assessed/performed Overall Cognitive Status: Within Functional Limits for tasks assessed                                         General Comments General comments (skin integrity, edema, etc.): Pt on 2L O2 via [place] on entry with SaO2 96%O2, removed McIntyre and Sao2 dropped to 88%O2 replaced supplemental O2 for ambulation and able to maintain SaO2 > 92%O2, max HR 110 bpm         Assessment/Plan    PT Assessment Patient needs continued PT services  PT Problem List Decreased activity tolerance;Decreased balance;Decreased mobility;Cardiopulmonary status limiting activity;Pain       PT Treatment Interventions DME instruction;Gait training;Stair training;Functional mobility training;Therapeutic activities;Therapeutic exercise;Balance training;Cognitive remediation;Patient/family education    PT Goals (Current goals can be found in the Care Plan section)  Acute Rehab PT Goals Patient Stated Goal: To return home PT Goal Formulation: With patient Time For Goal Achievement: 07/03/19 Potential to Achieve Goals: Good    Frequency Min 3X/week    AM-PAC PT "6 Clicks" Mobility  Outcome Measure Help needed turning from your back to your side while in a flat bed without using bedrails?: None Help needed moving from lying on your back to sitting on the side of a flat bed without using bedrails?: None Help needed moving to and from a bed to a chair (including a wheelchair)?: A Little Help needed standing up from a chair using your arms (e.g., wheelchair or bedside chair)?: A Little Help needed to walk in hospital room?: A Little Help needed climbing 3-5 steps with a railing? : A Lot 6 Click Score: 19    End of Session Equipment Utilized During Treatment: Gait belt;Oxygen Activity Tolerance: Patient tolerated treatment well Patient left: with call bell/phone within reach;with family/visitor present (sitting EoB) Nurse Communication: Mobility status PT Visit Diagnosis: Unsteadiness on feet (R26.81);Other abnormalities of gait and mobility (R26.89);Muscle weakness (generalized)  (M62.81);Difficulty in walking, not elsewhere classified (R26.2);Pain Pain - part of body:  (sternum)    Time: 7867-6720 PT Time Calculation (min) (ACUTE ONLY): 40 min   Charges:   PT Evaluation $PT Eval Moderate Complexity: 1 Mod PT Treatments $Gait Training: 23-37 mins        Gayla Benn B. Migdalia Dk PT, DPT Acute Rehabilitation Services Pager [phone] Office [phone]   [place] 06/19/2019, 5:30 PM

## 2019-06-19 NOTE — Progress Notes (Signed)
14:10 - 14:28  Accessed patient for readiness to learn for NSTEMI/PCI education. Not ready at this time. Spouse at side. I did provide her with an MI booklet and we discussed the importance of her spouse carrying the stent card. She has the card in her possession and has already photographed it. C/R will f/u with patient/spouse to provide education when mentation improves.   Lesly Rubenstein MS, ACSM-EP-C, CCRP

## 2019-06-19 NOTE — Progress Notes (Signed)
Progress Note  Patient Name: Raymond [name] Date of Encounter: 06/19/2019  [facility] Cardiologist: No primary care provider on file.   Subjective   Doing ok. No CP or dyspnea this morning. Wife at bedside - concerned about him.  Inpatient Medications    Scheduled Meds: . amoxicillin-clavulanate  1 tablet Oral Q12H  . aspirin  81 mg Oral Daily  . atorvastatin  80 mg Oral Daily  . Chlorhexidine Gluconate Cloth  6 each Topical Daily  . enoxaparin (LOVENOX) injection  40 mg Subcutaneous Daily  . furosemide  40 mg Intravenous Q12H  . lidocaine  1 patch Transdermal Q24H  . mouth rinse  15 mL Mouth Rinse BID  . metoprolol tartrate  12.5 mg Oral BID  . potassium chloride  40 mEq Oral Once  . sodium chloride flush  10-40 mL Intracatheter Q12H  . sodium chloride flush  3 mL Intravenous Q12H  . ticagrelor  90 mg Oral BID   Continuous Infusions:  PRN Meds: acetaminophen, docusate, ondansetron (ZOFRAN) IV, polyethylene glycol, sodium chloride flush, sodium chloride flush   Vital Signs    Vitals:   06/19/19 0020 06/19/19 0357 06/19/19 0412 06/19/19 0807  BP: 115/68 132/89  124/86  Pulse: 83 86  94  Resp: (!) 35 (!) 39  (!) 48  Temp: 99.8 F (37.7 C) 99.5 F (37.5 C)  99.5 F (37.5 C)  TempSrc: Oral Oral  Oral  SpO2: 93% 93%  96%  Weight:   76.3 kg   Height:        Intake/Output Summary (Last 24 hours) at 06/19/2019 1025 Last data filed at 06/19/2019 0850 Gross per 24 hour  Intake 1728.23 ml  Output 4525 ml  Net -2796.77 ml   Last 3 Weights 06/19/2019 06/18/2019 06/16/2019  Weight (lbs) 168 lb 3.2 oz 176 lb 2.4 oz 183 lb 6.8 oz  Weight (kg) 76.295 kg 79.9 kg 83.2 kg      Telemetry    Sinus rhythm/sinus tach - Personally Reviewed   Physical Exam  Alert, oriented, in NAD GEN: No acute distress.   Neck: No JVD Cardiac: RRR, no murmurs, rubs, or gallops.  Respiratory: fine crackles bilaterally, good air movement. GI: Soft, nontender, non-distended  MS: No edema;  No deformity. Neuro:  Nonfocal  Psych: Normal affect   Labs    High Sensitivity Troponin:   Recent Labs  Lab 06/14/19 1943 06/15/19 0109 06/17/19 1654  TROPONINIHS 1,438* >27,000* 6,472*      Chemistry Recent Labs  Lab 06/14/19 1943 06/14/19 1951 06/18/19 0403 06/18/19 2017 06/19/19 0359  NA 138   < > 141 140 141  K 2.5*   < > 3.0* 3.2* 3.5  CL 100   < > 104 102 104  CO2 18*   < > 26 26 26   GLUCOSE 294*   < > 119* 104* 107*  BUN 16   < > 7 7 10   CREATININE 1.67*   < > 1.09 1.14 1.14  CALCIUM 8.5*   < > 7.7* 8.3* 8.4*  PROT 6.7  --   --   --   --   ALBUMIN 3.5  --   --   --   --   AST 168*  --   --   --   --   ALT 104*  --   --   --   --   ALKPHOS 37*  --   --   --   --   BILITOT 0.6  --   --   --   --  GFRNONAA 44*   < > >60 >60 >60  GFRAA 51*   < > >60 >60 >60  ANIONGAP 20*   < > 11 12 11    < > = values in this interval not displayed.     Hematology Recent Labs  Lab 06/17/19 0413 06/17/19 0413 06/17/19 0559 06/18/19 0403 06/19/19 0359  WBC 9.6  --   --  10.7* 7.8  RBC 3.03*  --   --  3.39* 3.84*  HGB 8.4*   < > 8.2* 9.3* 10.4*  HCT 26.1*   < > 24.0* 28.8* 32.6*  MCV 86.1  --   --  85.0 84.9  MCH 27.7  --   --  27.4 27.1  MCHC 32.2  --   --  32.3 31.9  RDW 15.7*  --   --  15.1 15.0  PLT 136*  --   --  166 222   < > = values in this interval not displayed.    BNP Recent Labs  Lab 06/14/19 1943  BNP 40.2     DDimer No results for input(s): DDIMER in the last 168 hours.   Radiology    DG Chest Port 1 View  Result Date: 06/18/2019 CLINICAL DATA:  Respiratory failure EXAM: PORTABLE CHEST 1 VIEW COMPARISON:  06/17/2019 FINDINGS: Cardiac shadow is stable. Right jugular central line is again seen and stable. Diffuse bilateral parenchymal opacities are noted similar to that seen on the prior exam. No sizable effusion or pneumothorax is noted. IMPRESSION: Stable bilateral parenchymal opacities. No new focal abnormality is seen. Electronically Signed    By: Inez Catalina M.D.   On: 06/18/2019 09:58    Cardiac Studies   Echo: IMPRESSIONS    1. Left ventricular ejection fraction, by estimation, is 55 to 60%. The  left ventricle has normal function. The left ventricle has no regional  wall motion abnormalities. There is mild left ventricular hypertrophy.  Left ventricular diastolic parameters  are indeterminate.  2. Right ventricular systolic function is normal. The right ventricular  size is normal.  3. The mitral valve is normal in structure. No evidence of mitral valve  regurgitation. No evidence of mitral stenosis.  4. The aortic valve is tricuspid. Aortic valve regurgitation is not  visualized. No aortic stenosis is present.  Cath: Conclusion    Post intervention, there is a 0% residual stenosis.  Prox Cx to Mid Cx lesion is 85% stenosed.  A stent was successfully placed.   Out of hospital cardiac arrest most likely due to transient occlusion of the proximal circumflex with reperfusion and evidence for  85% proximal stenosis with TIMI-3 flow.  Normal LAD and right coronary artery.  Preserved global LV contractility with LVEDP 10 mmHg.  Successful PCI to the proximal circumflex vessel with PTCA and ultimate insertion of a 3.5 x 15 mm Resolute DES stent with the stenosis being reduced to 0%.  RECOMMENDATION: DAPT for minimum of a year.  Hypothermia protocol will be instituted.  We will continue IV Cangrelor.  Initiate Brilinta via NG tube once the patient is in the CCU with discontinuance of Cangrelor 30 to 60 minutes post Brilinta administration.  We will continue bivalirudin for 2 hours post procedure.  2D echo Doppler study in a.m.  Patient Profile     59 y.o. male with out of hospital ventricular fibrillation cardiac arrest, now status post hypothermia and PCI of severe stenosis in the left circumflex  Assessment & Plan    1. VF arrest, witnessed -  out of hospital 2. NSTEMI - s/p PCI of the LCx 3. Acute  respiratory failure with BL pulmonary infiltrates - suspect non-cardiogenic 4. Hypokalemia - improving 5. 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 He is answering questions and responding appropriately on my exam.  Recommend changing lasix to 40 mg PO daily. His CVP has been low and we are at risk of over-diuresing him. With normal LV function and low CVP, I think his pulmonary issues are related to lung injury/ARDS rather than cardiogenic pulmonary edema. Cardiac meds appropriate - reviewed today.  For questions or updates, please contact [place] Please consult [email or web address] for contact info under        Signed, Sherren Mocha, MD  06/19/2019, 10:25 AM

## 2019-06-19 NOTE — Progress Notes (Addendum)
PROGRESS NOTE  Raymond [name] HCW:[id number] DOB: 1960-06-30   PCP: Gevena Mart, DO  Patient is from: home  DOA: 06/14/2019 LOS: 5  Brief Narrative / Interim history: 59 year old male with history of prostate cancer on chemotherapy, hypertension, remote stroke and remote drug use who had seizure-like activity followed by V. fib cardiac arrest. ROSC after defibrillation x5, epi x5 and total of 450 mg amiodarone and 150 mg lidocaine.  Intubated in the ED. He went straight for cardiac cath that revealed 85% stenosis at proximal to mid circumflex that was stented.  Patient was intubated 6/5-6/7. Echocardiogram with LVEF of 55 to 60%, mild LVH and normal RVSP.  EEG on 6/7 with moderate to severe diffuse encephalopathy but no epileptiform discharge.    Hospital course complicated by aspiration pneumonia and hematuria.  He was started on Unasyn and transitioned to p.o. Augmentin.  He underwent ureteroscopy and balloon dilation of the bladder neck by urology on 6/6.  He was transferred to [facility] hospitalist service on 06/19/2019.  Cardiology following.  Subjective: Seen and examined earlier this morning.  No major events overnight of this morning.  Reports intermittent cough with productive phlegm.  Reports chest pain with cough.  Denies shortness of breath, GI or UTI symptoms.  Objective: Vitals:   06/19/19 0357 06/19/19 0412 06/19/19 0807 06/19/19 1000  BP: 132/89  124/86 122/87  Pulse: 86  94 94  Resp: (!) 39  (!) 48   Temp: 99.5 F (37.5 C)  99.5 F (37.5 C)   TempSrc: Oral  Oral   SpO2: 93%  96%   Weight:  76.3 kg    Height:        Intake/Output Summary (Last 24 hours) at 06/19/2019 1250 Last data filed at 06/19/2019 0850 Gross per 24 hour  Intake 1272.45 ml  Output 3750 ml  Net -2477.55 ml   Filed Weights   06/16/19 0500 06/18/19 0417 06/19/19 0412  Weight: 83.2 kg 79.9 kg 76.3 kg    Examination:  GENERAL: No apparent distress.  Nontoxic. HEENT: MMM.  Vision and hearing  grossly intact.  NECK: Supple.  No apparent JVD.  RESP:  No IWOB.  Fair aeration bilaterally. CVS:  RRR. Heart sounds normal.  ABD/GI/GU: BS+. Abd soft, NTND.  MSK/EXT:  Moves extremities. No apparent deformity. No edema.  SKIN: no apparent skin lesion or wound NEURO: Awake, alert and oriented appropriately.  No apparent focal neuro deficit. PSYCH: Calm. Normal affect.  Procedures:  6/5-6/7-ETT 6/5 cardiac cath- Prox Cx to Mid Cx lesion is 85% stenosed > stented 6/6-urethroscopy and balloon dilatation of bladder neck by urology  Microbiology summarized: COVID-19 PCR negative. MRSA PCR negative.  Assessment & Plan: Witnessed out of hospital V. fib cardiac arrest -ROSC after defibrillation x5, epi x5 and total of 450 mg amiodarone and 150 mg lidocaine.   -Off amiodarone as of 6/8.  Non-STEMI -Cardiac cath that revealed 85% stenosis at proximal to mid circumflex that was stented -On DAPT, beta-blocker and telemetry monitoring  Acute respiratory failure with hypoxia and hypercarbia in the setting of cardiac arrest and possible aspiration -ETT 6/5-6/7 -P.o. Lasix 40 mg daily -Incentive spirometry -Wean oxygen as able. -OOB and incentive spirometry  Aspiration pneumonia -Continue p.o. Augmentin through 6/13  Seizure-like activity -EEG with encephalopathy but no epileptiform discharge.  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 -We will discuss with urology about ongoing Foley catheter need  Addendum -Discussed with urology, Dr. Gilford Rile who recommended keeping the Foley and outpatient follow-up with Dr. Claudia Desanctis  Debility/physical deconditioning -PT/OT eval             DVT prophylaxis: Subcu Lovenox Code Status: Full code Family Communication: Updated patient's wife at bedside Status is: Inpatient  Remains inpatient appropriate  because:Inpatient level of care appropriate due to severity of illness   Dispo: The patient is from: Home              Anticipated d/c is to: Home              Anticipated d/c date is: 1 day              Patient currently is not medically stable to d/c.       Consultants:  Cardiology PCCM Urology   Sch Meds:  Scheduled Meds: . amoxicillin-clavulanate  1 tablet Oral Q12H  . aspirin  81 mg Oral Daily  . atorvastatin  80 mg Oral Daily  . Chlorhexidine Gluconate Cloth  6 each Topical Daily  . enoxaparin (LOVENOX) injection  40 mg Subcutaneous Daily  . [START ON 06/20/2019] furosemide  40 mg Oral Daily  . lidocaine  1 patch Transdermal Q24H  . mouth rinse  15 mL Mouth Rinse BID  . metoprolol tartrate  12.5 mg Oral BID  . sodium chloride flush  10-40 mL Intracatheter Q12H  . sodium chloride flush  3 mL Intravenous Q12H  . ticagrelor  90 mg Oral BID   Continuous Infusions: . potassium chloride 10 mEq (06/19/19 0401)   PRN Meds:.acetaminophen, docusate, ondansetron (ZOFRAN) IV, polyethylene glycol, sodium chloride flush, sodium chloride flush  Antimicrobials: Anti-infectives (From admission, onward)   Start     Dose/Rate Route Frequency Ordered Stop   06/18/19 1200  amoxicillin-clavulanate (AUGMENTIN) 875-125 MG per tablet 1 tablet     Discontinue     1 tablet Oral Every 12 hours 06/18/19 1042 06/22/19 0959   06/15/19 1045  Ampicillin-Sulbactam (UNASYN) 3 g in sodium chloride 0.9 % 100 mL IVPB  Status:  Discontinued        3 g 200 mL/hr over 30 Minutes Intravenous Every 6 hours 06/15/19 1041 06/18/19 1042       I have personally reviewed the following labs and images: CBC: Recent Labs  Lab 06/14/19 1943 06/14/19 1951 06/16/19 0424 06/16/19 0724 06/16/19 1122 06/17/19 0413 06/17/19 0559 06/18/19 0403 06/19/19 0359  WBC 11.1*   < > 13.3*  --  10.5 9.6  --  10.7* 7.8  NEUTROABS 8.0*  --   --   --   --   --   --   --   --   HGB 14.4   < > 10.0*   < > 9.7* 8.4*  8.2* 9.3* 10.4*  HCT 46.5   < > 31.5*   < > 30.7* 26.1* 24.0* 28.8* 32.6*  MCV 87.7   < > 86.8  --  86.5 86.1  --  85.0 84.9  PLT 180   < > 154  --  132* 136*  --  166 222   < > = values in this interval not displayed.   BMP &GFR Recent Labs  Lab 06/15/19 0109 06/15/19 0126 06/15/19 0415 06/15/19 2694 06/17/19 0629 06/17/19 1654 06/18/19 0403 06/18/19 2017 06/19/19 0359  NA 139   < > 136   < > 141 140 141 140 141  K 3.3*   < > 3.1*   < >  2.8* 2.7* 3.0* 3.2* 3.5  CL 107   < > 107   < > 108 103 104 102 104  CO2 20*   < > 19*   < > 24 27 26 26 26   GLUCOSE 143*   < > 168*   < > 117* 139* 119* 104* 107*  BUN 19   < > 20   < > 7 7 7 7 10   CREATININE 1.16   < > 1.28*   < > 1.04 1.10 1.09 1.14 1.14  CALCIUM 7.9*   < > 7.6*   < > 7.6* 8.0* 7.7* 8.3* 8.4*  MG 2.7*  --  2.3  --  2.0  --  1.9  --  2.5*  PHOS  --   --  3.2  --  1.6*  --  1.7* 2.4* 2.3*   < > = values in this interval not displayed.   Estimated Creatinine Clearance: 69.8 mL/min (by C-G formula based on SCr of 1.14 mg/dL). Liver & Pancreas: Recent Labs  Lab 06/14/19 1943  AST 168*  ALT 104*  ALKPHOS 37*  BILITOT 0.6  PROT 6.7  ALBUMIN 3.5   No results for input(s): LIPASE, AMYLASE in the last 168 hours. No results for input(s): AMMONIA in the last 168 hours. Diabetic: No results for input(s): HGBA1C in the last 72 hours. Recent Labs  Lab 06/15/19 2327 06/16/19 0424 06/16/19 0807 06/16/19 1120 06/16/19 1520  GLUCAP 184* 162* 135* 130* 116*   Cardiac Enzymes: No results for input(s): CKTOTAL, CKMB, CKMBINDEX, TROPONINI in the last 168 hours. No results for input(s): PROBNP in the last 8760 hours. Coagulation Profile: Recent Labs  Lab 06/14/19 1943 06/15/19 0109 06/15/19 0619  INR 1.0 1.9* 1.1   Thyroid Function Tests: No results for input(s): TSH, T4TOTAL, FREET4, T3FREE, THYROIDAB in the last 72 hours. Lipid Profile: Recent Labs    06/17/19 0629  TRIG 370*   Anemia Panel: No results for  input(s): VITAMINB12, FOLATE, FERRITIN, TIBC, IRON, RETICCTPCT in the last 72 hours. Urine analysis:    Component Value Date/Time   COLORURINE YELLOW 06/14/2019 1955   APPEARANCEUR CLOUDY (A) 06/14/2019 1955   LABSPEC 1.011 06/14/2019 1955   PHURINE 8.0 06/14/2019 1955   GLUCOSEU >=500 (A) 06/14/2019 1955   HGBUR MODERATE (A) 06/14/2019 1955   BILIRUBINUR NEGATIVE 06/14/2019 [place] NEGATIVE 06/14/2019 1955   PROTEINUR >=300 (A) 06/14/2019 1955   NITRITE NEGATIVE 06/14/2019 1955   LEUKOCYTESUR NEGATIVE 06/14/2019 1955   Sepsis Labs: Invalid input(s): PROCALCITONIN, [place]  Microbiology: Recent Results (from the past 240 hour(s))  SARS Coronavirus 2 by RT PCR (hospital order, performed in [facility] hospital lab) Nasopharyngeal Nasopharyngeal Swab     Status: None   Collection Time: 06/14/19  8:15 PM   Specimen: Nasopharyngeal Swab  Result Value Ref Range Status   SARS Coronavirus 2 NEGATIVE NEGATIVE Final    Comment: (NOTE) SARS-CoV-2 target nucleic acids are NOT DETECTED. The SARS-CoV-2 RNA is generally detectable in upper and lower respiratory specimens during the acute phase of infection. The lowest concentration of SARS-CoV-2 viral copies this assay can detect is 250 copies / mL. A negative result does not preclude SARS-CoV-2 infection and should not be used as the sole basis for treatment or other patient management decisions.  A negative result may occur with improper specimen collection / handling, submission of specimen other than nasopharyngeal swab, presence of viral mutation(s) within the areas targeted by this assay, and inadequate number of viral  copies (<250 copies / mL). A negative result must be combined with clinical observations, patient history, and epidemiological information. Fact Sheet for Patients:   StrictlyIdeas.no Fact Sheet for Healthcare Providers: BankingDealers.co.za This test is not  yet approved or cleared  by the [place] FDA and has been authorized for detection and/or diagnosis of SARS-CoV-2 by FDA under an Emergency Use Authorization (EUA).  This EUA will remain in effect (meaning this test can be used) for the duration of the COVID-19 declaration under Section 564(b)(1) of the Act, 21 U.S.C. section 360bbb-3(b)(1), unless the authorization is terminated or revoked sooner. Performed at [facility], [place] [address] [place], [place] [address]   MRSA PCR Screening     Status: None   Collection Time: 06/14/19 10:53 PM   Specimen: Nasopharyngeal  Result Value Ref Range Status   MRSA by PCR NEGATIVE NEGATIVE Final    Comment:        The GeneXpert MRSA Assay (FDA approved for NASAL specimens only), is one component of a comprehensive MRSA colonization surveillance program. It is not intended to diagnose MRSA infection nor to guide or monitor treatment for MRSA infections. Performed at [facility], [place] [address], [place], [place] [address]     Radiology Studies: No results found.   Rahsaan Weakland T. [place]  If 7PM-7AM, please contact night-coverage [email or web address] Password [facility] 06/19/2019, 12:50 PM

## 2019-06-19 NOTE — Care Management (Signed)
Consult for [place] . Br[place] is on preferred medicaid , co pay roughly $3.00.   Magdalen Spatz

## 2019-06-19 NOTE — Evaluation (Signed)
Occupational Therapy Evaluation Patient Details Name: Raymond [name] MRN: [id number] DOB: [birth year] Today's Date: 06/19/2019    History of Present Illness 59 year old male presented with seizure-like activity followed by V fib cardiac arrest and non-ST elevation MI s/p ETT in ED and cardiac cath on 6/5. Patient also found to have AKI and hyperkalemia. PMHx significant for prostate CA undergoing chemo, ETOH use, remote drug use and HTN.   Clinical Impression   Patient's daughter present at bedside. Prior to hospital admission, patient was living alone in a 1st floor apartment with 2 STE and bilateral railing. Patient was independent with all BADLs and IADLs without use of device including cooking/cleaning, yard work and driving. Patient currently demonstrates need for assistance with BADLs at overall set-up assist for UB and supervision to Min guard for LB bathing/dressing, toileting/hygiene/clothing management and functional transfers without use of device. Patient would benefit from continued skilled OT services acutely in prep for safe d/c home with initial 24 hour supervision/assist. Patient would also benefit from post-acute HHOT to improve independence and safety with BADLs/IADLs and functional mobility.     Follow Up Recommendations  No OT follow up    Equipment Recommendations  Tub/shower seat    Recommendations for Other Services       Precautions / Restrictions Precautions Precautions: None Restrictions Weight Bearing Restrictions: No      Mobility Bed Mobility Overal bed mobility:  (Patient seated EOB upon entry)             General bed mobility comments: Per clinical judgement and functional assessment, patient would likely require supervision A for supine <> EOB transfers with increased time  Transfers Overall transfer level: Needs assistance Equipment used: None Transfers: Sit to/from [employer] Transfers Sit to Stand: Supervision Stand pivot transfers:  Min guard       General transfer comment: Sit to stand from EOB with use of BUE on bed surface and no DME and stand-pivot transfer to recliner with min guard for safety    Balance Overall balance assessment: Needs assistance Sitting-balance support: No upper extremity supported Sitting balance-Leahy Scale: Normal     Standing balance support: No upper extremity supported;During functional activity Standing balance-Leahy Scale: Good                             ADL either performed or assessed with clinical judgement   ADL Overall ADL's : Needs assistance/impaired                 Upper Body Dressing : Set up   Lower Body Dressing: Supervision/safety   Toilet Transfer: Supervision/safety   Toileting- Clothing Manipulation and Hygiene: Supervision/safety       Functional mobility during ADLs: Min guard General ADL Comments: Without use of DME     Vision Baseline Vision/History: Wears glasses Wears Glasses:  (Patient has not been wearing glasses but says that he should) Vision Assessment?: Yes;No apparent visual deficits     Perception     Praxis      Pertinent Vitals/Pain Pain Assessment: Faces Faces Pain Scale: Hurts a little bit Pain Location:  (In chest with coughing) Pain Intervention(s): Other (comment) (Education on holding pillow when coughing)     Hand Dominance Right   Extremity/Trunk Assessment Upper Extremity Assessment Upper Extremity Assessment: Overall WFL for tasks assessed   Lower Extremity Assessment Lower Extremity Assessment: Defer to PT evaluation       Communication Communication Communication: No difficulties  Cognition Arousal/Alertness: Awake/alert Behavior During Therapy: WFL for tasks assessed/performed Overall Cognitive Status: Within Functional Limits for tasks assessed                                     General Comments  Daughter present at bedside. Patient vitals WNL throughout eval.      Exercises     Shoulder Instructions      Home Living Family/patient expects to be discharged to:: Private residence Living Arrangements: Alone Available Help at Discharge: Family;Available 24 hours/day Type of Home: Apartment Home Access: Stairs to enter [employer] of Steps: 2 Entrance Stairs-Rails: Can reach both Home Layout: One level     Bathroom Shower/Tub: Tub/shower unit (Hand-held shower head, grab bar)   Bathroom Toilet: Standard Bathroom Accessibility: Yes How Accessible: Accessible via walker Home Equipment: Walker - 2 wheels;Grab bars - toilet;Cane - single point;Hand held shower head (Rollator)   Additional Comments: Patient was an independent ambulator      Prior Functioning/Environment Level of Independence: Independent                 OT Problem List:        OT Treatment/Interventions: Self-care/ADL training;Therapeutic exercise;Energy conservation;DME and/or AE instruction;Therapeutic activities;Patient/family education    OT Goals(Current goals can be found in the care plan section) Acute Rehab OT Goals Patient Stated Goal: To return home OT Goal Formulation: With patient Time For Goal Achievement: 07/03/19 Potential to Achieve Goals: Good ADL Goals Pt Will Perform Grooming: with modified independence Pt Will Perform Lower Body Dressing: with supervision;sit to/from stand Pt Will Transfer to Toilet: with modified independence Pt Will Perform Toileting - Clothing Manipulation and hygiene: with supervision;sit to/from stand Pt Will Perform Tub/Shower Transfer: Tub transfer;grab bars;shower seat Additional ADL Goal #1: Patient will recall and demonstrate 3 energy conservation strategies during self-care tasks in prep for safe d/c home.  OT Frequency: Min 2X/week   Barriers to D/C:            Co-evaluation              AM-PAC OT "6 Clicks" Daily Activity     Outcome Measure Help from another person eating meals?:  None Help from another person taking care of personal grooming?: A Little Help from another person toileting, which includes using toliet, bedpan, or urinal?: A Little Help from another person bathing (including washing, rinsing, drying)?: A Little Help from another person to put on and taking off regular upper body clothing?: A Little Help from another person to put on and taking off regular lower body clothing?: A Little 6 Click Score: 19   End of Session Equipment Utilized During Treatment: Gait belt Nurse Communication: Mobility status  Activity Tolerance: Patient tolerated treatment well Patient left: in bed;with call bell/phone within reach  OT Visit Diagnosis: Muscle weakness (generalized) (M62.81)                Time: 8527-7824 OT Time Calculation (min): 32 min Charges:  OT General Charges $OT Visit: 1 Visit OT Evaluation $OT Eval Moderate Complexity: 1 Mod OT Treatments $Self Care/Home Management : 8-22 mins  Jakhai Fant H. OTR/L Supplemental OT, Department of rehab services [phone]  Tymon Nemetz R H. 06/19/2019, 9:47 AM

## 2019-06-20 ENCOUNTER — Telehealth: Payer: Self-pay | Admitting: Physician Assistant

## 2019-06-20 ENCOUNTER — Ambulatory Visit: Payer: Medicaid Other

## 2019-06-20 DIAGNOSIS — R338 Other retention of urine: Secondary | ICD-10-CM

## 2019-06-20 LAB — RENAL FUNCTION PANEL
Albumin: 2.7 g/dL — ABNORMAL LOW (ref 3.5–5.0)
Anion gap: 15 (ref 5–15)
BUN: 13 mg/dL (ref 6–20)
CO2: 21 mmol/L — ABNORMAL LOW (ref 22–32)
Calcium: 8.5 mg/dL — ABNORMAL LOW (ref 8.9–10.3)
Chloride: 102 mmol/L (ref 98–111)
Creatinine, Ser: 1.14 mg/dL (ref 0.61–1.24)
GFR calc Af Amer: >60 mL/min (ref >60)
GFR calc non Af Amer: >60 mL/min (ref >60)
Glucose, Bld: 112 mg/dL — ABNORMAL HIGH (ref 70–99)
Phosphorus: 2.9 mg/dL (ref 2.5–4.6)
Potassium: 3.3 mmol/L — ABNORMAL LOW (ref 3.5–5.1)
Sodium: 138 mmol/L (ref 135–145)

## 2019-06-20 LAB — MAGNESIUM: Magnesium: 2.3 mg/dL (ref 1.7–2.4)

## 2019-06-20 MED ORDER — TICAGRELOR 90 MG PO TABS: 90.0000 mg | ORAL_TABLET | Freq: Two times a day (BID) | ORAL | 11 refills | Status: DC

## 2019-06-20 MED ORDER — ATORVASTATIN CALCIUM 80 MG PO TABS: 80.0000 mg | ORAL_TABLET | Freq: Every day | ORAL | 1 refills | Status: AC

## 2019-06-20 MED ORDER — ASPIRIN 81 MG PO CHEW: 81.0000 mg | CHEWABLE_TABLET | Freq: Every day | ORAL | 1 refills | Status: DC

## 2019-06-20 MED ORDER — METOPROLOL TARTRATE 25 MG PO TABS
25.0000 mg | ORAL_TABLET | Freq: Two times a day (BID) | ORAL | Status: DC
  Administered 2019-06-20: 25 mg via ORAL
  Filled 2019-06-20: qty 1

## 2019-06-20 MED ORDER — LISINOPRIL 10 MG PO TABS
10.0000 mg | ORAL_TABLET | Freq: Every day | ORAL | 1 refills | Status: DC
Start: 2019-06-20 — End: 2021-06-26

## 2019-06-20 MED ORDER — METOPROLOL TARTRATE 25 MG PO TABS: 25.0000 mg | ORAL_TABLET | Freq: Two times a day (BID) | ORAL | 1 refills | Status: DC

## 2019-06-20 MED ORDER — POTASSIUM CHLORIDE CRYS ER 20 MEQ PO TBCR
40.0000 meq | EXTENDED_RELEASE_TABLET | Freq: Once | ORAL | Status: AC
  Administered 2019-06-20: 40 meq via ORAL
  Filled 2019-06-20: qty 2

## 2019-06-20 MED ORDER — POTASSIUM CHLORIDE CRYS ER 20 MEQ PO TBCR: 40.0000 meq | EXTENDED_RELEASE_TABLET | Freq: Every day | ORAL | Status: DC

## 2019-06-20 NOTE — Progress Notes (Signed)
CARDIAC REHAB PHASE I   PRE:  Rate/Rhythm: 99 SR    BP: sitting 143/97    SaO2: 94 RA  MODE:  Ambulation: 470 ft   POST:  Rate/Rhythm: 111 ST    BP: sitting 131/92     SaO2: 94 RA  Pt able to get out of bed and ambulate with RW. He is aware of his foley. Slightly unsteady but did well with RW, no c/o. Pronates feet with steps. VSS, no c/o. To recliner, on chair alarm. Wife and daughter present and we all discussed MI, Brilinta, stent, restrictions, smoking cessation, diet, exercise, NTG and CRPII.  Pt receptive and was able to perform teach back with clues. Family supportive. He has RW and cane at home. Eager to have HHPT for strengthening. Will refer to [facility].  Pt is interested in participating in Virtual Cardiac and Pulmonary Rehab. Pt advised that Virtual Cardiac and Pulmonary Rehab is provided at no cost to the patient.  Checklist:  1. Pt has smart device  ie smartphone and/or ipad for downloading an app  Yes 2. Reliable internet/wifi service    Yes 3. Understands how to use their smartphone and navigate within an app.  Yes Pt verbalized understanding and is in agreement. [id number]  [place], ACSM 06/20/2019 11:22 AM

## 2019-06-20 NOTE — Telephone Encounter (Signed)
    Attention TOC pool,  This patient will need a TOC phone call after discharge. They are being discharged likely today from internal medicine service. Follow-up appointment has already been arranged with: Jory Sims 6/22. They are a patient of Shelva Majestic, MD.  Thank you! Charlie Pitter, PA-C

## 2019-06-20 NOTE — Progress Notes (Signed)
Discharge order written. Instructions reviewed and given to patient. Foley catheter care discussed and attached to discharge papers. All questions answered. Patient discharged  Via wheelchair in stable condition into the care of his significant other.

## 2019-06-20 NOTE — Progress Notes (Addendum)
Repeat EKG reviewed (obtained for better quality) - much better quality, less artifact. NSR 89bpm, possible LAE, LVH with NSST changes, QTC 428, no acute concerns. Continue plan as outlined in note.

## 2019-06-20 NOTE — Care Management (Signed)
1314 06-20-19 Recommendations for home health physical therapy. Case Manager unable to secure a home health agency for therapy due to insurance and staffing via the companies on Medicare.gov list. Patient is agreeable to outpatient physical therapy. Ambulatory referral sent to the [address] location. Per patient his daughter can take him to therapy. With Medicaid, he may be able to get 2-3 visits outpatient. No further needs from Case Manager at this time. Bethena Roys, RN,BSN Case Manager

## 2019-06-20 NOTE — Progress Notes (Signed)
Physical Therapy Treatment Patient Details Name: Raymond [name] MRN: [id number] DOB: Jan 15, 1960 Today's Date: 06/20/2019    History of Present Illness 59 year old male presented with seizure-like activity followed by V fib cardiac arrest and non-ST elevation MI s/p ETT in ED and cardiac cath on 6/5. Patient also found to have AKI and hyperkalemia. PMHx significant for prostate CA undergoing chemo, ETOH use, remote drug use and HTN.    PT Comments    Pt sitting up in recliner, agreeable to ambulation with therapy before d/c home this afternoon. Pt no longer requires supplemental O2 and is currently supervision for safety with transfers and ambulation of 450 feet with RW. Discussed progression of mobility once he gets home to improve endurance. D/c plans remain appropriate.     Follow Up Recommendations  Home health PT;Supervision for mobility/OOB     Equipment Recommendations  None recommended by PT       Precautions / Restrictions Precautions Precautions: None Restrictions Weight Bearing Restrictions: No    Mobility  Bed Mobility               General bed mobility comments: OOB in recliner on entry  Transfers Overall transfer level: Needs assistance Equipment used: None Transfers: Sit to/from Stand;Stand Pivot Transfers Sit to Stand: Supervision         General transfer comment: supervision for safety. good power up and steadying in RW  Ambulation/Gait Ambulation/Gait assistance: Supervision Gait Distance (Feet): 450 Feet Assistive device: Rolling walker (2 wheeled) Gait Pattern/deviations: WFL(Within Functional Limits);Step-through pattern Gait velocity: slowed Gait velocity interpretation: 1.31 - 2.62 ft/sec, indicative of limited community ambulator General Gait Details: increased time and effort with steady ambulation using RW       Balance Overall balance assessment: Needs assistance Sitting-balance support: No upper extremity supported Sitting  balance-Leahy Scale: Normal     Standing balance support: No upper extremity supported;During functional activity Standing balance-Leahy Scale: Good                              Cognition Arousal/Alertness: Awake/alert Behavior During Therapy: WFL for tasks assessed/performed Overall Cognitive Status: Within Functional Limits for tasks assessed                                           General Comments General comments (skin integrity, edema, etc.): VSS on RA       Pertinent Vitals/Pain Pain Assessment: No/denies pain           PT Goals (current goals can now be found in the care plan section) Acute Rehab PT Goals Patient Stated Goal: To return home PT Goal Formulation: With patient Time For Goal Achievement: 07/03/19 Potential to Achieve Goals: Good Progress towards PT goals: Progressing toward goals    Frequency    Min 3X/week      PT Plan Current plan remains appropriate       AM-PAC PT "6 Clicks" Mobility   Outcome Measure  Help needed turning from your back to your side while in a flat bed without using bedrails?: None Help needed moving from lying on your back to sitting on the side of a flat bed without using bedrails?: None Help needed moving to and from a bed to a chair (including a wheelchair)?: A Little Help needed standing up from a chair using your arms (e.g., wheelchair  or bedside chair)?: A Little Help needed to walk in hospital room?: A Little Help needed climbing 3-5 steps with a railing? : A Lot 6 Click Score: 19    End of Session Equipment Utilized During Treatment: Gait belt;Oxygen;Other (comment) Activity Tolerance: Patient tolerated treatment well Patient left: with call bell/phone within reach;with family/visitor present;in chair;with chair alarm set (sitting EoB) Nurse Communication: Mobility status PT Visit Diagnosis: Unsteadiness on feet (R26.81);Other abnormalities of gait and mobility (R26.89);Muscle  weakness (generalized) (M62.81);Difficulty in walking, not elsewhere classified (R26.2);Pain Pain - part of body:  (sternum)     Time: 7353-2992 PT Time Calculation (min) (ACUTE ONLY): 15 min  Charges:  $Gait Training: 8-22 mins                     Charday Capetillo B. Migdalia Dk PT, DPT Acute Rehabilitation Services Pager [phone] Office [phone]    [place] 06/20/2019, 4:21 PM

## 2019-06-20 NOTE — Discharge Summary (Signed)
Physician Discharge Summary  Raymond [name] SWN:[id number] DOB: Jan 14, 1960 DOA: 06/14/2019  PCP: Vista Lawman A, DO  Admit date: 06/14/2019 Discharge date: 06/20/2019  Admitted From: Home Disposition: Home  Recommendations for Outpatient Follow-up:  1. Follow ups as below. 2. Please obtain CBC/BMP/Mag at follow up 3. Please follow up on the following pending results: None  Home Health: Outpatient referral to physical therapy.  Was not able to secure [facility] PT. Equipment/Devices: Shower seat  Discharge Condition: Stable CODE STATUS: Full code   Follow-up Information    [name], MD In 1 week.   Specialty: Urology Why: Foley catheter removal.  Call for appointment  Contact information: [address] [place] [address] [phone]        Lendon Colonel, NP Follow up.   Specialties: Nurse Practitioner, Radiology, Cardiology Why: [place] location - a follow-up has been arranged for you on Tuesday July 01, 2019 at 2:15 PM (Arrive by 2:00 PM). Curt Bears is one of our nurse practitioners that works closely with the cardiology team. Contact information: [address] [place] [address] [phone]        Bonsu, [place], DO. Schedule an appointment as soon as possible for a visit in 1 week(s).   Specialty: Internal Medicine Contact information: [address] [place] [address] [phone]        [facility] Follow up.   Specialty: Rehabilitation Why: Physical Therapy-office to call the patient with a visit time within 2-3 business days. Feel free to call the office to shcedule.  Contact information: [address] [id number] [place] [phone]              Hospital Course: 59 year old male with history of prostate cancer on chemotherapy, hypertension, remote stroke and remote drug use who had seizure-like activity followed by V. fib  cardiac arrest. ROSC after defibrillation x5, epi x5 and total of 450 mg amiodarone and 150 mg lidocaine. Intubated in the ED. He went straight for cardiac cath that revealed 85% stenosis at proximal to mid circumflex that was stented.  Patient was intubated 6/5-6/7. Echocardiogram with LVEF of 55 to 60%, mild LVH and normal RVSP.  EEG on 6/7 with moderate to severe diffuse encephalopathy but no epileptiform discharge.    Hospital course complicated by aspiration pneumonia and hematuria.  He was started on Unasyn and transitioned to p.o. Augmentin.  He underwent ureteroscopy and balloon dilation of the bladder neck by urology on 6/6.  He was transferred to [facility] hospitalist service on 06/19/2019.  Patient continued to do well.  Liberated off oxygen.  Completed antibiotic course for aspiration pneumonia.  Cleared for discharge by cardiology.  Evaluated by therapy who recommended home health PT. However, she was not able to secure home health PT. ambulatory referral for outpatient PT ordered.  Patient to follow-up with PCP, cardiology and urology as above.  Discharge Diagnoses:  Witnessed out of hospital V. fib cardiac arrest -ROSC after defibrillation x5, epi x5 and total of 450 mg amiodarone and 150 mg lidocaine.  -Off amiodarone as of 6/8, and stable.  Non-STEMI -Cardiac cath that revealed 85% stenosis at proximal to mid circumflex that was stented -On DAPT, high intensity statin and beta-blocker per cardiology. -Outpatient follow-up with cardiology as above.   Acute respiratory failure with hypoxia and hypercarbia in the setting of cardiac arrest and possible aspiration-resolved. -Completed antibiotic course for aspiration pneumonia. -ETT 6/5-6/7  Aspiration  pneumonia -Completed antibiotic course for IV Unasyn and p.o. Augmentin  Seizure-like activity -EEG with encephalopathy but no epileptiform discharge.  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  Expect improvement over time.  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 -Urology recommended outpatient follow-up for voiding trial in 1 week.                 Discharge Exam: Vitals:   06/20/19 0811 06/20/19 1156  BP: 128/81   Pulse:    Resp:  (!) 34  Temp: 99.1 F (37.3 C) 98.9 F (37.2 C)  SpO2:      GENERAL: No apparent distress.  Nontoxic. HEENT: MMM.  Vision and hearing grossly intact.  NECK: Supple.  No apparent JVD.  RESP: On room air.  No IWOB.  Fair aeration bilaterally. CVS:  RRR. Heart sounds normal.  ABD/GI/GU: Bowel sounds present. Soft. Non tender.  MSK/EXT:  Moves extremities. No apparent deformity. No edema.  SKIN: no apparent skin lesion or wound NEURO: Awake, alert and oriented appropriately.  No apparent focal neuro deficit. PSYCH: Calm. Normal affect.   Discharge Instructions  Discharge Instructions    Amb Referral to Cardiac Rehabilitation   Complete by: As directed    Diagnosis:  Coronary Stents PTCA NSTEMI     After initial evaluation and assessments completed: Virtual Based Care may be provided alone or in conjunction with Phase 2 Cardiac Rehab based on patient barriers.: Yes   Ambulatory referral to Physical Therapy   Complete by: As directed    Evaluation and treatment- patient may be able to receive a couple of visits hopefully.   Diet - low sodium heart healthy   Complete by: As directed    Discharge instructions   Complete by: As directed    It has been a pleasure taking care of you!  You were hospitalized after cardiac arrest (heart attack).  You were treated surgically and medically.  You were also treated for pneumonia.  We are discharging you on medications for your heart. We may have also started you on other new medications or made some changes  to your home medications during this hospitalization. Please review your new medication list and the directions carefully before you take them.    Please go to your hospital follow-up appointments or call to reschedule as recommended.   Take care,   Increase activity slowly   Complete by: As directed    No wound care   Complete by: As directed      Allergies as of 06/20/2019   No Known Allergies     Medication List    STOP taking these medications   lisinopril-hydrochlorothiazide 20-25 MG tablet Commonly known as: ZESTORETIC   sildenafil 100 MG tablet Commonly known as: VIAGRA   sulindac 200 MG tablet Commonly known as: CLINORIL   tiZANidine 2 MG tablet Commonly known as: ZANAFLEX     TAKE these medications   aspirin 81 MG chewable tablet Chew 1 tablet (81 mg total) by mouth daily. Start taking on: June 21, 2019   atorvastatin 80 MG tablet Commonly known as: LIPITOR Take 1 tablet (80 mg total) by mouth daily. Start taking on: June 21, 2019 What changed:   medication strength  how much to take   lisinopril 10 MG tablet Commonly known as: ZESTRIL Take 1 tablet (10 mg total)  by mouth daily.   metoprolol tartrate 25 MG tablet Commonly known as: LOPRESSOR Take 1 tablet (25 mg total) by mouth 2 (two) times daily.   ticagrelor 90 MG Tabs tablet Commonly known as: BRILINTA Take 1 tablet (90 mg total) by mouth 2 (two) times daily.            Durable Medical Equipment  (From admission, onward)         Start     Ordered   06/20/19 1121  DME Shower stool  Once        06/20/19 1123          Consultations:  Cardiology  Neurology  Pulmonology  Procedures/Studies: 6/5-6/7-ETT 6/5 cardiac cath- Prox Cx to Mid Cx lesion is 85% stenosed > stented 6/6-urethroscopy and balloon dilatation of bladder neck by urology 6/6-TTE 1. Left ventricular ejection fraction, by estimation, is 55 to 60%. The  left ventricle has normal function. The left  ventricle has no regional  wall motion abnormalities. There is mild left ventricular hypertrophy.  Left ventricular diastolic parameters  are indeterminate.  2. Right ventricular systolic function is normal. The right ventricular  size is normal.  3. The mitral valve is normal in structure. No evidence of mitral valve  regurgitation. No evidence of mitral stenosis.  4. The aortic valve is tricuspid. Aortic valve regurgitation is not  visualized. No aortic stenosis is present.    DG Abd 1 View  Result Date: 06/15/2019 CLINICAL DATA:  OG tube placement. EXAM: ABDOMEN - 1 VIEW COMPARISON:  None. FINDINGS: Tip and side port of the enteric tube below the diaphragm in the stomach. Normal bowel gas pattern in the upper abdomen. IMPRESSION: Tip and side port of the enteric tube below the diaphragm in the stomach. Electronically Signed   By: Keith Rake M.D.   On: 06/15/2019 03:30   CT HEAD WO CONTRAST  Result Date: 06/15/2019 CLINICAL DATA:  Encephalopathy.  Post cardiac arrest. EXAM: CT HEAD WITHOUT CONTRAST TECHNIQUE: Contiguous axial images were obtained from the base of the skull through the vertex without intravenous contrast. COMPARISON:  None. FINDINGS: Brain: No intracranial hemorrhage, mass effect, or midline shift. No hydrocephalus. The basilar cisterns are patent. Gray-white differentiation is preserved. There is no evidence of cerebral edema. No evidence of territorial infarct or acute ischemia. No extra-axial or intracranial fluid collection. Vascular: Intravascular IV contrast from recent cardiac catheterization. Hyperdense vessel. Mild skull base atherosclerosis. Skull: No fracture or focal lesion. Sinuses/Orbits: Nasogastric tube in right near. Minor mucosal thickening of ethmoid air cells. Mastoid air cells are clear. Orbits are unremarkable. Other: None. IMPRESSION: No acute intracranial abnormality. Electronically Signed   By: Keith Rake M.D.   On: 06/15/2019 01:05    CARDIAC CATHETERIZATION  Result Date: 06/14/2019  Post intervention, there is a 0% residual stenosis.  Prox Cx to Mid Cx lesion is 85% stenosed.  A stent was successfully placed.  Out of hospital cardiac arrest most likely due to transient occlusion of the proximal circumflex with reperfusion and evidence for  85% proximal stenosis with TIMI-3 flow. Normal LAD and right coronary artery. Preserved global LV contractility with LVEDP 10 mmHg. Successful PCI to the proximal circumflex vessel with PTCA and ultimate insertion of a 3.5 x 15 mm Resolute DES stent with the stenosis being reduced to 0%. RECOMMENDATION: DAPT for minimum of a year.  Hypothermia protocol will be instituted.  We will continue IV Cangrelor.  Initiate Brilinta via NG tube once the patient is in  the CCU with discontinuance of Cangrelor 30 to 60 minutes post Brilinta administration.  We will continue bivalirudin for 2 hours post procedure.  2D echo Doppler study in a.m.   US RENAL  Result Date: 06/15/2019 CLINICAL DATA:  Acute kidney injury. EXAM: RENAL / URINARY TRACT ULTRASOUND COMPLETE COMPARISON:  None. FINDINGS: Right Kidney: Renal measurements: 10.5 x 5.1 x 5.4 cm = volume: 150.1 mL . Echogenicity within normal limits. No mass or hydronephrosis visualized. Left Kidney: Renal measurements: 11.5 x 5.5 x 5.6 cm = volume: 186.2 mL. Echogenicity within normal limits. No mass or hydronephrosis visualized. Bladder: Bladder distended.  No wall thickening, masses or stones. Other: None. IMPRESSION: Normal renal ultrasound. Electronically Signed   By: Lajean Manes M.D.   On: 06/15/2019 07:49   DG Chest Port 1 View  Result Date: 06/18/2019 CLINICAL DATA:  Respiratory failure EXAM: PORTABLE CHEST 1 VIEW COMPARISON:  06/17/2019 FINDINGS: Cardiac shadow is stable. Right jugular central line is again seen and stable. Diffuse bilateral parenchymal opacities are noted similar to that seen on the prior exam. No sizable effusion or pneumothorax is  noted. IMPRESSION: Stable bilateral parenchymal opacities. No new focal abnormality is seen. Electronically Signed   By: Inez Catalina M.D.   On: 06/18/2019 09:58   DG Chest Port 1 View  Result Date: 06/17/2019 CLINICAL DATA:  Respiratory failure EXAM: PORTABLE CHEST 1 VIEW COMPARISON:  Two days ago FINDINGS: Bilateral airspace disease asymmetric to the right. No Kerley lines. No history of hemoptysis. Cardiopericardial enlargement. Right IJ line with tip at the upper cavoatrial junction. No visible air leak. IMPRESSION: New, severe bilateral airspace disease which could be failure, pneumonia/ARDS, or aspiration. Electronically Signed   By: Monte Fantasia M.D.   On: 06/17/2019 09:16   DG CHEST PORT 1 VIEW  Result Date: 06/15/2019 CLINICAL DATA:  Central line placement. EXAM: PORTABLE CHEST 1 VIEW COMPARISON:  Yesterday FINDINGS: Tip of the right internal jugular central venous catheter in the mid SVC. No pneumothorax. Endotracheal tube tip is 4.6 cm from the carina. Enteric tube is in place tip below the diaphragm not included in this chest field of view. Mild cardiomegaly. Developing interstitial opacities suspicious for pulmonary edema. More focal opacity in the right upper lobe again seen. No large pleural effusion. IMPRESSION: 1. Tip of the right internal jugular central venous catheter in the mid SVC. No pneumothorax. 2. Developing interstitial opacities suspicious for pulmonary edema. Cardiomegaly again seen. 3. Slightly more focal right upper lobe opacity is unchanged from yesterday, atelectasis versus pneumonia. Electronically Signed   By: Keith Rake M.D.   On: 06/15/2019 03:32   DG Chest Portable 1 View  Result Date: 06/14/2019 CLINICAL DATA:  Verify endotracheal and orogastric tube placement. EXAM: PORTABLE CHEST 1 VIEW COMPARISON:  None. FINDINGS: Endotracheal tube tip 4.2 cm from the carina. Enteric tube tip and side-port below the diaphragm in the stomach. Low lung volumes. Upper normal  heart size. Vague right suprahilar opacity. No visualized pneumothorax or large pleural effusion. No acute osseous abnormalities are seen. IMPRESSION: 1. Endotracheal tube tip 4.2 cm from the carina. Enteric tube tip and side-port below the diaphragm in the stomach. 2. Low lung volumes. Vague right suprahilar opacity is nonspecific. Atelectasis, pneumonia, aspiration or possibility of pulmonary contusion if there is a history of trauma. Electronically Signed   By: Keith Rake M.D.   On: 06/14/2019 19:46   EEG adult  Result Date: 06/15/2019 Lora Havens, MD     06/15/2019  4:33  PM Patient Name: Karlin Heilman MRN: [id number] Epilepsy Attending: Lora Havens Referring Physician/Provider: Dr Mickel Baas Gleason, PA Date: 06/15/2019 Duration: 24.41 mins Patient history: 59yo M s/p cardiac arrest on TTM. EEG to evaluate for seizure. Level of alertness: comatose AEDs during EEG study: Propofol Technical aspects: This EEG study was done with scalp electrodes positioned according to the 10-20 International system of electrode placement. Electrical activity was acquired at a sampling rate of 500Hz  and reviewed with a high frequency filter of 70Hz  and a low frequency filter of 1Hz . EEG data were recorded continuously and digitally stored. Description: EEG showed continuous generalized 10-11 Hz alpha activity as well as intermittent generalized 2-3hz  delta slowing. EEG was reactive to verbal stimuli.  Hyperventilation and photic stimulation were not performed.   ABNORMALITY -Intermittent slow, generalized IMPRESSION: This study is suggestive of moderate to severe diffuse encephalopathy, nonspecific etiology.  No seizures or definite epileptiform discharges were seen throughout the recording. Priyanka Barbra Sarks   Overnight EEG with video  Result Date: 06/16/2019 Lora Havens, MD     06/16/2019  2:09 PM Patient Name: Davinder Haff MRN: [id number] Epilepsy Attending: Lora Havens Referring Physician/Provider: Dr Elease Etienne, PA Date: 06/15/2019 1503 to 06/16/2019 1147  Patient history: 59yo M s/p cardiac arrest on TTM. EEG to evaluate for seizure.  Level of alertness: awake, asleep  AEDs during EEG study: Propofol  Technical aspects: This EEG study was done with scalp electrodes positioned according to the 10-20 International system of electrode placement. Electrical activity was acquired at a sampling rate of 500Hz  and reviewed with a high frequency filter of 70Hz  and a low frequency filter of 1Hz . EEG data were recorded continuously and digitally stored.  Description: No clear posterior dominant was seen. Sleep was characterized by vertex waves, sleep spindles (12 to 14 Hz), maximal frontocentral region. EEG showed continuous generalized 8-9Hz  theta- alpha activity as well as intermittent generalized 3-5h theta- delta slowing. Hyperventilation and photic stimulation were not performed.    ABNORMALITY -Intermittent slow, generalized  IMPRESSION: This study is suggestive of moderate to severe diffuse encephalopathy, nonspecific etiology.  No seizures or definite epileptiform discharges were seen throughout the recording. EEG appears to be improving compared to previous day.  Lora Havens   DG C-Arm 1-60 Min-No Report  Result Date: 06/15/2019 Fluoroscopy was utilized by the requesting physician.  No radiographic interpretation.   ECHOCARDIOGRAM COMPLETE  Result Date: 06/15/2019    ECHOCARDIOGRAM REPORT   Patient Name:   DORN HARTSHORNE Date of Exam: 06/15/2019 Medical Rec #:  [id number]   Height:       69.0 in Accession #:    [id number]  Weight:       179.9 lb Date of Birth:  1960-06-07   BSA:          1.975 m Patient Age:    58 years    BP:           131/62 mmHg Patient Gender: M           HR:           76 bpm. Exam Location:  Inpatient Procedure: 2D Echo, Cardiac Doppler and Color Doppler Indications:    Cardiac Arrest I46.9  History:        Patient has no prior history of Echocardiogram examinations.  Sonographer:     Merrie Roof RDCS Referring Phys: [place]  1. Left ventricular ejection fraction, by estimation, is 55 to 60%. The left ventricle  has normal function. The left ventricle has no regional wall motion abnormalities. There is mild left ventricular hypertrophy. Left ventricular diastolic parameters are indeterminate.  2. Right ventricular systolic function is normal. The right ventricular size is normal.  3. The mitral valve is normal in structure. No evidence of mitral valve regurgitation. No evidence of mitral stenosis.  4. The aortic valve is tricuspid. Aortic valve regurgitation is not visualized. No aortic stenosis is present. FINDINGS  Left Ventricle: Left ventricular ejection fraction, by estimation, is 55 to 60%. The left ventricle has normal function. The left ventricle has no regional wall motion abnormalities. The left ventricular internal cavity size was normal in size. There is  mild left ventricular hypertrophy. Left ventricular diastolic parameters are indeterminate. Right Ventricle: The right ventricular size is normal. No increase in right ventricular wall thickness. Right ventricular systolic function is normal. Left Atrium: Left atrial size was normal in size. Right Atrium: Right atrial size was normal in size. Pericardium: There is no evidence of pericardial effusion. Mitral Valve: The mitral valve is normal in structure. No evidence of mitral valve regurgitation. No evidence of mitral valve stenosis. Tricuspid Valve: The tricuspid valve is normal in structure. Tricuspid valve regurgitation is trivial. No evidence of tricuspid stenosis. Aortic Valve: The aortic valve is tricuspid. Aortic valve regurgitation is not visualized. No aortic stenosis is present. Aortic valve mean gradient measures 5.1 mmHg. Aortic valve peak gradient measures 10.0 mmHg. Aortic valve area, by VTI measures 1.98  cm. Pulmonic Valve: The pulmonic valve was not well visualized. Pulmonic valve  regurgitation is mild. No evidence of pulmonic stenosis. Aorta: The aortic root is normal in size and structure. Venous: IVC assessment for right atrial pressure unable to be performed due to mechanical ventilation. IAS/Shunts: No atrial level shunt detected by color flow Doppler.  LEFT VENTRICLE PLAX 2D LVIDd:         4.90 cm      Diastology LVIDs:         3.40 cm      LV e' lateral:   5.98 cm/s LV PW:         1.20 cm      LV E/e' lateral: 7.5 LV IVS:        1.20 cm      LV e' medial:    8.49 cm/s LVOT diam:     2.00 cm      LV E/e' medial:  5.3 LV SV:         43 LV SV Index:   22 LVOT Area:     3.14 cm  LV Volumes (MOD) LV vol d, MOD A2C: 77.1 ml LV vol d, MOD A4C: 127.0 ml LV vol s, MOD A2C: 37.2 ml LV vol s, MOD A4C: 47.6 ml LV SV MOD A2C:     39.9 ml LV SV MOD A4C:     127.0 ml LV SV MOD BP:      63.2 ml RIGHT VENTRICLE             IVC RV Basal diam:  3.40 cm     IVC diam: 2.10 cm RV S prime:     19.40 cm/s TAPSE (M-mode): 2.4 cm LEFT ATRIUM             Index       RIGHT ATRIUM           Index LA diam:        3.20 cm 1.62 cm/m  RA Area:  21.30 cm LA Vol (A2C):   81.0 ml 41.01 ml/m RA Volume:   61.80 ml  31.29 ml/m LA Vol (A4C):   51.2 ml 25.92 ml/m LA Biplane Vol: 69.5 ml 35.19 ml/m  AORTIC VALVE AV Area (Vmax):    1.83 cm AV Area (Vmean):   1.89 cm AV Area (VTI):     1.98 cm AV Vmax:           158.11 cm/s AV Vmean:          106.477 cm/s AV VTI:            0.218 m AV Peak Grad:      10.0 mmHg AV Mean Grad:      5.1 mmHg LVOT Vmax:         92.22 cm/s LVOT Vmean:        64.051 cm/s LVOT VTI:          0.137 m LVOT/AV VTI ratio: 0.63  AORTA Ao Root diam: 3.60 cm Ao Asc diam:  3.30 cm MITRAL VALVE MV Area (PHT): 3.12 cm    SHUNTS MV Decel Time: 243 msec    Systemic VTI:  0.14 m MV E velocity: 45.00 cm/s  Systemic Diam: 2.00 cm MV A velocity: 47.60 cm/s MV E/A ratio:  0.95 Carlyle Dolly MD Electronically signed by Carlyle Dolly MD Signature Date/Time: 06/15/2019/1:43:50 PM    Final         The  results of significant diagnostics from this hospitalization (including imaging, microbiology, ancillary and laboratory) are listed below for reference.     Microbiology: Recent Results (from the past 240 hour(s))  SARS Coronavirus 2 by RT PCR (hospital order, performed in [facility] hospital lab) Nasopharyngeal Nasopharyngeal Swab     Status: None   Collection Time: 06/14/19  8:15 PM   Specimen: Nasopharyngeal Swab  Result Value Ref Range Status   SARS Coronavirus 2 NEGATIVE NEGATIVE Final    Comment: (NOTE) SARS-CoV-2 target nucleic acids are NOT DETECTED. The SARS-CoV-2 RNA is generally detectable in upper and lower respiratory specimens during the acute phase of infection. The lowest concentration of SARS-CoV-2 viral copies this assay can detect is 250 copies / mL. A negative result does not preclude SARS-CoV-2 infection and should not be used as the sole basis for treatment or other patient management decisions.  A negative result may occur with improper specimen collection / handling, submission of specimen other than nasopharyngeal swab, presence of viral mutation(s) within the areas targeted by this assay, and inadequate number of viral copies (<250 copies / mL). A negative result must be combined with clinical observations, patient history, and epidemiological information. Fact Sheet for Patients:   StrictlyIdeas.no Fact Sheet for Healthcare Providers: BankingDealers.co.za This test is not yet approved or cleared  by the [place] FDA and has been authorized for detection and/or diagnosis of SARS-CoV-2 by FDA under an Emergency Use Authorization (EUA).  This EUA will remain in effect (meaning this test can be used) for the duration of the COVID-19 declaration under Section 564(b)(1) of the Act, 21 U.S.C. section 360bbb-3(b)(1), unless the authorization is terminated or revoked sooner. Performed at [facility],  [place] [address], [place], [place] [address]   MRSA PCR Screening     Status: None   Collection Time: 06/14/19 10:53 PM   Specimen: Nasopharyngeal  Result Value Ref Range Status   MRSA by PCR NEGATIVE NEGATIVE Final    Comment:        The GeneXpert  MRSA Assay (FDA approved for NASAL specimens only), is one component of a comprehensive MRSA colonization surveillance program. It is not intended to diagnose MRSA infection nor to guide or monitor treatment for MRSA infections. Performed at [facility], [place] [address] [place], [place] [address]      Labs: BNP (last 3 results) Recent Labs    06/14/19 1943  BNP 44.3   Basic Metabolic Panel: Recent Labs  Lab 06/15/19 0415 06/15/19 0607 06/17/19 0629 06/17/19 0629 06/17/19 1654 06/18/19 0403 06/18/19 2017 06/19/19 0359 06/20/19 0451  NA 136   < > 141   < > 140 141 140 141 138  K 3.1*   < > 2.8*   < > 2.7* 3.0* 3.2* 3.5 3.3*  CL 107   < > 108   < > 103 104 102 104 102  CO2 19*   < > 24   < > 27 26 26 26  21*  GLUCOSE 168*   < > 117*   < > 139* 119* 104* 107* 112*  BUN 20   < > 7   < > 7 7 7 10 13   CREATININE 1.28*   < > 1.04   < > 1.10 1.09 1.14 1.14 1.14  CALCIUM 7.6*   < > 7.6*   < > 8.0* 7.7* 8.3* 8.4* 8.5*  MG 2.3  --  2.0  --   --  1.9  --  2.5* 2.3  PHOS 3.2   < > 1.6*  --   --  1.7* 2.4* 2.3* 2.9   < > = values in this interval not displayed.   Liver Function Tests: Recent Labs  Lab 06/14/19 1943 06/20/19 0451  AST 168*  --   ALT 104*  --   ALKPHOS 37*  --   BILITOT 0.6  --   PROT 6.7  --   ALBUMIN 3.5 2.7*   No results for input(s): LIPASE, AMYLASE in the last 168 hours. No results for input(s): AMMONIA in the last 168 hours. CBC: Recent Labs  Lab 06/14/19 1943 06/14/19 1951 06/16/19 0424 06/16/19 0724 06/16/19 1122 06/17/19 0413 06/17/19 0559 06/18/19 0403 06/19/19 0359  WBC 11.1*   < > 13.3*  --  10.5 9.6  --  10.7* 7.8  NEUTROABS 8.0*  --   --   --   --   --   --   --   --   HGB 14.4    < > 10.0*   < > 9.7* 8.4* 8.2* 9.3* 10.4*  HCT 46.5   < > 31.5*   < > 30.7* 26.1* 24.0* 28.8* 32.6*  MCV 87.7   < > 86.8  --  86.5 86.1  --  85.0 84.9  PLT 180   < > 154  --  132* 136*  --  166 222   < > = values in this interval not displayed.   Cardiac Enzymes: No results for input(s): CKTOTAL, CKMB, CKMBINDEX, TROPONINI in the last 168 hours. BNP: Invalid input(s): POCBNP CBG: Recent Labs  Lab 06/15/19 2327 06/16/19 0424 06/16/19 0807 06/16/19 1120 06/16/19 1520  GLUCAP 184* 162* 135* 130* 116*   D-Dimer No results for input(s): DDIMER in the last 72 hours. Hgb A1c No results for input(s): HGBA1C in the last 72 hours. Lipid Profile No results for input(s): CHOL, HDL, LDLCALC, TRIG, CHOLHDL, LDLDIRECT in the last 72 hours. Thyroid function studies No results for input(s): TSH, T4TOTAL, T3FREE, THYROIDAB in the last 72 hours.  Invalid input(s): FREET3 Anemia work up No results for input(s): VITAMINB12, FOLATE, FERRITIN, TIBC, IRON, RETICCTPCT in the last 72 hours. Urinalysis    Component Value Date/Time   COLORURINE YELLOW 06/14/2019 1955   APPEARANCEUR CLOUDY (A) 06/14/2019 1955   LABSPEC 1.011 06/14/2019 1955   PHURINE 8.0 06/14/2019 1955   GLUCOSEU >=500 (A) 06/14/2019 1955   HGBUR MODERATE (A) 06/14/2019 1955   BILIRUBINUR NEGATIVE 06/14/2019 [place] NEGATIVE 06/14/2019 1955   PROTEINUR >=300 (A) 06/14/2019 1955   NITRITE NEGATIVE 06/14/2019 1955   LEUKOCYTESUR NEGATIVE 06/14/2019 1955   Sepsis Labs Invalid input(s): PROCALCITONIN,  WBC,  LACTICIDVEN   Time coordinating discharge: 35 minutes  SIGNED:  Mercy Riding, MD  [facility] 06/20/2019, 5:14 PM  If 7PM-7AM, please contact night-coverage [email or web address] Password [email or web address]

## 2019-06-20 NOTE — Progress Notes (Addendum)
Progress Note  Patient Name: Raymond [name] Date of Encounter: 06/20/2019  Primary Cardiologist: Shelva Majestic, MD  Subjective   Feeling well today, no complaints. A+Ox3. Wife reports supportive family at home to assist, 4 daughters. It sounds like she still has some concerns about mental status but he is appropriate in conversation today.  Inpatient Medications    Scheduled Meds: . amoxicillin-clavulanate  1 tablet Oral Q12H  . aspirin  81 mg Oral Daily  . atorvastatin  80 mg Oral Daily  . Chlorhexidine Gluconate Cloth  6 each Topical Daily  . enoxaparin (LOVENOX) injection  40 mg Subcutaneous Daily  . furosemide  40 mg Oral Daily  . lidocaine  1 patch Transdermal Q24H  . mouth rinse  15 mL Mouth Rinse BID  . metoprolol tartrate  12.5 mg Oral BID  . potassium chloride  40 mEq Oral Daily  . sodium chloride flush  10-40 mL Intracatheter Q12H  . sodium chloride flush  3 mL Intravenous Q12H  . ticagrelor  90 mg Oral BID   Continuous Infusions:  PRN Meds: acetaminophen, docusate, ondansetron (ZOFRAN) IV, polyethylene glycol, sodium chloride flush, sodium chloride flush   Vital Signs    Vitals:   06/19/19 2220 06/19/19 2335 06/20/19 0636 06/20/19 0811  BP:  (!) 140/91 136/89 128/81  Pulse:  86 94   Resp:  (!) 40 (!) 36   Temp:  99.1 F (37.3 C) 98.1 F (36.7 C) 99.1 F (37.3 C)  TempSrc:  Oral Oral Oral  SpO2: 95% 97% 95%   Weight:   75.8 kg   Height:        Intake/Output Summary (Last 24 hours) at 06/20/2019 0951 Last data filed at 06/20/2019 0648 Gross per 24 hour  Intake 960 ml  Output 700 ml  Net 260 ml   Last 3 Weights 06/20/2019 06/19/2019 06/18/2019  Weight (lbs) 167 lb 168 lb 3.2 oz 176 lb 2.4 oz  Weight (kg) 75.751 kg 76.295 kg 79.9 kg     Telemetry    NSR - Personally Reviewed  ECG    Last assessed 6/8, will order repeat for today - Personally Reviewed  Physical Exam   GEN: No acute distress.  HEENT: Normocephalic, atraumatic, sclera  non-icteric. Neck: No JVD or bruits. Cardiac: RRR no murmurs, rubs, or gallops.  Radials/DP/PT 1+ and equal bilaterally.  Respiratory: Clear to auscultation bilaterally. Breathing is unlabored. GI: Soft, nontender, non-distended, BS +x 4. MS: no deformity. Extremities: No clubbing or cyanosis. No edema. Distal pedal pulses are 2+ and equal bilaterally. Right groin cath site without hematoma, ecchymosis, or bruit. I removed tegaderm dressing without complication. Neuro:  AAOx3. Follows commands. Psych:  Responds to questions appropriately with a normal affect.  Labs    High Sensitivity Troponin:   Recent Labs  Lab 06/14/19 1943 06/15/19 0109 06/17/19 1654  TROPONINIHS 1,438* >27,000* 6,472*      Cardiac EnzymesNo results for input(s): TROPONINI in the last 168 hours. No results for input(s): TROPIPOC in the last 168 hours.   Chemistry Recent Labs  Lab 06/14/19 1943 06/14/19 1951 06/18/19 0403 06/18/19 2017 06/19/19 0359  NA 138   < > 141 140 141  K 2.5*   < > 3.0* 3.2* 3.5  CL 100   < > 104 102 104  CO2 18*   < > 26 26 26   GLUCOSE 294*   < > 119* 104* 107*  BUN 16   < > 7 7 10   CREATININE 1.67*   < >  1.09 1.14 1.14  CALCIUM 8.5*   < > 7.7* 8.3* 8.4*  PROT 6.7  --   --   --   --   ALBUMIN 3.5  --   --   --   --   AST 168*  --   --   --   --   ALT 104*  --   --   --   --   ALKPHOS 37*  --   --   --   --   BILITOT 0.6  --   --   --   --   GFRNONAA 44*   < > >60 >60 >60  GFRAA 51*   < > >60 >60 >60  ANIONGAP 20*   < > 11 12 11    < > = values in this interval not displayed.     Hematology Recent Labs  Lab 06/17/19 0413 06/17/19 0413 06/17/19 0559 06/18/19 0403 06/19/19 0359  WBC 9.6  --   --  10.7* 7.8  RBC 3.03*  --   --  3.39* 3.84*  HGB 8.4*   < > 8.2* 9.3* 10.4*  HCT 26.1*   < > 24.0* 28.8* 32.6*  MCV 86.1  --   --  85.0 84.9  MCH 27.7  --   --  27.4 27.1  MCHC 32.2  --   --  32.3 31.9  RDW 15.7*  --   --  15.1 15.0  PLT 136*  --   --  166 222   < >  = values in this interval not displayed.    BNP Recent Labs  Lab 06/14/19 1943  BNP 40.2     DDimer No results for input(s): DDIMER in the last 168 hours.   Radiology    No results found.  Cardiac Studies   [facility] 06/14/19  Post intervention, there is a 0% residual stenosis.  Prox Cx to Mid Cx lesion is 85% stenosed.  A stent was successfully placed.   Out of hospital cardiac arrest most likely due to transient occlusion of the proximal circumflex with reperfusion and evidence for  85% proximal stenosis with TIMI-3 flow.  Normal LAD and right coronary artery.  Preserved global LV contractility with LVEDP 10 mmHg.  Successful PCI to the proximal circumflex vessel with PTCA and ultimate insertion of a 3.5 x 15 mm Resolute DES stent with the stenosis being reduced to 0%.  RECOMMENDATION: DAPT for minimum of a year.  Hypothermia protocol will be instituted.  We will continue IV Cangrelor.  Initiate Brilinta via NG tube once the patient is in the CCU with discontinuance of Cangrelor 30 to 60 minutes post Brilinta administration.  We will continue bivalirudin for 2 hours post procedure.  2D echo Doppler study in a.m.  2D echo 06/15/19 MPRESSIONS  1. Left ventricular ejection fraction, by estimation, is 55 to 60%. The  left ventricle has normal function. The left ventricle has no regional  wall motion abnormalities. There is mild left ventricular hypertrophy.  Left ventricular diastolic parameters  are indeterminate.  2. Right ventricular systolic function is normal. The right ventricular  size is normal.  3. The mitral valve is normal in structure. No evidence of mitral valve  regurgitation. No evidence of mitral stenosis.  4. The aortic valve is tricuspid. Aortic valve regurgitation is not  visualized. No aortic stenosis is present.  Patient Profile     59 y.o. male with prostate CA (currently receiving XRT), HTN, HLD, remote substance abuse  presented to [facility] with  witnessed cardiac arrest, found to be in VF on arrival. Urgent cath 06/14/19 showed that arrest was likely due to transient occlusion of the proximal circumflex with reperfusion and 85% proximal stenosis, s/p PTCA/DES to this vessel. LVEF 55-60% by echocardiogram. UDS negative. Hospitalization also notable for AKI on arrival (Cr 1.67), lactic acidosis, elevated LFTs, hypokalemia, hypocalcemia, anemia, acute respiratory failure with bilateral pulmonary infiltrates (possible aspiration pneumonia), bladder neck outflow obstruction and hematuria secondary to urethral trauma, s/p urethroscopy and dilation on 6/6, seizure-like activity and anoxic encephalopathy.  Assessment & Plan    1. Witnessed out-of-hospital VF arrest - suspected due to Cx occlusion. Mg OK, but potassium required repletion this admission. EF preserved. Will need to avoid driving for 6 months. Last EKG 6/8 showed QTc was still prolonged. Now that he is doing much better will update tracing today for new baseline for better comparison in outpatient setting. Addendum: repeat EKG reviewed - NSR, 99bpm, NSST changes, wandering baseline but QTC improved to 425ms. Will ask for steadier repeat just for future comparisons in the inferior leads.  2. CAD with NSTEMI - s/p DES to LCx. Continue ASA, statin, BB, Brilinta. HR still 90s at times and BP is normotensive. Increase metoprolol to 25mg  BID.  3. Acute respiratory failure with bilateral pulmonary infiltrates - Dr. Burt Knack suspected more likely pulmonary than cardiogenic. He is on abx per IM. He now appears euvolemic. Per d/w Dr. Burt Knack, will stop standing Lasix.   4. Anoxic encephalopathy - Dr. Burt Knack suspects this is related to his cardiac arrest and hopefully will improve over time. He is A+Ox3 today and seems appropriate. It sounds like from notes wife has noticed some personality changes.  5. Hypokalemia - required replacement this admission, likely related to diuresis. K 3.5 today. Goal 4.0 or  greater. Will give one time 81meq dose. We are stopping Lasix so should not require standing repletion at this time. Mg OK.  6. Other medical issues as outlined above, per IM.  F/u has been arranged 6/22 in our office. Brilinta rx sent in by pharmD to [facility]. Patient is on IM service so we will likely sign off today.  For questions or updates, please contact [place] Please consult [email or web address] for contact info under Cardiology/STEMI.  Signed, Charlie Pitter, PA-C 06/20/2019, 9:51 AM    Patient seen, examined. Available data reviewed. Agree with findings, assessment, and plan as outlined by Melina Copa, PA-C. On my exam: Vitals:   06/20/19 0636 06/20/19 0811  BP: 136/89 128/81  Pulse: 94   Resp: (!) 36   Temp: 98.1 F (36.7 C) 99.1 F (37.3 C)  SpO2: 95%    Pt is alert and oriented, NAD HEENT: normal Neck: JVP - normal, carotids 2+= without bruits Lungs: CTA bilaterally CV: RRR without murmur or gallop Abd: soft, NT, Positive BS, no hepatomegaly Ext: no C/C/E, distal pulses intact and equal Skin: warm/dry no rash  The patient looks good today. He is more oriented and interactive today. Discussed cardiac meds and follow-up plans with the patient, his wife, and his daughter this morning. Telemetry review shows sinus rhythm/sinus tach with no arrhythmia. Will increase metoprolol to 25 mg BID and stop furosemide. Otherwise as outlined above.  [facility] will sign off.   Medication Recommendations:  Continue current Rx Other recommendations (labs, testing, etc):  none Follow up as an outpatient:  Will arrange cardiology follow-up   Sherren Mocha, M.D. 06/20/2019 11:13 AM

## 2019-06-20 NOTE — Telephone Encounter (Signed)
Pt currently admitted. TOC call for 6/14

## 2019-06-20 NOTE — Discharge Instructions (Signed)
No driving for 6 months. No lifting over 10 lbs for 4 weeks. No sexual activity for 4 weeks. You may not return to work until cleared by your cardiologist. Keep procedure site clean & dry. If you notice increased pain, swelling, bleeding or pus, call/return!  You may shower, but no soaking baths/hot tubs/pools for 1 week.    Radial Site Care  This sheet gives you information about how to care for yourself after your procedure. Your health care provider may also give you more specific instructions. If you have problems or questions, contact your health care provider. What can I expect after the procedure? After the procedure, it is common to have:  Bruising and tenderness at the catheter insertion area. Follow these instructions at home: Medicines  Take over-the-counter and prescription medicines only as told by your health care provider. Insertion site care  Follow instructions from your health care provider about how to take care of your insertion site. Make sure you: ? Wash your hands with soap and water before you change your bandage (dressing). If soap and water are not available, use hand sanitizer. ? Change your dressing as told by your health care provider. ? Leave stitches (sutures), skin glue, or adhesive strips in place. These skin closures may need to stay in place for 2 weeks or longer. If adhesive strip edges start to loosen and curl up, you may trim the loose edges. Do not remove adhesive strips completely unless your health care provider tells you to do that.  Check your insertion site every day for signs of infection. Check for: ? Redness, swelling, or pain. ? Fluid or blood. ? Pus or a bad smell. ? Warmth.  Do not take baths, swim, or use a hot tub until your health care provider approves.  You may shower 24-48 hours after the procedure, or as directed by your health care provider. ? Remove the dressing and gently wash the site with plain soap and water. ? Pat the area  dry with a clean towel. ? Do not rub the site. That could cause bleeding.  Do not apply powder or lotion to the site. Activity   For 24 hours after the procedure, or as directed by your health care provider: ? Do not flex or bend the affected arm. ? Do not push or pull heavy objects with the affected arm. ? Do not drive yourself home from the hospital or clinic. You may drive 24 hours after the procedure unless your health care provider tells you not to. ? Do not operate machinery or power tools.  Do not lift anything that is heavier than 10 lb (4.5 kg), or the limit that you are told, until your health care provider says that it is safe.  Ask your health care provider when it is okay to: ? Return to work or school. ? Resume usual physical activities or sports. ? Resume sexual activity. General instructions  If the catheter site starts to bleed, raise your arm and put firm pressure on the site. If the bleeding does not stop, get help right away. This is a medical emergency.  If you went home on the same day as your procedure, a responsible adult should be with you for the first 24 hours after you arrive home.  Keep all follow-up visits as told by your health care provider. This is important. Contact a health care provider if:  You have a fever.  You have redness, swelling, or yellow drainage around your insertion  site. Get help right away if:  You have unusual pain at the radial site.  The catheter insertion area swells very fast.  The insertion area is bleeding, and the bleeding does not stop when you hold steady pressure on the area.  Your arm or hand becomes pale, cool, tingly, or numb. These symptoms may represent a serious problem that is an emergency. Do not wait to see if the symptoms will go away. Get medical help right away. Call your local emergency services (911 in the [place] Do not drive yourself to the hospital. Summary  After the procedure, it is common to  have bruising and tenderness at the site.  Follow instructions from your health care provider about how to take care of your radial site wound. Check the wound every day for signs of infection.  Do not lift anything that is heavier than 10 lb (4.5 kg), or the limit that you are told, until your health care provider says that it is safe. This information is not intended to replace advice given to you by your health care provider. Make sure you discuss any questions you have with your health care provider. Document Revised: 01/31/2017 Document Reviewed: 01/31/2017 Elsevier Patient Education  [place], Adult An indwelling urinary catheter is a thin tube that is put into your bladder. The tube helps to drain pee (urine) out of your body. The tube goes in through your urethra. Your urethra is where pee comes out of your body. Your pee will come out through the catheter, then it will go into a bag (drainage bag). Take good care of your catheter so it will work well. How to wear your catheter and bag Supplies needed  Sticky tape (adhesive tape) or a leg strap.  Alcohol wipe or soap and water (if you use tape).  A clean towel (if you use tape).  Large overnight bag.  Smaller bag (leg bag). Wearing your catheter Attach your catheter to your leg with tape or a leg strap.  Make sure the catheter is not pulled tight.  If a leg strap gets wet, take it off and put on a dry strap.  If you use tape to hold the bag on your leg: 1. Use an alcohol wipe or soap and water to wash your skin where the tape made it sticky before. 2. Use a clean towel to pat-dry that skin. 3. Use new tape to make the bag stay on your leg. Wearing your bags You should have been given a large overnight bag.  You may wear the overnight bag in the day or night.  Always have the overnight bag lower than your bladder.  Do not let the bag touch the floor.  Before you go to sleep,  put a clean plastic bag in a wastebasket. Then hang the overnight bag inside the wastebasket. You should also have a smaller leg bag that fits under your clothes.  Always wear the leg bag below your knee.  Do not wear your leg bag at night. How to care for your skin and catheter Supplies needed  A clean washcloth.  Water and mild soap.  A clean towel. Caring for your skin and catheter      Clean the skin around your catheter every day: 1. Wash your hands with soap and water. 2. Wet a clean washcloth in warm water and mild soap. 3. Clean the skin around your urethra.  If you are male:  Gently spread  the folds of skin around your vagina (labia).  With the washcloth in your other hand, wipe the inner side of your labia on each side. Wipe from front to back.  If you are male:  Pull back any skin that covers the end of your penis (foreskin).  With the washcloth in your other hand, wipe your penis in small circles. Start wiping at the tip of your penis, then move away from the catheter.  Move the foreskin back in place, if needed. 4. With your free hand, hold the catheter close to where it goes into your body.  Keep holding the catheter during cleaning so it does not get pulled out. 5. With the washcloth in your other hand, clean the catheter.  Only wipe downward on the catheter.  Do not wipe upward toward your body. Doing this may push germs into your urethra and cause infection. 6. Use a clean towel to pat-dry the catheter and the skin around it. Make sure to wipe off all soap. 7. Wash your hands with soap and water.  Shower every day. Do not take baths.  Do not use cream, ointment, or lotion on the area where the catheter goes into your body, unless your doctor tells you to.  Do not use powders, sprays, or lotions on your genital area.  Check your skin around the catheter every day for signs of infection. Check for: ? Redness, swelling, or pain. ? Fluid or  blood. ? Warmth. ? Pus or a bad smell. How to empty the bag Supplies needed  Rubbing alcohol.  Gauze pad or cotton ball.  Tape or a leg strap. Emptying the bag Pour the pee out of your bag when it is ?- full, or at least 2-3 times a day. Do this for your overnight bag and your leg bag. 1. Wash your hands with soap and water. 2. Separate (detach) the bag from your leg. 3. Hold the bag over the toilet or a clean pail. Keep the bag lower than your hips and bladder. This is so the pee (urine) does not go back into the tube. 4. Open the pour spout. It is at the bottom of the bag. 5. Empty the pee into the toilet or pail. Do not let the pour spout touch any surface. 6. Put rubbing alcohol on a gauze pad or cotton ball. 7. Use the gauze pad or cotton ball to clean the pour spout. 8. Close the pour spout. 9. Attach the bag to your leg with tape or a leg strap. 10. Wash your hands with soap and water. Follow instructions for cleaning the drainage bag:  From the product maker.  As told by your doctor. How to change the bag Supplies needed  Alcohol wipes.  A clean bag.  Tape or a leg strap. Changing the bag Replace your bag when it starts to leak, smell bad, or look dirty. 1. Wash your hands with soap and water. 2. Separate the dirty bag from your leg. 3. Pinch the catheter with your fingers so that pee does not spill out. 4. Separate the catheter tube from the bag tube where these tubes connect (at the connection valve). Do not let the tubes touch any surface. 5. Clean the end of the catheter tube with an alcohol wipe. Use a different alcohol wipe to clean the end of the bag tube. 6. Connect the catheter tube to the tube of the clean bag. 7. Attach the clean bag to your leg with tape or  a leg strap. Do not make the bag tight on your leg. 8. Wash your hands with soap and water. General rules   Never pull on your catheter. Never try to take it out. Doing that can hurt  you.  Always wash your hands before and after you touch your catheter or bag. Use a mild, fragrance-free soap. If you do not have soap and water, use hand sanitizer.  Always make sure there are no twists or bends (kinks) in the catheter tube.  Always make sure there are no leaks in the catheter or bag.  Drink enough fluid to keep your pee pale yellow.  Do not take baths, swim, or use a hot tub.  If you are male, wipe from front to back after you poop (have a bowel movement). Contact a doctor if:  Your pee is cloudy.  Your pee smells worse than usual.  Your catheter gets clogged.  Your catheter leaks.  Your bladder feels full. Get help right away if:  You have redness, swelling, or pain where the catheter goes into your body.  You have fluid, blood, pus, or a bad smell coming from the area where the catheter goes into your body.  Your skin feels warm where the catheter goes into your body.  You have a fever.  You have pain in your: ? Belly (abdomen). ? Legs. ? Lower back. ? Bladder.  You see blood in the catheter.  Your pee is pink or red.  You feel sick to your stomach (nauseous).  You throw up (vomit).  You have chills.  Your pee is not draining into the bag.  Your catheter gets pulled out. Summary  An indwelling urinary catheter is a thin tube that is placed into the bladder to help drain pee (urine) out of the body.  The catheter is placed into the part of the body that drains pee from the bladder (urethra).  Taking good care of your catheter will keep it working properly and help prevent problems.  Always wash your hands before and after touching your catheter or bag.  Never pull on your catheter or try to take it out. This information is not intended to replace advice given to you by your health care provider. Make sure you discuss any questions you have with your health care provider. Document Revised: 04/19/2018 Document Reviewed:  08/11/2016 Elsevier Patient Education  [place].

## 2019-06-23 ENCOUNTER — Ambulatory Visit: Payer: Medicaid Other

## 2019-06-23 ENCOUNTER — Telehealth: Payer: Self-pay | Admitting: Radiation Oncology

## 2019-06-23 NOTE — Telephone Encounter (Signed)
Noted patient was discharged from the hospital on Friday. Phoned to inquire when he planned to resume radiation therapy. Patient reports he is having his urinary catheter removed tomorrow at [facility] at 0830. Patient would like to have radiation following the removal of his catheter. Instructed patient to present to [facility] rad onc after his catheter is removed at [facility] and the therapist would work him in for radiation treatment. Patient verbalized understanding and committed to doing this. Informed Merrilee Seashore, RT on L3 of these findings and intentions.

## 2019-06-23 NOTE — Telephone Encounter (Signed)
Patient contacted regarding discharge from [facility] on 06/20/19.  Patient understands to follow up with provider Jory Sims, DNP on 6/22 at 2:15 pm at [facility]. Patient understands discharge instructions? yes Patient understands medications and regiment? yes Patient understands to bring all medications to this visit? yes

## 2019-06-24 ENCOUNTER — Encounter: Payer: Self-pay | Admitting: Urology

## 2019-06-24 ENCOUNTER — Ambulatory Visit
Admission: RE | Admit: 2019-06-24 | Discharge: 2019-06-24 | Disposition: A | Discharge status: Home / Self Care | Payer: Medicaid Other | Source: Ambulatory Visit | Attending: Radiation Oncology | Admitting: Radiation Oncology

## 2019-06-24 ENCOUNTER — Other Ambulatory Visit: Payer: Self-pay

## 2019-06-24 DIAGNOSIS — C61 Malignant neoplasm of prostate: Secondary | ICD-10-CM | POA: Diagnosis not present

## 2019-06-25 ENCOUNTER — Ambulatory Visit: Payer: Medicaid Other

## 2019-06-25 ENCOUNTER — Ambulatory Visit
Admission: RE | Admit: 2019-06-25 | Discharge: 2019-06-25 | Disposition: A | Discharge status: Home / Self Care | Payer: Medicaid Other | Source: Ambulatory Visit | Attending: Radiation Oncology | Admitting: Radiation Oncology

## 2019-06-25 ENCOUNTER — Other Ambulatory Visit: Payer: Self-pay

## 2019-06-25 ENCOUNTER — Telehealth (HOSPITAL_COMMUNITY): Payer: Self-pay

## 2019-06-25 DIAGNOSIS — C61 Malignant neoplasm of prostate: Secondary | ICD-10-CM | POA: Diagnosis not present

## 2019-06-25 NOTE — Telephone Encounter (Signed)
Pt insurance is active through [place]. NVV#[id number]  Will fax over Medicaid Reimbursement form to Dr. Claiborne Billings  Will contact pt to see if he is interested in the Cardiac Rehab program. If interested, pt will need to complete f/u appt on. Once completed, pt will be contacted for scheduling upon review by the RN Navigator.

## 2019-06-26 ENCOUNTER — Other Ambulatory Visit: Payer: Self-pay

## 2019-06-26 ENCOUNTER — Ambulatory Visit
Admission: RE | Admit: 2019-06-26 | Discharge: 2019-06-26 | Disposition: A | Discharge status: Home / Self Care | Payer: Medicaid Other | Source: Ambulatory Visit | Attending: Radiation Oncology | Admitting: Radiation Oncology

## 2019-06-26 ENCOUNTER — Ambulatory Visit: Payer: Medicaid Other

## 2019-06-26 DIAGNOSIS — C61 Malignant neoplasm of prostate: Secondary | ICD-10-CM | POA: Diagnosis not present

## 2019-06-27 ENCOUNTER — Ambulatory Visit: Payer: Medicaid Other

## 2019-06-27 ENCOUNTER — Ambulatory Visit
Admission: RE | Admit: 2019-06-27 | Discharge: 2019-06-27 | Disposition: A | Discharge status: Home / Self Care | Payer: Medicaid Other | Source: Ambulatory Visit | Attending: Radiation Oncology | Admitting: Radiation Oncology

## 2019-06-27 ENCOUNTER — Other Ambulatory Visit: Payer: Self-pay

## 2019-06-27 DIAGNOSIS — C61 Malignant neoplasm of prostate: Secondary | ICD-10-CM | POA: Diagnosis not present

## 2019-06-29 ENCOUNTER — Ambulatory Visit: Payer: Medicaid Other

## 2019-06-30 ENCOUNTER — Ambulatory Visit
Admission: RE | Admit: 2019-06-30 | Discharge: 2019-06-30 | Disposition: A | Discharge status: Home / Self Care | Payer: Medicaid Other | Source: Ambulatory Visit | Attending: Radiation Oncology | Admitting: Radiation Oncology

## 2019-06-30 ENCOUNTER — Other Ambulatory Visit: Payer: Self-pay

## 2019-06-30 DIAGNOSIS — C61 Malignant neoplasm of prostate: Secondary | ICD-10-CM | POA: Diagnosis not present

## 2019-06-30 NOTE — Progress Notes (Signed)
Cardiology Office Note   Date:  07/01/2019   ID:  Raymond [name], DOB 1960/01/11, MRN [id number]  PCP:  Raymond Mart, DO  Cardiologist: Dr. Burt Knack CC: TOC follow up   History of Present Illness: Raymond [name] is a 59 y.o. male who presents for posthospitalization follow-up after admission for out of hospital ventricular fibrillation and cardiac arrest requiring CPR.  Patient was found to have NSTEMI, with emergent cardiac catheterization.  This was performed by Dr. Claiborne Billings on 06/14/2019.  Cardiac catheterization revealed 85% proximal circumflex stenosis and a very large vessel.  PCI was completed reducing stenosis to 0%.  He had a normal LAD and RCA.  He was recommended for dual antiplatelet therapy for 1 year with aspirin and Brilinta.  During hospitalization he was found to have aspiration pneumonia and hematuria.  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  He was started on Unasyn antibiotics and transition to Augmentin.  He underwent a ureteroscopy and balloon dilatation of the bladder neck on 06/15/2019.   Follow-up echocardiogram was completed, on 06/15/2019 was normal LV systolic function of 55 to 60%.  No regional wall motion abnormalities.  He is without cardiac complaints today he does have some chest soreness with deep breathing and he does have some rib soreness.  He remembers very little about his admission to the hospital.  He remembers mowing his daughter's lawn and going inside the house to cool off, and then waking up in the hospital.  He denies any bleeding, significant dyspnea on exertion, dizziness, near syncope, or severe chest discomfort.  As above he does have some discomfort with inspiration feeling it in his ribs.  He is medically compliant.  He is to follow-up with his primary care.  Past Medical History:  Diagnosis Date   Arthritis    At risk for sleep apnea    STOP-BANG= 4    SENT TO PCP 07-09-2014   BPH (benign prostatic hypertrophy)    Cardiac arrest  ([place]) 06/14/2019   Dental caries    periodontitis   Depression    Elevated PSA    History of concussion    yrs ago-- no LOC-- no residual   History of depression    Hypertension    Nocturia    Prostate cancer ([place])    Wears glasses     Past Surgical History:  Procedure Laterality Date   APPENDECTOMY  age 22   CORONARY STENT INTERVENTION N/A 06/14/2019   Procedure: CORONARY STENT INTERVENTION;  Surgeon: [name], MD;  Location: [place] [facility];  Service: Cardiovascular;  Laterality: N/A;   CORONARY/GRAFT ACUTE MI REVASCULARIZATION N/A 06/14/2019   Procedure: Coronary/Graft Acute MI Revascularization;  Surgeon: [name], MD;  Location: [place] [facility];  Service: Cardiovascular;  Laterality: N/A;   CYSTOSCOPY WITH URETHRAL DILATATION N/A 06/15/2019   Procedure: CYSTOSCOPY WITH BALLOON URETHRAL DILATATION;  Surgeon: [name], MD;  Location: [place];  Service: Urology;  Laterality: N/A;   LEFT HEART CATH AND CORONARY ANGIOGRAPHY N/A 06/14/2019   Procedure: LEFT HEART CATH AND CORONARY ANGIOGRAPHY;  Surgeon: [name], MD;  Location: [place] [facility];  Service: Cardiovascular;  Laterality: N/A;   MULTIPLE EXTRACTIONS WITH ALVEOLOPLASTY N/A 10/12/2017   Procedure: MULTIPLE EXTRACTION WITH ALVEOLOPLASTY;  Surgeon: [name], DDS;  Location: [place];  Service: Oral Surgery;  Laterality: N/A;   PELVIC LYMPH NODE DISSECTION Bilateral 11/04/2018   Procedure: PELVIC LYMPH NODE DISSECTION;  Surgeon: Marton Redwood  III, MD;  Location: [facility];  Service: Urology;  Laterality: Bilateral;   PROSTATE BIOPSY N/A 07/15/2014   Procedure: SATURATION BIOPSY TRANSRECTAL ULTRASONIC PROSTATE (TUBP);  Surgeon: [name], MD;  Location: [facility];  Service: Urology;  Laterality: N/A;   PROSTATE BIOPSY N/A 08/07/2018   Procedure: BIOPSY TRANSRECTAL ULTRASONIC PROSTATE (TUBP);  Surgeon: [name], MD;  Location: [facility];  Service: Urology;  Laterality: N/A;  ONLY NEEDS 30 MIN   RIGHT KNEE ARTHROSCOPY /  MENISECTOMY/  DEBRIDEMENT OF CHONDROMALACIA PATELLA AND MEDIAL SHELF PLICA/  SUBCHONDROPLASTY  06-10-2010   ROBOT ASSISTED LAPAROSCOPIC RADICAL PROSTATECTOMY N/A 11/04/2018   Procedure: XI ROBOTIC ASSISTED LAPAROSCOPIC RADICAL PROSTATECTOMY;  Surgeon: [name], MD;  [facility];  Service: Urology;  Laterality: N/A;   TOTAL KNEE ARTHROPLASTY  05/31/2011   Procedure: TOTAL KNEE ARTHROPLASTY;  Surgeon: [name], MD;  Location: [place];  Service: Orthopedics;  Laterality: Right;  with revision stem     Current Outpatient Medications  Medication Sig Dispense Refill   aspirin 81 MG chewable tablet Chew 1 tablet (81 mg total) by mouth daily. 90 tablet 1   atorvastatin (LIPITOR) 80 MG tablet Take 1 tablet (80 mg total) by mouth daily. 90 tablet 1   gemfibrozil (LOPID) 600 MG tablet Take 600 mg by mouth 2 (two) times daily.  3   lisinopril (ZESTRIL) 10 MG tablet Take 1 tablet (10 mg total) by mouth daily. 90 tablet 1   metoprolol tartrate (LOPRESSOR) 25 MG tablet Take 1 tablet (25 mg total) by mouth 2 (two) times daily. 180 tablet 1   sildenafil (REVATIO) 20 MG tablet Take 20 mg by mouth daily as needed (ED).     ticagrelor (BRILINTA) 90 MG TABS tablet Take 1 tablet (90 mg total) by mouth 2 (two) times daily. 60 tablet 11   potassium chloride (KLOR-CON) 10 MEQ tablet Take 1 tablet (10 mEq total) by mouth daily. 90 tablet 3   No current facility-administered medications for this visit.   Facility-Administered Medications Ordered in Other Visits  Medication Dose Route Frequency Provider Last Rate Last Admin   sodium phosphate (FLEET) 7-19 GM/118ML enema 1 enema  1 enema Rectal Once Winter, Christopher Aaron, MD        Allergies:   Patient has no known allergies.    Social History:  The patient  reports that he quit smoking about 10 months ago. His smoking use included  cigarettes. He has a 2.00 pack-year smoking history. He has never used smokeless tobacco. He reports current alcohol use. He reports that he does not use drugs.   Family History:  The patient's family history is not on file.    ROS: All other systems are reviewed and negative. Unless otherwise mentioned in H&P    PHYSICAL EXAM: VS:  BP (!) 144/78    Pulse 65    Ht '5\' 11"'  (1.803 m)    Wt 184 lb (83.5 kg)    SpO2 98%    BMI 25.66 kg/m  , BMI Body mass index is 25.66 kg/m. GEN: Well nourished, well developed, in no acute distress HEENT: normal Neck: no JVD, carotid bruits, or masses Cardiac:RRR; 1/6 systolic murmurs, rubs, or gallops,no edema  Respiratory:  Clear to auscultation bilaterally, normal work of breathing.  No crackles or wheezes noted.  Some wincing with deep inspiration GI: soft, nontender, nondistended, + BS MS: no deformity or atrophy Skin: warm and dry, no rash Neuro:  Strength and sensation are intact.  Some memory deficit Psych: euthymic mood, full affect   EKG: Not completed this office visit  Recent Labs: 06/14/2019: ALT 104; B Natriuretic Peptide 40.2 06/19/2019: Hemoglobin 10.4; Platelets 222 06/20/2019: BUN 13; Creatinine, Ser 1.14; Magnesium 2.3; Potassium 3.3; Sodium 138    Lipid Panel    Component Value Date/Time   CHOL 129 06/16/2019 1122   TRIG 370 (H) 06/17/2019 0629   HDL 27 (L) 06/16/2019 1122   CHOLHDL 4.8 06/16/2019 1122   VLDL 74 (H) 06/16/2019 1122   LDLCALC 28 06/16/2019 1122      Wt Readings from Last 3 Encounters:  07/01/19 184 lb (83.5 kg)  06/20/19 167 lb (75.8 kg)  11/04/18 184 lb 6.4 oz (83.6 kg)      Other studies Reviewed: 2D echocardiogram 06/15/2019: IMPRESSIONS    1. Left ventricular ejection fraction, by estimation, is 55 to 60%. The  left ventricle has normal function. The left ventricle has no regional  wall motion abnormalities. There is mild left ventricular hypertrophy.  Left ventricular diastolic parameters  are  indeterminate.  2. Right ventricular systolic function is normal. The right ventricular  size is normal.  3. The mitral valve is normal in structure. No evidence of mitral valve  regurgitation. No evidence of mitral stenosis.  4. The aortic valve is tricuspid. Aortic valve regurgitation is not  visualized. No aortic stenosis is present.   Cardiac catheterization 06/14/2019: Conclusion    Post intervention, there is a 0% residual stenosis.  Prox Cx to Mid Cx lesion is 85% stenosed.  A stent was successfully placed.  Out of hospital cardiac arrest most likely due to transient occlusion of the proximal circumflex with reperfusion and evidence for 85% proximal stenosis with TIMI-3 flow.  Normal LAD and right coronary artery.  Preserved global LV contractility with LVEDP 10 mmHg.  Successful PCI to the proximal circumflex vessel with PTCA and ultimate insertion of a 3.5 x 15 mm Resolute DES stent with the stenosis being reduced to 0%.  RECOMMENDATION: DAPT for minimum of a year. Hypothermia protocol will be instituted. We will continue IV Cangrelor. Initiate Brilinta via NG tube once the patient is in the CCU with discontinuance of Cangrelor 30 to 60 minutes post Brilinta administration. We will continue bivalirudin for 2 hours post procedure. 2D echo Doppler study in a.m.    ASSESSMENT AND PLAN:  1.  Coronary artery disease: Status post out-of-hospital arrest, requiring CPR.  Emergent cardiac catheterization with 85% proximal stenosis of the circumflex requiring drug-eluting stent.  Now on dual antiplatelet therapy with ticagrelor and aspirin.  I have given him samples of ticagrelor along with a patient assistant form.  I have reviewed his catheterization report with him and discussed the importance of taking antiplatelet therapy without skipping doses.  He verbalizes understanding.  Continue metoprolol and statin therapy.  2.  Hypertension: Blood pressure moderately  controlled currently.  He has been taken off of lisinopril HCTZ and placed on lisinopril 10 mg daily.  If systolic blood pressure remains greater than 140 on follow-up may need to consider going up on lisinopril to 20 mg as he ha had been in the past.  It is noticed on recent lab work that his potassium was 3.3.  I will start 10 mEq of potassium daily and follow-up with a be met in 1 week.  3.  Hypercholesterolemia: He will continue on high intensity atorvastatin 80 mg daily.  He will need to have follow-up lipids and  LFTs in 3 months with goal of LDL less than 70.  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  He will follow-up with his PCP.  Hopefully this will clear as time passes.  If not he may need to have further evaluation and testing. Had been exposed to begin Current medicines are reviewed at length with the patient today.  I have spent 25 minutes dedicated to the care of this patient on the date of this encounter to include pre-visit review of records, assessment, management and diagnostic testing,with shared decision making.  Labs/ tests ordered today include: BMET  Phill Myron. [place] Pugh, ANP, AACC   07/01/2019 5:00 PM    [place] [address] Office [phone] Fax [phone]  Notice: This dictation was prepared with Dragon dictation along with smaller phrase technology. Any transcriptional errors that result from this process are unintentional and may not be corrected upon review.

## 2019-07-01 ENCOUNTER — Encounter: Payer: Self-pay | Admitting: Adult Health

## 2019-07-01 ENCOUNTER — Ambulatory Visit (INDEPENDENT_AMBULATORY_CARE_PROVIDER_SITE_OTHER): Payer: Medicaid Other | Admitting: Adult Health

## 2019-07-01 ENCOUNTER — Other Ambulatory Visit: Payer: Self-pay

## 2019-07-01 ENCOUNTER — Ambulatory Visit
Admission: RE | Admit: 2019-07-01 | Discharge: 2019-07-01 | Disposition: A | Discharge status: Home / Self Care | Payer: Medicaid Other | Source: Ambulatory Visit | Attending: Radiation Oncology | Admitting: Radiation Oncology

## 2019-07-01 VITALS — BP 144/78 | HR 65 | Ht 71.0 in | Wt 184.0 lb

## 2019-07-01 DIAGNOSIS — Z79899 Other long term (current) drug therapy: Secondary | ICD-10-CM

## 2019-07-01 DIAGNOSIS — I10 Essential (primary) hypertension: Secondary | ICD-10-CM | POA: Diagnosis not present

## 2019-07-01 DIAGNOSIS — J9601 Acute respiratory failure with hypoxia: Secondary | ICD-10-CM

## 2019-07-01 DIAGNOSIS — K047 Periapical abscess without sinus: Secondary | ICD-10-CM

## 2019-07-01 DIAGNOSIS — I2510 Atherosclerotic heart disease of native coronary artery without angina pectoris: Secondary | ICD-10-CM | POA: Diagnosis not present

## 2019-07-01 DIAGNOSIS — J9602 Acute respiratory failure with hypercapnia: Secondary | ICD-10-CM

## 2019-07-01 DIAGNOSIS — I469 Cardiac arrest, cause unspecified: Secondary | ICD-10-CM

## 2019-07-01 DIAGNOSIS — C61 Malignant neoplasm of prostate: Secondary | ICD-10-CM | POA: Diagnosis not present

## 2019-07-01 DIAGNOSIS — E7800 Pure hypercholesterolemia, unspecified: Secondary | ICD-10-CM

## 2019-07-01 MED ORDER — POTASSIUM CHLORIDE ER 10 MEQ PO TBCR: 10.0000 meq | EXTENDED_RELEASE_TABLET | Freq: Every day | ORAL | 3 refills | Status: DC

## 2019-07-01 NOTE — Patient Instructions (Signed)
Medication Instructions:  START- Potassium 10 meq by mouth daily  *If you need a refill on your cardiac medications before your next appointment, please call your pharmacy*   Lab Work: BMP in 1 week  If you have labs (blood work) drawn today and your tests are completely normal, you will receive your results only by:  [place] (if you have MyChart) OR  A paper copy in the mail If you have any lab test that is abnormal or we need to change your treatment, we will call you to review the results.   Testing/Procedures: None ordered   Follow-Up: At [facility], you and your health needs are our priority.  As part of our continuing mission to provide you with exceptional heart care, we have created designated Provider Care Teams.  These Care Teams include your primary Cardiologist (physician) and Advanced Practice Providers (APPs -  Physician Assistants and Nurse Practitioners) who all work together to provide you with the care you need, when you need it.  We recommend signing up for the patient portal called "MyChart".  Sign up information is provided on this After Visit Summary.  MyChart is used to connect with patients for Virtual Visits (Telemedicine).  Patients are able to view lab/test results, encounter notes, upcoming appointments, etc.  Non-urgent messages can be sent to your provider as well.   To learn more about what you can do with MyChart, go to [email or web address].    Your next appointment:   3 month(s)  The format for your next appointment:   In Person  Provider:   You may see Shelva Majestic, MD or one of the following Advanced Practice Providers on your designated Care Team:    Almyra Deforest, PA-C  Fabian Sharp, PA-C or   Roby Lofts, [place]

## 2019-07-02 ENCOUNTER — Ambulatory Visit
Admission: RE | Admit: 2019-07-02 | Discharge: 2019-07-02 | Disposition: A | Discharge status: Home / Self Care | Payer: Medicaid Other | Source: Ambulatory Visit | Attending: Radiation Oncology | Admitting: Radiation Oncology

## 2019-07-02 ENCOUNTER — Other Ambulatory Visit: Payer: Self-pay

## 2019-07-02 DIAGNOSIS — C61 Malignant neoplasm of prostate: Secondary | ICD-10-CM | POA: Diagnosis not present

## 2019-07-03 ENCOUNTER — Other Ambulatory Visit: Payer: Self-pay

## 2019-07-03 ENCOUNTER — Ambulatory Visit
Admission: RE | Admit: 2019-07-03 | Discharge: 2019-07-03 | Disposition: A | Discharge status: Home / Self Care | Payer: Medicaid Other | Source: Ambulatory Visit | Attending: Radiation Oncology | Admitting: Radiation Oncology

## 2019-07-03 DIAGNOSIS — C61 Malignant neoplasm of prostate: Secondary | ICD-10-CM | POA: Diagnosis not present

## 2019-07-04 ENCOUNTER — Other Ambulatory Visit: Payer: Self-pay

## 2019-07-04 ENCOUNTER — Ambulatory Visit
Admission: RE | Admit: 2019-07-04 | Discharge: 2019-07-04 | Disposition: A | Discharge status: Home / Self Care | Payer: Medicaid Other | Source: Ambulatory Visit | Attending: Radiation Oncology | Admitting: Radiation Oncology

## 2019-07-04 DIAGNOSIS — C61 Malignant neoplasm of prostate: Secondary | ICD-10-CM | POA: Diagnosis not present

## 2019-07-07 ENCOUNTER — Ambulatory Visit: Payer: Medicaid Other

## 2019-07-07 ENCOUNTER — Ambulatory Visit
Admission: RE | Admit: 2019-07-07 | Discharge: 2019-07-07 | Disposition: A | Discharge status: Home / Self Care | Payer: Medicaid Other | Source: Ambulatory Visit | Attending: Radiation Oncology | Admitting: Radiation Oncology

## 2019-07-07 ENCOUNTER — Other Ambulatory Visit: Payer: Self-pay

## 2019-07-07 DIAGNOSIS — C61 Malignant neoplasm of prostate: Secondary | ICD-10-CM | POA: Diagnosis not present

## 2019-07-08 ENCOUNTER — Ambulatory Visit: Payer: Medicaid Other

## 2019-07-08 ENCOUNTER — Ambulatory Visit
Admission: RE | Admit: 2019-07-08 | Discharge: 2019-07-08 | Disposition: A | Discharge status: Home / Self Care | Payer: Medicaid Other | Source: Ambulatory Visit | Attending: Radiation Oncology | Admitting: Radiation Oncology

## 2019-07-08 DIAGNOSIS — C61 Malignant neoplasm of prostate: Secondary | ICD-10-CM | POA: Diagnosis not present

## 2019-07-09 ENCOUNTER — Ambulatory Visit
Admission: RE | Admit: 2019-07-09 | Discharge: 2019-07-09 | Disposition: A | Discharge status: Home / Self Care | Payer: Medicaid Other | Source: Ambulatory Visit | Attending: Radiation Oncology | Admitting: Radiation Oncology

## 2019-07-09 ENCOUNTER — Other Ambulatory Visit: Payer: Self-pay

## 2019-07-09 ENCOUNTER — Ambulatory Visit: Payer: Medicaid Other

## 2019-07-09 DIAGNOSIS — C61 Malignant neoplasm of prostate: Secondary | ICD-10-CM | POA: Diagnosis not present

## 2019-07-10 ENCOUNTER — Ambulatory Visit: Payer: Medicaid Other

## 2019-07-10 ENCOUNTER — Ambulatory Visit
Admission: RE | Admit: 2019-07-10 | Discharge: 2019-07-10 | Disposition: A | Discharge status: Home / Self Care | Payer: Medicaid Other | Source: Ambulatory Visit | Attending: Radiation Oncology | Admitting: Radiation Oncology

## 2019-07-10 DIAGNOSIS — C61 Malignant neoplasm of prostate: Secondary | ICD-10-CM | POA: Diagnosis not present

## 2019-07-11 ENCOUNTER — Ambulatory Visit
Admission: RE | Admit: 2019-07-11 | Discharge: 2019-07-11 | Disposition: A | Discharge status: Home / Self Care | Payer: Medicaid Other | Source: Ambulatory Visit | Attending: Radiation Oncology | Admitting: Radiation Oncology

## 2019-07-11 ENCOUNTER — Other Ambulatory Visit: Payer: Self-pay

## 2019-07-11 ENCOUNTER — Ambulatory Visit: Payer: Medicaid Other

## 2019-07-11 DIAGNOSIS — C61 Malignant neoplasm of prostate: Secondary | ICD-10-CM | POA: Diagnosis not present

## 2019-07-14 ENCOUNTER — Ambulatory Visit: Payer: Medicaid Other

## 2019-07-15 ENCOUNTER — Ambulatory Visit
Admission: RE | Admit: 2019-07-15 | Discharge: 2019-07-15 | Disposition: A | Discharge status: Home / Self Care | Payer: Medicaid Other | Source: Ambulatory Visit | Attending: Radiation Oncology | Admitting: Radiation Oncology

## 2019-07-15 ENCOUNTER — Other Ambulatory Visit: Payer: Self-pay

## 2019-07-15 ENCOUNTER — Ambulatory Visit: Payer: Medicaid Other

## 2019-07-15 DIAGNOSIS — C61 Malignant neoplasm of prostate: Secondary | ICD-10-CM | POA: Diagnosis not present

## 2019-07-16 ENCOUNTER — Ambulatory Visit
Admission: RE | Admit: 2019-07-16 | Discharge: 2019-07-16 | Disposition: A | Discharge status: Home / Self Care | Payer: Medicaid Other | Source: Ambulatory Visit | Attending: Radiation Oncology | Admitting: Radiation Oncology

## 2019-07-16 ENCOUNTER — Other Ambulatory Visit: Payer: Self-pay

## 2019-07-16 ENCOUNTER — Encounter: Payer: Self-pay | Admitting: Radiation Oncology

## 2019-07-16 DIAGNOSIS — C61 Malignant neoplasm of prostate: Secondary | ICD-10-CM | POA: Diagnosis not present

## 2019-07-21 ENCOUNTER — Encounter: Payer: Self-pay | Admitting: Medical Oncology

## 2019-10-02 NOTE — Progress Notes (Deleted)
Cardiology Office Note   Date:  10/02/2019   ID:  Raymond [name], DOB 09/03/1960, MRN [id number]  PCP:  Gevena Mart, DO  Cardiologist:  Shelva Majestic, MD EP: None  No chief complaint on file.     History of Present Illness: Raymond [name] is a 59 y.o. male with a PMH of CAD s/p VF arrest with PCI/DES to LCx 06/2019, HTN, HLD, prostate cancer s/p XRT, and remote substance abuse who presents for ***  Patient experienced an out of hospital VF arrest 06/2019 with successful CPR and defibrillation. Emergent heart catheterization showed 85% p-mLCx stenosis managed with PCI/DES, otherwise normal coronaries. Echo showed EF 55-60%, mild LVH, indeterminate LV diastolic function, no RWMA, and no significant valvular abnormalities. His hospital course was complicated by aspiration PNA managed with antibiotics and  hematuria 2/2 urethral trauma for which urology performed a urethroscopy and balloon dilation.   He was last evaluated by cardiology at an outpatient visit with Jory Sims, NP 06/2019 for post-hospital follow-up. He was doing well at that time with some residual chest/rib soreness following CPR but otherwise no exertional chest pain, SOB, DOE, dizziness, lightheadedness, or syncope. He was started on potassium 10 mEq daily given recent K 3.3 and recommended to monitor BP closely to determine if his lisinopril needed to be increased. He was recommended to follow-up in 3 months. Since that time he has completed his XRT therapy for his prostate cancer.   He presents today for routine follow-up.  1. CAD s/p VF arrest with PCI/DES to LCx 06/2019: no anginal complaints - Continue aspirin and brilinta - Continue statin - Continue BBlocker  2. HTN: BP *** today - Continue lisinopril and metoprolol  3. HLD: LDL 28; triglycerides 368 06/2019 - Continue atorvastatin and gemfibrozil      Past Medical History:  Diagnosis Date  . Arthritis   . At risk for sleep apnea    STOP-BANG= 4     SENT TO PCP 07-09-2014  . BPH (benign prostatic hypertrophy)   . Cardiac arrest ([place]) 06/14/2019  . Dental caries    periodontitis  . Depression   . Elevated PSA   . History of concussion    yrs ago-- no LOC-- no residual  . History of depression   . Hypertension   . Nocturia   . Prostate cancer ([place])   . Wears glasses     Past Surgical History:  Procedure Laterality Date  . APPENDECTOMY  age 20  . CORONARY STENT INTERVENTION N/A 06/14/2019   Procedure: CORONARY STENT INTERVENTION;  Surgeon: [name], MD;  Location: [place] [facility];  Service: Cardiovascular;  Laterality: N/A;  . CORONARY/GRAFT ACUTE MI REVASCULARIZATION N/A 06/14/2019   Procedure: Coronary/Graft Acute MI Revascularization;  Surgeon: [name], MD;  Location: [place] [facility];  Service: Cardiovascular;  Laterality: N/A;  . CYSTOSCOPY WITH URETHRAL DILATATION N/A 06/15/2019   Procedure: CYSTOSCOPY WITH BALLOON URETHRAL DILATATION;  Surgeon: [name], MD;  Location: [place];  Service: Urology;  Laterality: N/A;  . LEFT HEART CATH AND CORONARY ANGIOGRAPHY N/A 06/14/2019   Procedure: LEFT HEART CATH AND CORONARY ANGIOGRAPHY;  Surgeon: [name], MD;  Location: [place] [facility];  Service: Cardiovascular;  Laterality: N/A;  . MULTIPLE EXTRACTIONS WITH ALVEOLOPLASTY N/A 10/12/2017   Procedure: MULTIPLE EXTRACTION WITH ALVEOLOPLASTY;  Surgeon: [name], DDS;  Location: [place];  Service: Oral Surgery;  Laterality: N/A;  . PELVIC LYMPH NODE DISSECTION Bilateral 11/04/2018  Procedure: PELVIC LYMPH NODE DISSECTION;  Surgeon: [name], MD;  [facility];  Service: Urology;  Laterality: Bilateral;  . PROSTATE BIOPSY N/A 07/15/2014   Procedure: SATURATION BIOPSY TRANSRECTAL ULTRASONIC PROSTATE (TUBP);  Surgeon: [name], MD;  Location[facility];  Service: Urology;  Laterality: N/A;  . PROSTATE BIOPSY N/A 08/07/2018   Procedure: BIOPSY TRANSRECTAL ULTRASONIC PROSTATE  (TUBP);  Surgeon: [name], MD;  Location: [facility];  Service: Urology;  Laterality: N/A;  ONLY NEEDS 30 MIN  . RIGHT KNEE ARTHROSCOPY /  MENISECTOMY/  DEBRIDEMENT OF CHONDROMALACIA PATELLA AND MEDIAL SHELF PLICA/  SUBCHONDROPLASTY  06-10-2010  . ROBOT ASSISTED LAPAROSCOPIC RADICAL PROSTATECTOMY N/A 11/04/2018   Procedure: XI ROBOTIC ASSISTED LAPAROSCOPIC RADICAL PROSTATECTOMY;  Surgeon: [name], MD;  [facility];  Service: Urology;  Laterality: N/A;  . TOTAL KNEE ARTHROPLASTY  05/31/2011   Procedure: TOTAL KNEE ARTHROPLASTY;  Surgeon: [name], MD;  Location: [place];  Service: Orthopedics;  Laterality: Right;  with revision stem     Current Outpatient Medications  Medication Sig Dispense Refill  . aspirin 81 MG chewable tablet Chew 1 tablet (81 mg total) by mouth daily. 90 tablet 1  . atorvastatin (LIPITOR) 80 MG tablet Take 1 tablet (80 mg total) by mouth daily. 90 tablet 1  . gemfibrozil (LOPID) 600 MG tablet Take 600 mg by mouth 2 (two) times daily.  3  . lisinopril (ZESTRIL) 10 MG tablet Take 1 tablet (10 mg total) by mouth daily. 90 tablet 1  . metoprolol tartrate (LOPRESSOR) 25 MG tablet Take 1 tablet (25 mg total) by mouth 2 (two) times daily. 180 tablet 1  . potassium chloride (KLOR-CON) 10 MEQ tablet Take 1 tablet (10 mEq total) by mouth daily. 90 tablet 3  . sildenafil (REVATIO) 20 MG tablet Take 20 mg by mouth daily as needed (ED).    . ticagrelor (BRILINTA) 90 MG TABS tablet Take 1 tablet (90 mg total) by mouth 2 (two) times daily. 60 tablet 11   No current facility-administered medications for this visit.   Facility-Administered Medications Ordered in Other Visits  Medication Dose Route Frequency Provider Last Rate Last Admin  . sodium phosphate (FLEET) 7-19 GM/118ML enema 1 enema  1 enema Rectal Once Winter, Conception Oms, MD        Allergies:   Patient has no known allergies.    Social History:  The patient   reports that he quit smoking about 13 months ago. His smoking use included cigarettes. He has a 2.00 pack-year smoking history. He has never used smokeless tobacco. He reports current alcohol use. He reports that he does not use drugs.   Family History:  The patient's ***family history is not on file.    ROS:  Please see the history of present illness.   Otherwise, review of systems are positive for {NONE DEFAULTED:18576::"none"}.   All other systems are reviewed and negative.    PHYSICAL EXAM: VS:  There were no vitals taken for this visit. , BMI There is no height or weight on file to calculate BMI. GEN: Well nourished, well developed, in no acute distress HEENT: normal Neck: no JVD, carotid bruits, or masses Cardiac: ***RRR; no murmurs, rubs, or gallops,no edema  Respiratory:  clear to auscultation bilaterally, normal work of breathing GI: soft, nontender, nondistended, + BS MS: no deformity or atrophy Skin: warm and dry, no rash Neuro:  Strength and sensation are intact Psych: euthymic mood,  full affect   EKG:  EKG {ACTION; IS/IS ZHY:[id number]} ordered today. The ekg ordered today demonstrates ***   Recent Labs: 06/14/2019: ALT 104; B Natriuretic Peptide 40.2 06/19/2019: Hemoglobin 10.4; Platelets 222 06/20/2019: BUN 13; Creatinine, Ser 1.14; Magnesium 2.3; Potassium 3.3; Sodium 138    Lipid Panel    Component Value Date/Time   CHOL 129 06/16/2019 1122   TRIG 370 (H) 06/17/2019 0629   HDL 27 (L) 06/16/2019 1122   CHOLHDL 4.8 06/16/2019 1122   VLDL 74 (H) 06/16/2019 1122   LDLCALC 28 06/16/2019 1122      Wt Readings from Last 3 Encounters:  07/01/19 184 lb (83.5 kg)  06/20/19 167 lb (75.8 kg)  11/04/18 184 lb 6.4 oz (83.6 kg)      Other studies Reviewed: Additional studies/ records that were reviewed today include:   LHC 06/14/19  Post intervention, there is a 0% residual stenosis.  Prox Cx to Mid Cx lesion is 85% stenosed.  A stent was successfully  placed.  Out of hospital cardiac arrest most likely due to transient occlusion of the proximal circumflex with reperfusion and evidence for 85% proximal stenosis with TIMI-3 flow.  Normal LAD and right coronary artery.  Preserved global LV contractility with LVEDP 10 mmHg.  Successful PCI to the proximal circumflex vessel with PTCA and ultimate insertion of a 3.5 x 15 mm Resolute DES stent with the stenosis being reduced to 0%.  RECOMMENDATION: DAPT for minimum of a year. Hypothermia protocol will be instituted. We will continue IV Cangrelor. Initiate Brilinta via NG tube once the patient is in the CCU with discontinuance of Cangrelor 30 to 60 minutes post Brilinta administration. We will continue bivalirudin for 2 hours post procedure. 2D echo Doppler study in a.m.  2D echo 06/15/19 MPRESSIONS  1. Left ventricular ejection fraction, by estimation, is 55 to 60%. The  left ventricle has normal function. The left ventricle has no regional  wall motion abnormalities. There is mild left ventricular hypertrophy.  Left ventricular diastolic parameters  are indeterminate.  2. Right ventricular systolic function is normal. The right ventricular  size is normal.  3. The mitral valve is normal in structure. No evidence of mitral valve  regurgitation. No evidence of mitral stenosis.  4. The aortic valve is tricuspid. Aortic valve regurgitation is not  visualized. No aortic stenosis is present.     ASSESSMENT AND PLAN:  1.  ***   Current medicines are reviewed at length with the patient today.  The patient {ACTIONS; HAS/DOES NOT HAVE:19233} concerns regarding medicines.  The following changes have been made:  {PLAN; NO CHANGE:13088:s}  Labs/ tests ordered today include: *** No orders of the defined types were placed in this encounter.    Disposition:   FU with *** in {gen number [id number]} {Days to years:10300}  Signed, Abigail Butts, PA-C  10/02/2019 6:32 PM

## 2019-10-09 ENCOUNTER — Ambulatory Visit: Payer: Medicaid Other | Admitting: Medical

## 2019-10-09 NOTE — Progress Notes (Signed)
Virtual Visit via Telephone Note   This visit type was conducted due to national recommendations for restrictions regarding the COVID-19 Pandemic (e.g. social distancing) in an effort to limit this patient's exposure and mitigate transmission in our community.  Due to his co-morbid illnesses, this patient is at least at moderate risk for complications without adequate follow up.  This format is felt to be most appropriate for this patient at this time.  The patient did not have access to video technology/had technical difficulties with video requiring transitioning to audio format only (telephone).  All issues noted in this document were discussed and addressed.  No physical exam could be performed with this format.  Please refer to the patient's chart for his  consent to telehealth for [facility].   Date:  10/10/2019   ID:  Raymond [name], DOB Jun 10, 1960, MRN [id number]  Patient Location: Home Provider Location: Home Office  PCP:  Gevena Mart, DO  Cardiologist:  Shelva Majestic, MD  Electrophysiologist:  None   Evaluation Performed:  Follow-Up Visit  Chief Complaint:   Follow Up  History of Present Illness:    Raymond [name] is a 59 y.o. male with we are following for ongoing assessment and management of coronary artery disease with history of NSTEMI on 06/14/2019 after out of hospital cardiac arrest requiring CPR.  Emergent cardiac catheterization revealed an 85% proximal circumflex stenosis and a very large vessel.  PCI was completed reducing stenosis to 0%.  He had a normal LAD and right coronary artery.  He was recommended for DAPT for 1 year with Brilinta and aspirin.  It was noted during that hospitalization the patient had aspiration pneumonia and hematuria.  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  He was started on antibiotics and transition to p.o. Augmentin on discharge.  He underwent a ureteroscopy and balloon dilatation of the bladder neck on 06/15/2019.  He was last  seen in the office on 07/01/2019 and was without any significant complaints.  He did have some chest soreness with deep breathing and along with some rib soreness.  He remembers very little about his admission to the hospital.  He remembers mowing his daughter's lawn and that was the last thing until he woke up in the hospital.  No changes were made to his medication regimen.  Multiple questions were allowed and answered.  We will be rechecking blood pressure at this visit to be sure that he is not increasing in his readings to keep him at optimal level.  He will need to have fasting lipids and LFTs if not already completed.  Raymond [name] comes today via telephone visit and is feeling great.  He is out walking, spending time with his family.  Recently married his daughter off a few weeks ago.  He is in great spirits.  He is medically compliant.  The patient does not have symptoms concerning for COVID-19 infection (fever, chills, cough, or new shortness of breath).  He has received 2 vaccines and is waiting for the third booster.   Past Medical History:  Diagnosis Date  . Arthritis   . At risk for sleep apnea    STOP-BANG= 4    SENT TO PCP 07-09-2014  . BPH (benign prostatic hypertrophy)   . Cardiac arrest ([place]) 06/14/2019  . Dental caries    periodontitis  . Depression   . Elevated PSA   . History of concussion    yrs ago-- no LOC-- no residual  .  History of depression   . Hypertension   . Nocturia   . Prostate cancer ([place])   . Wears glasses    Past Surgical History:  Procedure Laterality Date  . APPENDECTOMY  age 3  . CORONARY STENT INTERVENTION N/A 06/14/2019   Procedure: CORONARY STENT INTERVENTION;  Surgeon: [name], MD;  Location: [place] [facility];  Service: Cardiovascular;  Laterality: N/A;  . CORONARY/GRAFT ACUTE MI REVASCULARIZATION N/A 06/14/2019   Procedure: Coronary/Graft Acute MI Revascularization;  Surgeon: [name], MD;  Location: [place] [facility];   Service: Cardiovascular;  Laterality: N/A;  . CYSTOSCOPY WITH URETHRAL DILATATION N/A 06/15/2019   Procedure: CYSTOSCOPY WITH BALLOON URETHRAL DILATATION;  Surgeon: [name], MD;  Location: [place];  Service: Urology;  Laterality: N/A;  . LEFT HEART CATH AND CORONARY ANGIOGRAPHY N/A 06/14/2019   Procedure: LEFT HEART CATH AND CORONARY ANGIOGRAPHY;  Surgeon: [name], MD;  Location: [place] [facility];  Service: Cardiovascular;  Laterality: N/A;  . MULTIPLE EXTRACTIONS WITH ALVEOLOPLASTY N/A 10/12/2017   Procedure: MULTIPLE EXTRACTION WITH ALVEOLOPLASTY;  Surgeon: [name], DDS;  Location: [place];  Service: Oral Surgery;  Laterality: N/A;  . PELVIC LYMPH NODE DISSECTION Bilateral 11/04/2018   Procedure: PELVIC LYMPH NODE DISSECTION;  Surgeon: [name], MD;  Location: [facility];  Service: Urology;  Laterality: Bilateral;  . PROSTATE BIOPSY N/A 07/15/2014   Procedure: SATURATION BIOPSY TRANSRECTAL ULTRASONIC PROSTATE (TUBP);  Surgeon: [name], MD;  Location[facility];  Service: Urology;  Laterality: N/A;  . PROSTATE BIOPSY N/A 08/07/2018   Procedure: BIOPSY TRANSRECTAL ULTRASONIC PROSTATE (TUBP);  Surgeon: [name], MD;  Location: [facility];  Service: Urology;  Laterality: N/A;  ONLY NEEDS 30 MIN  . RIGHT KNEE ARTHROSCOPY /  MENISECTOMY/  DEBRIDEMENT OF CHONDROMALACIA PATELLA AND MEDIAL SHELF PLICA/  SUBCHONDROPLASTY  06-10-2010  . ROBOT ASSISTED LAPAROSCOPIC RADICAL PROSTATECTOMY N/A 11/04/2018   Procedure: XI ROBOTIC ASSISTED LAPAROSCOPIC RADICAL PROSTATECTOMY;  Surgeon: [name], MD;  Location: [facility];  Service: Urology;  Laterality: N/A;  . TOTAL KNEE ARTHROPLASTY  05/31/2011   Procedure: TOTAL KNEE ARTHROPLASTY;  Surgeon: [name], MD;  Location: [place];  Service: Orthopedics;  Laterality: Right;  with revision stem     Current Meds  Medication Sig  . aspirin 81 MG chewable tablet Chew 1 tablet (81 mg  total) by mouth daily.  Marland Kitchen atorvastatin (LIPITOR) 80 MG tablet Take 1 tablet (80 mg total) by mouth daily.  Marland Kitchen gemfibrozil (LOPID) 600 MG tablet Take 600 mg by mouth 2 (two) times daily.  Marland Kitchen lisinopril (ZESTRIL) 10 MG tablet Take 1 tablet (10 mg total) by mouth daily.  . metoprolol tartrate (LOPRESSOR) 25 MG tablet Take 12.5 mg by mouth 2 (two) times daily.  . potassium chloride (KLOR-CON) 10 MEQ tablet Take 1 tablet (10 mEq total) by mouth daily.  . sildenafil (REVATIO) 20 MG tablet Take 20 mg by mouth daily as needed (ED).  . ticagrelor (BRILINTA) 90 MG TABS tablet Take 1 tablet (90 mg total) by mouth 2 (two) times daily.  . [DISCONTINUED] metoprolol tartrate (LOPRESSOR) 25 MG tablet Take 1 tablet (25 mg total) by mouth 2 (two) times daily. (Patient taking differently: Take 12.5 mg by mouth 2 (two) times daily. )     Allergies:   Patient has no known allergies.   Social History   Tobacco Use  . Smoking status: Former Smoker  Packs/day: 0.50    Years: 4.00    Pack years: 2.00    Types: Cigarettes    Quit date: 08/28/2018    Years since quitting: 1.1  . Smokeless tobacco: Never Used  Vaping Use  . Vaping Use: Never used  Substance Use Topics  . Alcohol use: Yes    Comment: SOCIAL  . Drug use: Never    Comment: hx marijuana use- none recently states on 10/30/2018     Family Hx: The patient's family history is negative for Prostate cancer, Colon cancer, and Breast cancer.  ROS:   Please see the history of present illness.    All other systems reviewed and are negative.   Prior CV studies:   The following studies were reviewed today: 2D echocardiogram 06/15/2019: IMPRESSIONS    1. Left ventricular ejection fraction, by estimation, is 55 to 60%. The  left ventricle has normal function. The left ventricle has no regional  wall motion abnormalities. There is mild left ventricular hypertrophy.  Left ventricular diastolic parameters  are indeterminate.  2. Right ventricular  systolic function is normal. The right ventricular  size is normal.  3. The mitral valve is normal in structure. No evidence of mitral valve  regurgitation. No evidence of mitral stenosis.  4. The aortic valve is tricuspid. Aortic valve regurgitation is not  visualized. No aortic stenosis is present.   Cardiac catheterization 06/14/2019: Conclusion    Post intervention, there is a 0% residual stenosis.  Prox Cx to Mid Cx lesion is 85% stenosed.  A stent was successfully placed.  Out of hospital cardiac arrest most likely due to transient occlusion of the proximal circumflex with reperfusion and evidence for 85% proximal stenosis with TIMI-3 flow.  Normal LAD and right coronary artery.  Preserved global LV contractility with LVEDP 10 mmHg.  Successful PCI to the proximal circumflex vessel with PTCA and ultimate insertion of a 3.5 x 15 mm Resolute DES stent with the stenosis being reduced to 0%.  RECOMMENDATION: DAPT for minimum of a year. Hypothermia protocol will be instituted. We will continue IV Cangrelor. Initiate Brilinta via NG tube once the patient is in the CCU with discontinuance of Cangrelor 30 to 60 minutes post Brilinta administration. We will continue bivalirudin for 2 hours post procedure. 2D echo Doppler study in a.m.     Labs/Other Tests and Data Reviewed:    EKG:  No ECG reviewed.  Recent Labs: 06/14/2019: ALT 104; B Natriuretic Peptide 40.2 06/19/2019: Hemoglobin 10.4; Platelets 222 06/20/2019: BUN 13; Creatinine, Ser 1.14; Magnesium 2.3; Potassium 3.3; Sodium 138   Recent Lipid Panel Lab Results  Component Value Date/Time   CHOL 129 06/16/2019 11:22 AM   TRIG 370 (H) 06/17/2019 06:29 AM   HDL 27 (L) 06/16/2019 11:22 AM   CHOLHDL 4.8 06/16/2019 11:22 AM   LDLCALC 28 06/16/2019 11:22 AM    Wt Readings from Last 3 Encounters:  10/10/19 186 lb (84.4 kg)  07/01/19 184 lb (83.5 kg)  06/20/19 167 lb (75.8 kg)     Objective:    Vital  Signs:  Ht 5\' 11"  (1.803 m)   Wt 186 lb (84.4 kg)   BMI 25.94 kg/m  Limited due to phone call.  VITAL SIGNS:  reviewed GEN:  no acute distress RESPIRATORY:  normal respiratory effort, symmetric expansion NEURO:  alert and oriented x 3, no obvious focal deficit PSYCH:     ASSESSMENT & PLAN:   1.  Coronary artery disease: History of NSTEMI on 06/14/2019 after  out of hospital arrest.  85% proximal circumflex stenosis to a very large vessel.  PCI was completed with 0% restenosis.  Normal LAD and right coronary artery.  He remains on dual antiplatelet therapy with Brilinta and aspirin.  He offers no cardiac complaints.  He remains medically compliant.  He remains active.  We will have him follow-up again in 6 months at which time can consider discontinuing dual antiplatelet therapy.  2.  Hyperlipidemia: Has recently been seen by his primary care physician Dr. Orma Render, has had labs and is also had a repeat echocardiogram.  We are requesting those records.  Goal of LDL less than 70.  Continue statin therapy.  3. Hypertension: Well controlled. No changes at this time.    COVID-19 Education: The signs and symptoms of COVID-19 were discussed with the patient and how to seek care for testing (follow up with PCP or arrange E-visit).  The importance of social distancing was discussed today. Has had his vaccines and is waiting for booster 3rd dose.   Time:   Today, I have spent 25 minutes with the patient with telehealth technology discussing the above problems.     Medication Adjustments/Labs and Tests Ordered: Current medicines are reviewed at length with the patient today.  Concerns regarding medicines are outlined above.   Tests Ordered: No orders of the defined types were placed in this encounter.   Medication Changes: No orders of the defined types were placed in this encounter.   Disposition:  Follow up 6 months   Signed, Phill Myron. West Pugh, ANP, AACC  10/10/2019 9:52 AM    [facility]

## 2019-10-10 ENCOUNTER — Telehealth (INDEPENDENT_AMBULATORY_CARE_PROVIDER_SITE_OTHER): Payer: Medicaid Other | Admitting: Adult Health

## 2019-10-10 ENCOUNTER — Encounter: Payer: Self-pay | Admitting: Adult Health

## 2019-10-10 VITALS — Ht 71.0 in | Wt 186.0 lb

## 2019-10-10 DIAGNOSIS — I10 Essential (primary) hypertension: Secondary | ICD-10-CM

## 2019-10-10 DIAGNOSIS — I2510 Atherosclerotic heart disease of native coronary artery without angina pectoris: Secondary | ICD-10-CM | POA: Diagnosis not present

## 2019-10-10 DIAGNOSIS — E7800 Pure hypercholesterolemia, unspecified: Secondary | ICD-10-CM | POA: Diagnosis not present

## 2019-10-10 NOTE — Patient Instructions (Signed)
Medication Instructions:  Continue current medications  *If you need a refill on your cardiac medications before your next appointment, please call your pharmacy*   Lab Work: None Ordered   Testing/Procedures: None Ordered   Follow-Up: At [facility], you and your health needs are our priority.  As part of our continuing mission to provide you with exceptional heart care, we have created designated Provider Care Teams.  These Care Teams include your primary Cardiologist (physician) and Advanced Practice Providers (APPs -  Physician Assistants and Nurse Practitioners) who all work together to provide you with the care you need, when you need it.  We recommend signing up for the patient portal called "MyChart".  Sign up information is provided on this After Visit Summary.  MyChart is used to connect with patients for Virtual Visits (Telemedicine).  Patients are able to view lab/test results, encounter notes, upcoming appointments, etc.  Non-urgent messages can be sent to your provider as well.   To learn more about what you can do with MyChart, go to [email or web address].    Your next appointment:   6 month(s)  The format for your next appointment:   In Person  Provider:   You may see Sylvain Hasten Kelly, MD or one of the following Advanced Practice Providers on your designated Care Team:    Hao Meng, PA-C  Angela Duke, PA-C or   Krista Kroeger, PA-C     

## 2020-01-10 NOTE — Progress Notes (Signed)
  Radiation Oncology         ([phone]) [phone] ________________________________  Name: Raymond [name] MRN: [id number]  Date: 07/16/2019  DOB: 11/06/1960  End of Treatment Note  Diagnosis:   60 y.o. gentleman with Stage T1c adenocarcinoma of the prostate with Gleason score of 4+3, and PSA of 42.6.     Indication for treatment:  Curative, Definitive Radiotherapy       Radiation treatment dates:   5/5-07/16/19  Site/dose:  1. The prostate fossa and pelvic lymph nodes were initially treated to 45 Gy in 25 fractions of 1.8 Gy  2. The prostate fossa only was boosted to 68.4 Gy with 13 additional fractions of 1.8 Gy   Beams/energy:  1. The prostate fossa  and pelvic lymph nodes were initially treated using VMAT intensity modulated radiotherapy delivering 6 megavolt photons. Image guidance was performed with CB-CT studies prior to each fraction. He was immobilized with a body fix lower extremity mold.  2. The prostate fossa only was boosted using VMAT intensity modulated radiotherapy delivering 6 megavolt photons. Image guidance was performed with CB-CT studies prior to each fraction. He was immobilized with a body fix lower extremity mold.  Narrative: The patient tolerated radiation treatment relatively well.   The patient experienced some minor urinary irritation and modest fatigue.  He missed a full week of radiation 7/7-7/11  Plan: The patient has completed radiation treatment. He will return to radiation oncology clinic for routine followup in one month. I advised him to call or return sooner if he has any questions or concerns related to his recovery or treatment. ________________________________  Artist Pais. Kathrynn Running, M.D.

## 2020-09-09 ENCOUNTER — Other Ambulatory Visit: Payer: Self-pay | Admitting: Adult Health

## 2020-12-08 ENCOUNTER — Other Ambulatory Visit: Payer: Self-pay | Admitting: Adult Health

## 2021-06-22 ENCOUNTER — Other Ambulatory Visit: Payer: Self-pay

## 2021-06-22 ENCOUNTER — Inpatient Hospital Stay (HOSPITAL_COMMUNITY)
Admission: EM | Admit: 2021-06-22 | Discharge: 2021-06-26 | DRG: 378 | Disposition: A | Discharge status: Home / Self Care | Payer: Medicaid Other | Attending: Internal Medicine | Admitting: Internal Medicine

## 2021-06-22 ENCOUNTER — Encounter (HOSPITAL_COMMUNITY): Payer: Self-pay | Admitting: Radiology

## 2021-06-22 DIAGNOSIS — M1990 Unspecified osteoarthritis, unspecified site: Secondary | ICD-10-CM | POA: Diagnosis present

## 2021-06-22 DIAGNOSIS — Z8674 Personal history of sudden cardiac arrest: Secondary | ICD-10-CM

## 2021-06-22 DIAGNOSIS — Z91128 Patient's intentional underdosing of medication regimen for other reason: Secondary | ICD-10-CM

## 2021-06-22 DIAGNOSIS — Z87820 Personal history of traumatic brain injury: Secondary | ICD-10-CM

## 2021-06-22 DIAGNOSIS — K920 Hematemesis: Secondary | ICD-10-CM

## 2021-06-22 DIAGNOSIS — Z955 Presence of coronary angioplasty implant and graft: Secondary | ICD-10-CM

## 2021-06-22 DIAGNOSIS — I2510 Atherosclerotic heart disease of native coronary artery without angina pectoris: Secondary | ICD-10-CM | POA: Diagnosis present

## 2021-06-22 DIAGNOSIS — I110 Hypertensive heart disease with heart failure: Secondary | ICD-10-CM | POA: Diagnosis present

## 2021-06-22 DIAGNOSIS — F32A Depression, unspecified: Secondary | ICD-10-CM | POA: Diagnosis present

## 2021-06-22 DIAGNOSIS — Z7982 Long term (current) use of aspirin: Secondary | ICD-10-CM

## 2021-06-22 DIAGNOSIS — F17210 Nicotine dependence, cigarettes, uncomplicated: Secondary | ICD-10-CM | POA: Diagnosis present

## 2021-06-22 DIAGNOSIS — K2971 Gastritis, unspecified, with bleeding: Secondary | ICD-10-CM | POA: Diagnosis present

## 2021-06-22 DIAGNOSIS — E872 Acidosis, unspecified: Secondary | ICD-10-CM | POA: Diagnosis present

## 2021-06-22 DIAGNOSIS — K254 Chronic or unspecified gastric ulcer with hemorrhage: Principal | ICD-10-CM | POA: Diagnosis present

## 2021-06-22 DIAGNOSIS — I252 Old myocardial infarction: Secondary | ICD-10-CM

## 2021-06-22 DIAGNOSIS — E785 Hyperlipidemia, unspecified: Secondary | ICD-10-CM | POA: Diagnosis present

## 2021-06-22 DIAGNOSIS — D62 Acute posthemorrhagic anemia: Secondary | ICD-10-CM | POA: Diagnosis present

## 2021-06-22 DIAGNOSIS — K922 Gastrointestinal hemorrhage, unspecified: Secondary | ICD-10-CM | POA: Diagnosis present

## 2021-06-22 DIAGNOSIS — Z7902 Long term (current) use of antithrombotics/antiplatelets: Secondary | ICD-10-CM

## 2021-06-22 DIAGNOSIS — Z8546 Personal history of malignant neoplasm of prostate: Secondary | ICD-10-CM

## 2021-06-22 DIAGNOSIS — Z96651 Presence of right artificial knee joint: Secondary | ICD-10-CM | POA: Diagnosis present

## 2021-06-22 DIAGNOSIS — Z79899 Other long term (current) drug therapy: Secondary | ICD-10-CM

## 2021-06-22 DIAGNOSIS — I5042 Chronic combined systolic (congestive) and diastolic (congestive) heart failure: Secondary | ICD-10-CM | POA: Diagnosis present

## 2021-06-22 DIAGNOSIS — Z9079 Acquired absence of other genital organ(s): Secondary | ICD-10-CM

## 2021-06-22 DIAGNOSIS — Z9049 Acquired absence of other specified parts of digestive tract: Secondary | ICD-10-CM

## 2021-06-22 DIAGNOSIS — Z923 Personal history of irradiation: Secondary | ICD-10-CM

## 2021-06-22 LAB — COMPREHENSIVE METABOLIC PANEL
ALT: 15 U/L (ref 0–44)
AST: 19 U/L (ref 15–41)
Albumin: 2.9 g/dL — ABNORMAL LOW (ref 3.5–5.0)
Alkaline Phosphatase: 24 U/L — ABNORMAL LOW (ref 38–126)
Anion gap: 9 (ref 5–15)
BUN: 24 mg/dL — ABNORMAL HIGH (ref 8–23)
CO2: 21 mmol/L — ABNORMAL LOW (ref 22–32)
Calcium: 8.7 mg/dL — ABNORMAL LOW (ref 8.9–10.3)
Chloride: 113 mmol/L — ABNORMAL HIGH (ref 98–111)
Creatinine, Ser: 1.24 mg/dL (ref 0.61–1.24)
GFR, Estimated: >60 mL/min (ref >60)
Glucose, Bld: 169 mg/dL — ABNORMAL HIGH (ref 70–99)
Potassium: 3.6 mmol/L (ref 3.5–5.1)
Sodium: 143 mmol/L (ref 135–145)
Total Bilirubin: 0.6 mg/dL (ref 0.3–1.2)
Total Protein: 5 g/dL — ABNORMAL LOW (ref 6.5–8.1)

## 2021-06-22 LAB — PREPARE RBC (CROSSMATCH)

## 2021-06-22 LAB — CBC WITH DIFFERENTIAL/PLATELET
Abs Immature Granulocytes: 0.05 10*3/uL (ref 0.00–0.07)
Basophils Absolute: 0 10*3/uL (ref 0.0–0.1)
Basophils Relative: 0 %
Eosinophils Absolute: 0.1 10*3/uL (ref 0.0–0.5)
Eosinophils Relative: 1 %
HCT: 20.7 % — ABNORMAL LOW (ref 39.0–52.0)
Hemoglobin: 6.2 g/dL — CL (ref 13.0–17.0)
Immature Granulocytes: 1 %
Lymphocytes Relative: 13 %
Lymphs Abs: 1 10*3/uL (ref 0.7–4.0)
MCH: 23.9 pg — ABNORMAL LOW (ref 26.0–34.0)
MCHC: 30 g/dL (ref 30.0–36.0)
MCV: 79.9 fL — ABNORMAL LOW (ref 80.0–100.0)
Monocytes Absolute: 0.7 10*3/uL (ref 0.1–1.0)
Monocytes Relative: 9 %
Neutro Abs: 6.2 10*3/uL (ref 1.7–7.7)
Neutrophils Relative %: 76 %
Platelets: 244 10*3/uL (ref 150–400)
RBC: 2.59 MIL/uL — ABNORMAL LOW (ref 4.22–5.81)
RDW: 17.6 % — ABNORMAL HIGH (ref 11.5–15.5)
WBC: 8.1 10*3/uL (ref 4.0–10.5)
nRBC: 0 % (ref 0.0–0.2)

## 2021-06-22 LAB — PROTIME-INR
INR: 1.1 (ref 0.8–1.2)
Prothrombin Time: 14.2 seconds (ref 11.4–15.2)

## 2021-06-22 LAB — LIPASE, BLOOD: Lipase: 33 U/L (ref 11–51)

## 2021-06-22 LAB — POC OCCULT BLOOD, ED: Fecal Occult Bld: POSITIVE — AB

## 2021-06-22 MED ORDER — PANTOPRAZOLE SODIUM 40 MG IV SOLR
40.0000 mg | Freq: Once | INTRAVENOUS | Status: AC
  Administered 2021-06-23: 40 mg via INTRAVENOUS
  Filled 2021-06-22: qty 10

## 2021-06-22 MED ORDER — SODIUM CHLORIDE 0.9 % IV SOLN: 10.0000 mL/h | Freq: Once | INTRAVENOUS | Status: DC

## 2021-06-22 NOTE — ED Provider Notes (Incomplete)
[facility] EMERGENCY DEPARTMENT Provider Note   CSN: [id number] Arrival date & time: 06/22/21  2244     History {Add pertinent medical, surgical, social history, OB history to HPI:1} Chief Complaint  Patient presents with  . GI Bleeding    Raymond [name] is a 61 y.o. male with a past medical history of cardiac disease, hypertension, substance abuse who presents to the emergency department with GI bleed.  Patient reports that he has noticed dark black stools over the past 2 days.  He is on Brilinta and aspirin.  He states that today he stood up and got extremely dizzy.  He had several episodes of vomiting bright red blood and clots.  His daughters who are at bedside states that the floor was covered with blood.  EMS witnessed about 300 mL of blood loss by mouth.  He denies any active abdominal pain.  He has urgency to defecate.  HPI     Home Medications Prior to Admission medications   Medication Sig Start Date End Date Taking? Authorizing Provider  aspirin 81 MG chewable tablet Chew 1 tablet (81 mg total) by mouth daily. 06/21/19   Mercy Riding, MD  atorvastatin (LIPITOR) 80 MG tablet Take 1 tablet (80 mg total) by mouth daily. 06/21/19   Mercy Riding, MD  gemfibrozil (LOPID) 600 MG tablet Take 600 mg by mouth 2 (two) times daily. 09/26/17   [provider]  lisinopril (ZESTRIL) 10 MG tablet Take 1 tablet (10 mg total) by mouth daily. 06/20/19 12/17/19  Mercy Riding, MD  metoprolol tartrate (LOPRESSOR) 25 MG tablet Take 12.5 mg by mouth 2 (two) times daily.    [provider]  potassium chloride (KLOR-CON) 10 MEQ tablet TAKE 1 TABLET(10 MEQ) BY MOUTH 1 TIME FOR 1 DOSE 12/08/20   Sherren Mocha, MD  sildenafil (REVATIO) 20 MG tablet Take 20 mg by mouth daily as needed (ED).    [provider]  ticagrelor (BRILINTA) 90 MG TABS tablet Take 1 tablet (90 mg total) by mouth 2 (two) times daily. 06/20/19   Charlie Pitter, PA-C      Allergies     Patient has no known allergies.    Review of Systems   Review of Systems  Physical Exam Updated Vital Signs BP 112/66 (BP Location: Right Arm)   Pulse 82   Temp 98.4 F (36.9 C) (Oral)   Resp (!) 22   Ht '5\' 11"'$  (1.803 m)   Wt 84.4 kg   SpO2 100%   BMI 25.94 kg/m  Physical Exam Vitals and nursing note reviewed.  Constitutional:      General: He is not in acute distress.    Appearance: He is well-developed. He is not diaphoretic.  HENT:     Head: Normocephalic and atraumatic.  Eyes:     General: No scleral icterus.    Conjunctiva/sclera: Conjunctivae normal.  Cardiovascular:     Rate and Rhythm: Normal rate and regular rhythm.     Heart sounds: Normal heart sounds.  Pulmonary:     Effort: Pulmonary effort is normal. No respiratory distress.     Breath sounds: Normal breath sounds.  Abdominal:     Palpations: Abdomen is soft.     Tenderness: There is no abdominal tenderness.  Musculoskeletal:     Cervical back: Normal range of motion and neck supple.  Skin:    General: Skin is warm and dry.  Neurological:     Mental Status: He is  alert.  Psychiatric:        Behavior: Behavior normal.     ED Results / Procedures / Treatments   Labs (all labs ordered are listed, but only abnormal results are displayed) Labs Reviewed  CBC WITH DIFFERENTIAL/PLATELET - Abnormal; Notable for the following components:      Result Value   RBC 2.59 (*)    Hemoglobin 6.2 (*)    HCT 20.7 (*)    MCV 79.9 (*)    MCH 23.9 (*)    RDW 17.6 (*)    All other components within normal limits  POC OCCULT BLOOD, ED - Abnormal; Notable for the following components:   Fecal Occult Bld POSITIVE (*)    All other components within normal limits  PROTIME-INR  COMPREHENSIVE METABOLIC PANEL  LIPASE, BLOOD  TYPE AND SCREEN    EKG None  Radiology No results found.  Procedures Procedures  {Document cardiac monitor, telemetry assessment procedure when appropriate:1}  Medications Ordered in  ED Medications  pantoprazole (PROTONIX) injection 40 mg (has no administration in time range)    ED Course/ Medical Decision Making/ A&P                           Medical Decision Making Risk Prescription drug management.   ***  {Document critical care time when appropriate:1} {Document review of labs and clinical decision tools ie heart score, Chads2Vasc2 etc:1}  {Document your independent review of radiology images, and any outside records:1} {Document your discussion with family members, caretakers, and with consultants:1} {Document social determinants of health affecting pt's care:1} {Document your decision making why or why not admission, treatments were needed:1} Final Clinical Impression(s) / ED Diagnoses Final diagnoses:  None    Rx / DC Orders ED Discharge Orders     None

## 2021-06-22 NOTE — ED Provider Notes (Signed)
[facility] EMERGENCY DEPARTMENT Provider Note   CSN: [id number] Arrival date & time: 06/22/21  2244     History  Chief Complaint  Patient presents with   GI Bleeding    Raymond [name] is a 61 y.o. male with a past medical history of cardiac disease, hypertension, substance abuse who presents to the emergency department with GI bleed.  Patient reports that he has noticed dark black stools over the past 2 days.  He is on Brilinta and aspirin.  He states that today he stood up and got extremely dizzy.  He had several episodes of vomiting bright red blood and clots.  His daughters who are at bedside states that the floor was covered with blood.  EMS witnessed about 300 mL of blood loss by mouth.  He denies any active abdominal pain.  He has urgency to defecate.  HPI     Home Medications Prior to Admission medications   Medication Sig Start Date End Date Taking? Authorizing Provider  aspirin 81 MG chewable tablet Chew 1 tablet (81 mg total) by mouth daily. 06/21/19   Mercy Riding, MD  atorvastatin (LIPITOR) 80 MG tablet Take 1 tablet (80 mg total) by mouth daily. 06/21/19   Mercy Riding, MD  gemfibrozil (LOPID) 600 MG tablet Take 600 mg by mouth 2 (two) times daily. 09/26/17   [provider]  lisinopril (ZESTRIL) 10 MG tablet Take 1 tablet (10 mg total) by mouth daily. 06/20/19 12/17/19  Mercy Riding, MD  metoprolol tartrate (LOPRESSOR) 25 MG tablet Take 12.5 mg by mouth 2 (two) times daily.    [provider]  potassium chloride (KLOR-CON) 10 MEQ tablet TAKE 1 TABLET(10 MEQ) BY MOUTH 1 TIME FOR 1 DOSE 12/08/20   Sherren Mocha, MD  sildenafil (REVATIO) 20 MG tablet Take 20 mg by mouth daily as needed (ED).    [provider]  ticagrelor (BRILINTA) 90 MG TABS tablet Take 1 tablet (90 mg total) by mouth 2 (two) times daily. 06/20/19   Charlie Pitter, PA-C      Allergies    Patient has no known allergies.    Review of Systems   Review of  Systems  Physical Exam Updated Vital Signs BP 112/66 (BP Location: Right Arm)   Pulse 82   Temp 98.4 F (36.9 C) (Oral)   Resp (!) 22   Ht '5\' 11"'$  (1.803 m)   Wt 84.4 kg   SpO2 100%   BMI 25.94 kg/m  Physical Exam Vitals and nursing note reviewed.  Constitutional:      General: He is not in acute distress.    Appearance: He is well-developed. He is not diaphoretic.  HENT:     Head: Normocephalic and atraumatic.  Eyes:     General: No scleral icterus.    Conjunctiva/sclera: Conjunctivae normal.  Cardiovascular:     Rate and Rhythm: Normal rate and regular rhythm.     Heart sounds: Normal heart sounds.  Pulmonary:     Effort: Pulmonary effort is normal. No respiratory distress.     Breath sounds: Normal breath sounds.  Abdominal:     Palpations: Abdomen is soft.     Tenderness: There is no abdominal tenderness.  Musculoskeletal:     Cervical back: Normal range of motion and neck supple.  Skin:    General: Skin is warm and dry.  Neurological:     Mental Status: He is alert.  Psychiatric:  Behavior: Behavior normal.     ED Results / Procedures / Treatments   Labs (all labs ordered are listed, but only abnormal results are displayed) Labs Reviewed  CBC WITH DIFFERENTIAL/PLATELET - Abnormal; Notable for the following components:      Result Value   RBC 2.59 (*)    Hemoglobin 6.2 (*)    HCT 20.7 (*)    MCV 79.9 (*)    MCH 23.9 (*)    RDW 17.6 (*)    All other components within normal limits  POC OCCULT BLOOD, ED - Abnormal; Notable for the following components:   Fecal Occult Bld POSITIVE (*)    All other components within normal limits  PROTIME-INR  COMPREHENSIVE METABOLIC PANEL  LIPASE, BLOOD  TYPE AND SCREEN    EKG None  Radiology No results found.  Procedures .Critical Care  Performed by: Margarita Mail, PA-C Authorized by: Margarita Mail, PA-C   Critical care provider statement:    Critical care time (minutes):  30   Critical care  time was exclusive of:  Separately billable procedures and treating other patients   Critical care was necessary to treat or prevent imminent or life-threatening deterioration of the following conditions:  Circulatory failure   Critical care was time spent personally by me on the following activities:  Development of treatment plan with patient or surrogate, discussions with consultants, evaluation of patient's response to treatment, examination of patient, ordering and review of laboratory studies, ordering and review of radiographic studies, ordering and performing treatments and interventions, pulse oximetry, re-evaluation of patient's condition and review of old charts   Care discussed with: admitting provider       Medications Ordered in ED Medications  pantoprazole (PROTONIX) injection 40 mg (has no administration in time range)    ED Course/ Medical Decision Making/ A&P Clinical Course as of 06/23/21 0038  Wed Jun 22, 2021  2337 CBC with Differential(!!) [AH]  2337 Hemoglobin(!!): 6.2 Hemoglobin noted to be 6.2, he has a history of microcytic anemia and the lowest it has been in the past has been 8.4.  I have ordered 2 units of blood for transfusion.  Patient placed on bolus of Protonix [AH]  2338 EKG 12-Lead EKG shows sinus rhythm at a rate of 85 [AH]    Clinical Course User Index [AH] Margarita Mail, PA-C                           Medical Decision Making 61 year old male who presents emergency department with GI bleed.  Patient had melanotic stool for the past couple days, he had a large bowel movement here that was both dark and bright red.  He also had multiple episodes of vomiting bright red blood.  No active vomiting here.  Patient received 2 units of blood here but has had no further bleeding and has remained stable here in the emergency department, normotensive and without tachycardia.  I think he is stable for stepdown unit and I have notified GI of need for evaluation of  the patient first thing in the morning.  Case discussed with Dr. Irene Pap who will admit the patient for the hospitalist service  Problems Addressed: Gastrointestinal hemorrhage, unspecified gastrointestinal hemorrhage type: complicated acute illness or injury that poses a threat to life or bodily functions  Amount and/or Complexity of Data Reviewed Independent Historian:     Details: adult children at bedside Labs: ordered. Decision-making details documented in ED Course.  Details: Labs reviewed.  CMP shows elevated blood glucose, mildly elevated BUN likely secondary to patient's active GI bleed.  CBC with hemoglobin of 6.2 below baseline which is usually around 8 or 9.  Fecal occult stool positive Radiology: ordered. ECG/medicine tests: ordered and independent interpretation performed. Decision-making details documented in ED Course.  Risk Prescription drug management. Decision regarding hospitalization.     { Final Clinical Impression(s) / ED Diagnoses Final diagnoses:  None    Rx / DC Orders ED Discharge Orders     None         Margarita Mail, PA-C 06/24/21 0232    Ripley Fraise, MD 06/24/21 [phone]

## 2021-06-22 NOTE — ED Triage Notes (Signed)
Pt states he began vomiting large amounts of dark blood today. EMS estimates around 300cc that they seen. Pt states he also started having dark stool about 2 days ago. No history of gi bleeds. Pt takes ASA.

## 2021-06-22 NOTE — ED Notes (Signed)
Pt had large loose bloody stool.

## 2021-06-23 ENCOUNTER — Inpatient Hospital Stay (HOSPITAL_COMMUNITY): Payer: Medicaid Other

## 2021-06-23 ENCOUNTER — Inpatient Hospital Stay (HOSPITAL_COMMUNITY): Payer: Medicaid Other | Admitting: Anesthesiology

## 2021-06-23 ENCOUNTER — Encounter (HOSPITAL_COMMUNITY)
Admission: EM | Disposition: A | Discharge status: Home / Self Care | Payer: Self-pay | Source: Home / Self Care | Attending: Internal Medicine

## 2021-06-23 ENCOUNTER — Encounter (HOSPITAL_COMMUNITY): Payer: Self-pay | Admitting: Internal Medicine

## 2021-06-23 DIAGNOSIS — E785 Hyperlipidemia, unspecified: Secondary | ICD-10-CM | POA: Diagnosis present

## 2021-06-23 DIAGNOSIS — I2510 Atherosclerotic heart disease of native coronary artery without angina pectoris: Secondary | ICD-10-CM

## 2021-06-23 DIAGNOSIS — K920 Hematemesis: Secondary | ICD-10-CM | POA: Diagnosis present

## 2021-06-23 DIAGNOSIS — Z9861 Coronary angioplasty status: Secondary | ICD-10-CM

## 2021-06-23 DIAGNOSIS — Z7982 Long term (current) use of aspirin: Secondary | ICD-10-CM | POA: Diagnosis not present

## 2021-06-23 DIAGNOSIS — K2970 Gastritis, unspecified, without bleeding: Secondary | ICD-10-CM

## 2021-06-23 DIAGNOSIS — F17210 Nicotine dependence, cigarettes, uncomplicated: Secondary | ICD-10-CM | POA: Diagnosis present

## 2021-06-23 DIAGNOSIS — Z9079 Acquired absence of other genital organ(s): Secondary | ICD-10-CM | POA: Diagnosis not present

## 2021-06-23 DIAGNOSIS — R9431 Abnormal electrocardiogram [ECG] [EKG]: Secondary | ICD-10-CM

## 2021-06-23 DIAGNOSIS — Z9049 Acquired absence of other specified parts of digestive tract: Secondary | ICD-10-CM | POA: Diagnosis not present

## 2021-06-23 DIAGNOSIS — I5042 Chronic combined systolic (congestive) and diastolic (congestive) heart failure: Secondary | ICD-10-CM | POA: Diagnosis present

## 2021-06-23 DIAGNOSIS — Z87820 Personal history of traumatic brain injury: Secondary | ICD-10-CM | POA: Diagnosis not present

## 2021-06-23 DIAGNOSIS — K922 Gastrointestinal hemorrhage, unspecified: Secondary | ICD-10-CM | POA: Diagnosis not present

## 2021-06-23 DIAGNOSIS — Z91128 Patient's intentional underdosing of medication regimen for other reason: Secondary | ICD-10-CM | POA: Diagnosis not present

## 2021-06-23 DIAGNOSIS — K2100 Gastro-esophageal reflux disease with esophagitis, without bleeding: Secondary | ICD-10-CM

## 2021-06-23 DIAGNOSIS — Z923 Personal history of irradiation: Secondary | ICD-10-CM | POA: Diagnosis not present

## 2021-06-23 DIAGNOSIS — F32A Depression, unspecified: Secondary | ICD-10-CM | POA: Diagnosis present

## 2021-06-23 DIAGNOSIS — I110 Hypertensive heart disease with heart failure: Secondary | ICD-10-CM | POA: Diagnosis present

## 2021-06-23 DIAGNOSIS — E872 Acidosis, unspecified: Secondary | ICD-10-CM | POA: Diagnosis present

## 2021-06-23 DIAGNOSIS — Z7902 Long term (current) use of antithrombotics/antiplatelets: Secondary | ICD-10-CM | POA: Diagnosis not present

## 2021-06-23 DIAGNOSIS — K254 Chronic or unspecified gastric ulcer with hemorrhage: Secondary | ICD-10-CM

## 2021-06-23 DIAGNOSIS — D62 Acute posthemorrhagic anemia: Secondary | ICD-10-CM

## 2021-06-23 DIAGNOSIS — K2971 Gastritis, unspecified, with bleeding: Secondary | ICD-10-CM | POA: Diagnosis present

## 2021-06-23 DIAGNOSIS — M1990 Unspecified osteoarthritis, unspecified site: Secondary | ICD-10-CM | POA: Diagnosis present

## 2021-06-23 DIAGNOSIS — Z96651 Presence of right artificial knee joint: Secondary | ICD-10-CM | POA: Diagnosis present

## 2021-06-23 DIAGNOSIS — Z79899 Other long term (current) drug therapy: Secondary | ICD-10-CM | POA: Diagnosis not present

## 2021-06-23 DIAGNOSIS — Z8546 Personal history of malignant neoplasm of prostate: Secondary | ICD-10-CM | POA: Diagnosis not present

## 2021-06-23 DIAGNOSIS — K250 Acute gastric ulcer with hemorrhage: Secondary | ICD-10-CM | POA: Diagnosis not present

## 2021-06-23 DIAGNOSIS — Z955 Presence of coronary angioplasty implant and graft: Secondary | ICD-10-CM | POA: Diagnosis not present

## 2021-06-23 DIAGNOSIS — I252 Old myocardial infarction: Secondary | ICD-10-CM | POA: Diagnosis not present

## 2021-06-23 DIAGNOSIS — I10 Essential (primary) hypertension: Secondary | ICD-10-CM

## 2021-06-23 DIAGNOSIS — Z8674 Personal history of sudden cardiac arrest: Secondary | ICD-10-CM | POA: Diagnosis not present

## 2021-06-23 HISTORY — PX: ESOPHAGOGASTRODUODENOSCOPY (EGD) WITH PROPOFOL: SHX5813

## 2021-06-23 HISTORY — PX: BIOPSY: SHX5522

## 2021-06-23 LAB — COMPREHENSIVE METABOLIC PANEL
ALT: 14 U/L (ref 0–44)
AST: 13 U/L — ABNORMAL LOW (ref 15–41)
Albumin: 3.2 g/dL — ABNORMAL LOW (ref 3.5–5.0)
Alkaline Phosphatase: 26 U/L — ABNORMAL LOW (ref 38–126)
Anion gap: 9 (ref 5–15)
BUN: 18 mg/dL (ref 8–23)
CO2: 22 mmol/L (ref 22–32)
Calcium: 8.7 mg/dL — ABNORMAL LOW (ref 8.9–10.3)
Chloride: 109 mmol/L (ref 98–111)
Creatinine, Ser: 0.87 mg/dL (ref 0.61–1.24)
GFR, Estimated: >60 mL/min (ref >60)
Glucose, Bld: 111 mg/dL — ABNORMAL HIGH (ref 70–99)
Potassium: 4 mmol/L (ref 3.5–5.1)
Sodium: 140 mmol/L (ref 135–145)
Total Bilirubin: 2.3 mg/dL — ABNORMAL HIGH (ref 0.3–1.2)
Total Protein: 5.4 g/dL — ABNORMAL LOW (ref 6.5–8.1)

## 2021-06-23 LAB — CBC
HCT: 24.8 % — ABNORMAL LOW (ref 39.0–52.0)
Hemoglobin: 8.1 g/dL — ABNORMAL LOW (ref 13.0–17.0)
MCH: 25.8 pg — ABNORMAL LOW (ref 26.0–34.0)
MCHC: 32.7 g/dL (ref 30.0–36.0)
MCV: 79 fL — ABNORMAL LOW (ref 80.0–100.0)
Platelets: 211 10*3/uL (ref 150–400)
RBC: 3.14 MIL/uL — ABNORMAL LOW (ref 4.22–5.81)
RDW: 16.6 % — ABNORMAL HIGH (ref 11.5–15.5)
WBC: 7.9 10*3/uL (ref 4.0–10.5)
nRBC: 0 % (ref 0.0–0.2)

## 2021-06-23 LAB — CBC WITH DIFFERENTIAL/PLATELET
Abs Immature Granulocytes: 0.05 10*3/uL (ref 0.00–0.07)
Basophils Absolute: 0 10*3/uL (ref 0.0–0.1)
Basophils Relative: 0 %
Eosinophils Absolute: 0 10*3/uL (ref 0.0–0.5)
Eosinophils Relative: 0 %
HCT: 28 % — ABNORMAL LOW (ref 39.0–52.0)
Hemoglobin: 9.1 g/dL — ABNORMAL LOW (ref 13.0–17.0)
Immature Granulocytes: 1 %
Lymphocytes Relative: 9 %
Lymphs Abs: 0.8 10*3/uL (ref 0.7–4.0)
MCH: 25.8 pg — ABNORMAL LOW (ref 26.0–34.0)
MCHC: 32.5 g/dL (ref 30.0–36.0)
MCV: 79.3 fL — ABNORMAL LOW (ref 80.0–100.0)
Monocytes Absolute: 0.7 10*3/uL (ref 0.1–1.0)
Monocytes Relative: 8 %
Neutro Abs: 8 10*3/uL — ABNORMAL HIGH (ref 1.7–7.7)
Neutrophils Relative %: 82 %
Platelets: 219 10*3/uL (ref 150–400)
RBC: 3.53 MIL/uL — ABNORMAL LOW (ref 4.22–5.81)
RDW: 16.1 % — ABNORMAL HIGH (ref 11.5–15.5)
WBC: 9.6 10*3/uL (ref 4.0–10.5)
nRBC: 0 % (ref 0.0–0.2)

## 2021-06-23 LAB — MAGNESIUM: Magnesium: 2 mg/dL (ref 1.7–2.4)

## 2021-06-23 LAB — ECHOCARDIOGRAM COMPLETE
Area-P 1/2: 2.05 cm2
Calc EF: 47.8 %
Height: 71 in
S' Lateral: 4.1 cm
Single Plane A2C EF: 49.3 %
Single Plane A4C EF: 44 %
Weight: 2976 oz

## 2021-06-23 LAB — HEMOGLOBIN AND HEMATOCRIT, BLOOD
HCT: 26.4 % — ABNORMAL LOW (ref 39.0–52.0)
Hemoglobin: 8.5 g/dL — ABNORMAL LOW (ref 13.0–17.0)

## 2021-06-23 LAB — PHOSPHORUS: Phosphorus: 4.2 mg/dL (ref 2.5–4.6)

## 2021-06-23 SURGERY — ESOPHAGOGASTRODUODENOSCOPY (EGD) WITH PROPOFOL
Anesthesia: Monitor Anesthesia Care

## 2021-06-23 MED ORDER — PROPOFOL 500 MG/50ML IV EMUL
INTRAVENOUS | Status: DC | PRN
  Administered 2021-06-23: 150 ug/kg/min via INTRAVENOUS

## 2021-06-23 MED ORDER — PANTOPRAZOLE SODIUM 40 MG IV SOLR: 40.0000 mg | Freq: Two times a day (BID) | INTRAVENOUS | Status: DC

## 2021-06-23 MED ORDER — ACETAMINOPHEN 325 MG PO TABS: 650.0000 mg | ORAL_TABLET | Freq: Four times a day (QID) | ORAL | Status: DC | PRN

## 2021-06-23 MED ORDER — PANTOPRAZOLE 80MG IVPB - SIMPLE MED
80.0000 mg | Freq: Once | INTRAVENOUS | Status: AC
  Administered 2021-06-23: 80 mg via INTRAVENOUS
  Filled 2021-06-23: qty 100

## 2021-06-23 MED ORDER — PROCHLORPERAZINE EDISYLATE 10 MG/2ML IJ SOLN
10.0000 mg | Freq: Four times a day (QID) | INTRAMUSCULAR | Status: DC | PRN
Start: 2021-06-23 — End: 2021-06-26

## 2021-06-23 MED ORDER — GLYCOPYRROLATE PF 0.2 MG/ML IJ SOSY
PREFILLED_SYRINGE | INTRAMUSCULAR | Status: DC | PRN
  Administered 2021-06-23: .1 mg via INTRAVENOUS

## 2021-06-23 MED ORDER — PANTOPRAZOLE INFUSION (NEW) - SIMPLE MED
8.0000 mg/h | INTRAVENOUS | Status: DC
  Administered 2021-06-23 – 2021-06-25 (×5): 8 mg/h via INTRAVENOUS
  Filled 2021-06-23: qty 100
  Filled 2021-06-23: qty 80
  Filled 2021-06-23: qty 100
  Filled 2021-06-23 (×2): qty 80
  Filled 2021-06-23 (×2): qty 100

## 2021-06-23 MED ORDER — MELATONIN 5 MG PO TABS
5.0000 mg | ORAL_TABLET | Freq: Every evening | ORAL | Status: DC | PRN
Start: 2021-06-23 — End: 2021-06-26

## 2021-06-23 MED ORDER — PHENYLEPHRINE 80 MCG/ML (10ML) SYRINGE FOR IV PUSH (FOR BLOOD PRESSURE SUPPORT)
PREFILLED_SYRINGE | INTRAVENOUS | Status: DC | PRN
  Administered 2021-06-23: 80 ug via INTRAVENOUS

## 2021-06-23 MED ORDER — ATORVASTATIN CALCIUM 80 MG PO TABS
80.0000 mg | ORAL_TABLET | Freq: Every day | ORAL | Status: DC
  Administered 2021-06-23 – 2021-06-26 (×4): 80 mg via ORAL
  Filled 2021-06-23: qty 2
  Filled 2021-06-23 (×3): qty 1

## 2021-06-23 MED ORDER — LACTATED RINGERS IV SOLN
INTRAVENOUS | Status: DC
Start: 2021-06-23 — End: 2021-06-24

## 2021-06-23 MED ORDER — PROPOFOL 10 MG/ML IV BOLUS
INTRAVENOUS | Status: DC | PRN
  Administered 2021-06-23: 30 mg via INTRAVENOUS
  Administered 2021-06-23: 20 mg via INTRAVENOUS
  Administered 2021-06-23: 50 mg via INTRAVENOUS

## 2021-06-23 MED ORDER — IOHEXOL 350 MG/ML SOLN
100.0000 mL | Freq: Once | INTRAVENOUS | Status: AC | PRN
  Administered 2021-06-23: 100 mL via INTRAVENOUS

## 2021-06-23 MED ORDER — POLYETHYLENE GLYCOL 3350 17 G PO PACK: 17.0000 g | PACK | Freq: Every day | ORAL | Status: DC | PRN

## 2021-06-23 SURGICAL SUPPLY — 15 items

## 2021-06-23 NOTE — Interval H&P Note (Signed)
History and Physical Interval Note:  06/23/2021 1:41 PM  Raymond [name]  has presented today for surgery, with the diagnosis of GI bleed, hematemesis.  The various methods of treatment have been discussed with the patient and family. After consideration of risks, benefits and other options for treatment, the patient has consented to  Procedure(s): ESOPHAGOGASTRODUODENOSCOPY (EGD) WITH PROPOFOL (N/A) as a surgical intervention.  The patient's history has been reviewed, patient examined, no change in status, stable for surgery.  I have reviewed the patient's chart and labs.  Questions were answered to the patient's satisfaction.     Silvano Rusk

## 2021-06-23 NOTE — H&P (Addendum)
History and Physical  Raymond [name] YJE:[id number] DOB: 1960-07-26 DOA: 06/22/2021  Referring physician: Spero Curb  PCP: Gevena Mart, DO  Outpatient Specialists: Cardiology Patient coming from: Home via EMS.  Chief Complaint: GI bleed  HPI: Raymond [name] is a 61 y.o. male with medical history significant for coronary artery disease on Aspirin and Brilinta, status post NSTEMI in 2021 after out of hospital cardiac arrest requiring CPR, cardiology recommended DAPT for 1 year, however, he was still taking Brilinta and aspirin.  History of prostate cancer, stage I adenocarcinoma, essential hypertension, hyperlipidemia, tobacco use disorder, who presented to [facility] ED from home via EMS due to intermittent lower GI bleed x2 days and 1 episode of bloody emesis on the day of his presentation.  Associated with generalized fatigue.  Denies any significant abdominal pain.  EMS was activated and witnessed about 300 cc of bloody emesis.  The patient was brought into the ED for further evaluation.    While in the ED, the patient had a large bloody stools.  Work-up in the ED reveals symptomatic acute blood loss anemia with hemoglobin of 6.2 K for which he received 2 units PRBCs transfusions.  Vital signs are stable, not hypotensive.  2 peripheral IV accesses are in place.  The patient endorses the last dose of aspirin and Brilinta was 3 days ago.  He stopped taking all his medications because he thought they were giving him chest pain.  No chest pain for the past 2 days.  Admits to drinking beer every day and smoking 3 to 4 cigarettes/day.  Denies use of NSAIDs.  The patient received 1 dose of IV Protonix 40 mg in the ED.  Blood transfusion was initiated 2 units PRBCs.  TRH, hospitalist service, was asked to admit.  ED Course: Tmax 98.6.  BP 137/80, pulse 81, respiration rate 18, O2 saturation 100% on room air.  Lab studies significant for serum bicarb 21, glucose 169, BUN 24, creatinine 1.24, calcium 8.7,  alkaline phosphatase 24, total protein 5.0.  Hemoglobin 6.2 baseline 10.  MCV 79, platelet count 244.  Review of Systems: Review of systems as noted in the HPI. All other systems reviewed and are negative.   Past Medical History:  Diagnosis Date   Arthritis    At risk for sleep apnea    STOP-BANG= 4    SENT TO PCP 07-09-2014   BPH (benign prostatic hypertrophy)    Cardiac arrest ([place]) 06/14/2019   Dental caries    periodontitis   Depression    Elevated PSA    History of concussion    yrs ago-- no LOC-- no residual   History of depression    Hypertension    Nocturia    Prostate cancer ([place])    Wears glasses    Past Surgical History:  Procedure Laterality Date   APPENDECTOMY  age 66   CORONARY STENT INTERVENTION N/A 06/14/2019   Procedure: CORONARY STENT INTERVENTION;  Surgeon: [name], MD;  Location: [place] [facility];  Service: Cardiovascular;  Laterality: N/A;   CORONARY/GRAFT ACUTE MI REVASCULARIZATION N/A 06/14/2019   Procedure: Coronary/Graft Acute MI Revascularization;  Surgeon: [name], MD;  Location: [place];  Service: Cardiovascular;  Laterality: N/A;   CYSTOSCOPY WITH URETHRAL DILATATION N/A 06/15/2019   Procedure: CYSTOSCOPY WITH BALLOON URETHRAL DILATATION;  Surgeon: [name], MD;  Location: [place] [facility];  Service: Urology;  Laterality: N/A;   LEFT HEART CATH AND CORONARY ANGIOGRAPHY N/A 06/14/2019  Procedure: LEFT HEART CATH AND CORONARY ANGIOGRAPHY;  Surgeon: [name], MD;  Location: [place] [facility];  Service: Cardiovascular;  Laterality: N/A;   MULTIPLE EXTRACTIONS WITH ALVEOLOPLASTY N/A 10/12/2017   Procedure: MULTIPLE EXTRACTION WITH ALVEOLOPLASTY;  Surgeon: [name], DDS;  Location: [facility];  Service: Oral Surgery;  Laterality: N/A;   PELVIC LYMPH NODE DISSECTION Bilateral 11/04/2018   Procedure: PELVIC LYMPH NODE DISSECTION;  Surgeon: [name], MD;  Location: [facility];  Service: Urology;  Laterality: Bilateral;    PROSTATE BIOPSY N/A 07/15/2014   Procedure: SATURATION BIOPSY TRANSRECTAL ULTRASONIC PROSTATE (TUBP);  Surgeon: [name], MD;  Location[facility];  Service: Urology;  Laterality: N/A;   PROSTATE BIOPSY N/A 08/07/2018   Procedure: BIOPSY TRANSRECTAL ULTRASONIC PROSTATE (TUBP);  Surgeon: [name], MD;  Location: [facility];  Service: Urology;  Laterality: N/A;  ONLY NEEDS 30 MIN   RIGHT KNEE ARTHROSCOPY /  MENISECTOMY/  DEBRIDEMENT OF CHONDROMALACIA PATELLA AND MEDIAL SHELF PLICA/  SUBCHONDROPLASTY  06-10-2010   ROBOT ASSISTED LAPAROSCOPIC RADICAL PROSTATECTOMY N/A 11/04/2018   Procedure: XI ROBOTIC ASSISTED LAPAROSCOPIC RADICAL PROSTATECTOMY;  Surgeon: [name], MD;  Location: [facility];  Service: Urology;  Laterality: N/A;   TOTAL KNEE ARTHROPLASTY  05/31/2011   Procedure: TOTAL KNEE ARTHROPLASTY;  Surgeon: [name], MD;  Location: [place];  Service: Orthopedics;  Laterality: Right;  with revision stem    Social History:  reports that he quit smoking about 2 years ago. His smoking use included cigarettes. He has a 2.00 pack-year smoking history. He has never used smokeless tobacco. He reports current alcohol use. He reports that he does not use drugs.   No Known Allergies  Family History  Problem Relation Age of Onset   Prostate cancer Neg Hx    Colon cancer Neg Hx    Breast cancer Neg Hx       Prior to Admission medications   Medication Sig Start Date End Date Taking? Authorizing Provider  aspirin 81 MG chewable tablet Chew 1 tablet (81 mg total) by mouth daily. 06/21/19   Mercy Riding, MD  atorvastatin (LIPITOR) 80 MG tablet Take 1 tablet (80 mg total) by mouth daily. 06/21/19   Mercy Riding, MD  gemfibrozil (LOPID) 600 MG tablet Take 600 mg by mouth 2 (two) times daily. 09/26/17   [provider]  lisinopril (ZESTRIL) 10 MG tablet Take 1 tablet (10 mg total) by mouth daily. 06/20/19 12/17/19  Mercy Riding, MD   metoprolol tartrate (LOPRESSOR) 25 MG tablet Take 12.5 mg by mouth 2 (two) times daily.    [provider]  potassium chloride (KLOR-CON) 10 MEQ tablet TAKE 1 TABLET(10 MEQ) BY MOUTH 1 TIME FOR 1 DOSE 12/08/20   Sherren Mocha, MD  sildenafil (REVATIO) 20 MG tablet Take 20 mg by mouth daily as needed (ED).    [provider]  ticagrelor (BRILINTA) 90 MG TABS tablet Take 1 tablet (90 mg total) by mouth 2 (two) times daily. 06/20/19   Charlie Pitter, PA-C    Physical Exam: BP 137/80   Pulse 86   Temp 98.6 F (37 C) (Oral)   Resp 20   Ht '5\' 11"'$  (1.803 m)   Wt 84.4 kg   SpO2 100%   BMI 25.94 kg/m   General: 61 y.o. year-old male well developed well nourished in no acute distress.  Alert and oriented x3. Cardiovascular: Regular rate and rhythm with no  rubs or gallops.  No thyromegaly or JVD noted.  No lower extremity edema. 2/4 pulses in all 4 extremities. Respiratory: Clear to auscultation with no wheezes or rales. Good inspiratory effort. Abdomen: Soft. Mild tenderness with moderate palpation of left lower quadrant abdomen.  Non-distended with normal bowel sounds x4 quadrants. Muskuloskeletal: No cyanosis, clubbing or edema noted bilaterally Neuro: CN II-XII intact, strength, sensation, reflexes Skin: No ulcerative lesions noted or rashes Psychiatry: Judgement and insight appear normal. Mood is appropriate for condition and setting          Labs on Admission:  Basic Metabolic Panel: Recent Labs  Lab 06/22/21 2251  NA 143  K 3.6  CL 113*  CO2 21*  GLUCOSE 169*  BUN 24*  CREATININE 1.24  CALCIUM 8.7*   Liver Function Tests: Recent Labs  Lab 06/22/21 2251  AST 19  ALT 15  ALKPHOS 24*  BILITOT 0.6  PROT 5.0*  ALBUMIN 2.9*   Recent Labs  Lab 06/22/21 2251  LIPASE 33   No results for input(s): "AMMONIA" in the last 168 hours. CBC: Recent Labs  Lab 06/22/21 2251  WBC 8.1  NEUTROABS 6.2  HGB 6.2*  HCT 20.7*  MCV 79.9*  PLT 244   Cardiac  Enzymes: No results for input(s): "CKTOTAL", "CKMB", "CKMBINDEX", "TROPONINI" in the last 168 hours.  BNP (last 3 results) No results for input(s): "BNP" in the last 8760 hours.  ProBNP (last 3 results) No results for input(s): "PROBNP" in the last 8760 hours.  CBG: No results for input(s): "GLUCAP" in the last 168 hours.  Radiological Exams on Admission: No results found.  EKG: I independently viewed the EKG done and my findings are as followed: Sinus rhythm rate of 85, T wave inversion, V4 V5 and V6.  Assessment/Plan Present on Admission:  GI bleeding  Principal Problem:   GI bleeding  GI bleed, unclear etiology. For the past 2 days he has had lower GI bleed On the day of presentation he had bloody emesis. Denies use of NSAIDs Frequent use of alcohol and tobacco. CTA GI bleed pending GI consulted by EDP Serial H&H every 6 hours Start Protonix drip due to hematemesis Transfuse hemoglobin less than 8.0 K due to underlying cardiac history  Hematemesis Hold off home DAPT Last dose of DAPT was 3 days ago Follow CTA GI bleed Maintain 2 peripheral IV accesses Transfuse blood as indicated Start Protonix drip Closely monitor vital signs Maintain MAP greater than 65 Will benefit from EGD N.p.o. except sips and meds until seen by GI.  Hematochezia/melena For the past 2 days. GI consulted by EDP Management per GI  Nonexertional chest pain Endorses nonexertional chest pain 2 days ago that have now resolved Abnormal twelve-lead EKG, serial twelve-lead EKG, repeat in 3 hours. Obtain 2D echo Consult cardiology  Normal anion gap metabolic acidosis Serum bicarb 21, anion gap of 9 Gentle IV fluid hydration LR 50 cc/h x 1 day. Repeat chemistry panel in the morning.  HFpEF Last 2D echo done on 09/26/2019 showed LVEF 55 to 60% Follow repeat 2D echo Start strict I's and O's and daily weight  Hypertension Hold off home oral antihypertensives due to risks of hypotension  in the setting of ongoing GI bleed Closely monitor vital signs  History of cardiac arrest/NSTEMI/coronary artery disease Hold off DAPT for now Consult cardiology in the morning for advice on antiplatelets in the setting of ongoing GI bleed.  Hyperlipidemia Hold off home gemfibrozil for now Restart home Lipitor.  Critical care time: 65 minutes    DVT prophylaxis: SCDs  Code Status: Full code  Family Communication: Daughters is at bedside  Disposition Plan: Admitted to progressive unit  Consults called: GI consulted by EDP.  Please consult cardiology in the morning for recommendations.  Admission status: Inpatient status.   Status is: Inpatient The patient requires at least 2 midnights for further evaluation and treatment of present condition.   Kayleen Memos MD [facility] Pager [phone]  If 7PM-7AM, please contact night-coverage [email or web address] Password [email or web address]  06/23/2021, 2:01 AM

## 2021-06-23 NOTE — Transfer of Care (Signed)
Immediate Anesthesia Transfer of Care Note  Patient: Raymond [name]  Procedure(s) Performed: ESOPHAGOGASTRODUODENOSCOPY (EGD) WITH PROPOFOL BIOPSY  Patient Location: Short Stay  Anesthesia Type:MAC  Level of Consciousness: drowsy  Airway & Oxygen Therapy: Patient Spontanous Breathing  Post-op Assessment: Report given to RN and Post -op Vital signs reviewed and stable  Post vital signs: Reviewed and stable  Last Vitals:  Vitals Value Taken Time  BP 97/44 06/23/21 1407  Temp 37 C 06/23/21 1407  Pulse 83 06/23/21 1407  Resp 16 06/23/21 1407  SpO2 97 % 06/23/21 1407    Last Pain:  Vitals:   06/23/21 1407  TempSrc: Axillary  PainSc:          Complications: No notable events documented.

## 2021-06-23 NOTE — Anesthesia Procedure Notes (Signed)
Procedure Name: General with mask airway Date/Time: 06/23/2021 1:50 PM  Performed by: Erick Colace, CRNAPre-anesthesia Checklist: Patient identified, Emergency Drugs available, Suction available and Patient being monitored Patient Re-evaluated:Patient Re-evaluated prior to induction Oxygen Delivery Method: Nasal cannula Preoxygenation: Pre-oxygenation with 100% oxygen Induction Type: IV induction Airway Equipment and Method: Bite block Dental Injury: Teeth and Oropharynx as per pre-operative assessment

## 2021-06-23 NOTE — Op Note (Signed)
[facility] Patient Name: Raymond [name] Procedure Date : 06/23/2021 MRN: [id number] Attending MD: [name] , MD Date of Birth: 09/06/1960 CSN: [id number] Age: 61 Admit Type: Inpatient Procedure:                Upper GI endoscopy Indications:              Hematemesis Providers:                [name], MD, Ervin Knack, RN, Frazier Richards, Technician Referring MD:              Medicines:                Monitored Anesthesia Care Complications:            No immediate complications. Estimated Blood Loss:     Estimated blood loss was minimal. Procedure:                Pre-Anesthesia Assessment:                           - Prior to the procedure, a History and Physical                            was performed, and patient medications and                            allergies were reviewed. The patient's tolerance of                            previous anesthesia was also reviewed. The risks                            and benefits of the procedure and the sedation                            options and risks were discussed with the patient.                            All questions were answered, and informed consent                            was obtained. Prior Anticoagulants: The patient                            last took antiplatelet medication 3 days prior to                            the procedure. ASA Grade Assessment: III - A                            patient with severe systemic disease. After  reviewing the risks and benefits, the patient was                            deemed in satisfactory condition to undergo the                            procedure.                           After obtaining informed consent, the endoscope was                            passed under direct vision. Throughout the                            procedure, the patient's blood pressure, pulse, and                             oxygen saturations were monitored continuously. The                            GIF-H190 ([id number] Olympus endoscope was introduced                            through the mouth, and advanced to the second part                            of duodenum. The upper GI endoscopy was                            accomplished without difficulty. The patient                            tolerated the procedure well. Scope In: Scope Out: Findings:      One non-bleeding cratered gastric ulcer with adherent clot was found on       the lesser curvature of the stomach. The lesion was 20 mm in largest       dimension.      Localized mild inflammation characterized by erosions and erythema was       found in the prepyloric region of the stomach. Biopsies were taken with       a cold forceps for histology. Estimated blood loss was minimal.      LA Grade A (one or more mucosal breaks less than 5 mm, not extending       between tops of 2 mucosal folds) esophagitis with no bleeding was found       in the distal esophagus.      The exam was otherwise without abnormality.      The cardia and gastric fundus were normal on retroflexion. Impression:               - Non-bleeding gastric ulcer with adherent clot                            that is source of hemorrhage. Large deep ulcer with  adherent clot. Given recent Brilinta, size and                            shape I decided not to intervene with clips or                            thermal therapy. High risk more bleeding.                           - Gastritis. Biopsied. R/O H pylori                           - LA Grade A reflux esophagitis with no bleeding.                            Single erosion/ulcer                           - The examination was otherwise normal. Recommendation:           - Return patient to hospital ward for ongoing care.                            He should be in stepdown or progressive care. If he                             shows signs of more bleeding would give platelets                            to overcome Brilinta effects and we could rescope                            and consider hemospray or other therapy vs IR and                            embolization. Surgical consultation could be needed                            also.                           - NPO and continuous PPI infusion                           - f/u H pylori testing                           - rescope at some point to document healing (2 +                            months) but this will also depend upon clinical                            course                           -  serial Hgb                           - will follow                           - needs cardiology consult re: follow-up and                            longer-term treatment plans though can wait on that                            right now Procedure Code(s):        --- Professional ---                           [phone], Esophagogastroduodenoscopy, flexible,                            transoral; with biopsy, single or multiple Diagnosis Code(s):        --- Professional ---                           K25.4, Chronic or unspecified gastric ulcer with                            hemorrhage                           K29.70, Gastritis, unspecified, without bleeding                           K21.00, Gastro-esophageal reflux disease with                            esophagitis, without bleeding                           K92.0, Hematemesis CPT copyright 2019 American Medical Association. All rights reserved. The codes documented in this report are preliminary and upon coder review may  be revised to meet current compliance requirements. [name], MD 06/23/2021 2:23:32 PM This report has been signed electronically. Number of Addenda: 0

## 2021-06-23 NOTE — Anesthesia Postprocedure Evaluation (Signed)
Anesthesia Post Note  Patient: Raymond [name]  Procedure(s) Performed: ESOPHAGOGASTRODUODENOSCOPY (EGD) WITH PROPOFOL BIOPSY     Patient location during evaluation: PACU Anesthesia Type: MAC Level of consciousness: awake and alert Pain management: pain level controlled Vital Signs Assessment: post-procedure vital signs reviewed and stable Respiratory status: spontaneous breathing, nonlabored ventilation and respiratory function stable Cardiovascular status: stable and blood pressure returned to baseline Postop Assessment: no apparent nausea or vomiting Anesthetic complications: no   No notable events documented.  Last Vitals:  Vitals:   06/23/21 1420 06/23/21 1427  BP: 125/74 138/76  Pulse: 70 87  Resp: 11 20  Temp:    SpO2: 100% 99%    Last Pain:  Vitals:   06/23/21 1407  TempSrc: Axillary  PainSc:                  Tearia Gibbs,W. EDMOND

## 2021-06-23 NOTE — Consult Note (Signed)
Consultation  Referring Provider:  [facility]/ [place] Crane DO Primary Care Physician:  Gevena Mart, DO Primary Gastroenterologist: none unassigned  Reason for Consultation:   acute GI bleed  HPI: Raymond [name] is a 61 y.o. male, who presented to the emergency room late last evening after an episode of bloody emesis.  Patient related that he had been seeing black stools over the previous couple of days, and had been nauseated at home with some dry heaves.  He also relates some mild epigastric discomfort.  By his report his stool was very dark but had some tinge of red.  He was brought in by EMS, and on arrival to the ER had 300 cc of dark bloody emesis.  He also had another episode of large bloody stool. Patient has been on aspirin and Brilinta, with history of MI 2021, hypertension, prostate cancer status post surgery and chemotherapy. He says he was feeling bad over this past weekend and had been having some intermittent chest pain, so stopped taking all of his medicines on Sunday including the aspirin and Brilinta. He has not had any prior history of GI bleeding, no prior GI work-up.  Relates that he did do a Cologuard per his PCP earlier this year which was negative. He has not been using any NSAIDs, Does drink alcohol on a frequent basis.  He relates drinking a lot of alcohol over Memorial Day weekend but most days just drinks a couple of beers. Labs on admission WBC 8.1/hemoglobin 6.2/hematocrit 20.7/MCV 79/platelets 244 INR 1.1 BUN 20/creatinine 1.24 LFTs within normal limits  CT angio was done-no focal liver abnormalities, no gallstones no extravasated contrast or evidence of active GI bleeding, there is a focal area of inflammatory changes and thickening of the wall of the stomach, lesser curve, and an apparent focal area of discontinuity of the wall of the stomach possibly ulcer-there is a punctate focus of air along the wall of the lesser curve of the stomach, question air and a gastric ulcer  or possibly focally contained microperforation, scattered diverticulosis  He has been hemodynamically stable, has been transfused 2 units of packed RBCs and this morning hemoglobin 9.1/hematocrit 28 Says he feels a lot better since the transfusions, no further vomiting or hematemesis.  Denies any chest pain or shortness of breath   Past Medical History:  Diagnosis Date   Arthritis    At risk for sleep apnea    STOP-BANG= 4    SENT TO PCP 07-09-2014   BPH (benign prostatic hypertrophy)    Cardiac arrest ([place]) 06/14/2019   Dental caries    periodontitis   Depression    Elevated PSA    History of concussion    yrs ago-- no LOC-- no residual   History of depression    Hypertension    Nocturia    Prostate cancer ([place])    Wears glasses     Past Surgical History:  Procedure Laterality Date   APPENDECTOMY  age 21   CORONARY STENT INTERVENTION N/A 06/14/2019   Procedure: CORONARY STENT INTERVENTION;  Surgeon: [name], MD;  Location: [facility];  Service: Cardiovascular;  Laterality: N/A;   CORONARY/GRAFT ACUTE MI REVASCULARIZATION N/A 06/14/2019   Procedure: Coronary/Graft Acute MI Revascularization;  Surgeon: [name], MD;  Location: [facility];  Service: Cardiovascular;  Laterality: N/A;   CYSTOSCOPY WITH URETHRAL DILATATION N/A 06/15/2019   Procedure: CYSTOSCOPY WITH BALLOON URETHRAL DILATATION;  Surgeon: [name], MD;  Location:  [place] [facility];  Service: Urology;  Laterality: N/A;   LEFT HEART CATH AND CORONARY ANGIOGRAPHY N/A 06/14/2019   Procedure: LEFT HEART CATH AND CORONARY ANGIOGRAPHY;  Surgeon: [name], MD;  Location: [facility];  Service: Cardiovascular;  Laterality: N/A;   MULTIPLE EXTRACTIONS WITH ALVEOLOPLASTY N/A 10/12/2017   Procedure: MULTIPLE EXTRACTION WITH ALVEOLOPLASTY;  Surgeon: [name], DDS;  Location: [place];  Service: Oral Surgery;  Laterality: N/A;   PELVIC LYMPH NODE DISSECTION Bilateral 11/04/2018   Procedure:  PELVIC LYMPH NODE DISSECTION;  Surgeon: [name], MD;  Location: [facility];  Service: Urology;  Laterality: Bilateral;   PROSTATE BIOPSY N/A 07/15/2014   Procedure: SATURATION BIOPSY TRANSRECTAL ULTRASONIC PROSTATE (TUBP);  Surgeon: [name], MD;  Location[facility];  Service: Urology;  Laterality: N/A;   PROSTATE BIOPSY N/A 08/07/2018   Procedure: BIOPSY TRANSRECTAL ULTRASONIC PROSTATE (TUBP);  Surgeon: [name], MD;  Location: [facility];  Service: Urology;  Laterality: N/A;  ONLY NEEDS 30 MIN   RIGHT KNEE ARTHROSCOPY /  MENISECTOMY/  DEBRIDEMENT OF CHONDROMALACIA PATELLA AND MEDIAL SHELF PLICA/  SUBCHONDROPLASTY  06-10-2010   ROBOT ASSISTED LAPAROSCOPIC RADICAL PROSTATECTOMY N/A 11/04/2018   Procedure: XI ROBOTIC ASSISTED LAPAROSCOPIC RADICAL PROSTATECTOMY;  Surgeon: [name], MD;  Location: [facility];  Service: Urology;  Laterality: N/A;   TOTAL KNEE ARTHROPLASTY  05/31/2011   Procedure: TOTAL KNEE ARTHROPLASTY;  Surgeon: [name], MD;  Location: [place];  Service: Orthopedics;  Laterality: Right;  with revision stem    Prior to Admission medications   Medication Sig Start Date End Date Taking? Authorizing Provider  acetaminophen (TYLENOL) 500 MG tablet Take 500 mg by mouth daily as needed for headache or mild pain.   Yes [provider]  aspirin 81 MG chewable tablet Chew 1 tablet (81 mg total) by mouth daily. 06/21/19  Yes Mercy Riding, MD  atorvastatin (LIPITOR) 80 MG tablet Take 1 tablet (80 mg total) by mouth daily. 06/21/19  Yes Mercy Riding, MD  gemfibrozil (LOPID) 600 MG tablet Take 600 mg by mouth daily. 09/26/17  Yes [provider]  Glycerin-Hypromellose-PEG 400 (VISINE DRY EYE OP) Place 1 drop into both eyes 2 (two) times daily as needed (dry eyes, redness).   Yes [provider]  lisinopril (ZESTRIL) 20 MG tablet Take 20 mg by mouth daily. 04/30/21  Yes [provider]   metoprolol tartrate (LOPRESSOR) 25 MG tablet Take 25 mg by mouth daily.   Yes [provider]  ticagrelor (BRILINTA) 90 MG TABS tablet Take 1 tablet (90 mg total) by mouth 2 (two) times daily. 06/20/19  Yes Dunn, Dayna N, PA-C  lisinopril (ZESTRIL) 10 MG tablet Take 1 tablet (10 mg total) by mouth daily. Patient not taking: Reported on 06/23/2021 06/20/19 12/17/19  Mercy Riding, MD  potassium chloride (KLOR-CON) 10 MEQ tablet TAKE 1 TABLET(10 MEQ) BY MOUTH 1 TIME FOR 1 DOSE Patient not taking: Reported on 06/23/2021 12/08/20   Sherren Mocha, MD    Current Facility-Administered Medications  Medication Dose Route Frequency Provider Last Rate Last Admin   0.9 %  sodium chloride infusion  10 mL/hr Intravenous Once Margarita Mail, PA-C       acetaminophen (TYLENOL) tablet 650 mg  650 mg Oral Q6H PRN Irene Pap N, DO       atorvastatin (LIPITOR) tablet 80 mg  80 mg Oral Daily Kayleen Memos, DO  lactated ringers infusion   Intravenous Continuous Kayleen Memos, DO 50 mL/hr at 06/23/21 0519 New Bag at 06/23/21 0519   melatonin tablet 5 mg  5 mg Oral QHS PRN Kayleen Memos, DO       [START ON 06/26/2021] pantoprazole (PROTONIX) injection 40 mg  40 mg Intravenous Q12H Hall, Carole N, DO       pantoprozole (PROTONIX) 80 mg /NS 100 mL infusion  8 mg/hr Intravenous Continuous Irene Pap N, DO 10 mL/hr at 06/23/21 0304 8 mg/hr at 06/23/21 0304   polyethylene glycol (MIRALAX / GLYCOLAX) packet 17 g  17 g Oral Daily PRN Irene Pap N, DO       prochlorperazine (COMPAZINE) injection 10 mg  10 mg Intravenous Q6H PRN Kayleen Memos, DO       Current Outpatient Medications  Medication Sig Dispense Refill   acetaminophen (TYLENOL) 500 MG tablet Take 500 mg by mouth daily as needed for headache or mild pain.     aspirin 81 MG chewable tablet Chew 1 tablet (81 mg total) by mouth daily. 90 tablet 1   atorvastatin (LIPITOR) 80 MG tablet Take 1 tablet (80 mg total) by mouth daily. 90 tablet 1    gemfibrozil (LOPID) 600 MG tablet Take 600 mg by mouth daily.  3   Glycerin-Hypromellose-PEG 400 (VISINE DRY EYE OP) Place 1 drop into both eyes 2 (two) times daily as needed (dry eyes, redness).     lisinopril (ZESTRIL) 20 MG tablet Take 20 mg by mouth daily.     metoprolol tartrate (LOPRESSOR) 25 MG tablet Take 25 mg by mouth daily.     ticagrelor (BRILINTA) 90 MG TABS tablet Take 1 tablet (90 mg total) by mouth 2 (two) times daily. 60 tablet 11   lisinopril (ZESTRIL) 10 MG tablet Take 1 tablet (10 mg total) by mouth daily. (Patient not taking: Reported on 06/23/2021) 90 tablet 1   potassium chloride (KLOR-CON) 10 MEQ tablet TAKE 1 TABLET(10 MEQ) BY MOUTH 1 TIME FOR 1 DOSE (Patient not taking: Reported on 06/23/2021) 30 tablet 1   Facility-Administered Medications Ordered in Other Encounters  Medication Dose Route Frequency Provider Last Rate Last Admin   sodium phosphate (FLEET) 7-19 GM/118ML enema 1 enema  1 enema Rectal Once Winter, Christopher Aaron, MD        Allergies as of 06/22/2021   (No Known Allergies)    Family History  Problem Relation Age of Onset   Prostate cancer Neg Hx    Colon cancer Neg Hx    Breast cancer Neg Hx     Social History   Socioeconomic History   Marital status: Single    Spouse name: Not on file   Number of children: 4   Years of education: Not on file   Highest education level: Not on file  Occupational History    Comment: disability  Tobacco Use   Smoking status: Former    Packs/day: 0.50    Years: 4.00    Total pack years: 2.00    Types: Cigarettes    Quit date: 08/28/2018    Years since quitting: 2.8   Smokeless tobacco: Never  Vaping Use   Vaping Use: Never used  Substance and Sexual Activity   Alcohol use: Yes    Comment: SOCIAL 1 beer a day   Drug use: Never    Comment: hx marijuana use- none recently states on 10/30/2018   Sexual activity: Yes  Other Topics Concern   Not on file  Social History Narrative   ** Merged History  Encounter **       His daughter is a Marine scientist at [employer], who works night shift.   Social Determinants of Health   Financial Resource Strain: Not on file  Food Insecurity: Not on file  Transportation Needs: Not on file  Physical Activity: Not on file  Stress: Not on file  Social Connections: Not on file  Intimate Partner Violence: Not on file    Review of Systems: Pertinent positive and negative review of systems were noted in the above HPI section.  All other review of systems was otherwise negative.   Physical Exam: Vital signs in last 24 hours: Temp:  [98.3 F (36.8 C)-98.6 F (37 C)] 98.3 F (36.8 C) (06/15 0516) Pulse Rate:  [71-93] 71 (06/15 0630) Resp:  [15-25] 15 (06/15 0630) BP: (112-138)/(66-89) 126/89 (06/15 0630) SpO2:  [98 %-100 %] 99 % (06/15 0630) Weight:  [84.4 kg] 84.4 kg (06/14 2248)   General:   Alert,  Well-developed, well-nourished,AA male  pleasant and cooperative in NAD Head:  Normocephalic and atraumatic. Eyes:  Sclera clear, no icterus.   Conjunctiva pink. Ears:  Normal auditory acuity. Nose:  No deformity, discharge,  or lesions. Mouth:  No deformity or lesions.   Neck:  Supple; no masses or thyromegaly. Lungs:  Clear throughout to auscultation.   No wheezes, crackles, or rhonchi.  Heart:  Regular rate and rhythm; no murmurs, clicks, rubs,  or gallops. Abdomen:  Soft,nontender, BS active,nonpalp mass or hsm. Pert scars, RLQ scar  Rectal: not done-  ER witnessed bloody emesis Msk:  Symmetrical without gross deformities. . Pulses:  Normal pulses noted. Extremities:  Without clubbing or edema. Neurologic:  Alert and  oriented x4;  grossly normal neurologically. Skin:  Intact without significant lesions or rashes.. Psych:  Alert and cooperative. Normal mood and affect.  Intake/Output from previous day: 06/14 0701 - 06/15 0700 In: 315 [Blood:315] Out: -  Intake/Output this shift: No intake/output data recorded.  Lab  Results: Recent Labs    06/22/21 2251 06/23/21 0700  WBC 8.1 9.6  HGB 6.2* 9.1*  HCT 20.7* 28.0*  PLT 244 219   BMET Recent Labs    06/22/21 2251 06/23/21 0700  NA 143 140  K 3.6 4.0  CL 113* 109  CO2 21* 22  GLUCOSE 169* 111*  BUN 24* 18  CREATININE 1.24 0.87  CALCIUM 8.7* 8.7*   LFT Recent Labs    06/23/21 0700  PROT 5.4*  ALBUMIN 3.2*  AST 13*  ALT 14  ALKPHOS 26*  BILITOT 2.3*   PT/INR Recent Labs    06/22/21 2251  LABPROT 14.2  INR 1.1   Hepatitis Panel No results for input(s): "HEPBSAG", "HCVAB", "HEPAIGM", "HEPBIGM" in the last 72 hours.    IMPRESSION:  #56 61 year old African-American male, on Brilinta and aspirin presenting with dark stool over the past 3 to 4 days, nausea, then hematemesis yesterday and more grossly bloody stool.  Found to have hemoglobin of 6.2 on arrival, INR 1.1  CT angiography showed no evidence of extravasation of contrast or active GI bleed, this does show focal area of inflammatory change and thickening along the wall of the stomach involving the lesser curve there is a punctate focus of air along the wall of the lesser curve which may represent air in a gastric ulcer or possibly focally contained microperforation.  Patient has been hemodynamically stable, no prior GI issues or evaluation  Responded appropriately to  transfusions and hemoglobin 9.1 this morning  Suspect acute upper GI bleed secondary to gastric ulcer  #2 anemia acute secondary to acute GI bleed #3 coronary artery disease status post MI 2021 with cardiac arrest-status post PCI/PTCA and DES proximal circumflex #4 history of prostate CA status post surgery and chemotherapy #5 history of hypertension   PLAN: Keep n.p.o. Continue PPI infusion Serial hemoglobins every 6 hours and transfuse to keep hemoglobin 8 or above I have tentatively scheduled for EGD with Dr. Carlean Purl this afternoon.  Procedure was discussed in detail with the patient including  indications risk and benefits and he is agreeable to proceed. Will review CT angiography findings with Dr. Carlean Purl given concerns for possible microperforation, which may affect decision regarding timing of EGD Continue off aspirin and Brilinta  Townes Fuhs PA-C 06/23/2021, 9:33 AM

## 2021-06-23 NOTE — H&P (View-Only) (Signed)
Consultation  Referring Provider:  [facility]/ [place] Crane DO Primary Care Physician:  Gevena Mart, DO Primary Gastroenterologist: none unassigned  Reason for Consultation:   acute GI bleed  HPI: Raymond [name] is a 61 y.o. male, who presented to the emergency room late last evening after an episode of bloody emesis.  Patient related that he had been seeing black stools over the previous couple of days, and had been nauseated at home with some dry heaves.  He also relates some mild epigastric discomfort.  By his report his stool was very dark but had some tinge of red.  He was brought in by EMS, and on arrival to the ER had 300 cc of dark bloody emesis.  He also had another episode of large bloody stool. Patient has been on aspirin and Brilinta, with history of MI 2021, hypertension, prostate cancer status post surgery and chemotherapy. He says he was feeling bad over this past weekend and had been having some intermittent chest pain, so stopped taking all of his medicines on Sunday including the aspirin and Brilinta. He has not had any prior history of GI bleeding, no prior GI work-up.  Relates that he did do a Cologuard per his PCP earlier this year which was negative. He has not been using any NSAIDs, Does drink alcohol on a frequent basis.  He relates drinking a lot of alcohol over Memorial Day weekend but most days just drinks a couple of beers. Labs on admission WBC 8.1/hemoglobin 6.2/hematocrit 20.7/MCV 79/platelets 244 INR 1.1 BUN 20/creatinine 1.24 LFTs within normal limits  CT angio was done-no focal liver abnormalities, no gallstones no extravasated contrast or evidence of active GI bleeding, there is a focal area of inflammatory changes and thickening of the wall of the stomach, lesser curve, and an apparent focal area of discontinuity of the wall of the stomach possibly ulcer-there is a punctate focus of air along the wall of the lesser curve of the stomach, question air and a gastric ulcer  or possibly focally contained microperforation, scattered diverticulosis  He has been hemodynamically stable, has been transfused 2 units of packed RBCs and this morning hemoglobin 9.1/hematocrit 28 Says he feels a lot better since the transfusions, no further vomiting or hematemesis.  Denies any chest pain or shortness of breath   Past Medical History:  Diagnosis Date   Arthritis    At risk for sleep apnea    STOP-BANG= 4    SENT TO PCP 07-09-2014   BPH (benign prostatic hypertrophy)    Cardiac arrest ([place]) 06/14/2019   Dental caries    periodontitis   Depression    Elevated PSA    History of concussion    yrs ago-- no LOC-- no residual   History of depression    Hypertension    Nocturia    Prostate cancer ([place])    Wears glasses     Past Surgical History:  Procedure Laterality Date   APPENDECTOMY  age 55   CORONARY STENT INTERVENTION N/A 06/14/2019   Procedure: CORONARY STENT INTERVENTION;  Surgeon: [name], MD;  Location: [facility];  Service: Cardiovascular;  Laterality: N/A;   CORONARY/GRAFT ACUTE MI REVASCULARIZATION N/A 06/14/2019   Procedure: Coronary/Graft Acute MI Revascularization;  Surgeon: [name], MD;  Location: [facility];  Service: Cardiovascular;  Laterality: N/A;   CYSTOSCOPY WITH URETHRAL DILATATION N/A 06/15/2019   Procedure: CYSTOSCOPY WITH BALLOON URETHRAL DILATATION;  Surgeon: [name], MD;  Location:  [facility];  Service: Urology;  Laterality: N/A;   LEFT HEART CATH AND CORONARY ANGIOGRAPHY N/A 06/14/2019   Procedure: LEFT HEART CATH AND CORONARY ANGIOGRAPHY;  Surgeon: [name], MD;  Location: [facility];  Service: Cardiovascular;  Laterality: N/A;   MULTIPLE EXTRACTIONS WITH ALVEOLOPLASTY N/A 10/12/2017   Procedure: MULTIPLE EXTRACTION WITH ALVEOLOPLASTY;  Surgeon: [name], DDS;  Location: [facility];  Service: Oral Surgery;  Laterality: N/A;   PELVIC LYMPH NODE DISSECTION Bilateral 11/04/2018   Procedure:  PELVIC LYMPH NODE DISSECTION;  Surgeon: [name], MD;  Location: [facility];  Service: Urology;  Laterality: Bilateral;   PROSTATE BIOPSY N/A 07/15/2014   Procedure: SATURATION BIOPSY TRANSRECTAL ULTRASONIC PROSTATE (TUBP);  Surgeon: [name], MD;  Location: [facility];  Service: Urology;  Laterality: N/A;   PROSTATE BIOPSY N/A 08/07/2018   Procedure: BIOPSY TRANSRECTAL ULTRASONIC PROSTATE (TUBP);  Surgeon: [name], MD;  Location: [facility];  Service: Urology;  Laterality: N/A;  ONLY NEEDS 30 MIN   RIGHT KNEE ARTHROSCOPY /  MENISECTOMY/  DEBRIDEMENT OF CHONDROMALACIA PATELLA AND MEDIAL SHELF PLICA/  SUBCHONDROPLASTY  06-10-2010   ROBOT ASSISTED LAPAROSCOPIC RADICAL PROSTATECTOMY N/A 11/04/2018   Procedure: XI ROBOTIC ASSISTED LAPAROSCOPIC RADICAL PROSTATECTOMY;  Surgeon: [name], MD;  Location: [facility];  Service: Urology;  Laterality: N/A;   TOTAL KNEE ARTHROPLASTY  05/31/2011   Procedure: TOTAL KNEE ARTHROPLASTY;  Surgeon: [name], MD;  Location: [facility];  Service: Orthopedics;  Laterality: Right;  with revision stem    Prior to Admission medications   Medication Sig Start Date End Date Taking? Authorizing Provider  acetaminophen (TYLENOL) 500 MG tablet Take 500 mg by mouth daily as needed for headache or mild pain.   Yes [provider]  aspirin 81 MG chewable tablet Chew 1 tablet (81 mg total) by mouth daily. 06/21/19  Yes Mercy Riding, MD  atorvastatin (LIPITOR) 80 MG tablet Take 1 tablet (80 mg total) by mouth daily. 06/21/19  Yes Mercy Riding, MD  gemfibrozil (LOPID) 600 MG tablet Take 600 mg by mouth daily. 09/26/17  Yes [provider]  Glycerin-Hypromellose-PEG 400 (VISINE DRY EYE OP) Place 1 drop into both eyes 2 (two) times daily as needed (dry eyes, redness).   Yes [provider]  lisinopril (ZESTRIL) 20 MG tablet Take 20 mg by mouth daily. 04/30/21  Yes [provider]   metoprolol tartrate (LOPRESSOR) 25 MG tablet Take 25 mg by mouth daily.   Yes [provider]  ticagrelor (BRILINTA) 90 MG TABS tablet Take 1 tablet (90 mg total) by mouth 2 (two) times daily. 06/20/19  Yes Dunn, Dayna N, PA-C  lisinopril (ZESTRIL) 10 MG tablet Take 1 tablet (10 mg total) by mouth daily. Patient not taking: Reported on 06/23/2021 06/20/19 12/17/19  Mercy Riding, MD  potassium chloride (KLOR-CON) 10 MEQ tablet TAKE 1 TABLET(10 MEQ) BY MOUTH 1 TIME FOR 1 DOSE Patient not taking: Reported on 06/23/2021 12/08/20   Sherren Mocha, MD    Current Facility-Administered Medications  Medication Dose Route Frequency Provider Last Rate Last Admin   0.9 %  sodium chloride infusion  10 mL/hr Intravenous Once Margarita Mail, PA-C       acetaminophen (TYLENOL) tablet 650 mg  650 mg Oral Q6H PRN Irene Pap N, DO       atorvastatin (LIPITOR) tablet 80 mg  80 mg Oral Daily Kayleen Memos, DO  lactated ringers infusion   Intravenous Continuous Kayleen Memos, DO 50 mL/hr at 06/23/21 0519 New Bag at 06/23/21 0519   melatonin tablet 5 mg  5 mg Oral QHS PRN Kayleen Memos, DO       [START ON 06/26/2021] pantoprazole (PROTONIX) injection 40 mg  40 mg Intravenous Q12H Hall, Carole N, DO       pantoprozole (PROTONIX) 80 mg /NS 100 mL infusion  8 mg/hr Intravenous Continuous Irene Pap N, DO 10 mL/hr at 06/23/21 0304 8 mg/hr at 06/23/21 0304   polyethylene glycol (MIRALAX / GLYCOLAX) packet 17 g  17 g Oral Daily PRN Irene Pap N, DO       prochlorperazine (COMPAZINE) injection 10 mg  10 mg Intravenous Q6H PRN Kayleen Memos, DO       Current Outpatient Medications  Medication Sig Dispense Refill   acetaminophen (TYLENOL) 500 MG tablet Take 500 mg by mouth daily as needed for headache or mild pain.     aspirin 81 MG chewable tablet Chew 1 tablet (81 mg total) by mouth daily. 90 tablet 1   atorvastatin (LIPITOR) 80 MG tablet Take 1 tablet (80 mg total) by mouth daily. 90 tablet 1    gemfibrozil (LOPID) 600 MG tablet Take 600 mg by mouth daily.  3   Glycerin-Hypromellose-PEG 400 (VISINE DRY EYE OP) Place 1 drop into both eyes 2 (two) times daily as needed (dry eyes, redness).     lisinopril (ZESTRIL) 20 MG tablet Take 20 mg by mouth daily.     metoprolol tartrate (LOPRESSOR) 25 MG tablet Take 25 mg by mouth daily.     ticagrelor (BRILINTA) 90 MG TABS tablet Take 1 tablet (90 mg total) by mouth 2 (two) times daily. 60 tablet 11   lisinopril (ZESTRIL) 10 MG tablet Take 1 tablet (10 mg total) by mouth daily. (Patient not taking: Reported on 06/23/2021) 90 tablet 1   potassium chloride (KLOR-CON) 10 MEQ tablet TAKE 1 TABLET(10 MEQ) BY MOUTH 1 TIME FOR 1 DOSE (Patient not taking: Reported on 06/23/2021) 30 tablet 1   Facility-Administered Medications Ordered in Other Encounters  Medication Dose Route Frequency Provider Last Rate Last Admin   sodium phosphate (FLEET) 7-19 GM/118ML enema 1 enema  1 enema Rectal Once Winter, Christopher Aaron, MD        Allergies as of 06/22/2021   (No Known Allergies)    Family History  Problem Relation Age of Onset   Prostate cancer Neg Hx    Colon cancer Neg Hx    Breast cancer Neg Hx     Social History   Socioeconomic History   Marital status: Single    Spouse name: Not on file   Number of children: 4   Years of education: Not on file   Highest education level: Not on file  Occupational History    Comment: disability  Tobacco Use   Smoking status: Former    Packs/day: 0.50    Years: 4.00    Total pack years: 2.00    Types: Cigarettes    Quit date: 08/28/2018    Years since quitting: 2.8   Smokeless tobacco: Never  Vaping Use   Vaping Use: Never used  Substance and Sexual Activity   Alcohol use: Yes    Comment: SOCIAL 1 beer a day   Drug use: Never    Comment: hx marijuana use- none recently states on 10/30/2018   Sexual activity: Yes  Other Topics Concern   Not on file  Social History Narrative   ** Merged History  Encounter **       His daughter is a Marine scientist at [employer], who works night shift.   Social Determinants of Health   Financial Resource Strain: Not on file  Food Insecurity: Not on file  Transportation Needs: Not on file  Physical Activity: Not on file  Stress: Not on file  Social Connections: Not on file  Intimate Partner Violence: Not on file    Review of Systems: Pertinent positive and negative review of systems were noted in the above HPI section.  All other review of systems was otherwise negative.   Physical Exam: Vital signs in last 24 hours: Temp:  [98.3 F (36.8 C)-98.6 F (37 C)] 98.3 F (36.8 C) (06/15 0516) Pulse Rate:  [71-93] 71 (06/15 0630) Resp:  [15-25] 15 (06/15 0630) BP: (112-138)/(66-89) 126/89 (06/15 0630) SpO2:  [98 %-100 %] 99 % (06/15 0630) Weight:  [84.4 kg] 84.4 kg (06/14 2248)   General:   Alert,  Well-developed, well-nourished,AA male  pleasant and cooperative in NAD Head:  Normocephalic and atraumatic. Eyes:  Sclera clear, no icterus.   Conjunctiva pink. Ears:  Normal auditory acuity. Nose:  No deformity, discharge,  or lesions. Mouth:  No deformity or lesions.   Neck:  Supple; no masses or thyromegaly. Lungs:  Clear throughout to auscultation.   No wheezes, crackles, or rhonchi.  Heart:  Regular rate and rhythm; no murmurs, clicks, rubs,  or gallops. Abdomen:  Soft,nontender, BS active,nonpalp mass or hsm. Pert scars, RLQ scar  Rectal: not done-  ER witnessed bloody emesis Msk:  Symmetrical without gross deformities. . Pulses:  Normal pulses noted. Extremities:  Without clubbing or edema. Neurologic:  Alert and  oriented x4;  grossly normal neurologically. Skin:  Intact without significant lesions or rashes.. Psych:  Alert and cooperative. Normal mood and affect.  Intake/Output from previous day: 06/14 0701 - 06/15 0700 In: 315 [Blood:315] Out: -  Intake/Output this shift: No intake/output data recorded.  Lab  Results: Recent Labs    06/22/21 2251 06/23/21 0700  WBC 8.1 9.6  HGB 6.2* 9.1*  HCT 20.7* 28.0*  PLT 244 219   BMET Recent Labs    06/22/21 2251 06/23/21 0700  NA 143 140  K 3.6 4.0  CL 113* 109  CO2 21* 22  GLUCOSE 169* 111*  BUN 24* 18  CREATININE 1.24 0.87  CALCIUM 8.7* 8.7*   LFT Recent Labs    06/23/21 0700  PROT 5.4*  ALBUMIN 3.2*  AST 13*  ALT 14  ALKPHOS 26*  BILITOT 2.3*   PT/INR Recent Labs    06/22/21 2251  LABPROT 14.2  INR 1.1   Hepatitis Panel No results for input(s): "HEPBSAG", "HCVAB", "HEPAIGM", "HEPBIGM" in the last 72 hours.    IMPRESSION:  #42 61 year old African-American male, on Brilinta and aspirin presenting with dark stool over the past 3 to 4 days, nausea, then hematemesis yesterday and more grossly bloody stool.  Found to have hemoglobin of 6.2 on arrival, INR 1.1  CT angiography showed no evidence of extravasation of contrast or active GI bleed, this does show focal area of inflammatory change and thickening along the wall of the stomach involving the lesser curve there is a punctate focus of air along the wall of the lesser curve which may represent air in a gastric ulcer or possibly focally contained microperforation.  Patient has been hemodynamically stable, no prior GI issues or evaluation  Responded appropriately to  transfusions and hemoglobin 9.1 this morning  Suspect acute upper GI bleed secondary to gastric ulcer  #2 anemia acute secondary to acute GI bleed #3 coronary artery disease status post MI 2021 with cardiac arrest-status post PCI/PTCA and DES proximal circumflex #4 history of prostate CA status post surgery and chemotherapy #5 history of hypertension   PLAN: Keep n.p.o. Continue PPI infusion Serial hemoglobins every 6 hours and transfuse to keep hemoglobin 8 or above I have tentatively scheduled for EGD with Dr. Carlean Purl this afternoon.  Procedure was discussed in detail with the patient including  indications risk and benefits and he is agreeable to proceed. Will review CT angiography findings with Dr. Carlean Purl given concerns for possible microperforation, which may affect decision regarding timing of EGD Continue off aspirin and Brilinta  Karmyn Lowman PA-C 06/23/2021, 9:33 AM

## 2021-06-23 NOTE — ED Notes (Signed)
ED TO INPATIENT HANDOFF REPORT  ED Nurse Name and Phone #: Lysbeth Galas [phone]  S Name/Age/Gender Raymond [name] 61 y.o. male Room/Bed: 017C/017C  Code Status   Code Status: Prior  Home/SNF/Other Home Patient oriented to: self, place, time, and situation Is this baseline? Yes   Triage Complete: Triage complete  Chief Complaint GI bleeding [K92.2]  Triage Note Pt states he began vomiting large amounts of dark blood today. EMS estimates around 300cc that they seen. Pt states he also started having dark stool about 2 days ago. No history of gi bleeds. Pt takes ASA.   Allergies No Known Allergies  Level of Care/Admitting Diagnosis ED Disposition     ED Disposition  Admit   Condition  --   Comment  Hospital Area: [facility] [[id number]]  Level of Care: Progressive [102]  Admit to Progressive based on following criteria: GI, ENDOCRINE disease patients with GI bleeding, acute liver failure or pancreatitis, stable with diabetic ketoacidosis or thyrotoxicosis (hypothyroid) state.  May admit patient to [facility] or [facility] if equivalent level of care is available:: No  Covid Evaluation: Asymptomatic - no recent exposure (last 10 days) testing not required  Diagnosis: GI bleeding [196340]  Admitting Physician: Kayleen Memos [[id number]]  Attending Physician: Kayleen Memos [[id number]]  Estimated length of stay: past midnight tomorrow  Certification:: I certify this patient will need inpatient services for at least 2 midnights          B Medical/Surgery History Past Medical History:  Diagnosis Date   Arthritis    At risk for sleep apnea    STOP-BANG= 4    SENT TO PCP 07-09-2014   BPH (benign prostatic hypertrophy)    Cardiac arrest ([place]) 06/14/2019   Dental caries    periodontitis   Depression    Elevated PSA    History of concussion    yrs ago-- no LOC-- no residual   History of depression    Hypertension    Nocturia    Prostate cancer ([place])     Wears glasses    Past Surgical History:  Procedure Laterality Date   APPENDECTOMY  age 45   CORONARY STENT INTERVENTION N/A 06/14/2019   Procedure: CORONARY STENT INTERVENTION;  Surgeon: [name], MD;  Location: [facility];  Service: Cardiovascular;  Laterality: N/A;   CORONARY/GRAFT ACUTE MI REVASCULARIZATION N/A 06/14/2019   Procedure: Coronary/Graft Acute MI Revascularization;  Surgeon: [name], MD;  Location: [facility];  Service: Cardiovascular;  Laterality: N/A;   CYSTOSCOPY WITH URETHRAL DILATATION N/A 06/15/2019   Procedure: CYSTOSCOPY WITH BALLOON URETHRAL DILATATION;  Surgeon: [name], MD;  Location: [facility];  Service: Urology;  Laterality: N/A;   LEFT HEART CATH AND CORONARY ANGIOGRAPHY N/A 06/14/2019   Procedure: LEFT HEART CATH AND CORONARY ANGIOGRAPHY;  Surgeon: [name], MD;  Location: [facility];  Service: Cardiovascular;  Laterality: N/A;   MULTIPLE EXTRACTIONS WITH ALVEOLOPLASTY N/A 10/12/2017   Procedure: MULTIPLE EXTRACTION WITH ALVEOLOPLASTY;  Surgeon: [name], DDS;  Location: [facility];  Service: Oral Surgery;  Laterality: N/A;   PELVIC LYMPH NODE DISSECTION Bilateral 11/04/2018   Procedure: PELVIC LYMPH NODE DISSECTION;  Surgeon: [name], MD;  Location: [facility];  Service: Urology;  Laterality: Bilateral;   PROSTATE BIOPSY N/A 07/15/2014   Procedure: SATURATION BIOPSY TRANSRECTAL ULTRASONIC PROSTATE (TUBP);  Surgeon: [name], MD;  Location: [facility];  Service: Urology;  Laterality: N/A;  PROSTATE BIOPSY N/A 08/07/2018   Procedure: BIOPSY TRANSRECTAL ULTRASONIC PROSTATE (TUBP);  Surgeon: [name], MD;  Location: [facility];  Service: Urology;  Laterality: N/A;  ONLY NEEDS 30 MIN   RIGHT KNEE ARTHROSCOPY /  MENISECTOMY/  DEBRIDEMENT OF CHONDROMALACIA PATELLA AND MEDIAL SHELF PLICA/  SUBCHONDROPLASTY  06-10-2010   ROBOT ASSISTED LAPAROSCOPIC RADICAL PROSTATECTOMY N/A  11/04/2018   Procedure: XI ROBOTIC ASSISTED LAPAROSCOPIC RADICAL PROSTATECTOMY;  Surgeon: [name], MD;  [facility];  Service: Urology;  Laterality: N/A;   TOTAL KNEE ARTHROPLASTY  05/31/2011   Procedure: TOTAL KNEE ARTHROPLASTY;  Surgeon: [name], MD;  Location: [facility];  Service: Orthopedics;  Laterality: Right;  with revision stem     A IV Location/Drains/Wounds Patient Lines/Drains/Airways Status     Active Line/Drains/Airways     Name Placement date Placement time Site Days   Peripheral IV 06/22/21 18 G 1" Left Antecubital 06/22/21  2210  Antecubital  1   Peripheral IV 06/23/21 18 G 1" Right Antecubital 06/23/21  0019  Antecubital  less than 1   Incision 05/31/11 Knee Right 05/31/11  1105  -- 3676   Incision (Closed) 07/15/14 N/A Other (Comment) 07/15/14  1003  -- 2535   Incision (Closed) 08/07/18 N/A 08/07/18  1138  -- 1051   Incision (Closed) 06/15/19 Penis 06/15/19  1009  -- 739   Incision - 6 Ports Abdomen 1: Left;Lateral;Lower 2: Left;Lateral;Upper 3: Umbilicus;Superior 4: Right;Lateral;Lower 5: Right;Medial;Upper 6: Right;Lateral;Upper 11/04/18  0820  -- 962            Intake/Output Last 24 hours  Intake/Output Summary (Last 24 hours) at 06/23/2021 1208 Last data filed at 06/23/2021 0516 Gross per 24 hour  Intake 315 ml  Output --  Net 315 ml    Labs/Imaging Results for orders placed or performed during the hospital encounter of 06/22/21 (from the past 48 hour(s))  Comprehensive metabolic panel     Status: Abnormal   Collection Time: 06/22/21 10:51 PM  Result Value Ref Range   Sodium 143 135 - 145 mmol/L   Potassium 3.6 3.5 - 5.1 mmol/L   Chloride 113 (H) 98 - 111 mmol/L   CO2 21 (L) 22 - 32 mmol/L   Glucose, Bld 169 (H) 70 - 99 mg/dL    Comment: Glucose reference range applies only to samples taken after fasting for at least 8 hours.   BUN 24 (H) 8 - 23 mg/dL   Creatinine, Ser 1.24 0.61 - 1.24 mg/dL   Calcium 8.7 (L) 8.9 - 10.3 mg/dL    Total Protein 5.0 (L) 6.5 - 8.1 g/dL   Albumin 2.9 (L) 3.5 - 5.0 g/dL   AST 19 15 - 41 U/L   ALT 15 0 - 44 U/L   Alkaline Phosphatase 24 (L) 38 - 126 U/L   Total Bilirubin 0.6 0.3 - 1.2 mg/dL   GFR, Estimated >60 >60 mL/min    Comment: (NOTE) Calculated using the CKD-EPI Creatinine Equation (2021)    Anion gap 9 5 - 15    Comment: Performed at [facility], [place] [address] [place],  [address]  CBC with Differential     Status: Abnormal   Collection Time: 06/22/21 10:51 PM  Result Value Ref Range   WBC 8.1 4.0 - 10.5 K/uL   RBC 2.59 (L) 4.22 - 5.81 MIL/uL   Hemoglobin 6.2 (LL) 13.0 - 17.0 g/dL    Comment: REPEATED TO VERIFY THIS CRITICAL RESULT HAS  VERIFIED AND BEEN CALLED TO JEREMY LYNCH, RN BY HAYLEE HOWARD ON 06 14 2023 AT 2326, AND HAS BEEN READ BACK.     HCT 20.7 (L) 39.0 - 52.0 %   MCV 79.9 (L) 80.0 - 100.0 fL   MCH 23.9 (L) 26.0 - 34.0 pg   MCHC 30.0 30.0 - 36.0 g/dL   RDW 17.6 (H) 11.5 - 15.5 %   Platelets 244 150 - 400 K/uL   nRBC 0.0 0.0 - 0.2 %   Neutrophils Relative % 76 %   Neutro Abs 6.2 1.7 - 7.7 K/uL   Lymphocytes Relative 13 %   Lymphs Abs 1.0 0.7 - 4.0 K/uL   Monocytes Relative 9 %   Monocytes Absolute 0.7 0.1 - 1.0 K/uL   Eosinophils Relative 1 %   Eosinophils Absolute 0.1 0.0 - 0.5 K/uL   Basophils Relative 0 %   Basophils Absolute 0.0 0.0 - 0.1 K/uL   Immature Granulocytes 1 %   Abs Immature Granulocytes 0.05 0.00 - 0.07 K/uL    Comment: Performed at [facility] [address] [place], [place] [address]  Protime-INR     Status: None   Collection Time: 06/22/21 10:51 PM  Result Value Ref Range   Prothrombin Time 14.2 11.4 - 15.2 seconds   INR 1.1 0.8 - 1.2    Comment: (NOTE) INR goal varies based on device and disease states. Performed at [facility], [place] [address], [place], [place] [address]   Type and screen [place]     Status: None (Preliminary result)   Collection Time: 06/22/21 10:51 PM   Result Value Ref Range   ABO/RH(D) A NEG    Antibody Screen NEG    Sample Expiration 06/25/2021,2359    Unit Number [id number]    Blood Component Type RED CELLS,LR    Unit division 00    Status of Unit ISSUED    Transfusion Status OK TO TRANSFUSE    Crossmatch Result      Compatible Performed at [facility], [place] [address] [place], [place] [address]    Unit Number [id number]    Blood Component Type RED CELLS,LR    Unit division 00    Status of Unit ISSUED    Transfusion Status OK TO TRANSFUSE    Crossmatch Result Compatible   Lipase, blood     Status: None   Collection Time: 06/22/21 10:51 PM  Result Value Ref Range   Lipase 33 11 - 51 U/L    Comment: Performed at [facility], [place] [address] [place], [place] [address]  POC occult blood, ED     Status: Abnormal   Collection Time: 06/22/21 11:00 PM  Result Value Ref Range   Fecal Occult Bld POSITIVE (A) NEGATIVE  Prepare RBC (crossmatch)     Status: None   Collection Time: 06/22/21 11:35 PM  Result Value Ref Range   Order Confirmation      ORDER PROCESSED BY BLOOD BANK Performed at [facility], [place] [address] [place],  [address]   CBC with Differential/Platelet     Status: Abnormal   Collection Time: 06/23/21  7:00 AM  Result Value Ref Range   WBC 9.6 4.0 - 10.5 K/uL   RBC 3.53 (L) 4.22 - 5.81 MIL/uL   Hemoglobin 9.1 (L) 13.0 - 17.0 g/dL    Comment: REPEATED TO VERIFY POST TRANSFUSION SPECIMEN    HCT 28.0 (L) 39.0 - 52.0 %  MCV 79.3 (L) 80.0 - 100.0 fL   MCH 25.8 (L) 26.0 - 34.0 pg   MCHC 32.5 30.0 - 36.0 g/dL   RDW 16.1 (H) 11.5 - 15.5 %   Platelets 219 150 - 400 K/uL    Comment: REPEATED TO VERIFY   nRBC 0.0 0.0 - 0.2 %   Neutrophils Relative % 82 %   Neutro Abs 8.0 (H) 1.7 - 7.7 K/uL   Lymphocytes Relative 9 %   Lymphs Abs 0.8 0.7 - 4.0 K/uL   Monocytes Relative 8 %   Monocytes Absolute 0.7 0.1 - 1.0 K/uL   Eosinophils Relative 0 %   Eosinophils Absolute 0.0 0.0 - 0.5  K/uL   Basophils Relative 0 %   Basophils Absolute 0.0 0.0 - 0.1 K/uL   Immature Granulocytes 1 %   Abs Immature Granulocytes 0.05 0.00 - 0.07 K/uL    Comment: Performed at [facility], [address], [place], [place] [address]  Comprehensive metabolic panel     Status: Abnormal   Collection Time: 06/23/21  7:00 AM  Result Value Ref Range   Sodium 140 135 - 145 mmol/L   Potassium 4.0 3.5 - 5.1 mmol/L   Chloride 109 98 - 111 mmol/L   CO2 22 22 - 32 mmol/L   Glucose, Bld 111 (H) 70 - 99 mg/dL    Comment: Glucose reference range applies only to samples taken after fasting for at least 8 hours.   BUN 18 8 - 23 mg/dL   Creatinine, Ser 0.87 0.61 - 1.24 mg/dL   Calcium 8.7 (L) 8.9 - 10.3 mg/dL   Total Protein 5.4 (L) 6.5 - 8.1 g/dL   Albumin 3.2 (L) 3.5 - 5.0 g/dL   AST 13 (L) 15 - 41 U/L   ALT 14 0 - 44 U/L   Alkaline Phosphatase 26 (L) 38 - 126 U/L   Total Bilirubin 2.3 (H) 0.3 - 1.2 mg/dL   GFR, Estimated >60 >60 mL/min    Comment: (NOTE) Calculated using the CKD-EPI Creatinine Equation (2021)    Anion gap 9 5 - 15    Comment: Performed at [facility], [place] [address] [place], [place] [address]  Magnesium     Status: None   Collection Time: 06/23/21  7:00 AM  Result Value Ref Range   Magnesium 2.0 1.7 - 2.4 mg/dL    Comment: Performed at [facility], [place] [address] [place], [place] [address]  Phosphorus     Status: None   Collection Time: 06/23/21  7:00 AM  Result Value Ref Range   Phosphorus 4.2 2.5 - 4.6 mg/dL    Comment: Performed at [facility], [place] [address] [place], [place] [address]  Hemoglobin and hematocrit, blood     Status: Abnormal   Collection Time: 06/23/21  9:11 AM  Result Value Ref Range   Hemoglobin 8.5 (L) 13.0 - 17.0 g/dL   HCT 26.4 (L) 39.0 - 52.0 %    Comment: Performed at [facility], [place] [address] [place], [place] [address]   CT ANGIO GI BLEED  Result Date: 06/23/2021 CLINICAL DATA:  Upper GI bleed. EXAM: CTA  ABDOMEN AND PELVIS WITHOUT AND WITH CONTRAST TECHNIQUE: Multidetector CT imaging of the abdomen and pelvis was performed using the standard protocol during bolus administration of intravenous contrast. Multiplanar reconstructed images and MIPs were obtained and reviewed to evaluate the vascular anatomy. RADIATION DOSE REDUCTION: This exam was performed according to the  departmental dose-optimization program which includes automated exposure control, adjustment of the mA and/or kV according to patient size and/or use of iterative reconstruction technique. CONTRAST:  141m OMNIPAQUE IOHEXOL 350 MG/ML SOLN COMPARISON:  None Available. FINDINGS: VASCULAR Aorta: Moderate atherosclerotic calcification of the abdominal aorta. No aneurysmal dilatation or dissection. No periaortic fluid collection. Celiac: Patent without evidence of aneurysm, dissection, vasculitis or significant stenosis. SMA: Patent without evidence of aneurysm, dissection, vasculitis or significant stenosis. Renals: Both renal arteries are patent without evidence of aneurysm, dissection, vasculitis, fibromuscular dysplasia or significant stenosis. IMA: Patent without evidence of aneurysm, dissection, vasculitis or significant stenosis. Inflow: Moderate atherosclerotic calcification of the iliac arteries. No aneurysmal dilatation or dissection. The iliac arteries are patent. Proximal Outflow: Bilateral common femoral and visualized portions of the superficial and profunda femoral arteries are patent without evidence of aneurysm, dissection, vasculitis or significant stenosis. Veins: The IVC is unremarkable. The SMV, splenic vein, and main portal vein are patent. No portal venous gas. Review of the MIP images confirms the above findings. NON-VASCULAR Lower chest: The visualized lung bases are clear. No intra-abdominal free air or free fluid. Hepatobiliary: No focal liver abnormality is seen. No gallstones, gallbladder wall thickening, or biliary dilatation.  Pancreas: Unremarkable. No pancreatic ductal dilatation or surrounding inflammatory changes. Spleen: Normal in size without focal abnormality. Adrenals/Urinary Tract: The adrenal glands are unremarkable. There is no hydronephrosis or nephrolithiasis on either side. The kidneys, visualized ureters, and urinary bladder appear unremarkable. Stomach/Bowel: No extravasated contrast or evidence of active GI bleed. There is focal area of the inflammatory changes and thickening of the wall of the stomach involving the lesser curvature which may represent gastritis or related to underlying gastric ulcer. However, an infiltrative process is not excluded. There is apparent focal area of discontinuity of the wall of the stomach (17/7), possibly ulcer. A small perforation is not excluded. A punctate focus of air along the wall of the lesser curvature of the stomach (30/6, coronal 24/13, sagittal the 76/14) may represent air in gastric ulcer or possibly focally contained microperforation. Correlation with endoscopy findings is recommended. There is no bowel obstruction. Several scattered colonic diverticula without active inflammatory changes. Appendectomy. Lymphatic: No adenopathy. Reproductive: Prostatectomy. Other: None Musculoskeletal: Osteopenia degenerative changes of the spine. No acute osseous pathology. IMPRESSION: 1. No evidence of active GI bleed. 2. Focal area of inflammatory changes and thickening of the wall of the stomach involving the lesser curvature, possibly gastritis or related to underlying gastric ulcer. However, an infiltrative process is not excluded. A punctate focus of air along the wall of the lesser curvature of the stomach may represent air in gastric ulcer or possibly focally contained microperforation. Correlation with endoscopy findings is recommended. 3. Colonic diverticulosis without active inflammatory changes. 4. Aortic Atherosclerosis (ICD10-I70.0). Electronically Signed   By: AAnner Crete M.D.   On: 06/23/2021 03:26    Pending Labs Unresulted Labs (From admission, onward)     Start     Ordered   06/24/21 01610 Basic metabolic panel  Tomorrow morning,   R        06/23/21 1034   06/23/21 1034  CBC  5A & 5P,   R      06/23/21 1033            Vitals/Pain Today's Vitals   06/23/21 0516 06/23/21 0630 06/23/21 0930 06/23/21 1032  BP: 138/88 126/89 120/82   Pulse: 79 71 80   Resp: '16 15 19   '$ Temp:  TempSrc:      SpO2: 99% 99% 98%   Weight:      Height:      PainSc:    0-No pain    Isolation Precautions No active isolations  Medications Medications  0.9 %  sodium chloride infusion (10 mL/hr Intravenous Not Given 06/23/21 0150)  pantoprozole (PROTONIX) 80 mg /NS 100 mL infusion (8 mg/hr Intravenous New Bag/Given 06/23/21 0304)  pantoprazole (PROTONIX) injection 40 mg (has no administration in time range)  atorvastatin (LIPITOR) tablet 80 mg (80 mg Oral Given 06/23/21 1033)  acetaminophen (TYLENOL) tablet 650 mg (has no administration in time range)  melatonin tablet 5 mg (has no administration in time range)  prochlorperazine (COMPAZINE) injection 10 mg (has no administration in time range)  polyethylene glycol (MIRALAX / GLYCOLAX) packet 17 g (has no administration in time range)  lactated ringers infusion ( Intravenous New Bag/Given 06/23/21 0519)  pantoprazole (PROTONIX) injection 40 mg (40 mg Intravenous Given 06/23/21 0029)  pantoprazole (PROTONIX) 80 mg /NS 100 mL IVPB (0 mg Intravenous Stopped 06/23/21 0510)  iohexol (OMNIPAQUE) 350 MG/ML injection 100 mL (100 mLs Intravenous Contrast Given 06/23/21 0308)    Mobility walks Low fall risk   Focused Assessments GI- no pain or bleeding at this time   R Recommendations: See Admitting Provider Note  Report given to:   Additional Notes:

## 2021-06-23 NOTE — Progress Notes (Signed)
Pt admitted to rm 23 from endo. CHG wipe given. Initiated tele. Oriented pt to the unit. VSS. Call bell within reach.   Lavenia Atlas, RN

## 2021-06-23 NOTE — Anesthesia Preprocedure Evaluation (Addendum)
Anesthesia Evaluation  Patient identified by MRN, date of birth, ID band Patient awake    Reviewed: Allergy & Precautions, H&P , NPO status , Patient's Chart, lab work & pertinent test results, reviewed documented beta blocker date and time   Airway Mallampati: II  TM Distance: >3 FB Neck ROM: full    Dental  (+) Partial Lower, Partial Upper, Dental Advisory Given   Pulmonary former smoker,    breath sounds clear to auscultation       Cardiovascular hypertension, Pt. on medications and Pt. on home beta blockers + CAD, + Past MI and + Cardiac Stents   Rhythm:regular Rate:Normal     Neuro/Psych Seizures -,  PSYCHIATRIC DISORDERS Depression    GI/Hepatic   Endo/Other    Renal/GU      Musculoskeletal  (+) Arthritis ,   Abdominal   Peds  Hematology  (+) Blood dyscrasia, anemia ,   Anesthesia Other Findings   Reproductive/Obstetrics                            Anesthesia Physical Anesthesia Plan  ASA: 3  Anesthesia Plan: MAC   Post-op Pain Management:    Induction: Intravenous  PONV Risk Score and Plan: 1 and Propofol infusion and Treatment may vary due to age or medical condition  Airway Management Planned: Nasal Cannula  Additional Equipment:   Intra-op Plan:   Post-operative Plan:   Informed Consent: I have reviewed the patients History and Physical, chart, labs and discussed the procedure including the risks, benefits and alternatives for the proposed anesthesia with the patient or authorized representative who has indicated his/her understanding and acceptance.     Dental advisory given  Plan Discussed with: CRNA, Anesthesiologist and Surgeon  Anesthesia Plan Comments:         Anesthesia Quick Evaluation

## 2021-06-23 NOTE — Progress Notes (Addendum)
PROGRESS NOTE        PATIENT DETAILS Name: Raymond [name] Age: 61 y.o. Sex: male Date of Birth: Jul 09, 1960 Admit Date: 06/22/2021 Admitting Physician Kayleen Memos, DO XQJ:JHERD, Talmage Coin, DO  Brief Summary: Patient is a 61 y.o.  male history of out of hospital cardiac arrest due to STEMI-requiring PCI to circumflex on 06/14/2019 (still on ASA/Brilinta), prostate cancer s/p radiation therapy-presenting with upper GI bleeding (melena x3-4 days, hematemesis/hematochezia x1 day)-found to have acute blood loss anemia with a hemoglobin of 6.2-started on PPI/transfused 2 units of PRBC and admitted to [facility] service.  Significant events: 6/14>> admit with UGI and acute blood loss anemia  Significant studies: 6/15>> CTA GI bleed: No active GI bleed, focal area of inflammatory changes in the lesser curvature of the stomach-likely gastritis or gastric ulcer.  Punctate focus of air along the wall of the lesser curvature of the stomach may represent a in the gastric ulcer or contained microperforation.  Significant microbiology data:   Procedures:   Consults: GI  Subjective: Lying comfortably in bed-no chest pain/no abdominal pain.  No further episodes of hematemesis/hematochezia overnight.  Objective: Vitals: Blood pressure 120/82, pulse 80, temperature 98.3 F (36.8 C), temperature source Oral, resp. rate 19, height '5\' 11"'$  (1.803 m), weight 84.4 kg, SpO2 98 %.   Exam: Gen Exam:Alert awake-not in any distress HEENT:atraumatic, normocephalic Chest: B/L clear to auscultation anteriorly CVS:S1S2 regular Abdomen:soft non tender, non distended Extremities:no edema Neurology: Non focal Skin: no rash  Pertinent Labs/Radiology:    Latest Ref Rng & Units 06/23/2021    9:11 AM 06/23/2021    7:00 AM 06/22/2021   10:51 PM  CBC  WBC 4.0 - 10.5 K/uL  9.6  8.1   Hemoglobin 13.0 - 17.0 g/dL 8.5  9.1  6.2   Hematocrit 39.0 - 52.0 % 26.4  28.0  20.7   Platelets 150 - 400  K/uL  219  244     Lab Results  Component Value Date   NA 140 06/23/2021   K 4.0 06/23/2021   CL 109 06/23/2021   CO2 22 06/23/2021      Assessment/Plan: Upper GI bleeding with acute blood loss anemia: No further bleeding overnight-continue PPI-GI following-tentatively scheduled for EGD later today.  CTA abdomen negative for GI bleeding-but suggestive of a gastric ulcer.  Although CT abdomen raising question of microperforation-he has no abdominal pain-and overall clinical situation is not consistent with perforated gastric ulcer.  In any event-continue n.p.o. status-await further recommendations from gastroenterology.  Follow CBC and transfuse accordingly.  CAD: No current chest pain or shortness of breath.  He apparently was having some intermittent chest pain over the past few days that was nonexertional-and could have been related to anemia or underlying gastric ulcer/PUD.  Watch closely-keep Hb> 8-on PPI.  Aspirin/Brilinta currently on hold due to active GI bleeding-Will need to touch base with cardiology prior to discharge to see if he transition to monotherapy with antiplatelet agents.  Chronic HFpEF: Volume status stable  HTN: BP stable-continue to hold all antihypertensives given ongoing GI bleeding.  HLD: Continue statin  History of prostate cancer: Resume follow-up with his outpatient physicians postdischarge BMI: Estimated body mass index is 25.94 kg/m as calculated from the following:   Height as of this encounter: '5\' 11"'$  (1.803 m).   Weight as of this encounter: 84.4 kg.  Code status:   Code Status: Prior   DVT Prophylaxis:SCD's  Family Communication: Daughter was on speaker phone with the patient while I was rounding on the patient.  Disposition Plan: Status is: Inpatient Remains inpatient appropriate because: Upper GI bleeding with acute blood loss anemia-needs further work-up.  Not yet stable for discharge-probably requires another 2-3 days of  hospitalization.   Planned Discharge Destination:Home   Diet: Diet Order             Diet NPO time specified Except for: Sips with Meds  Diet effective now                     Antimicrobial agents: Anti-infectives (From admission, onward)    None        MEDICATIONS: Scheduled Meds:  atorvastatin  80 mg Oral Daily   [START ON 06/26/2021] pantoprazole  40 mg Intravenous Q12H   Continuous Infusions:  sodium chloride     lactated ringers 50 mL/hr at 06/23/21 0519   pantoprazole 8 mg/hr (06/23/21 0304)   PRN Meds:.acetaminophen, melatonin, polyethylene glycol, prochlorperazine   I have personally reviewed following labs and imaging studies  LABORATORY DATA: CBC: Recent Labs  Lab 06/22/21 2251 06/23/21 0700 06/23/21 0911  WBC 8.1 9.6  --   NEUTROABS 6.2 8.0*  --   HGB 6.2* 9.1* 8.5*  HCT 20.7* 28.0* 26.4*  MCV 79.9* 79.3*  --   PLT 244 219  --     Basic Metabolic Panel: Recent Labs  Lab 06/22/21 2251 06/23/21 0700  NA 143 140  K 3.6 4.0  CL 113* 109  CO2 21* 22  GLUCOSE 169* 111*  BUN 24* 18  CREATININE 1.24 0.87  CALCIUM 8.7* 8.7*  MG  --  2.0  PHOS  --  4.2    GFR: Estimated Creatinine Clearance: 95 mL/min (by C-G formula based on SCr of 0.87 mg/dL).  Liver Function Tests: Recent Labs  Lab 06/22/21 2251 06/23/21 0700  AST 19 13*  ALT 15 14  ALKPHOS 24* 26*  BILITOT 0.6 2.3*  PROT 5.0* 5.4*  ALBUMIN 2.9* 3.2*   Recent Labs  Lab 06/22/21 2251  LIPASE 33   No results for input(s): "AMMONIA" in the last 168 hours.  Coagulation Profile: Recent Labs  Lab 06/22/21 2251  INR 1.1    Cardiac Enzymes: No results for input(s): "CKTOTAL", "CKMB", "CKMBINDEX", "TROPONINI" in the last 168 hours.  BNP (last 3 results) No results for input(s): "PROBNP" in the last 8760 hours.  Lipid Profile: No results for input(s): "CHOL", "HDL", "LDLCALC", "TRIG", "CHOLHDL", "LDLDIRECT" in the last 72 hours.  Thyroid Function Tests: No  results for input(s): "TSH", "T4TOTAL", "FREET4", "T3FREE", "THYROIDAB" in the last 72 hours.  Anemia Panel: No results for input(s): "VITAMINB12", "FOLATE", "FERRITIN", "TIBC", "IRON", "RETICCTPCT" in the last 72 hours.  Urine analysis:    Component Value Date/Time   COLORURINE YELLOW 06/14/2019 1955   APPEARANCEUR CLOUDY (A) 06/14/2019 1955   LABSPEC 1.011 06/14/2019 1955   PHURINE 8.0 06/14/2019 1955   GLUCOSEU >=500 (A) 06/14/2019 1955   HGBUR MODERATE (A) 06/14/2019 1955   HGBUR trace-intact 02/12/2009 0936   BILIRUBINUR NEGATIVE 06/14/2019 1955   KETONESUR NEGATIVE 06/14/2019 1955   PROTEINUR >=300 (A) 06/14/2019 1955   UROBILINOGEN 0.2 05/25/2011 0936   NITRITE NEGATIVE 06/14/2019 1955   LEUKOCYTESUR NEGATIVE 06/14/2019 1955    Sepsis Labs: Lactic Acid, Venous    Component Value Date/Time   LATICACIDVEN 1.8 06/15/2019 0108  MICROBIOLOGY: No results found for this or any previous visit (from the past 240 hour(s)).  RADIOLOGY STUDIES/RESULTS: CT ANGIO GI BLEED  Result Date: 06/23/2021 CLINICAL DATA:  Upper GI bleed. EXAM: CTA ABDOMEN AND PELVIS WITHOUT AND WITH CONTRAST TECHNIQUE: Multidetector CT imaging of the abdomen and pelvis was performed using the standard protocol during bolus administration of intravenous contrast. Multiplanar reconstructed images and MIPs were obtained and reviewed to evaluate the vascular anatomy. RADIATION DOSE REDUCTION: This exam was performed according to the departmental dose-optimization program which includes automated exposure control, adjustment of the mA and/or kV according to patient size and/or use of iterative reconstruction technique. CONTRAST:  130m OMNIPAQUE IOHEXOL 350 MG/ML SOLN COMPARISON:  None Available. FINDINGS: VASCULAR Aorta: Moderate atherosclerotic calcification of the abdominal aorta. No aneurysmal dilatation or dissection. No periaortic fluid collection. Celiac: Patent without evidence of aneurysm, dissection,  vasculitis or significant stenosis. SMA: Patent without evidence of aneurysm, dissection, vasculitis or significant stenosis. Renals: Both renal arteries are patent without evidence of aneurysm, dissection, vasculitis, fibromuscular dysplasia or significant stenosis. IMA: Patent without evidence of aneurysm, dissection, vasculitis or significant stenosis. Inflow: Moderate atherosclerotic calcification of the iliac arteries. No aneurysmal dilatation or dissection. The iliac arteries are patent. Proximal Outflow: Bilateral common femoral and visualized portions of the superficial and profunda femoral arteries are patent without evidence of aneurysm, dissection, vasculitis or significant stenosis. Veins: The IVC is unremarkable. The SMV, splenic vein, and main portal vein are patent. No portal venous gas. Review of the MIP images confirms the above findings. NON-VASCULAR Lower chest: The visualized lung bases are clear. No intra-abdominal free air or free fluid. Hepatobiliary: No focal liver abnormality is seen. No gallstones, gallbladder wall thickening, or biliary dilatation. Pancreas: Unremarkable. No pancreatic ductal dilatation or surrounding inflammatory changes. Spleen: Normal in size without focal abnormality. Adrenals/Urinary Tract: The adrenal glands are unremarkable. There is no hydronephrosis or nephrolithiasis on either side. The kidneys, visualized ureters, and urinary bladder appear unremarkable. Stomach/Bowel: No extravasated contrast or evidence of active GI bleed. There is focal area of the inflammatory changes and thickening of the wall of the stomach involving the lesser curvature which may represent gastritis or related to underlying gastric ulcer. However, an infiltrative process is not excluded. There is apparent focal area of discontinuity of the wall of the stomach (17/7), possibly ulcer. A small perforation is not excluded. A punctate focus of air along the wall of the lesser curvature of the  stomach (30/6, coronal 24/13, sagittal the 76/14) may represent air in gastric ulcer or possibly focally contained microperforation. Correlation with endoscopy findings is recommended. There is no bowel obstruction. Several scattered colonic diverticula without active inflammatory changes. Appendectomy. Lymphatic: No adenopathy. Reproductive: Prostatectomy. Other: None Musculoskeletal: Osteopenia degenerative changes of the spine. No acute osseous pathology. IMPRESSION: 1. No evidence of active GI bleed. 2. Focal area of inflammatory changes and thickening of the wall of the stomach involving the lesser curvature, possibly gastritis or related to underlying gastric ulcer. However, an infiltrative process is not excluded. A punctate focus of air along the wall of the lesser curvature of the stomach may represent air in gastric ulcer or possibly focally contained microperforation. Correlation with endoscopy findings is recommended. 3. Colonic diverticulosis without active inflammatory changes. 4. Aortic Atherosclerosis (ICD10-I70.0). Electronically Signed   By: AAnner CreteM.D.   On: 06/23/2021 03:26     LOS: 0 days   SOren Binet MD  [facility]    To contact the attending provider between 7A-7P  or the covering provider during after hours 7P-7A, please log into the web site [email or web address] and access using universal [place] password for that web site. If you do not have the password, please call the hospital operator.  06/23/2021, 10:17 AM

## 2021-06-23 NOTE — Progress Notes (Signed)
Echocardiogram 2D Echocardiogram has been performed.  [name] RDCS 06/23/2021, 12:13 PM

## 2021-06-23 NOTE — ED Notes (Signed)
Pt transported to ENDO 

## 2021-06-24 DIAGNOSIS — K254 Chronic or unspecified gastric ulcer with hemorrhage: Secondary | ICD-10-CM | POA: Diagnosis not present

## 2021-06-24 DIAGNOSIS — K922 Gastrointestinal hemorrhage, unspecified: Secondary | ICD-10-CM | POA: Diagnosis not present

## 2021-06-24 LAB — CBC
HCT: 22.8 % — ABNORMAL LOW (ref 39.0–52.0)
HCT: 26 % — ABNORMAL LOW (ref 39.0–52.0)
Hemoglobin: 7.3 g/dL — ABNORMAL LOW (ref 13.0–17.0)
Hemoglobin: 8.6 g/dL — ABNORMAL LOW (ref 13.0–17.0)
MCH: 25.3 pg — ABNORMAL LOW (ref 26.0–34.0)
MCH: 26.6 pg (ref 26.0–34.0)
MCHC: 32 g/dL (ref 30.0–36.0)
MCHC: 33.1 g/dL (ref 30.0–36.0)
MCV: 79.2 fL — ABNORMAL LOW (ref 80.0–100.0)
MCV: 80.5 fL (ref 80.0–100.0)
Platelets: 202 10*3/uL (ref 150–400)
Platelets: 194 10*3/uL (ref 150–400)
RBC: 2.88 MIL/uL — ABNORMAL LOW (ref 4.22–5.81)
RBC: 3.23 MIL/uL — ABNORMAL LOW (ref 4.22–5.81)
RDW: 16.9 % — ABNORMAL HIGH (ref 11.5–15.5)
RDW: 16.9 % — ABNORMAL HIGH (ref 11.5–15.5)
WBC: 6.3 10*3/uL (ref 4.0–10.5)
WBC: 7.7 10*3/uL (ref 4.0–10.5)
nRBC: 0 % (ref 0.0–0.2)
nRBC: 0 % (ref 0.0–0.2)

## 2021-06-24 LAB — BASIC METABOLIC PANEL
Anion gap: 5 (ref 5–15)
BUN: 13 mg/dL (ref 8–23)
CO2: 24 mmol/L (ref 22–32)
Calcium: 8.3 mg/dL — ABNORMAL LOW (ref 8.9–10.3)
Chloride: 111 mmol/L (ref 98–111)
Creatinine, Ser: 1.06 mg/dL (ref 0.61–1.24)
GFR, Estimated: >60 mL/min (ref >60)
Glucose, Bld: 94 mg/dL (ref 70–99)
Potassium: 3.6 mmol/L (ref 3.5–5.1)
Sodium: 140 mmol/L (ref 135–145)

## 2021-06-24 LAB — HEMOGLOBIN AND HEMATOCRIT, BLOOD
HCT: 26.8 % — ABNORMAL LOW (ref 39.0–52.0)
Hemoglobin: 8.9 g/dL — ABNORMAL LOW (ref 13.0–17.0)

## 2021-06-24 LAB — PREPARE RBC (CROSSMATCH)

## 2021-06-24 MED ORDER — SODIUM CHLORIDE 0.9% IV SOLUTION: Freq: Once | INTRAVENOUS | Status: DC

## 2021-06-24 NOTE — Progress Notes (Signed)
PROGRESS NOTE        PATIENT DETAILS Name: Raymond [name] Age: 61 y.o. Sex: male Date of Birth: [birth year] Admit Date: 06/22/2021 Admitting Physician Kayleen Memos, DO QRF:XJOIT, Talmage Coin, DO  Brief Summary: Patient is a 61 y.o.  male history of out of hospital cardiac arrest due to STEMI-requiring PCI to circumflex on 06/14/2019 (still on ASA/Brilinta), prostate cancer s/p radiation therapy-presenting with upper GI bleeding (melena x3-4 days, hematemesis/hematochezia x1 day)-found to have acute blood loss anemia with a hemoglobin of 6.2-started on PPI/transfused 2 units of PRBC and admitted to [facility] service.  Significant events: 6/14>> admit with UGI and acute blood loss anemia  Significant studies: 6/15>> CTA GI bleed: No active GI bleed, focal area of inflammatory changes in the lesser curvature of the stomach-likely gastritis or gastric ulcer.  Punctate focus of air along the wall of the lesser curvature of the stomach may represent a in the gastric ulcer or contained microperforation.  6/15>>  EGD -  Impression:               - Non-bleeding gastric ulcer with adherent clot that is source of hemorrhage. Large deep ulcer with                            adherent clot. Given recent Brilinta, size and shape I decided not to intervene with clips or                          thermal therapy. High risk more bleeding.                           - Gastritis. Biopsied. R/O H pylori                           - LA Grade A reflux esophagitis with no bleeding.                            Single erosion/ulcer                           - The examination was otherwise normal. Recommendation:           - Return patient to hospital ward for ongoing care. He should be in stepdown or progressive care. If he                            shows signs of more bleeding would give platelets  to overcome Brilinta effects and we could rescope                            and consider hemospray or  other therapy vs IR and embolization. Surgical consultation could be needed                            also.                           -  NPO and continuous PPI infusion                           - f/u H pylori testing                           - rescope at some point to document healing (2 +                            months) but this will also depend upon clinical                            course                           - serial Hgb                           - will follow                           - needs cardiology consult re: follow-up and                            longer-term treatment plans though can wait on that                            right now  Significant microbiology data:   Procedures:   Consults: GI  Subjective:  Patient in bed, appears comfortable, denies any headache, no fever, no chest pain or pressure, no shortness of breath , no abdominal pain. No new focal weakness.  No further blood in stool or black stool, no nausea.  Objective: Vitals: Blood pressure 137/82, pulse 70, temperature 98.2 F (36.8 C), temperature source Oral, resp. rate 17, height '5\' 11"'$  (1.803 m), weight 84.4 kg, SpO2 99 %.   Exam:  Awake Alert, No new F.N deficits, Normal affect [place].AT,PERRAL Supple Neck, No JVD,   Symmetrical Chest wall movement, Good air movement bilaterally, CTAB RRR,No Gallops, Rubs or new Murmurs,  +ve B.Sounds, Abd Soft, No tenderness,   No Cyanosis, Clubbing or edema    Assessment/Plan:  Upper GI bleeding with acute blood loss anemia: It is post 2 units of packed RBC transfusion on 06/23/2021 getting 1/3 unit assay 16th 2023, CT abdomen pelvis and EGD report noted consistent with recently bled gastric ulcer, continue IV PPI, clear liquids, case discussed with GI.  Monitor for another 24 to 48 hours closely.  If hemoglobin drops below 7.5 we will transfuse 1 unit of packed RBC along with platelets, if bleeding reoccurs he may require IR versus general surgery  input.  CAD: S/p PCI with stent placement 2 years ago, case discussed with cardiology, he was on DAPT, per cardiology can now be transition to aspirin only with outpatient cardiology follow-up postdischarge.  Stop Plavix upon discharge.  Chronic HFpEF: Volume status stable  HTN: BP stable-continue to hold all antihypertensives given ongoing GI bleeding.  HLD: Continue statin  History of prostate cancer: Resume follow-up with his outpatient physicians postdischarge BMI: Estimated body mass index is 25.94 kg/m as calculated from the following:   Height as of this  encounter: '5\' 11"'$  (1.803 m).   Weight as of this encounter: 84.4 kg.   Code status:   Code Status: Prior   DVT Prophylaxis:SCD's Place and maintain sequential compression device Start: 06/23/21 1639 Family Communication: Daughter was on speaker phone with the patient while I was rounding on the patient.  Disposition Plan: Status is: Inpatient Remains inpatient appropriate because: Upper GI bleeding with acute blood loss anemia-needs further work-up.  Not yet stable for discharge-probably requires another 2-3 days of hospitalization.   Planned Discharge Destination:Home   Diet: Diet Order             Diet clear liquid Room service appropriate? Yes; Fluid consistency: Thin  Diet effective now                   Antimicrobial agents: Anti-infectives (From admission, onward)    None      MEDICATIONS: Scheduled Meds:  sodium chloride   Intravenous Once   atorvastatin  80 mg Oral Daily   [START ON 06/26/2021] pantoprazole  40 mg Intravenous Q12H   Continuous Infusions:  sodium chloride     pantoprazole 8 mg/hr (06/23/21 2247)   PRN Meds:.acetaminophen, melatonin, polyethylene glycol, prochlorperazine   I have personally reviewed following labs and imaging studies  LABORATORY DATA:  Recent Labs  Lab 06/22/21 2251 06/23/21 0700 06/23/21 0911 06/23/21 1847 06/24/21 0436 06/24/21 1120  WBC 8.1 9.6   --  7.9 6.3  --   HGB 6.2* 9.1* 8.5* 8.1* 7.3* 8.9*  HCT 20.7* 28.0* 26.4* 24.8* 22.8* 26.8*  PLT 244 219  --  211 202  --   MCV 79.9* 79.3*  --  79.0* 79.2*  --   MCH 23.9* 25.8*  --  25.8* 25.3*  --   MCHC 30.0 32.5  --  32.7 32.0  --   RDW 17.6* 16.1*  --  16.6* 16.9*  --   LYMPHSABS 1.0 0.8  --   --   --   --   MONOABS 0.7 0.7  --   --   --   --   EOSABS 0.1 0.0  --   --   --   --   BASOSABS 0.0 0.0  --   --   --   --     Recent Labs  Lab 06/22/21 2251 06/23/21 0700 06/24/21 0436  NA 143 140 140  K 3.6 4.0 3.6  CL 113* 109 111  CO2 21* 22 24  GLUCOSE 169* 111* 94  BUN 24* 18 13  CREATININE 1.24 0.87 1.06  CALCIUM 8.7* 8.7* 8.3*  AST 19 13*  --   ALT 15 14  --   ALKPHOS 24* 26*  --   BILITOT 0.6 2.3*  --   ALBUMIN 2.9* 3.2*  --   MG  --  2.0  --   PHOS  --  4.2  --   INR 1.1  --   --         MICROBIOLOGY: No results found for this or any previous visit (from the past 240 hour(s)).  RADIOLOGY STUDIES/RESULTS: ECHOCARDIOGRAM COMPLETE  Result Date: 06/23/2021    ECHOCARDIOGRAM REPORT   Patient Name:   Raymond [name] Date of Exam: 06/23/2021 Medical Rec #:  [id number]     Height:       71.0 in Accession #:    [id number]    Weight:       186.0 lb Date of Birth:  [birth year]  BSA:          2.045 m Patient Age:    18 years      BP:           120/82 mmHg Patient Gender: M             HR:           69 bpm. Exam Location:  Inpatient Procedure: 2D Echo, Color Doppler and Cardiac Doppler Indications:    R94.31 Abnormal EKG  History:        Patient has prior history of Echocardiogram examinations, most                 recent 06/15/2019. Risk Factors:Hypertension and Polysubstance                 Abuse.  Sonographer:    Raquel Sarna Senior RDCS Referring Phys: [id number] [place]  1. Left ventricular ejection fraction, by estimation, is 45 to 50%. The left ventricle has mildly decreased function. The left ventricle has no regional wall motion abnormalities. There is mild  concentric left ventricular hypertrophy. Left ventricular diastolic parameters are consistent with Grade I diastolic dysfunction (impaired relaxation).  2. Right ventricular systolic function is normal. The right ventricular size is normal.  3. The mitral valve is normal in structure. Mild mitral valve regurgitation. No evidence of mitral stenosis.  4. The aortic valve is normal in structure. Aortic valve regurgitation is not visualized. No aortic stenosis is present.  5. There is borderline dilatation of the aortic root, measuring 38 mm. There is borderline dilatation of the ascending aorta, measuring 37 mm.  6. The inferior vena cava is normal in size with greater than 50% respiratory variability, suggesting right atrial pressure of 3 mmHg. Comparison(s): A prior study was performed on 06/15/2019. The left ventricular function is worsened. FINDINGS  Left Ventricle: Left ventricular ejection fraction, by estimation, is 45 to 50%. The left ventricle has mildly decreased function. The left ventricle has no regional wall motion abnormalities. The left ventricular internal cavity size was normal in size. There is mild concentric left ventricular hypertrophy. Left ventricular diastolic parameters are consistent with Grade I diastolic dysfunction (impaired relaxation). Right Ventricle: The right ventricular size is normal. No increase in right ventricular wall thickness. Right ventricular systolic function is normal. Left Atrium: Left atrial size was normal in size. Right Atrium: Right atrial size was normal in size. Pericardium: There is no evidence of pericardial effusion. Mitral Valve: The mitral valve is normal in structure. Mild mitral valve regurgitation. No evidence of mitral valve stenosis. Tricuspid Valve: The tricuspid valve is normal in structure. Tricuspid valve regurgitation is not demonstrated. No evidence of tricuspid stenosis. Aortic Valve: The aortic valve is normal in structure. Aortic valve regurgitation  is not visualized. No aortic stenosis is present. Pulmonic Valve: The pulmonic valve was normal in structure. Pulmonic valve regurgitation is trivial. No evidence of pulmonic stenosis. Aorta: The aortic root is normal in size and structure. There is borderline dilatation of the aortic root, measuring 38 mm. There is borderline dilatation of the ascending aorta, measuring 37 mm. Venous: The inferior vena cava is normal in size with greater than 50% respiratory variability, suggesting right atrial pressure of 3 mmHg. IAS/Shunts: No atrial level shunt detected by color flow Doppler.  LEFT VENTRICLE PLAX 2D LVIDd:         5.60 cm      Diastology LVIDs:         4.10 cm  LV e' medial:    4.90 cm/s LV PW:         1.20 cm      LV E/e' medial:  10.7 LV IVS:        1.10 cm      LV e' lateral:   5.98 cm/s LVOT diam:     2.20 cm      LV E/e' lateral: 8.7 LV SV:         68 LV SV Index:   33 LVOT Area:     3.80 cm  LV Volumes (MOD) LV vol d, MOD A2C: 115.0 ml LV vol d, MOD A4C: 121.0 ml LV vol s, MOD A2C: 58.3 ml LV vol s, MOD A4C: 67.7 ml LV SV MOD A2C:     56.7 ml LV SV MOD A4C:     121.0 ml LV SV MOD BP:      57.6 ml RIGHT VENTRICLE RV S prime:     14.00 cm/s TAPSE (M-mode): 2.3 cm LEFT ATRIUM             Index        RIGHT ATRIUM           Index LA diam:        2.70 cm 1.32 cm/m   RA Area:     18.80 cm LA Vol (A2C):   44.9 ml 21.96 ml/m  RA Volume:   46.90 ml  22.94 ml/m LA Vol (A4C):   36.5 ml 17.85 ml/m LA Biplane Vol: 40.2 ml 19.66 ml/m  AORTIC VALVE LVOT Vmax:   97.30 cm/s LVOT Vmean:  62.800 cm/s LVOT VTI:    0.179 m  AORTA Ao Root diam: 3.80 cm Ao Asc diam:  3.70 cm MITRAL VALVE MV Area (PHT): 2.05 cm    SHUNTS MV Decel Time: 370 msec    Systemic VTI:  0.18 m MV E velocity: 52.30 cm/s  Systemic Diam: 2.20 cm MV A velocity: 73.30 cm/s MV E/A ratio:  0.71 Kardie Tobb DO Electronically signed by Berniece Salines DO Signature Date/Time: 06/23/2021/12:32:35 PM    Final    CT ANGIO GI BLEED  Result Date:  06/23/2021 CLINICAL DATA:  Upper GI bleed. EXAM: CTA ABDOMEN AND PELVIS WITHOUT AND WITH CONTRAST TECHNIQUE: Multidetector CT imaging of the abdomen and pelvis was performed using the standard protocol during bolus administration of intravenous contrast. Multiplanar reconstructed images and MIPs were obtained and reviewed to evaluate the vascular anatomy. RADIATION DOSE REDUCTION: This exam was performed according to the departmental dose-optimization program which includes automated exposure control, adjustment of the mA and/or kV according to patient size and/or use of iterative reconstruction technique. CONTRAST:  134m OMNIPAQUE IOHEXOL 350 MG/ML SOLN COMPARISON:  None Available. FINDINGS: VASCULAR Aorta: Moderate atherosclerotic calcification of the abdominal aorta. No aneurysmal dilatation or dissection. No periaortic fluid collection. Celiac: Patent without evidence of aneurysm, dissection, vasculitis or significant stenosis. SMA: Patent without evidence of aneurysm, dissection, vasculitis or significant stenosis. Renals: Both renal arteries are patent without evidence of aneurysm, dissection, vasculitis, fibromuscular dysplasia or significant stenosis. IMA: Patent without evidence of aneurysm, dissection, vasculitis or significant stenosis. Inflow: Moderate atherosclerotic calcification of the iliac arteries. No aneurysmal dilatation or dissection. The iliac arteries are patent. Proximal Outflow: Bilateral common femoral and visualized portions of the superficial and profunda femoral arteries are patent without evidence of aneurysm, dissection, vasculitis or significant stenosis. Veins: The IVC is unremarkable. The SMV, splenic vein, and main portal vein are patent. No portal venous  gas. Review of the MIP images confirms the above findings. NON-VASCULAR Lower chest: The visualized lung bases are clear. No intra-abdominal free air or free fluid. Hepatobiliary: No focal liver abnormality is seen. No  gallstones, gallbladder wall thickening, or biliary dilatation. Pancreas: Unremarkable. No pancreatic ductal dilatation or surrounding inflammatory changes. Spleen: Normal in size without focal abnormality. Adrenals/Urinary Tract: The adrenal glands are unremarkable. There is no hydronephrosis or nephrolithiasis on either side. The kidneys, visualized ureters, and urinary bladder appear unremarkable. Stomach/Bowel: No extravasated contrast or evidence of active GI bleed. There is focal area of the inflammatory changes and thickening of the wall of the stomach involving the lesser curvature which may represent gastritis or related to underlying gastric ulcer. However, an infiltrative process is not excluded. There is apparent focal area of discontinuity of the wall of the stomach (17/7), possibly ulcer. A small perforation is not excluded. A punctate focus of air along the wall of the lesser curvature of the stomach (30/6, coronal 24/13, sagittal the 76/14) may represent air in gastric ulcer or possibly focally contained microperforation. Correlation with endoscopy findings is recommended. There is no bowel obstruction. Several scattered colonic diverticula without active inflammatory changes. Appendectomy. Lymphatic: No adenopathy. Reproductive: Prostatectomy. Other: None Musculoskeletal: Osteopenia degenerative changes of the spine. No acute osseous pathology. IMPRESSION: 1. No evidence of active GI bleed. 2. Focal area of inflammatory changes and thickening of the wall of the stomach involving the lesser curvature, possibly gastritis or related to underlying gastric ulcer. However, an infiltrative process is not excluded. A punctate focus of air along the wall of the lesser curvature of the stomach may represent air in gastric ulcer or possibly focally contained microperforation. Correlation with endoscopy findings is recommended. 3. Colonic diverticulosis without active inflammatory changes. 4. Aortic  Atherosclerosis (ICD10-I70.0). Electronically Signed   By: Anner Crete M.D.   On: 06/23/2021 03:26     LOS: 1 day   Signature  Lala Lund M.D on 06/24/2021 at 12:01 PM   -  To page go to [email or web address]

## 2021-06-24 NOTE — Progress Notes (Signed)
   Patient Name: Raymond [name] Date of Encounter: 06/24/2021, 6:06 PM    Subjective  No stools.  Tolerating clear liquids.  Feels much better.  Did get transfused an additional unit of blood.   Objective  BP (!) 147/94 (BP Location: Left Arm)   Pulse 72   Temp 98.4 F (36.9 C) (Oral)   Resp 18   Ht '5\' 11"'$  (1.803 m)   Wt 84.4 kg   SpO2 97%   BMI 25.94 kg/m  No acute distress lying in bed     Latest Ref Rng & Units 06/24/2021    4:31 PM 06/24/2021   11:20 AM 06/24/2021    4:36 AM  CBC  WBC 4.0 - 10.5 K/uL 7.7   6.3   Hemoglobin 13.0 - 17.0 g/dL 8.6  8.9  7.3   Hematocrit 39.0 - 52.0 % 26.0  26.8  22.8   Platelets 150 - 400 K/uL 194   202        Assessment and Plan  Large gastric ulcer with adherent clot.  So far so good without signs of recurrent bleeding.  Recent direct antiplatelet therapy.  Continue medical care with Protonix infusion.  If he is still okay tomorrow without signs of recurrent bleeding we can advance his diet.  I think he should get 72 hours of the continuous PPI infusion given the severity of this ulcer.  Further plans pending this clinical course.  He will definitely need outpatient follow-up and a repeat EGD to document healing in 2 to 3 months.  Should he rebleed we would do it sooner and consider using Hemospray if actively bleeding or perhaps the shape of the ulcer would change to allow other therapy like clips or coagulation therapy.  He is probably getting into the range off DAPT to where we could do that if needed.  [name], MD, [facility] See Shea Evans on call - gastroenterology for best contact person 06/24/2021 6:09 PM   [name], MD, [facility] See Shea Evans on call - gastroenterology for best contact person 06/24/2021 6:06 PM

## 2021-06-24 NOTE — Progress Notes (Signed)
Text paged Sr. Armbruster awaiting return call. Call returned at 21:44 Dr. Havery Moros was made aware of Hemoglobin 8.1 no orders at this time.

## 2021-06-24 NOTE — Plan of Care (Signed)
  Problem: Education: Goal: Knowledge of General Education information will improve Description Including pain rating scale, medication(s)/side effects and non-pharmacologic comfort measures Outcome: Progressing   

## 2021-06-25 DIAGNOSIS — K254 Chronic or unspecified gastric ulcer with hemorrhage: Secondary | ICD-10-CM | POA: Diagnosis not present

## 2021-06-25 DIAGNOSIS — K922 Gastrointestinal hemorrhage, unspecified: Secondary | ICD-10-CM | POA: Diagnosis not present

## 2021-06-25 LAB — CBC
HCT: 26.5 % — ABNORMAL LOW (ref 39.0–52.0)
Hemoglobin: 8.5 g/dL — ABNORMAL LOW (ref 13.0–17.0)
MCH: 26 pg (ref 26.0–34.0)
MCHC: 32.1 g/dL (ref 30.0–36.0)
MCV: 81 fL (ref 80.0–100.0)
Platelets: 212 10*3/uL (ref 150–400)
RBC: 3.27 MIL/uL — ABNORMAL LOW (ref 4.22–5.81)
RDW: 17.4 % — ABNORMAL HIGH (ref 11.5–15.5)
WBC: 6.5 10*3/uL (ref 4.0–10.5)
nRBC: 0 % (ref 0.0–0.2)

## 2021-06-25 LAB — BPAM RBC
Blood Product Expiration Date: 202306222359
Blood Product Expiration Date: 202306222359
Blood Product Expiration Date: 202306252359
ISSUE DATE / TIME: 202306150006
ISSUE DATE / TIME: 202306150209
ISSUE DATE / TIME: 202306160638
Unit Type and Rh: 600
Unit Type and Rh: 600
Unit Type and Rh: 600

## 2021-06-25 LAB — TYPE AND SCREEN
ABO/RH(D): A NEG
Antibody Screen: NEGATIVE
Unit division: 0
Unit division: 0
Unit division: 0

## 2021-06-25 MED ORDER — LISINOPRIL 10 MG PO TABS
10.0000 mg | ORAL_TABLET | Freq: Every day | ORAL | Status: DC
Start: 2021-06-25 — End: 2021-06-26
  Administered 2021-06-25 – 2021-06-26 (×2): 10 mg via ORAL
  Filled 2021-06-25 (×2): qty 1

## 2021-06-25 MED ORDER — POTASSIUM CHLORIDE ER 10 MEQ PO TBCR
10.0000 meq | EXTENDED_RELEASE_TABLET | Freq: Every day | ORAL | Status: DC
  Administered 2021-06-25: 10 meq via ORAL
  Filled 2021-06-25 (×3): qty 1

## 2021-06-25 MED ORDER — GEMFIBROZIL 600 MG PO TABS
600.0000 mg | ORAL_TABLET | Freq: Every day | ORAL | Status: DC
  Administered 2021-06-25 – 2021-06-26 (×2): 600 mg via ORAL
  Filled 2021-06-25 (×4): qty 1

## 2021-06-25 MED ORDER — METOPROLOL TARTRATE 25 MG PO TABS
25.0000 mg | ORAL_TABLET | Freq: Every day | ORAL | Status: DC
  Administered 2021-06-25 – 2021-06-26 (×2): 25 mg via ORAL
  Filled 2021-06-25 (×2): qty 1

## 2021-06-25 NOTE — Progress Notes (Signed)
PROGRESS NOTE        PATIENT DETAILS Name: Raymond [name] Age: 61 y.o. Sex: male Date of Birth: Jun 22, 1960 Admit Date: 06/22/2021 Admitting Physician Kayleen Memos, DO WEX:HBZJI, Talmage Coin, DO  Brief Summary: Patient is a 61 y.o.  male history of out of hospital cardiac arrest due to STEMI-requiring PCI to circumflex on 06/14/2019 (still on ASA/Brilinta), prostate cancer s/p radiation therapy-presenting with upper GI bleeding (melena x3-4 days, hematemesis/hematochezia x1 day)-found to have acute blood loss anemia with a hemoglobin of 6.2-started on PPI/transfused 2 units of PRBC and admitted to [facility] service.  Significant events: 6/14>> admit with UGI and acute blood loss anemia  Significant studies:  6/15>> CTA GI bleed: No active GI bleed, focal area of inflammatory changes in the lesser curvature of the stomach-likely gastritis or gastric ulcer.  Punctate focus of air along the wall of the lesser curvature of the stomach may represent a in the gastric ulcer or contained microperforation.  6/15 TTE -   1. Left ventricular ejection fraction, by estimation, is 45 to 50%. The left ventricle has mildly decreased function. The left ventricle has no regional wall motion abnormalities. There is mild concentric left ventricular hypertrophy. Left ventricular diastolic parameters are consistent with Grade I diastolic dysfunction (impaired relaxation).  2. Right ventricular systolic function is normal. The right ventricular size is normal.  3. The mitral valve is normal in structure. Mild mitral valve regurgitation. No evidence of mitral stenosis.  4. The aortic valve is normal in structure. Aortic valve regurgitation is not visualized. No aortic stenosis is present.  5. There is borderline dilatation of the aortic root, measuring 38 mm. There is borderline dilatation of the ascending aorta, measuring 37 mm.  6. The inferior vena cava is normal in size with greater than 50%  respiratory variability, suggesting right atrial pressure of 3 mmHg. Comparison(s): A prior study was performed on 06/15/2019. The left ventricular function is worsened.   6/15>>  EGD -  Impression:               - Non-bleeding gastric ulcer with adherent clot that is source of hemorrhage. Large deep ulcer with                            adherent clot. Given recent Brilinta, size and shape I decided not to intervene with clips or                          thermal therapy. High risk more bleeding.                           - Gastritis. Biopsied. R/O H pylori                           - LA Grade A reflux esophagitis with no bleeding.                            Single erosion/ulcer                           - The examination was otherwise normal. Recommendation:           -  Return patient to hospital ward for ongoing care. He should be in stepdown or progressive care. If he                            shows signs of more bleeding would give platelets  to overcome Brilinta effects and we could rescope                            and consider hemospray or other therapy vs IR and embolization. Surgical consultation could be needed                            also.                           - NPO and continuous PPI infusion                           - f/u H pylori testing                           - rescope at some point to document healing (2 +                            months) but this will also depend upon clinical                            course                           - serial Hgb                           - will follow                           - needs cardiology consult re: follow-up and                            longer-term treatment plans though can wait on that                            right now  Consults: GI  Subjective:  Patient in bed, appears comfortable, denies any headache, no fever, no chest pain or pressure, no shortness of breath , no abdominal pain. No new focal  weakness.   Objective: Vitals: Blood pressure (!) 146/87, pulse 70, temperature 98.2 F (36.8 C), temperature source Oral, resp. rate 18, height '5\' 11"'$  (1.803 m), weight 84.4 kg, SpO2 97 %.   Exam:  Awake Alert, No new F.N deficits, Normal affect  [place].AT,PERRAL Supple Neck, No JVD,   Symmetrical Chest wall movement, Good air movement bilaterally, CTAB RRR,No Gallops, Rubs or new Murmurs,  +ve B.Sounds, Abd Soft, No tenderness,   No Cyanosis, Clubbing or edema     Assessment/Plan:  Upper GI bleeding with acute blood loss anemia: It is post 2 units of packed RBC transfusion on 06/23/2021 getting 1/3 unit assay 16th 2023, CT abdomen pelvis and EGD report  noted consistent with recently bled gastric ulcer, continue IV PPI, clear liquids, case discussed with GI.  Monitor for another 24 hours closely.  If hemoglobin drops below 7.5 we will transfuse 1 unit of packed RBC along with platelets, if bleeding reoccurs he may require IR versus general surgery input.  CAD: S/p PCI with stent placement 2 years ago, case discussed with cardiology, he was on DAPT, per cardiology can now be transition to aspirin only with outpatient cardiology follow-up postdischarge.  Stop Plavix upon discharge.  Chronic combined systolic and diastolic heart failure echo compared from previous echo in file from 2021 EF around 35% which is stable: Volume status stable, resume home dose beta-blocker and half home dose ACE inhibitor.  Monitor.  HTN: BP stable-BP meds as above.  HLD: Continue statin, also resumed home dose gemfibrozil.  History of prostate cancer: Resume follow-up with his outpatient physicians postdischarge BMI: Estimated body mass index is 25.94 kg/m as calculated from the following:   Height as of this encounter: '5\' 11"'$  (1.803 m).   Weight as of this encounter: 84.4 kg.   Code status:   Code Status: Prior   DVT Prophylaxis:SCD's Place and maintain sequential compression device Start: 06/23/21  1639 Family Communication: Daughter was on speaker phone with the patient while I was rounding on the patient.  Disposition Plan: Status is: Inpatient Remains inpatient appropriate because: Upper GI bleeding with acute blood loss anemia-needs further work-up.  Not yet stable for discharge-probably requires another 2-3 days of hospitalization.   Planned Discharge Destination:Home   Diet: Diet Order             DIET SOFT Room service appropriate? Yes; Fluid consistency: Thin  Diet effective now                   Antimicrobial agents: Anti-infectives (From admission, onward)    None      MEDICATIONS: Scheduled Meds:  sodium chloride   Intravenous Once   atorvastatin  80 mg Oral Daily   gemfibrozil  600 mg Oral Daily   lisinopril  10 mg Oral Daily   metoprolol tartrate  25 mg Oral Daily   [START ON 06/26/2021] pantoprazole  40 mg Intravenous Q12H   potassium chloride  10 mEq Oral Daily   Continuous Infusions:  sodium chloride     pantoprazole 8 mg/hr (06/25/21 0610)   PRN Meds:.acetaminophen, melatonin, polyethylene glycol, prochlorperazine   I have personally reviewed following labs and imaging studies  LABORATORY DATA:  Recent Labs  Lab 06/22/21 2251 06/23/21 0700 06/23/21 0911 06/23/21 1847 06/24/21 0436 06/24/21 1120 06/24/21 1631 06/25/21 0641  WBC 8.1 9.6  --  7.9 6.3  --  7.7 6.5  HGB 6.2* 9.1*   < > 8.1* 7.3* 8.9* 8.6* 8.5*  HCT 20.7* 28.0*   < > 24.8* 22.8* 26.8* 26.0* 26.5*  PLT 244 219  --  211 202  --  194 212  MCV 79.9* 79.3*  --  79.0* 79.2*  --  80.5 81.0  MCH 23.9* 25.8*  --  25.8* 25.3*  --  26.6 26.0  MCHC 30.0 32.5  --  32.7 32.0  --  33.1 32.1  RDW 17.6* 16.1*  --  16.6* 16.9*  --  16.9* 17.4*  LYMPHSABS 1.0 0.8  --   --   --   --   --   --   MONOABS 0.7 0.7  --   --   --   --   --   --  EOSABS 0.1 0.0  --   --   --   --   --   --   BASOSABS 0.0 0.0  --   --   --   --   --   --    < > = values in this interval not displayed.     Recent Labs  Lab 06/22/21 2251 06/23/21 0700 06/24/21 0436  NA 143 140 140  K 3.6 4.0 3.6  CL 113* 109 111  CO2 21* 22 24  GLUCOSE 169* 111* 94  BUN 24* 18 13  CREATININE 1.24 0.87 1.06  CALCIUM 8.7* 8.7* 8.3*  AST 19 13*  --   ALT 15 14  --   ALKPHOS 24* 26*  --   BILITOT 0.6 2.3*  --   ALBUMIN 2.9* 3.2*  --   MG  --  2.0  --   PHOS  --  4.2  --   INR 1.1  --   --         MICROBIOLOGY: No results found for this or any previous visit (from the past 240 hour(s)).  RADIOLOGY STUDIES/RESULTS: ECHOCARDIOGRAM COMPLETE  Result Date: 06/23/2021    ECHOCARDIOGRAM REPORT   Patient Name:   Raymond [name] Date of Exam: 06/23/2021 Medical Rec #:  [id number]     Height:       71.0 in Accession #:    [id number]    Weight:       186.0 lb Date of Birth:  1960-07-07     BSA:          2.045 m Patient Age:    16 years      BP:           120/82 mmHg Patient Gender: M             HR:           69 bpm. Exam Location:  Inpatient Procedure: 2D Echo, Color Doppler and Cardiac Doppler Indications:    R94.31 Abnormal EKG  History:        Patient has prior history of Echocardiogram examinations, most                 recent 06/15/2019. Risk Factors:Hypertension and Polysubstance                 Abuse.  Sonographer:    Raquel Sarna Senior RDCS Referring Phys: [id number] [place]  1. Left ventricular ejection fraction, by estimation, is 45 to 50%. The left ventricle has mildly decreased function. The left ventricle has no regional wall motion abnormalities. There is mild concentric left ventricular hypertrophy. Left ventricular diastolic parameters are consistent with Grade I diastolic dysfunction (impaired relaxation).  2. Right ventricular systolic function is normal. The right ventricular size is normal.  3. The mitral valve is normal in structure. Mild mitral valve regurgitation. No evidence of mitral stenosis.  4. The aortic valve is normal in structure. Aortic valve regurgitation is not visualized. No  aortic stenosis is present.  5. There is borderline dilatation of the aortic root, measuring 38 mm. There is borderline dilatation of the ascending aorta, measuring 37 mm.  6. The inferior vena cava is normal in size with greater than 50% respiratory variability, suggesting right atrial pressure of 3 mmHg. Comparison(s): A prior study was performed on 06/15/2019. The left ventricular function is worsened. FINDINGS  Left Ventricle: Left ventricular ejection fraction, by estimation, is 45 to 50%. The left ventricle has mildly  decreased function. The left ventricle has no regional wall motion abnormalities. The left ventricular internal cavity size was normal in size. There is mild concentric left ventricular hypertrophy. Left ventricular diastolic parameters are consistent with Grade I diastolic dysfunction (impaired relaxation). Right Ventricle: The right ventricular size is normal. No increase in right ventricular wall thickness. Right ventricular systolic function is normal. Left Atrium: Left atrial size was normal in size. Right Atrium: Right atrial size was normal in size. Pericardium: There is no evidence of pericardial effusion. Mitral Valve: The mitral valve is normal in structure. Mild mitral valve regurgitation. No evidence of mitral valve stenosis. Tricuspid Valve: The tricuspid valve is normal in structure. Tricuspid valve regurgitation is not demonstrated. No evidence of tricuspid stenosis. Aortic Valve: The aortic valve is normal in structure. Aortic valve regurgitation is not visualized. No aortic stenosis is present. Pulmonic Valve: The pulmonic valve was normal in structure. Pulmonic valve regurgitation is trivial. No evidence of pulmonic stenosis. Aorta: The aortic root is normal in size and structure. There is borderline dilatation of the aortic root, measuring 38 mm. There is borderline dilatation of the ascending aorta, measuring 37 mm. Venous: The inferior vena cava is normal in size with greater  than 50% respiratory variability, suggesting right atrial pressure of 3 mmHg. IAS/Shunts: No atrial level shunt detected by color flow Doppler.  LEFT VENTRICLE PLAX 2D LVIDd:         5.60 cm      Diastology LVIDs:         4.10 cm      LV e' medial:    4.90 cm/s LV PW:         1.20 cm      LV E/e' medial:  10.7 LV IVS:        1.10 cm      LV e' lateral:   5.98 cm/s LVOT diam:     2.20 cm      LV E/e' lateral: 8.7 LV SV:         68 LV SV Index:   33 LVOT Area:     3.80 cm  LV Volumes (MOD) LV vol d, MOD A2C: 115.0 ml LV vol d, MOD A4C: 121.0 ml LV vol s, MOD A2C: 58.3 ml LV vol s, MOD A4C: 67.7 ml LV SV MOD A2C:     56.7 ml LV SV MOD A4C:     121.0 ml LV SV MOD BP:      57.6 ml RIGHT VENTRICLE RV S prime:     14.00 cm/s TAPSE (M-mode): 2.3 cm LEFT ATRIUM             Index        RIGHT ATRIUM           Index LA diam:        2.70 cm 1.32 cm/m   RA Area:     18.80 cm LA Vol (A2C):   44.9 ml 21.96 ml/m  RA Volume:   46.90 ml  22.94 ml/m LA Vol (A4C):   36.5 ml 17.85 ml/m LA Biplane Vol: 40.2 ml 19.66 ml/m  AORTIC VALVE LVOT Vmax:   97.30 cm/s LVOT Vmean:  62.800 cm/s LVOT VTI:    0.179 m  AORTA Ao Root diam: 3.80 cm Ao Asc diam:  3.70 cm MITRAL VALVE MV Area (PHT): 2.05 cm    SHUNTS MV Decel Time: 370 msec    Systemic VTI:  0.18 m MV E velocity: 52.30 cm/s  Systemic  Diam: 2.20 cm MV A velocity: 73.30 cm/s MV E/A ratio:  0.71 Kardie Tobb DO Electronically signed by Berniece Salines DO Signature Date/Time: 06/23/2021/12:32:35 PM    Final      LOS: 2 days   Signature  Lala Lund M.D on 06/25/2021 at 10:27 AM   -  To page go to [email or web address]

## 2021-06-25 NOTE — Progress Notes (Signed)
   Patient Name: Raymond [name] Date of Encounter: 06/25/2021, 10:50 AM    Subjective  Ready for soft food.  Tolerated full liquids yesterday.  No stools still.      Objective  BP (!) 146/87 (BP Location: Left Arm)   Pulse 70   Temp 98.2 F (36.8 C) (Oral)   Resp 18   Ht '5\' 11"'$  (1.803 m)   Wt 84.4 kg   SpO2 97%   BMI 25.94 kg/m      Latest Ref Rng & Units 06/25/2021    6:41 AM 06/24/2021    4:31 PM 06/24/2021   11:20 AM  CBC  WBC 4.0 - 10.5 K/uL 6.5  7.7    Hemoglobin 13.0 - 17.0 g/dL 8.5  8.6  8.9   Hematocrit 39.0 - 52.0 % 26.5  26.0  26.8   Platelets 150 - 400 K/uL 212  194     Pathology from gastritis at EGD demonstrates inflammation, H. pylori stain is pending   Assessment and Plan  Large gastric ulcer with adherent clot.  So far so good without signs of recurrent bleeding.   Recent direct antiplatelet therapy.  This will be stopped and he will have outpatient cardiology follow-up.   Continue medical care with Protonix infusion.   Okay for soft diet today If well again tomorrow discharge to home He says he has an appointment with PCP on Monday, June 19 already.  I will contact him with final results of pathology and any other treatment plans and arrange follow-up with me. He will eventually need a repeat EGD to confirm healing of this ulcer. Discharge on high-dose PPI (40 mg pantoprazole versus omeprazole) twice daily.  I will reduce to daily at some point when I see him in follow-up but he should take twice daily for 1 month. I believe DAPT will be stopped.  He can start baby aspirin in 1 week.    [name], MD, [facility] See Shea Evans on call - gastroenterology for best contact person 06/25/2021 10:50 AM

## 2021-06-26 ENCOUNTER — Encounter (HOSPITAL_COMMUNITY): Payer: Self-pay | Admitting: Internal Medicine

## 2021-06-26 DIAGNOSIS — K922 Gastrointestinal hemorrhage, unspecified: Secondary | ICD-10-CM | POA: Diagnosis not present

## 2021-06-26 DIAGNOSIS — K920 Hematemesis: Secondary | ICD-10-CM | POA: Diagnosis not present

## 2021-06-26 LAB — CBC
HCT: 27.4 % — ABNORMAL LOW (ref 39.0–52.0)
Hemoglobin: 8.6 g/dL — ABNORMAL LOW (ref 13.0–17.0)
MCH: 26.1 pg (ref 26.0–34.0)
MCHC: 31.4 g/dL (ref 30.0–36.0)
MCV: 83 fL (ref 80.0–100.0)
Platelets: 236 10*3/uL (ref 150–400)
RBC: 3.3 MIL/uL — ABNORMAL LOW (ref 4.22–5.81)
RDW: 17.9 % — ABNORMAL HIGH (ref 11.5–15.5)
WBC: 6.8 10*3/uL (ref 4.0–10.5)
nRBC: 0 % (ref 0.0–0.2)

## 2021-06-26 LAB — BASIC METABOLIC PANEL
Anion gap: 5 (ref 5–15)
BUN: 7 mg/dL — ABNORMAL LOW (ref 8–23)
CO2: 23 mmol/L (ref 22–32)
Calcium: 8.1 mg/dL — ABNORMAL LOW (ref 8.9–10.3)
Chloride: 109 mmol/L (ref 98–111)
Creatinine, Ser: 1.18 mg/dL (ref 0.61–1.24)
GFR, Estimated: >60 mL/min (ref >60)
Glucose, Bld: 139 mg/dL — ABNORMAL HIGH (ref 70–99)
Potassium: 3.5 mmol/L (ref 3.5–5.1)
Sodium: 137 mmol/L (ref 135–145)

## 2021-06-26 LAB — CBC WITH DIFFERENTIAL/PLATELET
Abs Immature Granulocytes: 0.03 10*3/uL (ref 0.00–0.07)
Basophils Absolute: 0 10*3/uL (ref 0.0–0.1)
Basophils Relative: 0 %
Eosinophils Absolute: 0.1 10*3/uL (ref 0.0–0.5)
Eosinophils Relative: 1 %
HCT: 25.6 % — ABNORMAL LOW (ref 39.0–52.0)
Hemoglobin: 8.1 g/dL — ABNORMAL LOW (ref 13.0–17.0)
Immature Granulocytes: 1 %
Lymphocytes Relative: 8 %
Lymphs Abs: 0.5 10*3/uL — ABNORMAL LOW (ref 0.7–4.0)
MCH: 26.4 pg (ref 26.0–34.0)
MCHC: 31.6 g/dL (ref 30.0–36.0)
MCV: 83.4 fL (ref 80.0–100.0)
Monocytes Absolute: 0.8 10*3/uL (ref 0.1–1.0)
Monocytes Relative: 12 %
Neutro Abs: 5.1 10*3/uL (ref 1.7–7.7)
Neutrophils Relative %: 78 %
Platelets: 218 10*3/uL (ref 150–400)
RBC: 3.07 MIL/uL — ABNORMAL LOW (ref 4.22–5.81)
RDW: 18 % — ABNORMAL HIGH (ref 11.5–15.5)
WBC: 6.5 10*3/uL (ref 4.0–10.5)
nRBC: 0 % (ref 0.0–0.2)

## 2021-06-26 MED ORDER — PANTOPRAZOLE SODIUM 40 MG PO TBEC: 40.0000 mg | DELAYED_RELEASE_TABLET | Freq: Two times a day (BID) | ORAL | 3 refills | Status: DC

## 2021-06-26 MED ORDER — POTASSIUM CHLORIDE CRYS ER 10 MEQ PO TBCR
10.0000 meq | EXTENDED_RELEASE_TABLET | Freq: Every day | ORAL | Status: DC
Start: 2021-06-27 — End: 2021-06-26

## 2021-06-26 MED ORDER — POTASSIUM CHLORIDE CRYS ER 20 MEQ PO TBCR
40.0000 meq | EXTENDED_RELEASE_TABLET | Freq: Once | ORAL | Status: AC
  Administered 2021-06-26: 40 meq via ORAL
  Filled 2021-06-26: qty 2

## 2021-06-26 MED ORDER — ASPIRIN 81 MG PO CHEW
81.0000 mg | CHEWABLE_TABLET | Freq: Every day | ORAL | 1 refills | Status: AC
Start: 2021-06-28 — End: ?

## 2021-06-26 NOTE — Discharge Summary (Signed)
[name] TTS:[id number] DOB: December 16, 1960 DOA: 06/22/2021  PCP: Vista Lawman A, DO  Admit date: 06/22/2021  Discharge date: 06/26/2021  Admitted From: Home   Disposition:  Home   Recommendations for Outpatient Follow-up:   Follow up with PCP in 1-2 weeks  PCP Please obtain BMP/CBC, 2 view CXR in 1week,  (see Discharge instructions)   PCP Please follow up on the following pending results: monitor CBC   Home Health: None   Equipment/Devices: None  Consultations: GI, Cards Discharge Condition: Stable    CODE STATUS: Full    Diet Recommendation: Heart Healthy   Diet Order             Diet - low sodium heart healthy           DIET SOFT Room service appropriate? Yes; Fluid consistency: Thin  Diet effective now                    Chief Complaint  Patient presents with   GI Bleeding     Brief history of present illness from the day of admission and additional interim summary     61 y.o.  male history of out of hospital cardiac arrest due to STEMI-requiring PCI to circumflex on 06/14/2019 (still on ASA/Brilinta), prostate cancer s/p radiation therapy-presenting with upper GI bleeding (melena x3-4 days, hematemesis/hematochezia x1 day)-found to have acute blood loss anemia with a hemoglobin of 6.2-started on PPI/transfused 2 units of PRBC and admitted to [facility] service.   Significant events: 6/14>> admit with UGI and acute blood loss anemia   Significant studies:   6/15>> CTA GI bleed: No active GI bleed, focal area of inflammatory changes in the lesser curvature of the stomach-likely gastritis or gastric ulcer.  Punctate focus of air along the wall of the lesser curvature of the stomach may represent a in the gastric ulcer or contained microperforation.   6/15 TTE -    1. Left ventricular ejection  fraction, by estimation, is 45 to 50%. The left ventricle has mildly decreased function. The left ventricle has no regional wall motion abnormalities. There is mild concentric left ventricular hypertrophy. Left ventricular diastolic parameters are consistent with Grade I diastolic dysfunction (impaired relaxation).  2. Right ventricular systolic function is normal. The right ventricular size is normal.  3. The mitral valve is normal in structure. Mild mitral valve regurgitation. No evidence of mitral stenosis.  4. The aortic valve is normal in structure. Aortic valve regurgitation is not visualized. No aortic stenosis is present.  5. There is borderline dilatation of the aortic root, measuring 38 mm. There is borderline dilatation of the ascending aorta, measuring 37 mm.  6. The inferior vena cava is normal in size with greater than 50% respiratory variability, suggesting right atrial pressure of 3 mmHg. Comparison(s): A prior study was performed on 06/15/2019. The left ventricular function is worsened.     6/15>>  EGD -   Impression:               -  Non-bleeding gastric ulcer with adherent clot that is source of hemorrhage. Large deep ulcer with                            adherent clot. Given recent Brilinta, size and shape I decided not to intervene with clips or                          thermal therapy. High risk more bleeding.                           - Gastritis. Biopsied. R/O H pylori                           - LA Grade A reflux esophagitis with no bleeding.                            Single erosion/ulcer                           - The examination was otherwise normal. Recommendation:           - Return patient to hospital ward for ongoing care. He should be in stepdown or progressive care. If he                            shows signs of more bleeding would give platelets  to overcome Brilinta effects and we could rescope                            and consider hemospray or other therapy vs IR  and embolization. Surgical consultation could be needed                            also.                           - NPO and continuous PPI infusion                           - f/u H pylori testing                           - rescope at some point to document healing (2 +                            months) but this will also depend upon clinical                            course                           - serial Hgb                           - will follow                           -  needs cardiology consult re: follow-up and                            longer-term treatment plans though can wait on that                            right now                                                                  Hospital Course    Upper GI bleeding with acute blood loss anemia: It is post 2 units of packed RBC transfusion on 06/23/2021 getting 1/3 unit assay 16th 2023, CT abdomen pelvis and EGD report noted consistent with recently bled gastric ulcer, continue IV PPI, clear liquids, case discussed with GI.  CBC and H&H have been stable over the last 36 hours will be discharged home on oral PPI twice daily, baby aspirin only, discontinue Brilinta.  Follow-up with GI and PCP closely postdischarge.   CAD: S/p PCI with stent placement 2 years ago, case discussed with cardiology, he was on DAPT, per cardiology can now be transition to aspirin only with outpatient cardiology follow-up postdischarge.  Stop Brilinta upon discharge.  Quested to follow-up with his cardiologist within 1 to 2 weeks of discharge.   Chronic combined systolic and diastolic heart failure echo compared from previous echo in file from 2021 EF around 35% which is stable: Volume status stable, blood pressure stable continue home regimen.  HTN: BP stable-BP meds as above.  HLD: Continue statin, also resumed home dose gemfibrozil.   History of prostate cancer: Resume follow-up with his outpatient physicians postdischarge   Discharge  diagnosis     Principal Problem:   GI bleeding Active Problems:   Hematemesis with nausea   Chronic gastric ulcer with hemorrhage    Discharge instructions    Discharge Instructions     Diet - low sodium heart healthy   Complete by: As directed    Discharge instructions   Complete by: As directed    Follow with Primary MD Bonsu, Osei A, DO in 7 days   Get CBC, CMP, 2 view Chest X ray -  checked next visit within 1 week by Primary MD   Activity: As tolerated with Full fall precautions use walker/cane & assistance as needed  Disposition Home   Diet: Heart Healthy  Special Instructions: If you have smoked or chewed Tobacco  in the last 2 yrs please stop smoking, stop any regular Alcohol  and or any Recreational drug use.  On your next visit with your primary care physician please Get Medicines reviewed and adjusted.  Please request your Prim.MD to go over all Hospital Tests and Procedure/Radiological results at the follow up, please get all Hospital records sent to your Prim MD by signing hospital release before you go home.  If you experience worsening of your admission symptoms, develop shortness of breath, life threatening emergency, suicidal or homicidal thoughts you must seek medical attention immediately by calling 911 or calling your MD immediately  if symptoms less severe.  You Must read complete instructions/literature along with all the possible adverse reactions/side effects for all the Medicines you take  and that have been prescribed to you. Take any new Medicines after you have completely understood and accpet all the possible adverse reactions/side effects.   Increase activity slowly   Complete by: As directed        Discharge Medications   Allergies as of 06/26/2021   No Known Allergies      Medication List     STOP taking these medications    ticagrelor 90 MG Tabs tablet Commonly known as: BRILINTA       TAKE these medications     acetaminophen 500 MG tablet Commonly known as: TYLENOL Take 500 mg by mouth daily as needed for headache or mild pain.   aspirin 81 MG chewable tablet Chew 1 tablet (81 mg total) by mouth daily. Start taking on: June 28, 2021 What changed: These instructions start on June 28, 2021. If you are unsure what to do until then, ask your doctor or other care provider.   atorvastatin 80 MG tablet Commonly known as: LIPITOR Take 1 tablet (80 mg total) by mouth daily.   gemfibrozil 600 MG tablet Commonly known as: LOPID Take 600 mg by mouth daily.   lisinopril 20 MG tablet Commonly known as: ZESTRIL Take 20 mg by mouth daily. What changed: Another medication with the same name was removed. Continue taking this medication, and follow the directions you see here.   metoprolol tartrate 25 MG tablet Commonly known as: LOPRESSOR Take 25 mg by mouth daily.   pantoprazole 40 MG tablet Commonly known as: Protonix Take 1 tablet (40 mg total) by mouth 2 (two) times daily before a meal.   potassium chloride 10 MEQ tablet Commonly known as: KLOR-CON TAKE 1 TABLET(10 MEQ) BY MOUTH 1 TIME FOR 1 DOSE   VISINE DRY EYE OP Place 1 drop into both eyes 2 (two) times daily as needed (dry eyes, redness).         Follow-up Information     Bonsu, Osei A, DO. Schedule an appointment as soon as possible for a visit in 1 week(s).   Specialty: Internal Medicine Contact information: [place] [address] [phone]         [name], MD. Schedule an appointment as soon as possible for a visit in 1 week(s).   Specialty: Cardiology Contact information: [address] [place] [address] [phone]         [name], MD. Schedule an appointment as soon as possible for a visit in 1 week(s).   Specialty: Gastroenterology Contact information: [address] [address] [phone]                 Major procedures and  Radiology Reports - PLEASE review detailed and final reports thoroughly  -        ECHOCARDIOGRAM COMPLETE  Result Date: 06/23/2021    ECHOCARDIOGRAM REPORT   Patient Name:   JOESEPH VERVILLE Date of Exam: 06/23/2021 Medical Rec #:  [id number]     Height:       71.0 in Accession #:    [id number]    Weight:       186.0 lb Date of Birth:  [birth year]     BSA:          2.045 m Patient Age:    63 years      BP:           120/82 mmHg Patient Gender: M  HR:           69 bpm. Exam Location:  Inpatient Procedure: 2D Echo, Color Doppler and Cardiac Doppler Indications:    R94.31 Abnormal EKG  History:        Patient has prior history of Echocardiogram examinations, most                 recent 06/15/2019. Risk Factors:Hypertension and Polysubstance                 Abuse.  Sonographer:    Raquel Sarna Senior RDCS Referring Phys: [id number] [place]  1. Left ventricular ejection fraction, by estimation, is 45 to 50%. The left ventricle has mildly decreased function. The left ventricle has no regional wall motion abnormalities. There is mild concentric left ventricular hypertrophy. Left ventricular diastolic parameters are consistent with Grade I diastolic dysfunction (impaired relaxation).  2. Right ventricular systolic function is normal. The right ventricular size is normal.  3. The mitral valve is normal in structure. Mild mitral valve regurgitation. No evidence of mitral stenosis.  4. The aortic valve is normal in structure. Aortic valve regurgitation is not visualized. No aortic stenosis is present.  5. There is borderline dilatation of the aortic root, measuring 38 mm. There is borderline dilatation of the ascending aorta, measuring 37 mm.  6. The inferior vena cava is normal in size with greater than 50% respiratory variability, suggesting right atrial pressure of 3 mmHg. Comparison(s): A prior study was performed on 06/15/2019. The left ventricular function is worsened. FINDINGS  Left Ventricle: Left  ventricular ejection fraction, by estimation, is 45 to 50%. The left ventricle has mildly decreased function. The left ventricle has no regional wall motion abnormalities. The left ventricular internal cavity size was normal in size. There is mild concentric left ventricular hypertrophy. Left ventricular diastolic parameters are consistent with Grade I diastolic dysfunction (impaired relaxation). Right Ventricle: The right ventricular size is normal. No increase in right ventricular wall thickness. Right ventricular systolic function is normal. Left Atrium: Left atrial size was normal in size. Right Atrium: Right atrial size was normal in size. Pericardium: There is no evidence of pericardial effusion. Mitral Valve: The mitral valve is normal in structure. Mild mitral valve regurgitation. No evidence of mitral valve stenosis. Tricuspid Valve: The tricuspid valve is normal in structure. Tricuspid valve regurgitation is not demonstrated. No evidence of tricuspid stenosis. Aortic Valve: The aortic valve is normal in structure. Aortic valve regurgitation is not visualized. No aortic stenosis is present. Pulmonic Valve: The pulmonic valve was normal in structure. Pulmonic valve regurgitation is trivial. No evidence of pulmonic stenosis. Aorta: The aortic root is normal in size and structure. There is borderline dilatation of the aortic root, measuring 38 mm. There is borderline dilatation of the ascending aorta, measuring 37 mm. Venous: The inferior vena cava is normal in size with greater than 50% respiratory variability, suggesting right atrial pressure of 3 mmHg. IAS/Shunts: No atrial level shunt detected by color flow Doppler.  LEFT VENTRICLE PLAX 2D LVIDd:         5.60 cm      Diastology LVIDs:         4.10 cm      LV e' medial:    4.90 cm/s LV PW:         1.20 cm      LV E/e' medial:  10.7 LV IVS:        1.10 cm      LV e'  lateral:   5.98 cm/s LVOT diam:     2.20 cm      LV E/e' lateral: 8.7 LV SV:         68 LV SV  Index:   33 LVOT Area:     3.80 cm  LV Volumes (MOD) LV vol d, MOD A2C: 115.0 ml LV vol d, MOD A4C: 121.0 ml LV vol s, MOD A2C: 58.3 ml LV vol s, MOD A4C: 67.7 ml LV SV MOD A2C:     56.7 ml LV SV MOD A4C:     121.0 ml LV SV MOD BP:      57.6 ml RIGHT VENTRICLE RV S prime:     14.00 cm/s TAPSE (M-mode): 2.3 cm LEFT ATRIUM             Index        RIGHT ATRIUM           Index LA diam:        2.70 cm 1.32 cm/m   RA Area:     18.80 cm LA Vol (A2C):   44.9 ml 21.96 ml/m  RA Volume:   46.90 ml  22.94 ml/m LA Vol (A4C):   36.5 ml 17.85 ml/m LA Biplane Vol: 40.2 ml 19.66 ml/m  AORTIC VALVE LVOT Vmax:   97.30 cm/s LVOT Vmean:  62.800 cm/s LVOT VTI:    0.179 m  AORTA Ao Root diam: 3.80 cm Ao Asc diam:  3.70 cm MITRAL VALVE MV Area (PHT): 2.05 cm    SHUNTS MV Decel Time: 370 msec    Systemic VTI:  0.18 m MV E velocity: 52.30 cm/s  Systemic Diam: 2.20 cm MV A velocity: 73.30 cm/s MV E/A ratio:  0.71 Kardie Tobb DO Electronically signed by Berniece Salines DO Signature Date/Time: 06/23/2021/12:32:35 PM    Final    CT ANGIO GI BLEED  Result Date: 06/23/2021 CLINICAL DATA:  Upper GI bleed. EXAM: CTA ABDOMEN AND PELVIS WITHOUT AND WITH CONTRAST TECHNIQUE: Multidetector CT imaging of the abdomen and pelvis was performed using the standard protocol during bolus administration of intravenous contrast. Multiplanar reconstructed images and MIPs were obtained and reviewed to evaluate the vascular anatomy. RADIATION DOSE REDUCTION: This exam was performed according to the departmental dose-optimization program which includes automated exposure control, adjustment of the mA and/or kV according to patient size and/or use of iterative reconstruction technique. CONTRAST:  13m OMNIPAQUE IOHEXOL 350 MG/ML SOLN COMPARISON:  None Available. FINDINGS: VASCULAR Aorta: Moderate atherosclerotic calcification of the abdominal aorta. No aneurysmal dilatation or dissection. No periaortic fluid collection. Celiac: Patent without evidence of  aneurysm, dissection, vasculitis or significant stenosis. SMA: Patent without evidence of aneurysm, dissection, vasculitis or significant stenosis. Renals: Both renal arteries are patent without evidence of aneurysm, dissection, vasculitis, fibromuscular dysplasia or significant stenosis. IMA: Patent without evidence of aneurysm, dissection, vasculitis or significant stenosis. Inflow: Moderate atherosclerotic calcification of the iliac arteries. No aneurysmal dilatation or dissection. The iliac arteries are patent. Proximal Outflow: Bilateral common femoral and visualized portions of the superficial and profunda femoral arteries are patent without evidence of aneurysm, dissection, vasculitis or significant stenosis. Veins: The IVC is unremarkable. The SMV, splenic vein, and main portal vein are patent. No portal venous gas. Review of the MIP images confirms the above findings. NON-VASCULAR Lower chest: The visualized lung bases are clear. No intra-abdominal free air or free fluid. Hepatobiliary: No focal liver abnormality is seen. No gallstones, gallbladder wall thickening, or biliary dilatation. Pancreas: Unremarkable. No pancreatic ductal dilatation  or surrounding inflammatory changes. Spleen: Normal in size without focal abnormality. Adrenals/Urinary Tract: The adrenal glands are unremarkable. There is no hydronephrosis or nephrolithiasis on either side. The kidneys, visualized ureters, and urinary bladder appear unremarkable. Stomach/Bowel: No extravasated contrast or evidence of active GI bleed. There is focal area of the inflammatory changes and thickening of the wall of the stomach involving the lesser curvature which may represent gastritis or related to underlying gastric ulcer. However, an infiltrative process is not excluded. There is apparent focal area of discontinuity of the wall of the stomach (17/7), possibly ulcer. A small perforation is not excluded. A punctate focus of air along the wall of the  lesser curvature of the stomach (30/6, coronal 24/13, sagittal the 76/14) may represent air in gastric ulcer or possibly focally contained microperforation. Correlation with endoscopy findings is recommended. There is no bowel obstruction. Several scattered colonic diverticula without active inflammatory changes. Appendectomy. Lymphatic: No adenopathy. Reproductive: Prostatectomy. Other: None Musculoskeletal: Osteopenia degenerative changes of the spine. No acute osseous pathology. IMPRESSION: 1. No evidence of active GI bleed. 2. Focal area of inflammatory changes and thickening of the wall of the stomach involving the lesser curvature, possibly gastritis or related to underlying gastric ulcer. However, an infiltrative process is not excluded. A punctate focus of air along the wall of the lesser curvature of the stomach may represent air in gastric ulcer or possibly focally contained microperforation. Correlation with endoscopy findings is recommended. 3. Colonic diverticulosis without active inflammatory changes. 4. Aortic Atherosclerosis (ICD10-I70.0). Electronically Signed   By: Anner Crete M.D.   On: 06/23/2021 03:26    Micro Results    No results found for this or any previous visit (from the past 240 hour(s)).  Today   Subjective    Raymond [name] today has no headache,no chest abdominal pain,no new weakness tingling or numbness, feels much better wants to go home today.    Objective   Blood pressure 124/69, pulse 79, temperature 98.3 F (36.8 C), temperature source Oral, resp. rate 18, height '5\' 11"'$  (1.803 m), weight 84.4 kg, SpO2 99 %.   Intake/Output Summary (Last 24 hours) at 06/26/2021 0732 Last data filed at 06/26/2021 0401 Gross per 24 hour  Intake 240 ml  Output 800 ml  Net -560 ml    Exam  Awake Alert, No new F.N deficits,    [place].AT,PERRAL Supple Neck,   Symmetrical Chest wall movement, Good air movement bilaterally, CTAB RRR,No Gallops,   +ve B.Sounds, Abd Soft, Non  tender,  No Cyanosis, Clubbing or edema    Data Review   Recent Labs  Lab 06/22/21 2251 06/23/21 0700 06/23/21 0911 06/24/21 0436 06/24/21 1120 06/24/21 1631 06/25/21 0641 06/26/21 0235 06/26/21 0609  WBC 8.1 9.6   < > 6.3  --  7.7 6.5 6.5 6.8  HGB 6.2* 9.1*   < > 7.3* 8.9* 8.6* 8.5* 8.1* 8.6*  HCT 20.7* 28.0*   < > 22.8* 26.8* 26.0* 26.5* 25.6* 27.4*  PLT 244 219   < > 202  --  194 212 218 236  MCV 79.9* 79.3*   < > 79.2*  --  80.5 81.0 83.4 83.0  MCH 23.9* 25.8*   < > 25.3*  --  26.6 26.0 26.4 26.1  MCHC 30.0 32.5   < > 32.0  --  33.1 32.1 31.6 31.4  RDW 17.6* 16.1*   < > 16.9*  --  16.9* 17.4* 18.0* 17.9*  LYMPHSABS 1.0 0.8  --   --   --   --   --  0.5*  --   MONOABS 0.7 0.7  --   --   --   --   --  0.8  --   EOSABS 0.1 0.0  --   --   --   --   --  0.1  --   BASOSABS 0.0 0.0  --   --   --   --   --  0.0  --    < > = values in this interval not displayed.    Recent Labs  Lab 06/22/21 2251 06/23/21 0700 06/24/21 0436 06/26/21 0235  NA 143 140 140 137  K 3.6 4.0 3.6 3.5  CL 113* 109 111 109  CO2 21* '22 24 23  '$ GLUCOSE 169* 111* 94 139*  BUN 24* 18 13 7*  CREATININE 1.24 0.87 1.06 1.18  CALCIUM 8.7* 8.7* 8.3* 8.1*  AST 19 13*  --   --   ALT 15 14  --   --   ALKPHOS 24* 26*  --   --   BILITOT 0.6 2.3*  --   --   ALBUMIN 2.9* 3.2*  --   --   MG  --  2.0  --   --   PHOS  --  4.2  --   --   INR 1.1  --   --   --     Total Time in preparing paper work, data evaluation and todays exam - 35 minutes  Lala Lund M.D on 06/26/2021 at 7:32 AM  [facility]

## 2021-06-26 NOTE — Progress Notes (Signed)
Patient d/c home with spouse. Discharge instructions reviewed, patient states understanding. VS stable. Leaving via private vehicle with family friend. Fuller Canada, RN

## 2021-06-26 NOTE — Discharge Instructions (Signed)
Follow with Primary MD Bonsu, Osei A, DO in 7 days   Get CBC, CMP, 2 view Chest X ray -  checked next visit within 1 week by Primary MD   Activity: As tolerated with Full fall precautions use walker/cane & assistance as needed  Disposition Home   Diet: Heart Healthy  Special Instructions: If you have smoked or chewed Tobacco  in the last 2 yrs please stop smoking, stop any regular Alcohol  and or any Recreational drug use.  On your next visit with your primary care physician please Get Medicines reviewed and adjusted.  Please request your Prim.MD to go over all Hospital Tests and Procedure/Radiological results at the follow up, please get all Hospital records sent to your Prim MD by signing hospital release before you go home.  If you experience worsening of your admission symptoms, develop shortness of breath, life threatening emergency, suicidal or homicidal thoughts you must seek medical attention immediately by calling 911 or calling your MD immediately  if symptoms less severe.  You Must read complete instructions/literature along with all the possible adverse reactions/side effects for all the Medicines you take and that have been prescribed to you. Take any new Medicines after you have completely understood and accpet all the possible adverse reactions/side effects.

## 2021-06-28 ENCOUNTER — Encounter: Payer: Self-pay | Admitting: Internal Medicine

## 2021-06-28 DIAGNOSIS — B9681 Helicobacter pylori [H. pylori] as the cause of diseases classified elsewhere: Secondary | ICD-10-CM | POA: Insufficient documentation

## 2021-06-28 DIAGNOSIS — K250 Acute gastric ulcer with hemorrhage: Secondary | ICD-10-CM

## 2021-06-28 HISTORY — DX: Acute gastric ulcer with hemorrhage: K25.0

## 2021-06-28 HISTORY — DX: Helicobacter pylori (H. pylori) as the cause of diseases classified elsewhere: B96.81

## 2021-06-28 LAB — SURGICAL PATHOLOGY

## 2021-06-29 ENCOUNTER — Emergency Department (EMERGENCY_DEPARTMENT_HOSPITAL): Payer: Medicaid Other | Admitting: Certified Registered Nurse Anesthetist

## 2021-06-29 ENCOUNTER — Encounter (HOSPITAL_COMMUNITY): Payer: Self-pay

## 2021-06-29 ENCOUNTER — Other Ambulatory Visit: Payer: Self-pay

## 2021-06-29 ENCOUNTER — Emergency Department (HOSPITAL_COMMUNITY): Payer: Medicaid Other | Admitting: Certified Registered Nurse Anesthetist

## 2021-06-29 ENCOUNTER — Inpatient Hospital Stay (HOSPITAL_COMMUNITY)
Admission: EM | Admit: 2021-06-29 | Discharge: 2021-07-02 | DRG: 378 | Disposition: A | Discharge status: Home / Self Care | Payer: Medicaid Other | Attending: Family Medicine | Admitting: Family Medicine

## 2021-06-29 ENCOUNTER — Encounter (HOSPITAL_COMMUNITY)
Admission: EM | Disposition: A | Discharge status: Home / Self Care | Payer: Self-pay | Source: Home / Self Care | Attending: Family Medicine

## 2021-06-29 DIAGNOSIS — Z87820 Personal history of traumatic brain injury: Secondary | ICD-10-CM | POA: Diagnosis not present

## 2021-06-29 DIAGNOSIS — I10 Essential (primary) hypertension: Secondary | ICD-10-CM

## 2021-06-29 DIAGNOSIS — K2970 Gastritis, unspecified, without bleeding: Secondary | ICD-10-CM

## 2021-06-29 DIAGNOSIS — I252 Old myocardial infarction: Secondary | ICD-10-CM

## 2021-06-29 DIAGNOSIS — Z9079 Acquired absence of other genital organ(s): Secondary | ICD-10-CM

## 2021-06-29 DIAGNOSIS — K922 Gastrointestinal hemorrhage, unspecified: Secondary | ICD-10-CM | POA: Diagnosis present

## 2021-06-29 DIAGNOSIS — B9681 Helicobacter pylori [H. pylori] as the cause of diseases classified elsewhere: Secondary | ICD-10-CM | POA: Diagnosis present

## 2021-06-29 DIAGNOSIS — Z96651 Presence of right artificial knee joint: Secondary | ICD-10-CM | POA: Diagnosis present

## 2021-06-29 DIAGNOSIS — I110 Hypertensive heart disease with heart failure: Secondary | ICD-10-CM | POA: Diagnosis present

## 2021-06-29 DIAGNOSIS — I959 Hypotension, unspecified: Secondary | ICD-10-CM | POA: Diagnosis present

## 2021-06-29 DIAGNOSIS — N400 Enlarged prostate without lower urinary tract symptoms: Secondary | ICD-10-CM | POA: Diagnosis present

## 2021-06-29 DIAGNOSIS — Z8546 Personal history of malignant neoplasm of prostate: Secondary | ICD-10-CM | POA: Diagnosis not present

## 2021-06-29 DIAGNOSIS — K250 Acute gastric ulcer with hemorrhage: Principal | ICD-10-CM | POA: Diagnosis present

## 2021-06-29 DIAGNOSIS — F1010 Alcohol abuse, uncomplicated: Secondary | ICD-10-CM | POA: Diagnosis present

## 2021-06-29 DIAGNOSIS — K259 Gastric ulcer, unspecified as acute or chronic, without hemorrhage or perforation: Secondary | ICD-10-CM

## 2021-06-29 DIAGNOSIS — D649 Anemia, unspecified: Secondary | ICD-10-CM | POA: Diagnosis not present

## 2021-06-29 DIAGNOSIS — E785 Hyperlipidemia, unspecified: Secondary | ICD-10-CM | POA: Diagnosis present

## 2021-06-29 DIAGNOSIS — K2100 Gastro-esophageal reflux disease with esophagitis, without bleeding: Secondary | ICD-10-CM | POA: Diagnosis present

## 2021-06-29 DIAGNOSIS — Z7982 Long term (current) use of aspirin: Secondary | ICD-10-CM

## 2021-06-29 DIAGNOSIS — Z955 Presence of coronary angioplasty implant and graft: Secondary | ICD-10-CM

## 2021-06-29 DIAGNOSIS — C61 Malignant neoplasm of prostate: Secondary | ICD-10-CM | POA: Diagnosis present

## 2021-06-29 DIAGNOSIS — Z87891 Personal history of nicotine dependence: Secondary | ICD-10-CM

## 2021-06-29 DIAGNOSIS — Z923 Personal history of irradiation: Secondary | ICD-10-CM

## 2021-06-29 DIAGNOSIS — I2510 Atherosclerotic heart disease of native coronary artery without angina pectoris: Secondary | ICD-10-CM | POA: Diagnosis present

## 2021-06-29 DIAGNOSIS — Z79899 Other long term (current) drug therapy: Secondary | ICD-10-CM | POA: Diagnosis not present

## 2021-06-29 DIAGNOSIS — F32A Depression, unspecified: Secondary | ICD-10-CM | POA: Diagnosis present

## 2021-06-29 DIAGNOSIS — D62 Acute posthemorrhagic anemia: Secondary | ICD-10-CM | POA: Diagnosis present

## 2021-06-29 DIAGNOSIS — K254 Chronic or unspecified gastric ulcer with hemorrhage: Secondary | ICD-10-CM | POA: Diagnosis not present

## 2021-06-29 DIAGNOSIS — I5042 Chronic combined systolic (congestive) and diastolic (congestive) heart failure: Secondary | ICD-10-CM | POA: Diagnosis present

## 2021-06-29 HISTORY — PX: ESOPHAGOGASTRODUODENOSCOPY (EGD) WITH PROPOFOL: SHX5813

## 2021-06-29 HISTORY — PX: SCHLEROTHERAPY: SHX5440

## 2021-06-29 HISTORY — PX: HEMOSTASIS CLIP PLACEMENT: SHX6857

## 2021-06-29 LAB — CBC WITH DIFFERENTIAL/PLATELET
Abs Immature Granulocytes: 0.04 10*3/uL (ref 0.00–0.07)
Abs Immature Granulocytes: 0.09 10*3/uL — ABNORMAL HIGH (ref 0.00–0.07)
Basophils Absolute: 0 10*3/uL (ref 0.0–0.1)
Basophils Absolute: 0 10*3/uL (ref 0.0–0.1)
Basophils Relative: 0 %
Basophils Relative: 0 %
Eosinophils Absolute: 0.1 10*3/uL (ref 0.0–0.5)
Eosinophils Absolute: 0 10*3/uL (ref 0.0–0.5)
Eosinophils Relative: 1 %
Eosinophils Relative: 0 %
HCT: 27 % — ABNORMAL LOW (ref 39.0–52.0)
HCT: 27.6 % — ABNORMAL LOW (ref 39.0–52.0)
Hemoglobin: 7.9 g/dL — ABNORMAL LOW (ref 13.0–17.0)
Hemoglobin: 8.4 g/dL — ABNORMAL LOW (ref 13.0–17.0)
Immature Granulocytes: 0 %
Immature Granulocytes: 1 %
Lymphocytes Relative: 12 %
Lymphocytes Relative: 3 %
Lymphs Abs: 1.1 10*3/uL (ref 0.7–4.0)
Lymphs Abs: 0.4 10*3/uL — ABNORMAL LOW (ref 0.7–4.0)
MCH: 25.2 pg — ABNORMAL LOW (ref 26.0–34.0)
MCH: 27.2 pg (ref 26.0–34.0)
MCHC: 29.3 g/dL — ABNORMAL LOW (ref 30.0–36.0)
MCHC: 30.4 g/dL (ref 30.0–36.0)
MCV: 86.3 fL (ref 80.0–100.0)
MCV: 89.3 fL (ref 80.0–100.0)
Monocytes Absolute: 0.8 10*3/uL (ref 0.1–1.0)
Monocytes Absolute: 0.4 10*3/uL (ref 0.1–1.0)
Monocytes Relative: 8 %
Monocytes Relative: 4 %
Neutro Abs: 7.3 10*3/uL (ref 1.7–7.7)
Neutro Abs: 10.4 10*3/uL — ABNORMAL HIGH (ref 1.7–7.7)
Neutrophils Relative %: 79 %
Neutrophils Relative %: 92 %
Platelets: 241 10*3/uL (ref 150–400)
Platelets: 231 10*3/uL (ref 150–400)
RBC: 3.13 MIL/uL — ABNORMAL LOW (ref 4.22–5.81)
RBC: 3.09 MIL/uL — ABNORMAL LOW (ref 4.22–5.81)
RDW: 18.6 % — ABNORMAL HIGH (ref 11.5–15.5)
RDW: 17.9 % — ABNORMAL HIGH (ref 11.5–15.5)
WBC: 9.3 10*3/uL (ref 4.0–10.5)
WBC: 11.3 10*3/uL — ABNORMAL HIGH (ref 4.0–10.5)
nRBC: 0 % (ref 0.0–0.2)
nRBC: 0 % (ref 0.0–0.2)

## 2021-06-29 LAB — COMPREHENSIVE METABOLIC PANEL
ALT: 10 U/L (ref 0–44)
AST: 19 U/L (ref 15–41)
Albumin: 2.8 g/dL — ABNORMAL LOW (ref 3.5–5.0)
Alkaline Phosphatase: 31 U/L — ABNORMAL LOW (ref 38–126)
Anion gap: 8 (ref 5–15)
BUN: 13 mg/dL (ref 8–23)
CO2: 22 mmol/L (ref 22–32)
Calcium: 8.8 mg/dL — ABNORMAL LOW (ref 8.9–10.3)
Chloride: 111 mmol/L (ref 98–111)
Creatinine, Ser: 1.17 mg/dL (ref 0.61–1.24)
GFR, Estimated: >60 mL/min (ref >60)
Glucose, Bld: 142 mg/dL — ABNORMAL HIGH (ref 70–99)
Potassium: 4.1 mmol/L (ref 3.5–5.1)
Sodium: 141 mmol/L (ref 135–145)
Total Bilirubin: 0.3 mg/dL (ref 0.3–1.2)
Total Protein: 5.1 g/dL — ABNORMAL LOW (ref 6.5–8.1)

## 2021-06-29 LAB — RAPID URINE DRUG SCREEN, HOSP PERFORMED
Amphetamines: NOT DETECTED
Barbiturates: NOT DETECTED
Benzodiazepines: NOT DETECTED
Cocaine: NOT DETECTED
Opiates: NOT DETECTED
Tetrahydrocannabinol: NOT DETECTED

## 2021-06-29 LAB — PREPARE RBC (CROSSMATCH)

## 2021-06-29 LAB — TROPONIN I (HIGH SENSITIVITY)
Troponin I (High Sensitivity): 19 ng/L — ABNORMAL HIGH (ref <18)
Troponin I (High Sensitivity): 21 ng/L — ABNORMAL HIGH (ref <18)

## 2021-06-29 SURGERY — ESOPHAGOGASTRODUODENOSCOPY (EGD) WITH PROPOFOL
Anesthesia: Monitor Anesthesia Care

## 2021-06-29 MED ORDER — DEXAMETHASONE SODIUM PHOSPHATE 10 MG/ML IJ SOLN
INTRAMUSCULAR | Status: DC | PRN
  Administered 2021-06-29: 5 mg via INTRAVENOUS

## 2021-06-29 MED ORDER — ACETAMINOPHEN 325 MG PO TABS: 650.0000 mg | ORAL_TABLET | Freq: Four times a day (QID) | ORAL | Status: DC | PRN

## 2021-06-29 MED ORDER — PANTOPRAZOLE 80MG IVPB - SIMPLE MED
80.0000 mg | Freq: Once | INTRAVENOUS | Status: AC
  Administered 2021-06-29: 80 mg via INTRAVENOUS
  Filled 2021-06-29: qty 80

## 2021-06-29 MED ORDER — LACTATED RINGERS IV BOLUS
1000.0000 mL | Freq: Once | INTRAVENOUS | Status: AC
  Administered 2021-06-29: 1000 mL via INTRAVENOUS

## 2021-06-29 MED ORDER — ROCURONIUM BROMIDE 10 MG/ML (PF) SYRINGE
PREFILLED_SYRINGE | INTRAVENOUS | Status: DC | PRN
  Administered 2021-06-29: 5 mg via INTRAVENOUS
  Administered 2021-06-29: 35 mg via INTRAVENOUS

## 2021-06-29 MED ORDER — ONDANSETRON HCL 4 MG/2ML IJ SOLN
4.0000 mg | Freq: Once | INTRAMUSCULAR | Status: AC
  Administered 2021-06-29: 4 mg via INTRAVENOUS
  Filled 2021-06-29: qty 2

## 2021-06-29 MED ORDER — DOXYCYCLINE HYCLATE 50 MG PO CAPS: 100.0000 mg | ORAL_CAPSULE | Freq: Two times a day (BID) | ORAL | 0 refills | Status: DC

## 2021-06-29 MED ORDER — SUGAMMADEX SODIUM 200 MG/2ML IV SOLN
INTRAVENOUS | Status: DC | PRN
  Administered 2021-06-29: 200 mg via INTRAVENOUS

## 2021-06-29 MED ORDER — SODIUM CHLORIDE (PF) 0.9 % IJ SOLN
PREFILLED_SYRINGE | INTRAMUSCULAR | Status: DC | PRN
  Administered 2021-06-29: 3 mL

## 2021-06-29 MED ORDER — SODIUM CHLORIDE 0.9% FLUSH
3.0000 mL | Freq: Two times a day (BID) | INTRAVENOUS | Status: DC
  Administered 2021-06-29 – 2021-07-02 (×5): 3 mL via INTRAVENOUS

## 2021-06-29 MED ORDER — PROPOFOL 10 MG/ML IV BOLUS
INTRAVENOUS | Status: DC | PRN
  Administered 2021-06-29: 130 mg via INTRAVENOUS

## 2021-06-29 MED ORDER — ACETAMINOPHEN 650 MG RE SUPP: 650.0000 mg | Freq: Four times a day (QID) | RECTAL | Status: DC | PRN

## 2021-06-29 MED ORDER — ONDANSETRON HCL 4 MG/2ML IJ SOLN
INTRAMUSCULAR | Status: DC | PRN
  Administered 2021-06-29: 4 mg via INTRAVENOUS

## 2021-06-29 MED ORDER — SODIUM CHLORIDE 0.9 % IV SOLN: 10.0000 mL/h | Freq: Once | INTRAVENOUS | Status: DC

## 2021-06-29 MED ORDER — BISMUTH SUBSALICYLATE 262 MG PO CHEW
524.0000 mg | CHEWABLE_TABLET | Freq: Four times a day (QID) | ORAL | Status: DC
  Administered 2021-06-29 – 2021-07-02 (×10): 524 mg via ORAL
  Filled 2021-06-29 (×13): qty 2

## 2021-06-29 MED ORDER — ATORVASTATIN CALCIUM 80 MG PO TABS: 80.0000 mg | ORAL_TABLET | Freq: Every day | ORAL | Status: DC

## 2021-06-29 MED ORDER — LACTATED RINGERS IV SOLN: INTRAVENOUS | Status: DC

## 2021-06-29 MED ORDER — METRONIDAZOLE 250 MG PO TABS: 250.0000 mg | ORAL_TABLET | Freq: Four times a day (QID) | ORAL | 0 refills | Status: DC

## 2021-06-29 MED ORDER — SUCCINYLCHOLINE CHLORIDE 200 MG/10ML IV SOSY
PREFILLED_SYRINGE | INTRAVENOUS | Status: DC | PRN
  Administered 2021-06-29: 160 mg via INTRAVENOUS

## 2021-06-29 MED ORDER — GEMFIBROZIL 600 MG PO TABS
600.0000 mg | ORAL_TABLET | Freq: Two times a day (BID) | ORAL | Status: DC
  Administered 2021-06-29 – 2021-06-30 (×2): 600 mg via ORAL
  Filled 2021-06-29 (×2): qty 1

## 2021-06-29 MED ORDER — BISMUTH SUBSALICYLATE 262 MG PO CHEW: 524.0000 mg | CHEWABLE_TABLET | Freq: Four times a day (QID) | ORAL | 0 refills | Status: DC

## 2021-06-29 MED ORDER — PHENYLEPHRINE 80 MCG/ML (10ML) SYRINGE FOR IV PUSH (FOR BLOOD PRESSURE SUPPORT)
PREFILLED_SYRINGE | INTRAVENOUS | Status: DC | PRN
  Administered 2021-06-29: 160 ug via INTRAVENOUS

## 2021-06-29 MED ORDER — SODIUM CHLORIDE 0.9 % IV SOLN
100.0000 mg | Freq: Two times a day (BID) | INTRAVENOUS | Status: DC
  Administered 2021-06-29 – 2021-07-02 (×6): 100 mg via INTRAVENOUS
  Filled 2021-06-29 (×6): qty 100

## 2021-06-29 MED ORDER — MORPHINE SULFATE (PF) 2 MG/ML IV SOLN: 2.0000 mg | INTRAVENOUS | Status: DC | PRN

## 2021-06-29 MED ORDER — HYDRALAZINE HCL 20 MG/ML IJ SOLN: 5.0000 mg | INTRAMUSCULAR | Status: DC | PRN

## 2021-06-29 MED ORDER — ONDANSETRON HCL 4 MG PO TABS: 4.0000 mg | ORAL_TABLET | Freq: Four times a day (QID) | ORAL | Status: DC | PRN

## 2021-06-29 MED ORDER — LIDOCAINE 2% (20 MG/ML) 5 ML SYRINGE
INTRAMUSCULAR | Status: DC | PRN
  Administered 2021-06-29: 40 mg via INTRAVENOUS

## 2021-06-29 MED ORDER — PANTOPRAZOLE INFUSION (NEW) - SIMPLE MED
8.0000 mg/h | INTRAVENOUS | Status: DC
Start: 2021-06-29 — End: 2021-07-02
  Administered 2021-06-29 – 2021-07-02 (×8): 8 mg/h via INTRAVENOUS
  Filled 2021-06-29 (×2): qty 100
  Filled 2021-06-29 (×2): qty 80
  Filled 2021-06-29: qty 100
  Filled 2021-06-29 (×4): qty 80
  Filled 2021-06-29: qty 100

## 2021-06-29 MED ORDER — EPINEPHRINE 1 MG/10ML IJ SOSY
PREFILLED_SYRINGE | INTRAMUSCULAR | Status: AC
  Filled 2021-06-29: qty 10

## 2021-06-29 MED ORDER — SODIUM CHLORIDE 0.9 % IV SOLN: INTRAVENOUS | Status: DC | PRN

## 2021-06-29 MED ORDER — METRONIDAZOLE 500 MG/100ML IV SOLN
500.0000 mg | Freq: Two times a day (BID) | INTRAVENOUS | Status: DC
  Administered 2021-06-29 – 2021-07-02 (×5): 500 mg via INTRAVENOUS
  Filled 2021-06-29 (×6): qty 100

## 2021-06-29 MED ORDER — ONDANSETRON HCL 4 MG/2ML IJ SOLN: 4.0000 mg | Freq: Four times a day (QID) | INTRAMUSCULAR | Status: DC | PRN

## 2021-06-29 MED ORDER — METOPROLOL TARTRATE 12.5 MG HALF TABLET
12.5000 mg | ORAL_TABLET | Freq: Two times a day (BID) | ORAL | Status: DC
  Administered 2021-06-29 – 2021-07-02 (×6): 12.5 mg via ORAL
  Filled 2021-06-29 (×6): qty 1

## 2021-06-29 SURGICAL SUPPLY — 15 items

## 2021-06-29 NOTE — H&P (View-Only) (Signed)
Consultation  Referring Provider:   Dr. Melina Copa   Primary Care Physician:  Gevena Mart, DO Primary Gastroenterologist:  Althia Forts       Reason for Consultation:  Hematemesis            HPI:   Raymond [name] is a 61 y.o. male with past medical history as listed below including gastric ulcer with hemorrhage, who returns to the ER today after an episode of hematemesis at home.    Recent admission and consult to our service 6/15 for an acute GI bleed.  At that time hemoglobin is 6.2, CT angio with no active GI bleeding.  There was a focal area of inflammatory changes and thickening of the wall of the stomach, lesser curve and an apparent focal area of discomfort January of the wall of the stomach possibly ulcer with punctate focus of air along the wall of the lesser curve of the stomach, question and gastric ulcer or possibly focally contained microperforation.  He was transfused 2 units of PRBCs and underwent an EGD on 6/15 with a large nonbleeding gastric ulcer with adherent clot that was the source of hemorrhage, there was no intervention done given recent Brilinta and high risk for more bleeding.  Also gastritis which was biopsied and LA grade a reflux esophagitis.  Patient's hemoglobin stabilized with no further signs of bleeding and he was discharged on 06/25/2021 on high-dose PPI.  His Brilinta was also stopped.  Hemoglobin remained stable at 8.9 on day of discharge.    Today, patient presents to the ER accompanied by his daughters who assists with history.  Apparently he had been doing well at home after recent hospitalization but had not yet picked up his Pantoprazole.  In fact they just got a call from the pharmacy this morning that it was ready.  He describes that he bent over to pick up the remote for the TV this morning and felt dizzy and had to sit down and then proceeded to have bloody vomiting which "filled the bathtub" per his daughters.  He was then brought to the ER.  He tells me he  has not been on Brilinta for a long time, apparently stopped this on his own even prior to last hospitalization.  Denies abdominal pain or melena.    Does tell me that he woke up at 3:00 in the morning and ate a chicken wing and a pork chop.  He has not had anything to eat since then.    Denies fever, chills or weight loss.  ER course: Labs pending  GI history: As above  Past Medical History:  Diagnosis Date   Acute gastric ulcer with hemorrhage 06/28/2021   Acute respiratory failure (HCC)    AKI (acute kidney injury) ([place])    Arthritis    At risk for sleep apnea    STOP-BANG= 4    SENT TO PCP 07-09-2014   BPH (benign prostatic hypertrophy)    Cardiac arrest ([place]) 06/14/2019   Dental caries    periodontitis   Depression    Elevated PSA    Helicobacter pylori gastritis 06/28/2021   History of concussion    yrs ago-- no LOC-- no residual   History of depression    Hypertension    Nocturia    Prostate cancer ([place])    Wears glasses     Past Surgical History:  Procedure Laterality Date   APPENDECTOMY  age 45   BIOPSY  06/23/2021   Procedure:  BIOPSY;  Surgeon: [name], MD;  Location: [facility] ENDOSCOPY;  Service: Gastroenterology;;   CORONARY STENT INTERVENTION N/A 06/14/2019   Procedure: CORONARY STENT INTERVENTION;  Surgeon: [name], MD;  Location: [place] CV LAB;  Service: Cardiovascular;  Laterality: N/A;   CORONARY/GRAFT ACUTE MI REVASCULARIZATION N/A 06/14/2019   Procedure: Coronary/Graft Acute MI Revascularization;  Surgeon: [name], MD;  Location: [place] CV LAB;  Service: Cardiovascular;  Laterality: N/A;   CYSTOSCOPY WITH URETHRAL DILATATION N/A 06/15/2019   Procedure: CYSTOSCOPY WITH BALLOON URETHRAL DILATATION;  Surgeon: [name], MD;  Location: [place];  Service: Urology;  Laterality: N/A;   ESOPHAGOGASTRODUODENOSCOPY (EGD) WITH PROPOFOL N/A 06/23/2021   Procedure: ESOPHAGOGASTRODUODENOSCOPY (EGD) WITH PROPOFOL;  Surgeon: [name], MD;   Location: [place];  Service: Gastroenterology;  Laterality: N/A;   LEFT HEART CATH AND CORONARY ANGIOGRAPHY N/A 06/14/2019   Procedure: LEFT HEART CATH AND CORONARY ANGIOGRAPHY;  Surgeon: [name], MD;  Location: [place] CV LAB;  Service: Cardiovascular;  Laterality: N/A;   MULTIPLE EXTRACTIONS WITH ALVEOLOPLASTY N/A 10/12/2017   Procedure: MULTIPLE EXTRACTION WITH ALVEOLOPLASTY;  Surgeon: [name], DDS;  Location: [place];  Service: Oral Surgery;  Laterality: N/A;   PELVIC LYMPH NODE DISSECTION Bilateral 11/04/2018   Procedure: PELVIC LYMPH NODE DISSECTION;  Surgeon: [name], MD;  Location: [facility];  Service: Urology;  Laterality: Bilateral;   PROSTATE BIOPSY N/A 07/15/2014   Procedure: SATURATION BIOPSY TRANSRECTAL ULTRASONIC PROSTATE (TUBP);  Surgeon: [name], MD;  Location: [facility];  Service: Urology;  Laterality: N/A;   PROSTATE BIOPSY N/A 08/07/2018   Procedure: BIOPSY TRANSRECTAL ULTRASONIC PROSTATE (TUBP);  Surgeon: [name], MD;  Location: [facility];  Service: Urology;  Laterality: N/A;  ONLY NEEDS 30 MIN   RIGHT KNEE ARTHROSCOPY /  MENISECTOMY/  DEBRIDEMENT OF CHONDROMALACIA PATELLA AND MEDIAL SHELF PLICA/  SUBCHONDROPLASTY  06-10-2010   ROBOT ASSISTED LAPAROSCOPIC RADICAL PROSTATECTOMY N/A 11/04/2018   Procedure: XI ROBOTIC ASSISTED LAPAROSCOPIC RADICAL PROSTATECTOMY;  Surgeon: [name], MD;  Location: [facility];  Service: Urology;  Laterality: N/A;   TOTAL KNEE ARTHROPLASTY  05/31/2011   Procedure: TOTAL KNEE ARTHROPLASTY;  Surgeon: [name], MD;  Location: [place];  Service: Orthopedics;  Laterality: Right;  with revision stem    Family History  Problem Relation Age of Onset   Prostate cancer Neg Hx    Colon cancer Neg Hx    Breast cancer Neg Hx     Social History   Tobacco Use   Smoking status: Former    Packs/day: 0.50    Years: 4.00    Total pack years: 2.00    Types:  Cigarettes    Quit date: 08/28/2018    Years since quitting: 2.8   Smokeless tobacco: Never  Vaping Use   Vaping Use: Never used  Substance Use Topics   Alcohol use: Yes    Comment: SOCIAL 1 beer a day   Drug use: Never    Comment: hx marijuana use- none recently states on 10/30/2018    Prior to Admission medications   Medication Sig Start Date End Date Taking? Authorizing Provider  acetaminophen (TYLENOL) 500 MG tablet Take 500 mg by mouth daily as needed for headache or mild pain.    [provider]  aspirin 81 MG chewable tablet Chew 1 tablet (81 mg total) by mouth daily. 06/28/21   Thurnell Lose, MD  atorvastatin (LIPITOR) 80  MG tablet Take 1 tablet (80 mg total) by mouth daily. 06/21/19   Mercy Riding, MD  bismuth subsalicylate (PEPTO-BISMOL) 262 MG chewable tablet Chew 2 tablets (524 mg total) by mouth QID for 14 days. 06/29/21 07/13/21  [name], MD  doxycycline (VIBRAMYCIN) 50 MG capsule Take 2 capsules (100 mg total) by mouth 2 (two) times daily for 14 days. 06/29/21 07/13/21  [name], MD  gemfibrozil (LOPID) 600 MG tablet Take 600 mg by mouth daily. 09/26/17   [provider]  Glycerin-Hypromellose-PEG 400 (VISINE DRY EYE OP) Place 1 drop into both eyes 2 (two) times daily as needed (dry eyes, redness).    [provider]  lisinopril (ZESTRIL) 20 MG tablet Take 20 mg by mouth daily. 04/30/21   [provider]  metoprolol tartrate (LOPRESSOR) 25 MG tablet Take 25 mg by mouth daily.    [provider]  metroNIDAZOLE (FLAGYL) 250 MG tablet Take 1 tablet (250 mg total) by mouth 4 (four) times daily for 14 days. 06/29/21 07/13/21  [name], MD  pantoprazole (PROTONIX) 40 MG tablet Take 1 tablet (40 mg total) by mouth 2 (two) times daily before a meal. 06/26/21   Thurnell Lose, MD  potassium chloride (KLOR-CON) 10 MEQ tablet TAKE 1 TABLET(10 MEQ) BY MOUTH 1 TIME FOR 1 DOSE Patient not taking: Reported on 06/23/2021 12/08/20    Sherren Mocha, MD    Current Facility-Administered Medications  Medication Dose Route Frequency Provider Last Rate Last Admin   pantoprazole (PROTONIX) 80 mg /NS 100 mL IVPB  80 mg Intravenous Once Hayden Rasmussen, MD 300 mL/hr at 06/29/21 1200 80 mg at 06/29/21 1200   pantoprozole (PROTONIX) 80 mg /NS 100 mL infusion  8 mg/hr Intravenous Continuous Hayden Rasmussen, MD 10 mL/hr at 06/29/21 1157 8 mg/hr at 06/29/21 1157   Current Outpatient Medications  Medication Sig Dispense Refill   acetaminophen (TYLENOL) 500 MG tablet Take 500 mg by mouth daily as needed for headache or mild pain.     aspirin 81 MG chewable tablet Chew 1 tablet (81 mg total) by mouth daily. 90 tablet 1   atorvastatin (LIPITOR) 80 MG tablet Take 1 tablet (80 mg total) by mouth daily. 90 tablet 1   bismuth subsalicylate (PEPTO-BISMOL) 262 MG chewable tablet Chew 2 tablets (524 mg total) by mouth QID for 14 days. 112 tablet 0   doxycycline (VIBRAMYCIN) 50 MG capsule Take 2 capsules (100 mg total) by mouth 2 (two) times daily for 14 days. 56 capsule 0   gemfibrozil (LOPID) 600 MG tablet Take 600 mg by mouth daily.  3   Glycerin-Hypromellose-PEG 400 (VISINE DRY EYE OP) Place 1 drop into both eyes 2 (two) times daily as needed (dry eyes, redness).     lisinopril (ZESTRIL) 20 MG tablet Take 20 mg by mouth daily.     metoprolol tartrate (LOPRESSOR) 25 MG tablet Take 25 mg by mouth daily.     metroNIDAZOLE (FLAGYL) 250 MG tablet Take 1 tablet (250 mg total) by mouth 4 (four) times daily for 14 days. 56 tablet 0   pantoprazole (PROTONIX) 40 MG tablet Take 1 tablet (40 mg total) by mouth 2 (two) times daily before a meal. 60 tablet 3   potassium chloride (KLOR-CON) 10 MEQ tablet TAKE 1 TABLET(10 MEQ) BY MOUTH 1 TIME FOR 1 DOSE (Patient not taking: Reported on 06/23/2021) 30 tablet 1   Facility-Administered Medications Ordered in Other Encounters  Medication Dose Route Frequency Provider Last  Rate Last Admin   sodium phosphate  (FLEET) 7-19 GM/118ML enema 1 enema  1 enema Rectal Once Winter, Christopher Aaron, MD        Allergies as of 06/29/2021   (No Known Allergies)     Review of Systems:    Constitutional: No weight loss, fever or chills Skin: No rash  Cardiovascular: No chest pain Respiratory: No SOB Gastrointestinal: See HPI and otherwise negative Genitourinary: No dysuria  Neurological: +dizziness Musculoskeletal: No new muscle or joint pain Hematologic: +bleeding Psychiatric: No history of depression or anxiety    Physical Exam:  Vital signs in last 24 hours: Pulse Rate:  [63-67] 63 (06/21 1200) Resp:  [15-23] 17 (06/21 1200) BP: (79-104)/(47-70) 104/60 (06/21 1200) SpO2:  [99 %] 99 % (06/21 1200)   General:   Pleasant AA male appears to be in NAD, Well developed, Well nourished, alert and cooperative Head:  Normocephalic and atraumatic. Eyes:   PEERL, EOMI. No icterus. Conjunctiva pale Ears:  Normal auditory acuity. Neck:  Supple Throat: Oral cavity and pharynx without inflammation, swelling or lesion.  Lungs: Respirations even and unlabored. Lungs clear to auscultation bilaterally.   No wheezes, crackles, or rhonchi.  Heart: Normal S1, S2. No MRG. Regular rate and rhythm. No peripheral edema, cyanosis or pallor.  Abdomen:  Soft, nondistended, nontender. No rebound or guarding. Normal bowel sounds. No appreciable masses or hepatomegaly. Rectal:  Not performed.  Msk:  Symmetrical without gross deformities. Peripheral pulses intact.  Extremities:  Without edema, no deformity or joint abnormality. Normal ROM, normal sensation. Neurologic:  Alert and  oriented x4;  grossly normal neurologically. Skin:   Dry and intact without significant lesions or rashes. Psychiatric: Demonstrates good judgement and reason without abnormal affect or behaviors.   LAB RESULTS: Pending at time of note   Impression / Plan:   Impression: 1.  Hematemesis: Recent admission with EGD showing large gastric  ulcer with adherent clot, not treated at that time as patient was on Brilinta, discharged 6/17 with stable hemoglobin around 8.9, did not start Pantoprazole 40 twice daily as he never picked it up from pharmacy, this morning became dizzy and started again with hematemesis; likely known gastric ulcer 2.  Anemia: With above, labs pending 3.  CAD status post MI 2021 with cardiac arrest-status post PCI/PTCA and DES to the proximal circumflex 4.  History of prostate cancer status post surgery and chemotherapy 5.  History of hypertension  Plan: 1.  Awaiting CBC and CMP, if hemoglobin less than 7 then will need to transfuse 2.  Pending above planning for EGD this afternoon with Dr. Lorenso Courier.  Did discuss risks,  benefits, limitations and alternatives with patient and he agrees to proceed. 3.  Agree with Protonix as ordered 4.  Patient will be n.p.o. for now 5.  Please await further recommendations after upcoming EGD  Thank you for your kind consultation, we will continue to follow.  Lavone Nian [facility]  06/29/2021, 12:16 PM

## 2021-06-29 NOTE — Transfer of Care (Signed)
Immediate Anesthesia Transfer of Care Note  Patient: Raymond [name]  Procedure(s) Performed: ESOPHAGOGASTRODUODENOSCOPY (EGD) WITH PROPOFOL HEMOSTASIS CLIP PLACEMENT SCHLEROTHERAPy  Patient Location: PACU  Anesthesia Type:General  Level of Consciousness: awake and alert   Airway & Oxygen Therapy: Patient Spontanous Breathing and Patient connected to nasal cannula oxygen  Post-op Assessment: Report given to RN and Post -op Vital signs reviewed and stable  Post vital signs: Reviewed and stable  Last Vitals:  Vitals Value Taken Time  BP 148/54 06/29/21 1525  Temp 36.7 C 06/29/21 1525  Pulse 92 06/29/21 1525  Resp 18 06/29/21 1534  SpO2 99 % 06/29/21 1525  Vitals shown include unvalidated device data.  Last Pain:  Vitals:   06/29/21 1525  TempSrc: Temporal  PainSc: 0-No pain         Complications: No notable events documented.

## 2021-06-29 NOTE — Anesthesia Procedure Notes (Signed)
Procedure Name: Intubation Date/Time: 06/29/2021 2:15 PM  Performed by: Reece Agar, CRNAPre-anesthesia Checklist: Patient identified, Emergency Drugs available, Suction available and Patient being monitored Patient Re-evaluated:Patient Re-evaluated prior to induction Oxygen Delivery Method: Circle System Utilized Preoxygenation: Pre-oxygenation with 100% oxygen Induction Type: IV induction, Rapid sequence and Cricoid Pressure applied Laryngoscope Size: Mac and 4 Grade View: Grade I Tube type: Oral Tube size: 8.0 mm Number of attempts: 1 Airway Equipment and Method: Stylet Placement Confirmation: ETT inserted through vocal cords under direct vision, positive ETCO2 and breath sounds checked- equal and bilateral Secured at: 23 cm Tube secured with: Tape Dental Injury: Teeth and Oropharynx as per pre-operative assessment

## 2021-06-29 NOTE — Consult Note (Signed)
Consultation  Referring Provider:   Dr. Melina Copa   Primary Care Physician:  Gevena Mart, DO Primary Gastroenterologist:  Althia Forts       Reason for Consultation:  Hematemesis            HPI:   Raymond [name] is a 61 y.o. male with past medical history as listed below including gastric ulcer with hemorrhage, who returns to the ER today after an episode of hematemesis at home.    Recent admission and consult to our service 6/15 for an acute GI bleed.  At that time hemoglobin is 6.2, CT angio with no active GI bleeding.  There was a focal area of inflammatory changes and thickening of the wall of the stomach, lesser curve and an apparent focal area of discomfort January of the wall of the stomach possibly ulcer with punctate focus of air along the wall of the lesser curve of the stomach, question and gastric ulcer or possibly focally contained microperforation.  He was transfused 2 units of PRBCs and underwent an EGD on 6/15 with a large nonbleeding gastric ulcer with adherent clot that was the source of hemorrhage, there was no intervention done given recent Brilinta and high risk for more bleeding.  Also gastritis which was biopsied and LA grade a reflux esophagitis.  Patient's hemoglobin stabilized with no further signs of bleeding and he was discharged on 06/25/2021 on high-dose PPI.  His Brilinta was also stopped.  Hemoglobin remained stable at 8.9 on day of discharge.    Today, patient presents to the ER accompanied by his daughters who assists with history.  Apparently he had been doing well at home after recent hospitalization but had not yet picked up his Pantoprazole.  In fact they just got a call from the pharmacy this morning that it was ready.  He describes that he bent over to pick up the remote for the TV this morning and felt dizzy and had to sit down and then proceeded to have bloody vomiting which "filled the bathtub" per his daughters.  He was then brought to the ER.  He tells me he  has not been on Brilinta for a long time, apparently stopped this on his own even prior to last hospitalization.  Denies abdominal pain or melena.    Does tell me that he woke up at 3:00 in the morning and ate a chicken wing and a pork chop.  He has not had anything to eat since then.    Denies fever, chills or weight loss.  ER course: Labs pending  GI history: As above  Past Medical History:  Diagnosis Date   Acute gastric ulcer with hemorrhage 06/28/2021   Acute respiratory failure (HCC)    AKI (acute kidney injury) ([place])    Arthritis    At risk for sleep apnea    STOP-BANG= 4    SENT TO PCP 07-09-2014   BPH (benign prostatic hypertrophy)    Cardiac arrest ([place]) 06/14/2019   Dental caries    periodontitis   Depression    Elevated PSA    Helicobacter pylori gastritis 06/28/2021   History of concussion    yrs ago-- no LOC-- no residual   History of depression    Hypertension    Nocturia    Prostate cancer ([place])    Wears glasses     Past Surgical History:  Procedure Laterality Date   APPENDECTOMY  age 34   BIOPSY  06/23/2021   Procedure:  BIOPSY;  Surgeon: [name], MD;  Location: [facility] ENDOSCOPY;  Service: Gastroenterology;;   CORONARY STENT INTERVENTION N/A 06/14/2019   Procedure: CORONARY STENT INTERVENTION;  Surgeon: [name], MD;  Location: [place] [facility];  Service: Cardiovascular;  Laterality: N/A;   CORONARY/GRAFT ACUTE MI REVASCULARIZATION N/A 06/14/2019   Procedure: Coronary/Graft Acute MI Revascularization;  Surgeon: [name], MD;  Location: [place] [facility];  Service: Cardiovascular;  Laterality: N/A;   CYSTOSCOPY WITH URETHRAL DILATATION N/A 06/15/2019   Procedure: CYSTOSCOPY WITH BALLOON URETHRAL DILATATION;  Surgeon: [name], MD;  Location: [place];  Service: Urology;  Laterality: N/A;   ESOPHAGOGASTRODUODENOSCOPY (EGD) WITH PROPOFOL N/A 06/23/2021   Procedure: ESOPHAGOGASTRODUODENOSCOPY (EGD) WITH PROPOFOL;  Surgeon: [name], MD;   Location: [place];  Service: Gastroenterology;  Laterality: N/A;   LEFT HEART CATH AND CORONARY ANGIOGRAPHY N/A 06/14/2019   Procedure: LEFT HEART CATH AND CORONARY ANGIOGRAPHY;  Surgeon: [name], MD;  Location: [place] [facility];  Service: Cardiovascular;  Laterality: N/A;   MULTIPLE EXTRACTIONS WITH ALVEOLOPLASTY N/A 10/12/2017   Procedure: MULTIPLE EXTRACTION WITH ALVEOLOPLASTY;  Surgeon: [name], DDS;  Location: [place];  Service: Oral Surgery;  Laterality: N/A;   PELVIC LYMPH NODE DISSECTION Bilateral 11/04/2018   Procedure: PELVIC LYMPH NODE DISSECTION;  Surgeon: [name], MD;  Location: [facility];  Service: Urology;  Laterality: Bilateral;   PROSTATE BIOPSY N/A 07/15/2014   Procedure: SATURATION BIOPSY TRANSRECTAL ULTRASONIC PROSTATE (TUBP);  Surgeon: [name], MD;  Location: [facility];  Service: Urology;  Laterality: N/A;   PROSTATE BIOPSY N/A 08/07/2018   Procedure: BIOPSY TRANSRECTAL ULTRASONIC PROSTATE (TUBP);  Surgeon: [name], MD;  Location: [facility];  Service: Urology;  Laterality: N/A;  ONLY NEEDS 30 MIN   RIGHT KNEE ARTHROSCOPY /  MENISECTOMY/  DEBRIDEMENT OF CHONDROMALACIA PATELLA AND MEDIAL SHELF PLICA/  SUBCHONDROPLASTY  06-10-2010   ROBOT ASSISTED LAPAROSCOPIC RADICAL PROSTATECTOMY N/A 11/04/2018   Procedure: XI ROBOTIC ASSISTED LAPAROSCOPIC RADICAL PROSTATECTOMY;  Surgeon: [name], MD;  Location: [facility];  Service: Urology;  Laterality: N/A;   TOTAL KNEE ARTHROPLASTY  05/31/2011   Procedure: TOTAL KNEE ARTHROPLASTY;  Surgeon: [name], MD;  Location: [place];  Service: Orthopedics;  Laterality: Right;  with revision stem    Family History  Problem Relation Age of Onset   Prostate cancer Neg Hx    Colon cancer Neg Hx    Breast cancer Neg Hx     Social History   Tobacco Use   Smoking status: Former    Packs/day: 0.50    Years: 4.00    Total pack years: 2.00    Types:  Cigarettes    Quit date: 08/28/2018    Years since quitting: 2.8   Smokeless tobacco: Never  Vaping Use   Vaping Use: Never used  Substance Use Topics   Alcohol use: Yes    Comment: SOCIAL 1 beer a day   Drug use: Never    Comment: hx marijuana use- none recently states on 10/30/2018    Prior to Admission medications   Medication Sig Start Date End Date Taking? Authorizing Provider  acetaminophen (TYLENOL) 500 MG tablet Take 500 mg by mouth daily as needed for headache or mild pain.    [provider]  aspirin 81 MG chewable tablet Chew 1 tablet (81 mg total) by mouth daily. 06/28/21   Thurnell Lose, MD  atorvastatin (LIPITOR) 80  MG tablet Take 1 tablet (80 mg total) by mouth daily. 06/21/19   Mercy Riding, MD  bismuth subsalicylate (PEPTO-BISMOL) 262 MG chewable tablet Chew 2 tablets (524 mg total) by mouth QID for 14 days. 06/29/21 07/13/21  [name], MD  doxycycline (VIBRAMYCIN) 50 MG capsule Take 2 capsules (100 mg total) by mouth 2 (two) times daily for 14 days. 06/29/21 07/13/21  [name], MD  gemfibrozil (LOPID) 600 MG tablet Take 600 mg by mouth daily. 09/26/17   [provider]  Glycerin-Hypromellose-PEG 400 (VISINE DRY EYE OP) Place 1 drop into both eyes 2 (two) times daily as needed (dry eyes, redness).    [provider]  lisinopril (ZESTRIL) 20 MG tablet Take 20 mg by mouth daily. 04/30/21   [provider]  metoprolol tartrate (LOPRESSOR) 25 MG tablet Take 25 mg by mouth daily.    [provider]  metroNIDAZOLE (FLAGYL) 250 MG tablet Take 1 tablet (250 mg total) by mouth 4 (four) times daily for 14 days. 06/29/21 07/13/21  [name], MD  pantoprazole (PROTONIX) 40 MG tablet Take 1 tablet (40 mg total) by mouth 2 (two) times daily before a meal. 06/26/21   Thurnell Lose, MD  potassium chloride (KLOR-CON) 10 MEQ tablet TAKE 1 TABLET(10 MEQ) BY MOUTH 1 TIME FOR 1 DOSE Patient not taking: Reported on 06/23/2021 12/08/20    Sherren Mocha, MD    Current Facility-Administered Medications  Medication Dose Route Frequency Provider Last Rate Last Admin   pantoprazole (PROTONIX) 80 mg /NS 100 mL IVPB  80 mg Intravenous Once Hayden Rasmussen, MD 300 mL/hr at 06/29/21 1200 80 mg at 06/29/21 1200   pantoprozole (PROTONIX) 80 mg /NS 100 mL infusion  8 mg/hr Intravenous Continuous Hayden Rasmussen, MD 10 mL/hr at 06/29/21 1157 8 mg/hr at 06/29/21 1157   Current Outpatient Medications  Medication Sig Dispense Refill   acetaminophen (TYLENOL) 500 MG tablet Take 500 mg by mouth daily as needed for headache or mild pain.     aspirin 81 MG chewable tablet Chew 1 tablet (81 mg total) by mouth daily. 90 tablet 1   atorvastatin (LIPITOR) 80 MG tablet Take 1 tablet (80 mg total) by mouth daily. 90 tablet 1   bismuth subsalicylate (PEPTO-BISMOL) 262 MG chewable tablet Chew 2 tablets (524 mg total) by mouth QID for 14 days. 112 tablet 0   doxycycline (VIBRAMYCIN) 50 MG capsule Take 2 capsules (100 mg total) by mouth 2 (two) times daily for 14 days. 56 capsule 0   gemfibrozil (LOPID) 600 MG tablet Take 600 mg by mouth daily.  3   Glycerin-Hypromellose-PEG 400 (VISINE DRY EYE OP) Place 1 drop into both eyes 2 (two) times daily as needed (dry eyes, redness).     lisinopril (ZESTRIL) 20 MG tablet Take 20 mg by mouth daily.     metoprolol tartrate (LOPRESSOR) 25 MG tablet Take 25 mg by mouth daily.     metroNIDAZOLE (FLAGYL) 250 MG tablet Take 1 tablet (250 mg total) by mouth 4 (four) times daily for 14 days. 56 tablet 0   pantoprazole (PROTONIX) 40 MG tablet Take 1 tablet (40 mg total) by mouth 2 (two) times daily before a meal. 60 tablet 3   potassium chloride (KLOR-CON) 10 MEQ tablet TAKE 1 TABLET(10 MEQ) BY MOUTH 1 TIME FOR 1 DOSE (Patient not taking: Reported on 06/23/2021) 30 tablet 1   Facility-Administered Medications Ordered in Other Encounters  Medication Dose Route Frequency Provider Last  Rate Last Admin   sodium phosphate  (FLEET) 7-19 GM/118ML enema 1 enema  1 enema Rectal Once Winter, Christopher Aaron, MD        Allergies as of 06/29/2021   (No Known Allergies)     Review of Systems:    Constitutional: No weight loss, fever or chills Skin: No rash  Cardiovascular: No chest pain Respiratory: No SOB Gastrointestinal: See HPI and otherwise negative Genitourinary: No dysuria  Neurological: +dizziness Musculoskeletal: No new muscle or joint pain Hematologic: +bleeding Psychiatric: No history of depression or anxiety    Physical Exam:  Vital signs in last 24 hours: Pulse Rate:  [63-67] 63 (06/21 1200) Resp:  [15-23] 17 (06/21 1200) BP: (79-104)/(47-70) 104/60 (06/21 1200) SpO2:  [99 %] 99 % (06/21 1200)   General:   Pleasant AA male appears to be in NAD, Well developed, Well nourished, alert and cooperative Head:  Normocephalic and atraumatic. Eyes:   PEERL, EOMI. No icterus. Conjunctiva pale Ears:  Normal auditory acuity. Neck:  Supple Throat: Oral cavity and pharynx without inflammation, swelling or lesion.  Lungs: Respirations even and unlabored. Lungs clear to auscultation bilaterally.   No wheezes, crackles, or rhonchi.  Heart: Normal S1, S2. No MRG. Regular rate and rhythm. No peripheral edema, cyanosis or pallor.  Abdomen:  Soft, nondistended, nontender. No rebound or guarding. Normal bowel sounds. No appreciable masses or hepatomegaly. Rectal:  Not performed.  Msk:  Symmetrical without gross deformities. Peripheral pulses intact.  Extremities:  Without edema, no deformity or joint abnormality. Normal ROM, normal sensation. Neurologic:  Alert and  oriented x4;  grossly normal neurologically. Skin:   Dry and intact without significant lesions or rashes. Psychiatric: Demonstrates good judgement and reason without abnormal affect or behaviors.   LAB RESULTS: Pending at time of note   Impression / Plan:   Impression: 1.  Hematemesis: Recent admission with EGD showing large gastric  ulcer with adherent clot, not treated at that time as patient was on Brilinta, discharged 6/17 with stable hemoglobin around 8.9, did not start Pantoprazole 40 twice daily as he never picked it up from pharmacy, this morning became dizzy and started again with hematemesis; likely known gastric ulcer 2.  Anemia: With above, labs pending 3.  CAD status post MI 2021 with cardiac arrest-status post PCI/PTCA and DES to the proximal circumflex 4.  History of prostate cancer status post surgery and chemotherapy 5.  History of hypertension  Plan: 1.  Awaiting CBC and CMP, if hemoglobin less than 7 then will need to transfuse 2.  Pending above planning for EGD this afternoon with Dr. Lorenso Courier.  Did discuss risks,  benefits, limitations and alternatives with patient and he agrees to proceed. 3.  Agree with Protonix as ordered 4.  Patient will be n.p.o. for now 5.  Please await further recommendations after upcoming EGD  Thank you for your kind consultation, we will continue to follow.  Lavone Nian [facility]  06/29/2021, 12:16 PM

## 2021-06-29 NOTE — Progress Notes (Signed)
Both missfired

## 2021-06-29 NOTE — Interval H&P Note (Signed)
History and Physical Interval Note:  06/29/2021 3:17 PM  Raymond [name]  has presented today for surgery, with the diagnosis of Hematemesis.  The various methods of treatment have been discussed with the patient and family. After consideration of risks, benefits and other options for treatment, the patient has consented to  Procedure(s): ESOPHAGOGASTRODUODENOSCOPY (EGD) WITH PROPOFOL (N/A) HEMOSTASIS CLIP PLACEMENT SCHLEROTHERAPy as a surgical intervention.  The patient's history has been reviewed, patient examined, no change in status, stable for surgery.  I have reviewed the patient's chart and labs.  Questions were answered to the patient's satisfaction.     Sharyn Creamer

## 2021-06-29 NOTE — ED Triage Notes (Signed)
Pt BIB [facility] from home d/t GI Bleed from a gastric ulcer he was diagnosed with 2 days ago. He was advised by his PCP to stop his Brilinta recently. Has been having lower abd pain, n/v/d with bloody emesis that had dark clots in it. EMS reports upon their arrival he was pale, diaphoretic & gray in color with initial SBP of 50. After 350 cc of LR fluid bolus in a 20g PIV in Lt AC his SBP was 80, pulse was in the 80's, A/Ox4.

## 2021-06-29 NOTE — ED Notes (Signed)
Endo just took pt to their dept for EGD, their staff was handed the 1st unit of blood that had just arrived to unit for pts transfusion.

## 2021-06-29 NOTE — Anesthesia Preprocedure Evaluation (Addendum)
Anesthesia Evaluation  Patient identified by MRN, date of birth, ID band Patient awake    Reviewed: Allergy & Precautions, NPO status , Patient's Chart, lab work & pertinent test results, reviewed documented beta blocker date and time   Airway Mallampati: II  TM Distance: >3 FB Neck ROM: Full    Dental  (+) Poor Dentition   Pulmonary neg pulmonary ROS, former smoker,    Pulmonary exam normal        Cardiovascular hypertension, Pt. on home beta blockers and Pt. on medications + CAD, + Past MI and + Cardiac Stents   Rhythm:Regular Rate:Normal  TTE 2023 1. Left ventricular ejection fraction, by estimation, is 45 to 50%. The  left ventricle has mildly decreased function. The left ventricle has no  regional wall motion abnormalities. There is mild concentric left  ventricular hypertrophy. Left ventricular  diastolic parameters are consistent with Grade I diastolic dysfunction  (impaired relaxation).  2. Right ventricular systolic function is normal. The right ventricular  size is normal.  3. The mitral valve is normal in structure. Mild mitral valve  regurgitation. No evidence of mitral stenosis.  4. The aortic valve is normal in structure. Aortic valve regurgitation is  not visualized. No aortic stenosis is present.  5. There is borderline dilatation of the aortic root, measuring 38 mm.  There is borderline dilatation of the ascending aorta, measuring 37 mm.  6. The inferior vena cava is normal in size with greater than 50%  respiratory variability, suggesting right atrial pressure of 3 mmHg.   Cath 2021 Post intervention, there is a 0% residual stenosis. Prox Cx to Mid Cx lesion is 85% stenosed. A stent was successfully placed.   Out of hospital cardiac arrest most likely due to transient occlusion of the proximal circumflex with reperfusion and evidence for  85% proximal stenosis with TIMI-3 flow. Normal LAD and right  coronary artery. Preserved global LV contractility with LVEDP 10 mmHg. Successful PCI to the proximal circumflex vessel with PTCA and ultimate insertion of a 3.5 x 15 mm Resolute DES stent with the stenosis being reduced to 0%.    Neuro/Psych Seizures -,  PSYCHIATRIC DISORDERS Depression    GI/Hepatic PUD, (+)     substance abuse  alcohol use and cocaine use,   Endo/Other  negative endocrine ROS  Renal/GU Renal disease  negative genitourinary   Musculoskeletal  (+) Arthritis ,   Abdominal Normal abdominal exam  (+)   Peds  Hematology  (+) Blood dyscrasia, anemia , Lab Results      Component                Value               Date                      WBC                      9.3                 06/29/2021                HGB                      7.9 (L)             06/29/2021                HCT  27.0 (L)            06/29/2021                MCV                      86.3                06/29/2021                PLT                      241                 06/29/2021              Anesthesia Other Findings 61 y.o. male with medical history significant for coronary artery disease on Aspirin and Brilinta, status post NSTEMI in 2021 after out of hospital cardiac arrest requiring CPR, cardiology recommended DAPT for 1 year, however, he was still taking Brilinta and aspirin.  History of prostate cancer, stage I adenocarcinoma, essential hypertension, hyperlipidemia, tobacco use disorder, who presented to [facility] ED from home via EMS due to intermittent lower GI bleed x2 days and 1 episode of bloody emesis on the day of his presentation  Reproductive/Obstetrics                           Anesthesia Physical Anesthesia Plan  ASA: 3  Anesthesia Plan: General   Post-op Pain Management:    Induction: Rapid sequence and Intravenous  PONV Risk Score and Plan: 1 and Propofol infusion and Treatment may vary due to age or medical condition  Airway  Management Planned: Oral ETT  Additional Equipment:   Intra-op Plan:   Post-operative Plan:   Informed Consent: I have reviewed the patients History and Physical, chart, labs and discussed the procedure including the risks, benefits and alternatives for the proposed anesthesia with the patient or authorized representative who has indicated his/her understanding and acceptance.     Dental advisory given  Plan Discussed with: CRNA  Anesthesia Plan Comments:        Anesthesia Quick Evaluation

## 2021-06-29 NOTE — ED Provider Notes (Signed)
[facility] EMERGENCY DEPARTMENT Provider Note   CSN: [id number] Arrival date & time: 06/29/21  1119     History  Chief Complaint  Patient presents with   GI Bleed    Raymond [name] is a 61 y.o. male.  He has a prior history of cardiac disease and stents on Brilinta.  He was admitted last week for GI bleed found to have a large gastric ulcer.  He has not started on his acid medication that he was given at discharge.  He is complaining of 2 episodes of vomiting of dark blood with clots this morning associated with nausea and diaphoresis, near syncope.  EMS found with blood pressures in the 70s and gave IV fluids.  Patient denies any chest pain or abdominal pain.  He said he has not drank any alcohol since leaving the hospital but has continued to smoke.  He has not taken his ticagrelor since discharge.  The history is provided by the patient.  GI Problem This is a recurrent problem. The current episode started 1 to 2 hours ago. The problem has not changed since onset.Pertinent negatives include no chest pain, no abdominal pain, no headaches and no shortness of breath. Nothing aggravates the symptoms. Nothing relieves the symptoms. He has tried nothing for the symptoms. The treatment provided no relief.       Home Medications Prior to Admission medications   Medication Sig Start Date End Date Taking? Authorizing Provider  acetaminophen (TYLENOL) 500 MG tablet Take 500 mg by mouth daily as needed for headache or mild pain.    [provider]  aspirin 81 MG chewable tablet Chew 1 tablet (81 mg total) by mouth daily. 06/28/21   Thurnell Lose, MD  atorvastatin (LIPITOR) 80 MG tablet Take 1 tablet (80 mg total) by mouth daily. 06/21/19   Mercy Riding, MD  bismuth subsalicylate (PEPTO-BISMOL) 262 MG chewable tablet Chew 2 tablets (524 mg total) by mouth QID for 14 days. 06/29/21 07/13/21  [name], MD  doxycycline (VIBRAMYCIN) 50 MG capsule Take 2 capsules (100  mg total) by mouth 2 (two) times daily for 14 days. 06/29/21 07/13/21  [name], MD  gemfibrozil (LOPID) 600 MG tablet Take 600 mg by mouth daily. 09/26/17   [provider]  Glycerin-Hypromellose-PEG 400 (VISINE DRY EYE OP) Place 1 drop into both eyes 2 (two) times daily as needed (dry eyes, redness).    [provider]  lisinopril (ZESTRIL) 20 MG tablet Take 20 mg by mouth daily. 04/30/21   [provider]  metoprolol tartrate (LOPRESSOR) 25 MG tablet Take 25 mg by mouth daily.    [provider]  metroNIDAZOLE (FLAGYL) 250 MG tablet Take 1 tablet (250 mg total) by mouth 4 (four) times daily for 14 days. 06/29/21 07/13/21  [name], MD  pantoprazole (PROTONIX) 40 MG tablet Take 1 tablet (40 mg total) by mouth 2 (two) times daily before a meal. 06/26/21   Thurnell Lose, MD  potassium chloride (KLOR-CON) 10 MEQ tablet TAKE 1 TABLET(10 MEQ) BY MOUTH 1 TIME FOR 1 DOSE Patient not taking: Reported on 06/23/2021 12/08/20   Sherren Mocha, MD      Allergies    Patient has no known allergies.    Review of Systems   Review of Systems  Constitutional:  Positive for diaphoresis. Negative for fever.  HENT:  Negative for sore throat.   Eyes:  Negative for visual disturbance.  Respiratory:  Negative for  shortness of breath.   Cardiovascular:  Negative for chest pain.  Gastrointestinal:  Positive for nausea and vomiting. Negative for abdominal pain.  Genitourinary:  Negative for dysuria.  Musculoskeletal:  Negative for back pain.  Skin:  Negative for rash.  Neurological:  Positive for light-headedness. Negative for headaches.    Physical Exam Updated Vital Signs BP 126/76 (BP Location: Left Arm)   Pulse 73   Temp 98.8 F (37.1 C) (Oral)   Resp 17   Ht '5\' 11"'$  (1.803 m)   Wt 84.4 kg   SpO2 98%   BMI 25.94 kg/m  Physical Exam Vitals and nursing note reviewed.  Constitutional:      General: He is not in acute distress.    Appearance: Normal  appearance. He is well-developed.  HENT:     Head: Normocephalic and atraumatic.  Eyes:     Conjunctiva/sclera: Conjunctivae normal.  Cardiovascular:     Rate and Rhythm: Normal rate and regular rhythm.     Heart sounds: No murmur heard. Pulmonary:     Effort: Pulmonary effort is normal. No respiratory distress.     Breath sounds: Normal breath sounds.  Abdominal:     Palpations: Abdomen is soft.     Tenderness: There is no abdominal tenderness. There is no guarding or rebound.  Musculoskeletal:        General: Normal range of motion.     Cervical back: Neck supple.     Right lower leg: No edema.     Left lower leg: No edema.  Skin:    General: Skin is warm and dry.     Capillary Refill: Capillary refill takes less than 2 seconds.  Neurological:     General: No focal deficit present.     Mental Status: He is alert.     ED Results / Procedures / Treatments   Labs (all labs ordered are listed, but only abnormal results are displayed) Labs Reviewed  COMPREHENSIVE METABOLIC PANEL - Abnormal; Notable for the following components:      Result Value   Glucose, Bld 142 (*)    Calcium 8.8 (*)    Total Protein 5.1 (*)    Albumin 2.8 (*)    Alkaline Phosphatase 31 (*)    All other components within normal limits  CBC WITH DIFFERENTIAL/PLATELET - Abnormal; Notable for the following components:   RBC 3.13 (*)    Hemoglobin 7.9 (*)    HCT 27.0 (*)    MCH 25.2 (*)    MCHC 29.3 (*)    RDW 18.6 (*)    All other components within normal limits  CBC WITH DIFFERENTIAL/PLATELET - Abnormal; Notable for the following components:   WBC 11.3 (*)    RBC 3.09 (*)    Hemoglobin 8.4 (*)    HCT 27.6 (*)    RDW 17.9 (*)    Neutro Abs 10.4 (*)    Lymphs Abs 0.4 (*)    Abs Immature Granulocytes 0.09 (*)    All other components within normal limits  TROPONIN I (HIGH SENSITIVITY) - Abnormal; Notable for the following components:   Troponin I (High Sensitivity) 19 (*)    All other components  within normal limits  PROTIME-INR  HIV ANTIBODY (ROUTINE TESTING W REFLEX)  BASIC METABOLIC PANEL  CBC  TYPE AND SCREEN  PREPARE RBC (CROSSMATCH)    EKG EKG Interpretation  Date/Time:  Wednesday June 29 2021 16:12:22 EDT Ventricular Rate:  77 PR Interval:  154 QRS Duration: 87 QT Interval:  377 QTC Calculation: 427 R Axis:   29 Text Interpretation: Sinus rhythm Probable left atrial enlargement Probable left ventricular hypertrophy Abnrm T, consider ischemia, anterolateral lds No significant change was found Confirmed by Ezequiel Essex [id number]) on 06/29/2021 4:13:34 PM  Radiology No results found.  Procedures .Critical Care  Performed by: Hayden Rasmussen, MD Authorized by: Hayden Rasmussen, MD   Critical care provider statement:    Critical care time (minutes):  45   Critical care time was exclusive of:  Separately billable procedures and treating other patients   Critical care was necessary to treat or prevent imminent or life-threatening deterioration of the following conditions:  Circulatory failure and shock   Critical care was time spent personally by me on the following activities:  Development of treatment plan with patient or surrogate, discussions with consultants, evaluation of patient's response to treatment, examination of patient, obtaining history from patient or surrogate, ordering and performing treatments and interventions, ordering and review of laboratory studies, ordering and review of radiographic studies, pulse oximetry, re-evaluation of patient's condition and review of old charts   I assumed direction of critical care for this patient from another provider in my specialty: no       Medications Ordered in ED Medications  pantoprozole (PROTONIX) 80 mg /NS 100 mL infusion (8 mg/hr Intravenous New Bag/Given 06/29/21 1157)  doxycycline (VIBRAMYCIN) 100 mg in sodium chloride 0.9 % 250 mL IVPB (has no administration in time range)  metroNIDAZOLE (FLAGYL)  IVPB 500 mg (has no administration in time range)  atorvastatin (LIPITOR) tablet 80 mg (has no administration in time range)  gemfibrozil (LOPID) tablet 600 mg (has no administration in time range)  metoprolol tartrate (LOPRESSOR) tablet 12.5 mg (has no administration in time range)  bismuth subsalicylate (PEPTO BISMOL) chewable tablet 524 mg (has no administration in time range)  lactated ringers infusion (has no administration in time range)  acetaminophen (TYLENOL) tablet 650 mg (has no administration in time range)    Or  acetaminophen (TYLENOL) suppository 650 mg (has no administration in time range)  morphine (PF) 2 MG/ML injection 2 mg (has no administration in time range)  ondansetron (ZOFRAN) tablet 4 mg (has no administration in time range)    Or  ondansetron (ZOFRAN) injection 4 mg (has no administration in time range)  hydrALAZINE (APRESOLINE) injection 5 mg (has no administration in time range)  sodium chloride flush (NS) 0.9 % injection 3 mL (has no administration in time range)  pantoprazole (PROTONIX) 80 mg /NS 100 mL IVPB (0 mg Intravenous Stopped 06/29/21 1237)  ondansetron (ZOFRAN) injection 4 mg (4 mg Intravenous Given 06/29/21 1138)  lactated ringers bolus 1,000 mL (0 mLs Intravenous Stopped 06/29/21 1318)    ED Course/ Medical Decision Making/ A&P Clinical Course as of 06/29/21 1723  Wed Jun 29, 2021  1143 I reached out to [facility] Dr. Lorenso Courier and PA Glyn Ade.  They will see the patient in consult.  IV fluids infusing PPI ordered.  Discharge hemoglobin was 8.6 after multiple transfusions. [MB]  1231 Initial hemoglobin here is 7.9.  Have ordered 2 units of packed red blood cells.  Patient's current blood pressure is 100.  Resting comfortably. [MB]  1310 Patient off the floor and has gone to endoscopy. [MB]    Clinical Course User Index [MB] Hayden Rasmussen, MD                           Medical Decision Making  Amount and/or Complexity of Data Reviewed Labs:  ordered.  Risk Prescription drug management. Decision regarding hospitalization.  This patient complains of hematemesis and hypotension; this involves an extensive number of treatment Options and is a complaint that carries with it a high risk of complications and morbidity. The differential includes GI bleed, shock, gastritis, Mallory-Weiss, hypovolemia  I ordered, reviewed and interpreted labs, which included CBC with normal white count, hemoglobin lower than discharge, chemistries fairly unremarkable with normal BUN and creatinine I ordered medication IV Protonix fluids and IV transfusion of packed red blood cells and reviewed PMP when indicated.  Additional history obtained from patient's family members and EMS Previous records obtained and reviewed in epic including recent endoscopy and discharge summary I consulted Dr. Lorenso Courier gastroenterology and Dr. Lorin Mercy [facility] hospitalist and discussed lab and imaging findings and discussed disposition.  Cardiac monitoring reviewed, normal sinus rhythm Social determinants considered, no significant barriers Critical Interventions: Transfusion of packed red blood cells for hypotension in the setting of GI bleed  After the interventions stated above, I reevaluated the patient and found patient to be symptomatically improved Admission and further testing considered, he will need admission to the hospital after return from endoscopy suite.          Final Clinical Impression(s) / ED Diagnoses Final diagnoses:  Gastrointestinal hemorrhage, unspecified gastrointestinal hemorrhage type    Rx / DC Orders ED Discharge Orders     None         Hayden Rasmussen, MD 06/29/21 1727

## 2021-06-29 NOTE — ED Notes (Signed)
LR fluids are flowing with a pressure bag.

## 2021-06-29 NOTE — Op Note (Signed)
[facility] Patient Name: Raymond [name] Procedure Date : 06/29/2021 MRN: [id number] Attending MD: Georgian Co ,  Date of Birth: 01-07-1961 CSN: [id number] Age: 61 Admit Type: Inpatient Procedure:                Upper GI endoscopy Indications:              Hematemesis, gastric ulcer, H pylori positive Providers:                Adline Mango" Theodis Sato, RN, Gloris Ham, Technician Referring MD:             Hospitalist team Medicines:                Monitored Anesthesia Care Complications:            No immediate complications. Estimated Blood Loss:     Estimated blood loss was minimal. Procedure:                Pre-Anesthesia Assessment:                           - Prior to the procedure, a History and Physical                            was performed, and patient medications and                            allergies were reviewed. The patient's tolerance of                            previous anesthesia was also reviewed. The risks                            and benefits of the procedure and the sedation                            options and risks were discussed with the patient.                            All questions were answered, and informed consent                            was obtained. Prior Anticoagulants: The patient has                            taken antiplatelet medication (Brilinta), last dose                            was 7 days prior to procedure. ASA Grade                            Assessment: III - A patient with severe systemic  disease. After reviewing the risks and benefits,                            the patient was deemed in satisfactory condition to                            undergo the procedure.                           After obtaining informed consent, the endoscope was                            passed under direct vision. Throughout the                             procedure, the patient's blood pressure, pulse, and                            oxygen saturations were monitored continuously. The                            [id number]-[id number] ([id number]) Olympus endoscope was introduced                            through the mouth, and advanced to the second part                            of duodenum. The upper GI endoscopy was                            accomplished without difficulty. The patient                            tolerated the procedure well. Scope In: Scope Out: Findings:      The examined esophagus was normal.      A large amount of food (residue) and hematin was found in the cardia, in       the gastric fundus and in the gastric body.      One non-bleeding cratered gastric ulcer with an adherent clot (Forrest       Class IIb) was found at the incisura. The lesion was 20 mm in largest       dimension. Area was successfully injected with 3 mL of a 1:10,000       solution of epinephrine for hemostasis. For hemostasis, three hemostatic       clips were successful and four hemostatic clips were unsuccessfully       placed. There was difficulty with administration of clips due to the       stiffness of the ulcer bed. There was no bleeding at the end of the       procedure.      The examined duodenum was normal. Impression:               - Normal esophagus.                           -  A large amount of food (residue) in the stomach,                            which limited visualization in the gastric fundus                           - Non-bleeding gastric ulcer with an adherent clot                            (Forrest Class IIb). Injected. Clips were placed.                           - Normal examined duodenum.                           - No specimens collected. Recommendation:           - Return patient to hospital ward for ongoing care.                           - It is suspected that the patient's hematemesis is                            due to  his gastric ulcer. This ulcer was not                            actively bleeding at the time of his endoscopy                            procedure but had stigmata of recent bleeding.                           - Use a proton pump inhibitor IV BID or PPI gtt.                            Patient will need to understand the importance of                            taking this therapy once he leaves the hospital.                           - Avoid all NSAIDs.                           - Would avoid restarting Brilinta at this time                            since patient is at high risk for re-bleeding                           - Patient will need a repeat EGD in 3 months to  assess for healing in the future.                           - If patient were to develop recurrent hematemesis                            in the future, then I would recommend contacting IR                            for consideration of embolization (clips could be                            targeted for therapy).                           - Patient will need to be treated for H pylori in                            the future since this is likely the etiology of his                            stomach ulcer.                           - The findings and recommendations were discussed                            with the patient and/or primary team. Procedure Code(s):        --- Professional ---                           [phone], Esophagogastroduodenoscopy, flexible,                            transoral; with control of bleeding, any method Diagnosis Code(s):        --- Professional ---                           K25.4, Chronic or unspecified gastric ulcer with                            hemorrhage                           K92.0, Hematemesis CPT copyright 2019 [employer]. All rights reserved. The codes documented in this report are preliminary and upon coder review may  be revised  to meet current compliance requirements. [address]Christia Reading,  06/29/2021 3:38:24 PM Number of Addenda: 0

## 2021-06-29 NOTE — ED Notes (Signed)
GI at bedside

## 2021-06-29 NOTE — ED Provider Notes (Signed)
Patient returned to the ED from EGD. Per GI "EGD showed gastric ulcer with adherent clot. I injected Epi and attempted to deploy clips, but the clips would not fully close the ulcer bed because it was so hard. The ulcer is not actively bleeding. He should continue PPI. If he rebleeds, I would talk to IR about embolization. They can target the clips that were deployed. He needs to be treated for H pylori in the future as well."  BP 150s on arrival. C/o some chest tightness. EKG repeated and shows T wave inversions laterally which are unchanged.  Repeat labs including troponin sent.  Discussed with Dr. Lorin Mercy who will admit patient.   Ezequiel Essex, MD 06/29/21 [phone]

## 2021-06-29 NOTE — H&P (Signed)
History and Physical    Patient: Raymond [name] YSA:[id number] DOB: [birth year] DOA: 06/29/2021 DOS: the patient was seen and examined on 06/29/2021 PCP: Bonsu, Osei A, DO  Patient coming from: Home - lives alone; NOK: Daughter, Sherol Dade, [phone]   Chief Complaint: UGI bleed  HPI: Raymond [name] is a 61 y.o. male with medical history significant of HTN, prostate CA s/p radiation, and out of hospital cardiac arrest in 2021 from STEMI requiring PCI (still on ASA/Brilinta) and recent UGI bleed with hospitalization from 6/15-18 returning with GI bleeding.  EGD on 6/15 showed gastritis/esophagitis as well as a large non-bleeding gastric ulcer with adherent clot; given his recent Brilinta there was no intervention performed and so he was at high risk for recurrence.  Brilinta was discontinued.  He was H pylori positive and was given meds for this but has not picked them up.  He also did not pick up Protonix and hasn't been taking PPI.  He did not resume Brilinta and has not been drinking since last admission.  He is sporadic with the other home medications, it seems.  He got up this AM and got dizzy with standing.  He began vomiting bright red blood.  He is now s/p EGD and is feeling ok.  He is not having abdominal pain.  He did have some chest discomfort post-procedure, none currently.    ER Course:   Discharged 3 days ago, didn't take PPI, vomited a "bathtub full of blood" with near syncope, BP in 70s -> 100.  Hgb 7.9, down from 8.6.  GI messaged, given Protonix, blood.  In endoscopy now.       Review of Systems: As mentioned in the history of present illness. All other systems reviewed and are negative. Past Medical History:  Diagnosis Date   Acute gastric ulcer with hemorrhage 06/28/2021   Acute respiratory failure (HCC)    AKI (acute kidney injury) ([place])    Arthritis    At risk for sleep apnea    STOP-BANG= 4    SENT TO PCP 07-09-2014   BPH (benign prostatic hypertrophy)    Cardiac  arrest ([place]) 06/14/2019   Dental caries    periodontitis   Depression    Elevated PSA    Helicobacter pylori gastritis 06/28/2021   History of concussion    yrs ago-- no LOC-- no residual   History of depression    Hypertension    Nocturia    Prostate cancer ([place])    Wears glasses    Past Surgical History:  Procedure Laterality Date   APPENDECTOMY  age 42   BIOPSY  06/23/2021   Procedure: BIOPSY;  Surgeon: [name], MD;  Location: [facility];  Service: Gastroenterology;;   CORONARY STENT INTERVENTION N/A 06/14/2019   Procedure: CORONARY STENT INTERVENTION;  Surgeon: [name], MD;  Location: [place] [facility];  Service: Cardiovascular;  Laterality: N/A;   CORONARY/GRAFT ACUTE MI REVASCULARIZATION N/A 06/14/2019   Procedure: Coronary/Graft Acute MI Revascularization;  Surgeon: [name], MD;  Location: [place] [facility];  Service: Cardiovascular;  Laterality: N/A;   CYSTOSCOPY WITH URETHRAL DILATATION N/A 06/15/2019   Procedure: CYSTOSCOPY WITH BALLOON URETHRAL DILATATION;  Surgeon: [name], MD;  Location: [place];  Service: Urology;  Laterality: N/A;   ESOPHAGOGASTRODUODENOSCOPY (EGD) WITH PROPOFOL N/A 06/23/2021   Procedure: ESOPHAGOGASTRODUODENOSCOPY (EGD) WITH PROPOFOL;  Surgeon: [name], MD;  Location: [place];  Service: Gastroenterology;  Laterality: N/A;   LEFT HEART CATH  AND CORONARY ANGIOGRAPHY N/A 06/14/2019   Procedure: LEFT HEART CATH AND CORONARY ANGIOGRAPHY;  Surgeon: [name], MD;  Location: [place] [facility];  Service: Cardiovascular;  Laterality: N/A;   MULTIPLE EXTRACTIONS WITH ALVEOLOPLASTY N/A 10/12/2017   Procedure: MULTIPLE EXTRACTION WITH ALVEOLOPLASTY;  Surgeon: [name], DDS;  Location: [place];  Service: Oral Surgery;  Laterality: N/A;   PELVIC LYMPH NODE DISSECTION Bilateral 11/04/2018   Procedure: PELVIC LYMPH NODE DISSECTION;  Surgeon: [name], MD;  Location: [facility];  Service: Urology;  Laterality:  Bilateral;   PROSTATE BIOPSY N/A 07/15/2014   Procedure: SATURATION BIOPSY TRANSRECTAL ULTRASONIC PROSTATE (TUBP);  Surgeon: [name], MD;  Location[facility];  Service: Urology;  Laterality: N/A;   PROSTATE BIOPSY N/A 08/07/2018   Procedure: BIOPSY TRANSRECTAL ULTRASONIC PROSTATE (TUBP);  Surgeon: [name], MD;  Location: [facility];  Service: Urology;  Laterality: N/A;  ONLY NEEDS 30 MIN   RIGHT KNEE ARTHROSCOPY /  MENISECTOMY/  DEBRIDEMENT OF CHONDROMALACIA PATELLA AND MEDIAL SHELF PLICA/  SUBCHONDROPLASTY  06-10-2010   ROBOT ASSISTED LAPAROSCOPIC RADICAL PROSTATECTOMY N/A 11/04/2018   Procedure: XI ROBOTIC ASSISTED LAPAROSCOPIC RADICAL PROSTATECTOMY;  Surgeon: [name], MD;  Location: [facility];  Service: Urology;  Laterality: N/A;   TOTAL KNEE ARTHROPLASTY  05/31/2011   Procedure: TOTAL KNEE ARTHROPLASTY;  Surgeon: [name], MD;  Location: [place];  Service: Orthopedics;  Laterality: Right;  with revision stem   Social History:  reports that he quit smoking about 2 years ago. His smoking use included cigarettes. He has a 2.00 pack-year smoking history. He has never used smokeless tobacco. He reports current alcohol use. He reports that he does not use drugs.  No Known Allergies  Family History  Problem Relation Age of Onset   Prostate cancer Neg Hx    Colon cancer Neg Hx    Breast cancer Neg Hx     Prior to Admission medications   Medication Sig Start Date End Date Taking? Authorizing Provider  acetaminophen (TYLENOL) 500 MG tablet Take 500 mg by mouth daily as needed for headache or mild pain.    [provider]  aspirin 81 MG chewable tablet Chew 1 tablet (81 mg total) by mouth daily. 06/28/21   Thurnell Lose, MD  atorvastatin (LIPITOR) 80 MG tablet Take 1 tablet (80 mg total) by mouth daily. 06/21/19   Mercy Riding, MD  bismuth subsalicylate (PEPTO-BISMOL) 262 MG chewable tablet Chew 2 tablets (524 mg total)  by mouth QID for 14 days. 06/29/21 07/13/21  [name], MD  doxycycline (VIBRAMYCIN) 50 MG capsule Take 2 capsules (100 mg total) by mouth 2 (two) times daily for 14 days. 06/29/21 07/13/21  [name], MD  gemfibrozil (LOPID) 600 MG tablet Take 600 mg by mouth daily. 09/26/17   [provider]  Glycerin-Hypromellose-PEG 400 (VISINE DRY EYE OP) Place 1 drop into both eyes 2 (two) times daily as needed (dry eyes, redness).    [provider]  lisinopril (ZESTRIL) 20 MG tablet Take 20 mg by mouth daily. 04/30/21   [provider]  metoprolol tartrate (LOPRESSOR) 25 MG tablet Take 25 mg by mouth daily.    [provider]  metroNIDAZOLE (FLAGYL) 250 MG tablet Take 1 tablet (250 mg total) by mouth 4 (four) times daily for 14 days. 06/29/21 07/13/21  [name], MD  pantoprazole (PROTONIX) 40 MG tablet Take 1 tablet (40 mg total) by  mouth 2 (two) times daily before a meal. 06/26/21   Thurnell Lose, MD  potassium chloride (KLOR-CON) 10 MEQ tablet TAKE 1 TABLET(10 MEQ) BY MOUTH 1 TIME FOR 1 DOSE Patient not taking: Reported on 06/23/2021 12/08/20   Sherren Mocha, MD    Physical Exam: Vitals:   06/29/21 1730 06/29/21 1745 06/29/21 1800 06/29/21 1806  BP: 129/79 128/77 118/76 134/76  Pulse: 72 72 71 73  Resp: '13 16 18 18  '$ Temp:    98.7 F (37.1 C)  TempSrc:      SpO2: 100% 99% 98% 100%  Weight:      Height:       General:  Appears calm and comfortable and is in NAD Eyes:   EOMI, normal lids, iris ENT:  grossly normal hearing, lips & tongue, mmm Neck:  no LAD, masses or thyromegaly Cardiovascular:  RRR, no m/r/g. No LE edema.  Respiratory:   CTA bilaterally with no wheezes/rales/rhonchi.  Normal respiratory effort. Abdomen:  soft, NT, ND Skin:  no rash or induration seen on limited exam Musculoskeletal:  grossly normal tone BUE/BLE, good ROM, no bony abnormality Psychiatric:  grossly normal mood and affect, speech fluent and appropriate,  AOx3 Neurologic:  CN 2-12 grossly intact, moves all extremities in coordinated fashion   Radiological Exams on Admission: Independently reviewed - see discussion in A/P where applicable  No results found.  EKG: Independently reviewed.   1128 -NSR with rate 65; no evidence of acute ischemia 1612 -NSR with rate 77; nonspecific ST changes NSCSLT   Labs on Admission: I have personally reviewed the available labs and imaging studies at the time of the admission.  Pertinent labs:    Glucose 142 Albumin 2.8 HS troponin 19 WBC 9.3 -> 11.3 Hgb stable from prior   Assessment and Plan: Principal Problem:   Acute upper GI bleeding Active Problems:   Alcohol abuse   Essential hypertension   Prostate cancer (HCC)   Helicobacter pylori gastritis   Chronic combined systolic (congestive) and diastolic (congestive) heart failure (HCC)   Dyslipidemia    Acute Upper GI Bleeding -Patient with recent admission for UGI bleeding is presenting with  hematemesis -Prior EGD with large gastric ulcer, unable to treat due to Brilinta and so at high risk for re-bleeding -He also was H pylori positive -He did not yet pick up his PPI or H pylori meds -Recurrent bleeding today -He was taken for urgent EGD which showed a non-bleeding gastric ulcer with adherent clot that was injected with clip placement -Will admit to progressive bed  -GI consulting -Clear liquids for now -NS at 100 mL/hr -Start IV pantoprazole bolus and infusion given frank bleeding with concern for hemodynamic instability -Zofran IV for nausea -Avoid NSAIDs and SQ heparin -Maintain IV access (2 large bore IVs if possible). -Hold ASA for now He needs a repeat EGD in 3 months -If he rebleeds in the future, GI recommends IR consultation for embolization -Will start IV treatment for H pylori for now and he can transition to PO when improved  ABLA -Hgb was actually fairly stable despite rebleeding -Two units of blood were  ordered by ED.  -recheck CBC daily, sooner if he bleeds again   CAD -S/p cardiac arrest and then PCI with stent placement 2 years ago -No longer needs Brilinta so this has been stopped -ASA is on hold with recurrent bleeding   Chronic combined systolic and diastolic heart failure  -5/46/27 echo with EF 45-50% and grade 1 diastolic dysfunction -  Appears compensated at this time -Will need to monitor for volume overload in the setting of blood transfusion  HTN -Continue Lopressor but this is short-acting metoprolol and needs to be given twice daily (has been taking once) -Hold lisinopril due to hypotension on presentation, improved  HLD -Continue atorvastatin, gemfibrozil   History of prostate cancer -s/p radical prostatectomy for stage 3 CA -Completed radiation therapy in 07/2019 -Needs ongoing outpatient f/u  Alcohol abuse -Reports no drinking since prior to last hospitalization -Denies withdrawal symptoms -Also with h/o cocaine abuse; will check UDS    Advance Care Planning:   Code Status: Full Code   Consults: GI  DVT Prophylaxis: SCDs  Family Communication: 2 daughters were present throughout evaluation  Severity of Illness: The appropriate patient status for this patient is INPATIENT. Inpatient status is judged to be reasonable and necessary in order to provide the required intensity of service to ensure the patient's safety. The patient's presenting symptoms, physical exam findings, and initial radiographic and laboratory data in the context of their chronic comorbidities is felt to place them at high risk for further clinical deterioration. Furthermore, it is not anticipated that the patient will be medically stable for discharge from the hospital within 2 midnights of admission.   * I certify that at the point of admission it is my clinical judgment that the patient will require inpatient hospital care spanning beyond 2 midnights from the point of admission due to high  intensity of service, high risk for further deterioration and high frequency of surveillance required.*  Author: Karmen Bongo, MD 06/29/2021 6:49 PM  For on call review [email or web address].

## 2021-06-30 DIAGNOSIS — K922 Gastrointestinal hemorrhage, unspecified: Secondary | ICD-10-CM | POA: Diagnosis not present

## 2021-06-30 LAB — TYPE AND SCREEN
ABO/RH(D): A NEG
Antibody Screen: NEGATIVE
Unit division: 0
Unit division: 0

## 2021-06-30 LAB — BPAM RBC
Blood Product Expiration Date: 202307022359
Blood Product Expiration Date: 202307102359
ISSUE DATE / TIME: 202306211255
ISSUE DATE / TIME: 202306211634
Unit Type and Rh: 600
Unit Type and Rh: 600

## 2021-06-30 LAB — BASIC METABOLIC PANEL WITH GFR
Anion gap: 11 (ref 5–15)
BUN: 9 mg/dL (ref 8–23)
CO2: 23 mmol/L (ref 22–32)
Calcium: 8.8 mg/dL — ABNORMAL LOW (ref 8.9–10.3)
Chloride: 109 mmol/L (ref 98–111)
Creatinine, Ser: 0.99 mg/dL (ref 0.61–1.24)
GFR, Estimated: >60 mL/min
Glucose, Bld: 118 mg/dL — ABNORMAL HIGH (ref 70–99)
Potassium: 3.9 mmol/L (ref 3.5–5.1)
Sodium: 143 mmol/L (ref 135–145)

## 2021-06-30 LAB — CBC
HCT: 26.6 % — ABNORMAL LOW (ref 39.0–52.0)
Hemoglobin: 8.8 g/dL — ABNORMAL LOW (ref 13.0–17.0)
MCH: 27.5 pg (ref 26.0–34.0)
MCHC: 33.1 g/dL (ref 30.0–36.0)
MCV: 83.1 fL (ref 80.0–100.0)
Platelets: 270 10*3/uL (ref 150–400)
RBC: 3.2 MIL/uL — ABNORMAL LOW (ref 4.22–5.81)
RDW: 17.5 % — ABNORMAL HIGH (ref 11.5–15.5)
WBC: 10.8 10*3/uL — ABNORMAL HIGH (ref 4.0–10.5)
nRBC: 0 % (ref 0.0–0.2)

## 2021-06-30 LAB — MRSA NEXT GEN BY PCR, NASAL: MRSA by PCR Next Gen: NOT DETECTED

## 2021-06-30 LAB — PROTIME-INR
INR: 1.1 (ref 0.8–1.2)
Prothrombin Time: 13.7 s (ref 11.4–15.2)

## 2021-06-30 MED ORDER — ASPIRIN 81 MG PO CHEW
81.0000 mg | CHEWABLE_TABLET | Freq: Every day | ORAL | Status: DC
  Administered 2021-07-01 – 2021-07-02 (×2): 81 mg via ORAL
  Filled 2021-06-30 (×2): qty 1

## 2021-06-30 MED ORDER — ASPIRIN 81 MG PO CHEW: 81.0000 mg | CHEWABLE_TABLET | Freq: Every day | ORAL | Status: DC

## 2021-06-30 NOTE — ED Notes (Signed)
Pt resting comfortably, even rise and fall of chest noted

## 2021-06-30 NOTE — ED Notes (Signed)
ED TO INPATIENT HANDOFF REPORT    S Name/Age/Gender Raymond [name] 61 y.o. male Room/Bed: TRAAC/TRAAC  Code Status   Code Status: Full Code  Home/SNF/Other Home Patient oriented to: self, place, time, and situation Is this baseline? Yes   Triage Complete: Triage complete  Chief Complaint Acute upper GI bleeding [K92.2]  Triage Note Pt BIB [facility] from home d/t GI Bleed from a gastric ulcer he was diagnosed with 2 days ago. He was advised by his PCP to stop his Brilinta recently. Has been having lower abd pain, n/v/d with bloody emesis that had dark clots in it. EMS reports upon their arrival he was pale, diaphoretic & gray in color with initial SBP of 50. After 350 cc of LR fluid bolus in a 20g PIV in Lt AC his SBP was 80, pulse was in the 80's, A/Ox4.   Allergies No Known Allergies  Level of Care/Admitting Diagnosis ED Disposition     ED Disposition  Admit   Condition  --   Comment  Hospital Area: [place] [[id number]]  Level of Care: Progressive [102]  Admit to Progressive based on following criteria: GI, ENDOCRINE disease patients with GI bleeding, acute liver failure or pancreatitis, stable with diabetic ketoacidosis or thyrotoxicosis (hypothyroid) state.  May admit patient to [facility] or [facility] if equivalent level of care is available:: Yes  Covid Evaluation: Asymptomatic - no recent exposure (last 10 days) testing not required  Diagnosis: Acute upper GI bleeding [294765]  Admitting Physician: Karmen Bongo [[id number]]  Attending Physician: Karmen Bongo [[id number]]  Estimated length of stay: 3 - 4 days  Certification:: I certify this patient will need inpatient services for at least 2 midnights          B Medical/Surgery History Past Medical History:  Diagnosis Date   Acute gastric ulcer with hemorrhage 06/28/2021   Acute respiratory failure ([place])    AKI (acute kidney injury) ([place])    Arthritis    At risk for sleep apnea    STOP-BANG= 4     SENT TO PCP 07-09-2014   BPH (benign prostatic hypertrophy)    Cardiac arrest ([place]) 06/14/2019   Dental caries    periodontitis   Depression    Elevated PSA    Helicobacter pylori gastritis 06/28/2021   History of concussion    yrs ago-- no LOC-- no residual   History of depression    Hypertension    Nocturia    Prostate cancer ([place])    Wears glasses    Past Surgical History:  Procedure Laterality Date   APPENDECTOMY  age 50   BIOPSY  06/23/2021   Procedure: BIOPSY;  Surgeon: [name], MD;  Location: [facility] ENDOSCOPY;  Service: Gastroenterology;;   CORONARY STENT INTERVENTION N/A 06/14/2019   Procedure: CORONARY STENT INTERVENTION;  Surgeon: [name], MD;  Location: [place] [facility] LAB;  Service: Cardiovascular;  Laterality: N/A;   CORONARY/GRAFT ACUTE MI REVASCULARIZATION N/A 06/14/2019   Procedure: Coronary/Graft Acute MI Revascularization;  Surgeon: [name], MD;  Location: [place] [facility];  Service: Cardiovascular;  Laterality: N/A;   CYSTOSCOPY WITH URETHRAL DILATATION N/A 06/15/2019   Procedure: CYSTOSCOPY WITH BALLOON URETHRAL DILATATION;  Surgeon: [name], MD;  Location: [place];  Service: Urology;  Laterality: N/A;   ESOPHAGOGASTRODUODENOSCOPY (EGD) WITH PROPOFOL N/A 06/23/2021   Procedure: ESOPHAGOGASTRODUODENOSCOPY (EGD) WITH PROPOFOL;  Surgeon: [name], MD;  Location: [place];  Service: Gastroenterology;  Laterality: N/A;   ESOPHAGOGASTRODUODENOSCOPY (  EGD) WITH PROPOFOL N/A 06/29/2021   Procedure: ESOPHAGOGASTRODUODENOSCOPY (EGD) WITH PROPOFOL;  Surgeon: Sharyn Creamer, MD;  Location: [place];  Service: Gastroenterology;  Laterality: N/A;   HEMOSTASIS CLIP PLACEMENT  06/29/2021   Procedure: HEMOSTASIS CLIP PLACEMENT;  Surgeon: Sharyn Creamer, MD;  Location: [facility] ENDOSCOPY;  Service: Gastroenterology;;   LEFT HEART CATH AND CORONARY ANGIOGRAPHY N/A 06/14/2019   Procedure: LEFT HEART CATH AND CORONARY ANGIOGRAPHY;  Surgeon: [name],  MD;  Location: [place] [facility];  Service: Cardiovascular;  Laterality: N/A;   MULTIPLE EXTRACTIONS WITH ALVEOLOPLASTY N/A 10/12/2017   Procedure: MULTIPLE EXTRACTION WITH ALVEOLOPLASTY;  Surgeon: [name], DDS;  Location: [place];  Service: Oral Surgery;  Laterality: N/A;   PELVIC LYMPH NODE DISSECTION Bilateral 11/04/2018   Procedure: PELVIC LYMPH NODE DISSECTION;  Surgeon: [name], MD;  Location: [facility];  Service: Urology;  Laterality: Bilateral;   PROSTATE BIOPSY N/A 07/15/2014   Procedure: SATURATION BIOPSY TRANSRECTAL ULTRASONIC PROSTATE (TUBP);  Surgeon: [name], MD;  Location: [facility];  Service: Urology;  Laterality: N/A;   PROSTATE BIOPSY N/A 08/07/2018   Procedure: BIOPSY TRANSRECTAL ULTRASONIC PROSTATE (TUBP);  Surgeon: [name], MD;  Location: [facility];  Service: Urology;  Laterality: N/A;  ONLY NEEDS 30 MIN   RIGHT KNEE ARTHROSCOPY /  MENISECTOMY/  DEBRIDEMENT OF CHONDROMALACIA PATELLA AND MEDIAL SHELF PLICA/  SUBCHONDROPLASTY  06-10-2010   ROBOT ASSISTED LAPAROSCOPIC RADICAL PROSTATECTOMY N/A 11/04/2018   Procedure: XI ROBOTIC ASSISTED LAPAROSCOPIC RADICAL PROSTATECTOMY;  Surgeon: [name], MD;  Location: [facility];  Service: Urology;  Laterality: N/A;   SCHLEROTHERAPY  06/29/2021   Procedure: SCHLEROTHERAPy;  Surgeon: Sharyn Creamer, MD;  Location: [facility] ENDOSCOPY;  Service: Gastroenterology;;   TOTAL KNEE ARTHROPLASTY  05/31/2011   Procedure: TOTAL KNEE ARTHROPLASTY;  Surgeon: [name], MD;  Location: [place];  Service: Orthopedics;  Laterality: Right;  with revision stem     A IV Location/Drains/Wounds Patient Lines/Drains/Airways Status     Active Line/Drains/Airways     Name Placement date Placement time Site Days   Peripheral IV 06/29/21 20 G Left Antecubital 06/29/21  1123  Antecubital  1   Peripheral IV 06/29/21 20 G Left;Posterior Hand 06/29/21  1402  Hand  1   Incision 05/31/11 Knee Right  05/31/11  1105  -- 3683   Incision (Closed) 07/15/14 N/A Other (Comment) 07/15/14  1003  -- 2542   Incision (Closed) 08/07/18 N/A 08/07/18  1138  -- 1058   Incision (Closed) 06/15/19 Penis 06/15/19  1009  -- 746   Incision - 6 Ports Abdomen 1: Left;Lateral;Lower 2: Left;Lateral;Upper 3: Umbilicus;Superior 4: Right;Lateral;Lower 5: Right;Medial;Upper 6: Right;Lateral;Upper 11/04/18  0820  -- 969            Intake/Output Last 24 hours  Intake/Output Summary (Last 24 hours) at 06/30/2021 0741 Last data filed at 06/30/2021 0559 Gross per 24 hour  Intake 750 ml  Output 800 ml  Net -50 ml    Labs/Imaging Results for orders placed or performed during the hospital encounter of 06/29/21 (from the past 48 hour(s))  Comprehensive metabolic panel     Status: Abnormal   Collection Time: 06/29/21 11:35 AM  Result Value Ref Range   Sodium 141 135 - 145 mmol/L   Potassium 4.1 3.5 - 5.1 mmol/L   Chloride 111 98 - 111 mmol/L   CO2 22 22 - 32 mmol/L   Glucose, Bld 142 (H) 70 - 99  mg/dL    Comment: Glucose reference range applies only to samples taken after fasting for at least 8 hours.   BUN 13 8 - 23 mg/dL   Creatinine, Ser 1.17 0.61 - 1.24 mg/dL   Calcium 8.8 (L) 8.9 - 10.3 mg/dL   Total Protein 5.1 (L) 6.5 - 8.1 g/dL   Albumin 2.8 (L) 3.5 - 5.0 g/dL   AST 19 15 - 41 U/L   ALT 10 0 - 44 U/L   Alkaline Phosphatase 31 (L) 38 - 126 U/L   Total Bilirubin 0.3 0.3 - 1.2 mg/dL   GFR, Estimated >60 >60 mL/min    Comment: (NOTE) Calculated using the CKD-EPI Creatinine Equation (2021)    Anion gap 8 5 - 15    Comment: Performed at [facility], [place] [address] [place], [place] [address]  CBC with Differential     Status: Abnormal   Collection Time: 06/29/21 11:35 AM  Result Value Ref Range   WBC 9.3 4.0 - 10.5 K/uL   RBC 3.13 (L) 4.22 - 5.81 MIL/uL   Hemoglobin 7.9 (L) 13.0 - 17.0 g/dL   HCT 27.0 (L) 39.0 - 52.0 %   MCV 86.3 80.0 - 100.0 fL   MCH 25.2 (L) 26.0 - 34.0 pg   MCHC 29.3  (L) 30.0 - 36.0 g/dL   RDW 18.6 (H) 11.5 - 15.5 %   Platelets 241 150 - 400 K/uL    Comment: REPEATED TO VERIFY   nRBC 0.0 0.0 - 0.2 %   Neutrophils Relative % 79 %   Neutro Abs 7.3 1.7 - 7.7 K/uL   Lymphocytes Relative 12 %   Lymphs Abs 1.1 0.7 - 4.0 K/uL   Monocytes Relative 8 %   Monocytes Absolute 0.8 0.1 - 1.0 K/uL   Eosinophils Relative 1 %   Eosinophils Absolute 0.1 0.0 - 0.5 K/uL   Basophils Relative 0 %   Basophils Absolute 0.0 0.0 - 0.1 K/uL   Immature Granulocytes 0 %   Abs Immature Granulocytes 0.04 0.00 - 0.07 K/uL    Comment: Performed at [facility], [place] [address] [place], [place] [address]  Type and screen [place]     Status: None (Preliminary result)   Collection Time: 06/29/21 11:35 AM  Result Value Ref Range   ABO/RH(D) A NEG    Antibody Screen NEG    Sample Expiration 07/02/2021,2359    Unit Number [id number]    Blood Component Type RED CELLS,LR    Unit division 00    Status of Unit ISSUED    Transfusion Status OK TO TRANSFUSE    Crossmatch Result      Compatible Performed at [facility], [place] [address] [place], [place] [address]    Unit Number [id number]    Blood Component Type RED CELLS,LR    Unit division 00    Status of Unit ISSUED    Transfusion Status OK TO TRANSFUSE    Crossmatch Result Compatible   Prepare RBC (crossmatch)     Status: None   Collection Time: 06/29/21  1:00 PM  Result Value Ref Range   Order Confirmation      ORDER PROCESSED BY BLOOD BANK Performed at [facility], [place] [address], [place], [place] [address]   Troponin I (High Sensitivity)     Status: Abnormal   Collection Time: 06/29/21  4:24 PM  Result Value Ref Range   Troponin I (High Sensitivity) 19 (H) <18  ng/L    Comment: (NOTE) Elevated high sensitivity troponin I (hsTnI) values and significant  changes across serial measurements may suggest ACS but many other  chronic and acute conditions are known to elevate  hsTnI results.  Refer to the "Links" section for chest pain algorithms and additional  guidance. Performed at [facility], [place] [address], [place], [place] [address]   CBC with Differential     Status: Abnormal   Collection Time: 06/29/21  4:24 PM  Result Value Ref Range   WBC 11.3 (H) 4.0 - 10.5 K/uL   RBC 3.09 (L) 4.22 - 5.81 MIL/uL   Hemoglobin 8.4 (L) 13.0 - 17.0 g/dL   HCT 27.6 (L) 39.0 - 52.0 %   MCV 89.3 80.0 - 100.0 fL   MCH 27.2 26.0 - 34.0 pg   MCHC 30.4 30.0 - 36.0 g/dL   RDW 17.9 (H) 11.5 - 15.5 %   Platelets 231 150 - 400 K/uL    Comment: REPEATED TO VERIFY   nRBC 0.0 0.0 - 0.2 %   Neutrophils Relative % 92 %   Neutro Abs 10.4 (H) 1.7 - 7.7 K/uL   Lymphocytes Relative 3 %   Lymphs Abs 0.4 (L) 0.7 - 4.0 K/uL   Monocytes Relative 4 %   Monocytes Absolute 0.4 0.1 - 1.0 K/uL   Eosinophils Relative 0 %   Eosinophils Absolute 0.0 0.0 - 0.5 K/uL   Basophils Relative 0 %   Basophils Absolute 0.0 0.0 - 0.1 K/uL   Immature Granulocytes 1 %   Abs Immature Granulocytes 0.09 (H) 0.00 - 0.07 K/uL    Comment: Performed at [place] [address] [place], [place] [address]  Troponin I (High Sensitivity)     Status: Abnormal   Collection Time: 06/29/21  9:10 PM  Result Value Ref Range   Troponin I (High Sensitivity) 21 (H) <18 ng/L    Comment: (NOTE) Elevated high sensitivity troponin I (hsTnI) values and significant  changes across serial measurements may suggest ACS but many other  chronic and acute conditions are known to elevate hsTnI results.  Refer to the "Links" section for chest pain algorithms and additional  guidance. Performed at [facility], [place] [address] [place], [place] [address]   Rapid urine drug screen (hospital performed)     Status: None   Collection Time: 06/29/21  9:43 PM  Result Value Ref Range   Opiates NONE DETECTED NONE DETECTED   Cocaine NONE DETECTED NONE DETECTED   Benzodiazepines NONE DETECTED NONE DETECTED    Amphetamines NONE DETECTED NONE DETECTED   Tetrahydrocannabinol NONE DETECTED NONE DETECTED   Barbiturates NONE DETECTED NONE DETECTED    Comment: (NOTE) DRUG SCREEN FOR MEDICAL PURPOSES ONLY.  IF CONFIRMATION IS NEEDED FOR ANY PURPOSE, NOTIFY LAB WITHIN 5 DAYS.  LOWEST DETECTABLE LIMITS FOR URINE DRUG SCREEN Drug Class                     Cutoff (ng/mL) Amphetamine and metabolites    1000 Barbiturate and metabolites    200 Benzodiazepine                 175 Tricyclics and metabolites     300 Opiates and metabolites        300 Cocaine and metabolites        300 THC  50 Performed at [facility], [place] [address], [place], [place] [address]   Basic metabolic panel     Status: Abnormal   Collection Time: 06/30/21  5:17 AM  Result Value Ref Range   Sodium 143 135 - 145 mmol/L   Potassium 3.9 3.5 - 5.1 mmol/L   Chloride 109 98 - 111 mmol/L   CO2 23 22 - 32 mmol/L   Glucose, Bld 118 (H) 70 - 99 mg/dL    Comment: Glucose reference range applies only to samples taken after fasting for at least 8 hours.   BUN 9 8 - 23 mg/dL   Creatinine, Ser 0.99 0.61 - 1.24 mg/dL   Calcium 8.8 (L) 8.9 - 10.3 mg/dL   GFR, Estimated >60 >60 mL/min    Comment: (NOTE) Calculated using the CKD-EPI Creatinine Equation (2021)    Anion gap 11 5 - 15    Comment: Performed at [facility] [address], [place], [place] [address]  CBC     Status: Abnormal   Collection Time: 06/30/21  5:17 AM  Result Value Ref Range   WBC 10.8 (H) 4.0 - 10.5 K/uL   RBC 3.20 (L) 4.22 - 5.81 MIL/uL   Hemoglobin 8.8 (L) 13.0 - 17.0 g/dL   HCT 26.6 (L) 39.0 - 52.0 %   MCV 83.1 80.0 - 100.0 fL   MCH 27.5 26.0 - 34.0 pg   MCHC 33.1 30.0 - 36.0 g/dL   RDW 17.5 (H) 11.5 - 15.5 %   Platelets 270 150 - 400 K/uL   nRBC 0.0 0.0 - 0.2 %    Comment: Performed at [facility], [place] [address] [name], [place] [address]  Protime-INR     Status: None   Collection Time: 06/30/21  5:17 AM   Result Value Ref Range   Prothrombin Time 13.7 11.4 - 15.2 seconds   INR 1.1 0.8 - 1.2    Comment: (NOTE) INR goal varies based on device and disease states. Performed at [facility], [place] [address], [place], [place] [address]    No results found.  Pending Labs Unresulted Labs (From admission, onward)     Start     Ordered   06/29/21 1719  HIV Antibody (routine testing w rflx)  (HIV Antibody (Routine testing w reflex) panel)  Once,   R        06/29/21 1718            Vitals/Pain Today's Vitals   06/30/21 0630 06/30/21 0645 06/30/21 0700 06/30/21 0715  BP: 130/72 122/76 134/80 127/77  Pulse: 72 76 72 65  Resp: (!) '21 15 14 16  '$ Temp:      TempSrc:      SpO2: 97% 99% 100% 100%  Weight:      Height:      PainSc:        Isolation Precautions No active isolations  Medications Medications  pantoprozole (PROTONIX) 80 mg /NS 100 mL infusion (8 mg/hr Intravenous New Bag/Given 06/29/21 2244)  doxycycline (VIBRAMYCIN) 100 mg in sodium chloride 0.9 % 250 mL IVPB (0 mg Intravenous Stopped 06/29/21 2113)  metroNIDAZOLE (FLAGYL) IVPB 500 mg (0 mg Intravenous Stopped 06/29/21 1954)  atorvastatin (LIPITOR) tablet 80 mg (has no administration in time range)  gemfibrozil (LOPID) tablet 600 mg (600 mg Oral Given 06/29/21 1853)  metoprolol tartrate (LOPRESSOR) tablet 12.5 mg (12.5 mg Oral Given 06/29/21 2956)  bismuth subsalicylate (PEPTO BISMOL) chewable tablet 524 mg (524 mg  Oral Given 06/30/21 0555)  lactated ringers infusion ( Intravenous New Bag/Given 06/29/21 1814)  acetaminophen (TYLENOL) tablet 650 mg (has no administration in time range)    Or  acetaminophen (TYLENOL) suppository 650 mg (has no administration in time range)  morphine (PF) 2 MG/ML injection 2 mg (has no administration in time range)  ondansetron (ZOFRAN) tablet 4 mg (has no administration in time range)    Or  ondansetron (ZOFRAN) injection 4 mg (has no administration in time range)  hydrALAZINE  (APRESOLINE) injection 5 mg (has no administration in time range)  sodium chloride flush (NS) 0.9 % injection 3 mL (3 mLs Intravenous Given 06/29/21 2240)  pantoprazole (PROTONIX) 80 mg /NS 100 mL IVPB (0 mg Intravenous Stopped 06/29/21 1237)  ondansetron (ZOFRAN) injection 4 mg (4 mg Intravenous Given 06/29/21 1138)  lactated ringers bolus 1,000 mL (0 mLs Intravenous Stopped 06/29/21 1318)    Mobility walks Low fall risk   Focused Assessments    R Recommendations: See Admitting Provider Note

## 2021-06-30 NOTE — Anesthesia Postprocedure Evaluation (Signed)
Anesthesia Post Note  Patient: Raymond [name]  Procedure(s) Performed: ESOPHAGOGASTRODUODENOSCOPY (EGD) WITH PROPOFOL HEMOSTASIS CLIP PLACEMENT SCHLEROTHERAPy     Patient location during evaluation: PACU Anesthesia Type: General Level of consciousness: awake and alert Pain management: pain level controlled Vital Signs Assessment: post-procedure vital signs reviewed and stable Respiratory status: spontaneous breathing, nonlabored ventilation, respiratory function stable and patient connected to nasal cannula oxygen Cardiovascular status: blood pressure returned to baseline and stable Postop Assessment: no apparent nausea or vomiting Anesthetic complications: no   No notable events documented.  Last Vitals:  Vitals:   06/30/21 0715 06/30/21 0730  BP: 127/77 133/72  Pulse: 65 70  Resp: 16 19  Temp:    SpO2: 100% 99%    Last Pain:  Vitals:   06/30/21 0140  TempSrc:   PainSc: Asleep                 [name]

## 2021-06-30 NOTE — TOC Progression Note (Signed)
Transition of Care [facility]) - Progression Note    Patient Details  Name: Raymond [name] MRN: [id number] Date of Birth: May 30, 1960  Transition of Care [facility]) CM/SW [place], RN Phone [phone]  06/30/2021, 3:28 PM  Clinical Narrative:     Transition of Care [facility]) Screening Note   Patient Details  Name: Raymond [name] Date of Birth: [birth year]   Transition of Care [facility]) CM/SW Contact:    Angelita Ingles, RN Phone Number: 06/30/2021, 3:28 PM    Transition of Care Department [facility]) has reviewed patient and no TOC needs have been identified at this time. We will continue to monitor patient advancement through interdisciplinary progression rounds.           Expected Discharge Plan and Services                                                 Social Determinants of Health (SDOH) Interventions    Readmission Risk Interventions     No data to display

## 2021-06-30 NOTE — Progress Notes (Signed)
PROGRESS NOTE   Raymond [name]  UMP:[id number] DOB: [birth year] DOA: 06/29/2021 PCP: Gevena Mart, DO  Brief Narrative:  61 year old black male V-fib cardiac arrest 06/14/2019 + PCI on aspirin/Brilinta-intubated at the time for respiratory failure/aspiration Resulting mild anoxic encephalopathy Prostate cancer status post XRT and robotic lap assisted prostatectomy with lymph node dissection 11/04/2018 Prior right total knee repair 2013  Admission 6/14-6/18 for hematemesis found to have nonbleeding gastric ulcer with adherent clot on endoscopy-received 2 units PRBC--this was not intervened on--he was kept n.p.o.  for a period of Time and observed in the hospital and ultimately discharged had a hemoglobin of 8.9 after discussion with cardiology about discontinuation of DAPT-was told to resume baby aspirin 1 week He did not pick up his Protonix after discharge  Represented 06/29/2021 no abdominal pain nausea vomiting diarrhea bloody emesis and dark clots initial systolic blood pressure in the 50s Received 350 cc LR bolus Hemoglobin found to be 7.9 platelet 241 BUN/creatinine 13/1.1 Transfuse 1 unit PRBC  Urgent endoscopy 6/21 showed normal esophagus with large amount of food residue and nonbleeding gastric ulcer with adherent clot which was injected and clips were placed no specimens were collected  Hospital-Problem based course  Upper GI bleed with gastric ulcer status post epinephrine injection and 3 hemostatic clip placement//adherent clot H. pylori is positive Monitor closely for further rebleed Started on empiric bismuth 524 4 times daily, on Protonix gtt. currently, continue Flagyl every 12 and doxycycline every 12 for eradication Continue LR 100 cc/H Clear liquid diet only at this time given high risk of bleed prior V-fib arrest with PCI 06/2019 Previously on aspirin and Brilinta and both of been held-was not taking Brilinta prior to admission (had started having bleeding on around 6/11  and decided to stop on his own but did not inform anyone) GI to determine when to resume aspirin Anemia of acute blood loss Hemoglobin dropped to the lowest of 7.9 was transfused 1 unit PRBC Recheck labs in a.m. Prostate cancer status post lymph node prostatectomy resection and XRT Outpatient surveillance with urology HTN Lisinopril 20 mg held on admission-resume lower dose of metoprolol at 12.5 twice daily Hyperlipidemia Holding gemfibrozil 600 twice daily for now as well as atorvastatin 80  on discharge placed on fenofibrate instead of gemfibrozil to reduce risk of rhabdo  DVT prophylaxis: SCD Code Status: Full Family Communication:  Disposition:  Status is: Inpatient Remains inpatient appropriate because:   High risk of rebleed just admitted   Consultants:  Gastroenterology Dr. Lorenso Courier  Procedures: Impression:               - Normal esophagus.                           - A large amount of food (residue) in the stomach,                            which limited visualization in the gastric fundus                           - Non-bleeding gastric ulcer with an adherent clot                            (Forrest Class IIb). Injected. Clips were placed.                           -  Normal examined duodenum.                           - No specimens collected.  Antimicrobials: Multiple as above   Subjective: Comfortable no further hematemesis no chest pain no dark stool  Objective: Vitals:   06/30/21 0645 06/30/21 0700 06/30/21 0715 06/30/21 0730  BP: 122/76 134/80 127/77 133/72  Pulse: 76 72 65 70  Resp: '15 14 16 19  '$ Temp:      TempSrc:      SpO2: 99% 100% 100% 99%  Weight:      Height:        Intake/Output Summary (Last 24 hours) at 06/30/2021 0818 Last data filed at 06/30/2021 0559 Gross per 24 hour  Intake 750 ml  Output 800 ml  Net -50 ml   Filed Weights   06/29/21 1315  Weight: 84.4 kg    Examination: Awake coherent poor dentition No icterus no  pallor Chest clear no added sounds S1-S2 no murmur no rub no gallop ROM intact Neurologically intact moving all 4 limbs equally Psych euthymic coherent   Data Reviewed: personally reviewed   CBC    Component Value Date/Time   WBC 10.8 (H) 06/30/2021 0517   RBC 3.20 (L) 06/30/2021 0517   HGB 8.8 (L) 06/30/2021 0517   HCT 26.6 (L) 06/30/2021 0517   PLT 270 06/30/2021 0517   MCV 83.1 06/30/2021 0517   MCH 27.5 06/30/2021 0517   MCHC 33.1 06/30/2021 0517   RDW 17.5 (H) 06/30/2021 0517   LYMPHSABS 0.4 (L) 06/29/2021 1624   MONOABS 0.4 06/29/2021 1624   EOSABS 0.0 06/29/2021 1624   BASOSABS 0.0 06/29/2021 1624      Latest Ref Rng & Units 06/30/2021    5:17 AM 06/29/2021   11:35 AM 06/26/2021    2:35 AM  CMP  Glucose 70 - 99 mg/dL 118  142  139   BUN 8 - 23 mg/dL '9  13  7   '$ Creatinine 0.61 - 1.24 mg/dL 0.99  1.17  1.18   Sodium 135 - 145 mmol/L 143  141  137   Potassium 3.5 - 5.1 mmol/L 3.9  4.1  3.5   Chloride 98 - 111 mmol/L 109  111  109   CO2 22 - 32 mmol/L '23  22  23   '$ Calcium 8.9 - 10.3 mg/dL 8.8  8.8  8.1   Total Protein 6.5 - 8.1 g/dL  5.1    Total Bilirubin 0.3 - 1.2 mg/dL  0.3    Alkaline Phos 38 - 126 U/L  31    AST 15 - 41 U/L  19    ALT 0 - 44 U/L  10       Radiology Studies: No results found.   Scheduled Meds:  atorvastatin  80 mg Oral Daily   bismuth subsalicylate  010 mg Oral QID   gemfibrozil  600 mg Oral BID AC   metoprolol tartrate  12.5 mg Oral BID   sodium chloride flush  3 mL Intravenous Q12H   Continuous Infusions:  doxycycline (VIBRAMYCIN) IV 100 mg (06/30/21 0817)   lactated ringers 100 mL/hr at 06/29/21 1814   metronidazole Stopped (06/29/21 1954)   pantoprazole 8 mg/hr (06/29/21 2244)     LOS: 1 day   Time spent: [place], MD [facility] To contact the attending provider between 7A-7P or the covering provider during after hours 7P-7A, please log into the web  site [email or web address] and access using universal  [place] password for that web site. If you do not have the password, please call the hospital operator.  06/30/2021, 8:18 AM

## 2021-06-30 NOTE — Progress Notes (Signed)
Mobility Specialist Progress Note   06/30/21 1205  Mobility  Activity Ambulated independently in hallway  Level of Assistance Modified independent, requires aide device or extra time  Assistive Device  (IV pole)  Distance Ambulated (ft) 1100 ft  Activity Response Tolerated well  $Mobility charge 1 Mobility   Received pt in bed having no complaints and agreeable to mobility. Pt was asymptomatic throughout ambulation and returned to room w/o fault. Left in chair w/ call bell in reach and all needs met.  Holland Falling Mobility Specialist Phone Number [phone]

## 2021-07-01 DIAGNOSIS — K922 Gastrointestinal hemorrhage, unspecified: Secondary | ICD-10-CM | POA: Diagnosis not present

## 2021-07-01 LAB — CBC
HCT: 24.3 % — ABNORMAL LOW (ref 39.0–52.0)
Hemoglobin: 8 g/dL — ABNORMAL LOW (ref 13.0–17.0)
MCH: 27.7 pg (ref 26.0–34.0)
MCHC: 32.9 g/dL (ref 30.0–36.0)
MCV: 84.1 fL (ref 80.0–100.0)
Platelets: 243 10*3/uL (ref 150–400)
RBC: 2.89 MIL/uL — ABNORMAL LOW (ref 4.22–5.81)
RDW: 17.7 % — ABNORMAL HIGH (ref 11.5–15.5)
WBC: 8.9 10*3/uL (ref 4.0–10.5)
nRBC: 0.2 % (ref 0.0–0.2)

## 2021-07-01 LAB — HIV ANTIBODY (ROUTINE TESTING W REFLEX): HIV Screen 4th Generation wRfx: NONREACTIVE

## 2021-07-01 LAB — BASIC METABOLIC PANEL
Anion gap: 5 (ref 5–15)
BUN: 11 mg/dL (ref 8–23)
CO2: 25 mmol/L (ref 22–32)
Calcium: 8 mg/dL — ABNORMAL LOW (ref 8.9–10.3)
Chloride: 109 mmol/L (ref 98–111)
Creatinine, Ser: 1.1 mg/dL (ref 0.61–1.24)
GFR, Estimated: >60 mL/min (ref >60)
Glucose, Bld: 107 mg/dL — ABNORMAL HIGH (ref 70–99)
Potassium: 3.7 mmol/L (ref 3.5–5.1)
Sodium: 139 mmol/L (ref 135–145)

## 2021-07-02 DIAGNOSIS — K922 Gastrointestinal hemorrhage, unspecified: Secondary | ICD-10-CM | POA: Diagnosis not present

## 2021-07-02 LAB — BASIC METABOLIC PANEL
Anion gap: 8 (ref 5–15)
BUN: 9 mg/dL (ref 8–23)
CO2: 24 mmol/L (ref 22–32)
Calcium: 8.3 mg/dL — ABNORMAL LOW (ref 8.9–10.3)
Chloride: 109 mmol/L (ref 98–111)
Creatinine, Ser: 0.99 mg/dL (ref 0.61–1.24)
GFR, Estimated: >60 mL/min (ref >60)
Glucose, Bld: 97 mg/dL (ref 70–99)
Potassium: 3.8 mmol/L (ref 3.5–5.1)
Sodium: 141 mmol/L (ref 135–145)

## 2021-07-02 LAB — CBC
HCT: 25.2 % — ABNORMAL LOW (ref 39.0–52.0)
Hemoglobin: 8.2 g/dL — ABNORMAL LOW (ref 13.0–17.0)
MCH: 27.5 pg (ref 26.0–34.0)
MCHC: 32.5 g/dL (ref 30.0–36.0)
MCV: 84.6 fL (ref 80.0–100.0)
Platelets: 276 10*3/uL (ref 150–400)
RBC: 2.98 MIL/uL — ABNORMAL LOW (ref 4.22–5.81)
RDW: 18.2 % — ABNORMAL HIGH (ref 11.5–15.5)
WBC: 9 10*3/uL (ref 4.0–10.5)
nRBC: 0 % (ref 0.0–0.2)

## 2021-07-02 MED ORDER — PANTOPRAZOLE SODIUM 40 MG PO TBEC: 40.0000 mg | DELAYED_RELEASE_TABLET | Freq: Two times a day (BID) | ORAL | 1 refills | Status: AC

## 2021-07-02 MED ORDER — METRONIDAZOLE 500 MG PO TABS: 500.0000 mg | ORAL_TABLET | Freq: Two times a day (BID) | ORAL | 0 refills | Status: AC

## 2021-07-02 MED ORDER — METOPROLOL TARTRATE 25 MG PO TABS: 12.5000 mg | ORAL_TABLET | Freq: Two times a day (BID) | ORAL | Status: AC

## 2021-07-02 MED ORDER — FENOFIBRATE 145 MG PO TABS: 145.0000 mg | ORAL_TABLET | Freq: Every day | ORAL | 11 refills | Status: AC

## 2021-07-02 MED ORDER — DOXYCYCLINE HYCLATE 100 MG PO TABS
100.0000 mg | ORAL_TABLET | Freq: Two times a day (BID) | ORAL | 0 refills | Status: AC
Start: 2021-07-02 — End: 2021-07-12

## 2021-07-02 MED ORDER — METRONIDAZOLE 500 MG PO TABS
500.0000 mg | ORAL_TABLET | Freq: Two times a day (BID) | ORAL | Status: DC
  Administered 2021-07-02: 500 mg via ORAL
  Filled 2021-07-02: qty 1

## 2021-07-02 MED ORDER — PANTOPRAZOLE SODIUM 40 MG PO TBEC
40.0000 mg | DELAYED_RELEASE_TABLET | Freq: Two times a day (BID) | ORAL | Status: DC
  Administered 2021-07-02: 40 mg via ORAL
  Filled 2021-07-02: qty 1

## 2021-07-02 MED ORDER — BISMUTH SUBSALICYLATE 262 MG PO CHEW: 524.0000 mg | CHEWABLE_TABLET | Freq: Four times a day (QID) | ORAL | 0 refills | Status: AC

## 2021-07-02 MED ORDER — DOXYCYCLINE HYCLATE 100 MG PO TABS
100.0000 mg | ORAL_TABLET | Freq: Two times a day (BID) | ORAL | Status: DC
  Administered 2021-07-02: 100 mg via ORAL
  Filled 2021-07-02: qty 1

## 2021-07-02 NOTE — TOC Transition Note (Signed)
Transition of Care [facility]) - CM/SW Discharge Note   Patient Details  Name: Raymond [name] MRN: [id number] Date of Birth: [birth year]  Transition of Care [facility]) CM/SW Contact:  Bess Kinds, RN Phone Number: [phone] 07/02/2021, 11:02 AM   Clinical Narrative:     Patient to transition home today. No TOC needs identified at this time.   Final next level of care: Home/Self Care Barriers to Discharge: No Barriers Identified   Patient Goals and CMS Choice        Discharge Placement                       Discharge Plan and Services                                     Social Determinants of Health (SDOH) Interventions     Readmission Risk Interventions     No data to display

## 2021-07-19 NOTE — Progress Notes (Signed)
Cardiology Clinic Note   Patient Name: Raymond [name] Date of Encounter: 07/22/2021  Primary Care Provider:  Gevena Mart, DO Primary Cardiologist:  Shelva Majestic, MD  Patient Profile    Raymond [name] 61 year old male presents the clinic today for follow-up evaluation of his chronic combined systolic and diastolic CHF.  Past Medical History    Past Medical History:  Diagnosis Date   Acute gastric ulcer with hemorrhage 06/28/2021   Acute respiratory failure (HCC)    AKI (acute kidney injury) ([place])    Arthritis    At risk for sleep apnea    STOP-BANG= 4    SENT TO PCP 07-09-2014   BPH (benign prostatic hypertrophy)    Cardiac arrest ([place]) 06/14/2019   Dental caries    periodontitis   Depression    Elevated PSA    Helicobacter pylori gastritis 06/28/2021   History of concussion    yrs ago-- no LOC-- no residual   History of depression    Hypertension    Nocturia    Prostate cancer ([place])    Wears glasses    Past Surgical History:  Procedure Laterality Date   APPENDECTOMY  age 31   BIOPSY  06/23/2021   Procedure: BIOPSY;  Surgeon: [name], MD;  Location: [facility] ENDOSCOPY;  Service: Gastroenterology;;   CORONARY STENT INTERVENTION N/A 06/14/2019   Procedure: CORONARY STENT INTERVENTION;  Surgeon: [name], MD;  Location: [place] [facility];  Service: Cardiovascular;  Laterality: N/A;   CORONARY/GRAFT ACUTE MI REVASCULARIZATION N/A 06/14/2019   Procedure: Coronary/Graft Acute MI Revascularization;  Surgeon: [name], MD;  Location: [place] [facility];  Service: Cardiovascular;  Laterality: N/A;   CYSTOSCOPY WITH URETHRAL DILATATION N/A 06/15/2019   Procedure: CYSTOSCOPY WITH BALLOON URETHRAL DILATATION;  Surgeon: [name], MD;  Location: [place];  Service: Urology;  Laterality: N/A;   ESOPHAGOGASTRODUODENOSCOPY (EGD) WITH PROPOFOL N/A 06/23/2021   Procedure: ESOPHAGOGASTRODUODENOSCOPY (EGD) WITH PROPOFOL;  Surgeon: [name], MD;  Location: [place];  Service: Gastroenterology;  Laterality: N/A;   ESOPHAGOGASTRODUODENOSCOPY (EGD) WITH PROPOFOL N/A 06/29/2021   Procedure: ESOPHAGOGASTRODUODENOSCOPY (EGD) WITH PROPOFOL;  Surgeon: Sharyn Creamer, MD;  Location: [place];  Service: Gastroenterology;  Laterality: N/A;   HEMOSTASIS CLIP PLACEMENT  06/29/2021   Procedure: HEMOSTASIS CLIP PLACEMENT;  Surgeon: Sharyn Creamer, MD;  Location: [facility] ENDOSCOPY;  Service: Gastroenterology;;   LEFT HEART CATH AND CORONARY ANGIOGRAPHY N/A 06/14/2019   Procedure: LEFT HEART CATH AND CORONARY ANGIOGRAPHY;  Surgeon: [name], MD;  Location: [place] [facility];  Service: Cardiovascular;  Laterality: N/A;   MULTIPLE EXTRACTIONS WITH ALVEOLOPLASTY N/A 10/12/2017   Procedure: MULTIPLE EXTRACTION WITH ALVEOLOPLASTY;  Surgeon: [name], DDS;  Location: [place];  Service: Oral Surgery;  Laterality: N/A;   PELVIC LYMPH NODE DISSECTION Bilateral 11/04/2018   Procedure: PELVIC LYMPH NODE DISSECTION;  Surgeon: [name], MD;  Location: [facility];  Service: Urology;  Laterality: Bilateral;   PROSTATE BIOPSY N/A 07/15/2014   Procedure: SATURATION BIOPSY TRANSRECTAL ULTRASONIC PROSTATE (TUBP);  Surgeon: [name], MD;  Location: [facility];  Service: Urology;  Laterality: N/A;   PROSTATE BIOPSY N/A 08/07/2018   Procedure: BIOPSY TRANSRECTAL ULTRASONIC PROSTATE (TUBP);  Surgeon: [name], MD;  Location: [facility];  Service: Urology;  Laterality: N/A;  ONLY NEEDS 30 MIN   RIGHT KNEE ARTHROSCOPY /  MENISECTOMY/  DEBRIDEMENT OF CHONDROMALACIA PATELLA AND MEDIAL SHELF PLICA/  SUBCHONDROPLASTY  06-10-2010   ROBOT ASSISTED LAPAROSCOPIC RADICAL PROSTATECTOMY N/A 11/04/2018   Procedure: XI ROBOTIC ASSISTED LAPAROSCOPIC RADICAL PROSTATECTOMY;  Surgeon: [name], MD;  Location: [facility];  Service: Urology;  Laterality: N/A;   SCHLEROTHERAPY  06/29/2021   Procedure: SCHLEROTHERAPy;  Surgeon: Sharyn Creamer,  MD;  Location: [facility] ENDOSCOPY;  Service: Gastroenterology;;   TOTAL KNEE ARTHROPLASTY  05/31/2011   Procedure: TOTAL KNEE ARTHROPLASTY;  Surgeon: [name], MD;  Location: [place];  Service: Orthopedics;  Laterality: Right;  with revision stem    Allergies  No Known Allergies  History of Present Illness    [name] is a PMH of essential hypertension, cardiac arrest, combined systolic and diastolic CHF, gastric ulcer with hemorrhage, GI bleeding, prostate cancer, tobacco abuse, cocaine abuse, depression, dyslipidemia, and seizure.  NSTEMI 06/14/2019 with out of hospital cardiac arrest requiring CPR.  He went emergently to the Cath Lab which showed 85% proximal circumflex stenosis.  He underwent PCI DES.  He was placed on aspirin and Brilinta.  He contacted virtually by Bunnie Domino, DNP on 10/10/2019.  During that time he reported he was feeling great.  He was walking regularly and spending time with his family.  He reported that his daughter had gotten married a few weeks ago.  He was in good spirits.  He reported medical compliance.  He was admitted to the hospital on 06/30/2021 and discharged on 07/02/2021.  He presented with abdominal pain, nausea, vomiting, and diarrhea.  He was noted to have bloody emesis with dark clots and his blood pressure was 50 systolic.  He received a 350 LR bolus.  His hemoglobin was found to be 7.9 with a creatinine of 1.1.  He received 1 unit of PRBCs.  He underwent urgent endoscopy 621 which showed normal esophagus and a large amount of food residue and nonbleeding gastric ulcer with adherent clot which was injected and clips were placed on the area.  His diet was advanced per GI.  He was instructed he should remain on soft diet for 2 to 3 days postdischarge.  His aspirin was resumed on 6/22 postoperatively.  Due to his low blood pressure his lisinopril was held on admission and his metoprolol 12.5 mg was continued twice daily.  His gemfibrozil was discontinued.   His Tricor and atorvastatin were resumed.  He presents to clinic today for follow-up evaluation states he feels well today.  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  He reports that he is eating a heart healthy diet full of fruits and vegetables.  He has lost around 10 pounds.  We reviewed his previous angiography and cholesterol panel.  He expressed understanding.  We reviewed his recent hospitalization.  He denies any further episodes of bleeding.  I will give him a salty 6 diet sheet, have him maintain his physical activity, request labs from PCP, and plan follow-up in 12 months.  Today he denies chest pain, shortness of breath, lower extremity edema, fatigue, palpitations, melena, hematuria, hemoptysis, diaphoresis, weakness, presyncope, syncope, orthopnea, and PND.  Home Medications    Prior to Admission medications   Medication Sig Start Date End Date Taking? Authorizing Provider  acetaminophen (TYLENOL) 500 MG tablet Take 500 mg by mouth daily as needed for headache or mild pain.    [provider]  aspirin 81 MG chewable tablet Chew 1 tablet (81 mg total) by mouth daily. 06/28/21   Thurnell Lose,  MD  atorvastatin (LIPITOR) 80 MG tablet Take 1 tablet (80 mg total) by mouth daily. 06/21/19   Mercy Riding, MD  fenofibrate (TRICOR) 145 MG tablet Take 1 tablet (145 mg total) by mouth daily. 07/02/21 07/02/22  Nita Sells, MD  metoprolol tartrate (LOPRESSOR) 25 MG tablet Take 0.5 tablets (12.5 mg total) by mouth 2 (two) times daily. 07/02/21   Nita Sells, MD  pantoprazole (PROTONIX) 40 MG tablet Take 1 tablet (40 mg total) by mouth 2 (two) times daily. 07/02/21 10/30/21  Nita Sells, MD    Family History    Family History  Problem Relation Age of Onset   Prostate cancer Neg Hx    Colon cancer Neg Hx    Breast cancer Neg Hx    He indicated that his mother is deceased. He indicated that his father is  deceased. He indicated that the status of his neg hx is unknown.  Social History    Social History   Socioeconomic History   Marital status: Single    Spouse name: Not on file   Number of children: 4   Years of education: Not on file   Highest education level: Not on file  Occupational History    Comment: disability  Tobacco Use   Smoking status: Former    Packs/day: 0.50    Years: 4.00    Total pack years: 2.00    Types: Cigarettes    Quit date: 08/28/2018    Years since quitting: 2.9   Smokeless tobacco: Never  Vaping Use   Vaping Use: Never used  Substance and Sexual Activity   Alcohol use: Yes    Comment: SOCIAL 1 beer a day   Drug use: Never    Comment: hx marijuana use- none recently states on 10/30/2018   Sexual activity: Yes  Other Topics Concern   Not on file  Social History Narrative   ** Merged History Encounter **       His daughter is a Marine scientist at [employer], who works night shift.   Social Determinants of Health   Financial Resource Strain: Not on file  Food Insecurity: Not on file  Transportation Needs: Not on file  Physical Activity: Not on file  Stress: Not on file  Social Connections: Not on file  Intimate Partner Violence: Not on file     Review of Systems    General:  No chills, fever, night sweats or weight changes.  Cardiovascular:  No chest pain, dyspnea on exertion, edema, orthopnea, palpitations, paroxysmal nocturnal dyspnea. Dermatological: No rash, lesions/masses Respiratory: No cough, dyspnea Urologic: No hematuria, dysuria Abdominal:   No nausea, vomiting, diarrhea, bright red blood per rectum, melena, or hematemesis Neurologic:  No visual changes, wkns, changes in mental status. All other systems reviewed and are otherwise negative except as noted above.  Physical Exam    VS:  BP 118/72   Pulse 73   Ht '5\' 10"'$  (1.778 m)   Wt 176 lb 3.2 oz (79.9 kg)   SpO2 98%   BMI 25.28 kg/m  , BMI Body mass index is 25.28  kg/m. GEN: Well nourished, well developed, in no acute distress. HEENT: normal. Neck: Supple, no JVD, carotid bruits, or masses. Cardiac: RRR, no murmurs, rubs, or gallops. No clubbing, cyanosis, edema.  Radials/DP/PT 2+ and equal bilaterally.  Respiratory:  Respirations regular and unlabored, clear to auscultation bilaterally. GI: Soft, nontender, nondistended, BS + x 4. MS: no deformity or atrophy. Skin: warm and dry, no rash. Neuro:  Strength and sensation are intact. Psych: Normal affect.  Accessory Clinical Findings    Recent Labs: 06/23/2021: Magnesium 2.0 06/29/2021: ALT 10 07/02/2021: BUN 9; Creatinine, Ser 0.99; Hemoglobin 8.2; Platelets 276; Potassium 3.8; Sodium 141   Recent Lipid Panel    Component Value Date/Time   CHOL 129 06/16/2019 1122   TRIG 370 (H) 06/17/2019 0629   HDL 27 (L) 06/16/2019 1122   CHOLHDL 4.8 06/16/2019 1122   VLDL 74 (H) 06/16/2019 1122   LDLCALC 28 06/16/2019 1122    ECG personally reviewed by me today-none today.  Echocardiogram 06/23/2021  IMPRESSIONS     1. Left ventricular ejection fraction, by estimation, is 45 to 50%. The  left ventricle has mildly decreased function. The left ventricle has no  regional wall motion abnormalities. There is mild concentric left  ventricular hypertrophy. Left ventricular  diastolic parameters are consistent with Grade I diastolic dysfunction  (impaired relaxation).   2. Right ventricular systolic function is normal. The right ventricular  size is normal.   3. The mitral valve is normal in structure. Mild mitral valve  regurgitation. No evidence of mitral stenosis.   4. The aortic valve is normal in structure. Aortic valve regurgitation is  not visualized. No aortic stenosis is present.   5. There is borderline dilatation of the aortic root, measuring 38 mm.  There is borderline dilatation of the ascending aorta, measuring 37 mm.   6. The inferior vena cava is normal in size with greater than 50%   respiratory variability, suggesting right atrial pressure of 3 mmHg.   Comparison(s): A prior study was performed on 06/15/2019. The left  ventricular function is worsened.   Cardiac catheterization 06/14/2019 Post intervention, there is a 0% residual stenosis. Prox Cx to Mid Cx lesion is 85% stenosed. A stent was successfully placed.   Out of hospital cardiac arrest most likely due to transient occlusion of the proximal circumflex with reperfusion and evidence for  85% proximal stenosis with TIMI-3 flow.   Normal LAD and right coronary artery.   Preserved global LV contractility with LVEDP 10 mmHg.   Successful PCI to the proximal circumflex vessel with PTCA and ultimate insertion of a 3.5 x 15 mm Resolute DES stent with the stenosis being reduced to 0%.   RECOMMENDATION: DAPT for minimum of a year.  Hypothermia protocol will be instituted.  We will continue IV Cangrelor.  Initiate Brilinta via NG tube once the patient is in the CCU with discontinuance of Cangrelor 30 to 60 minutes post Brilinta administration.  We will continue bivalirudin for 2 hours post procedure.  2D echo Doppler study in a.m.   Diagnostic Dominance: Right  Intervention   Assessment & Plan   1.  Coronary artery disease-no chest pain today.  No recent episodes of arm neck back or chest discomfort.  Underwent emergent cardiac catheterization in the setting of NSTEMI and cardiac arrest 06/14/2019.  He received PCI with DES to his circumflex. Continue aspirin, atorvastatin, metoprolol Heart healthy low-sodium diet-salty 6 given Increase physical activity as tolerated Request labs from PCP  Hyperlipidemia-LDL 21 09/23/2019 Continue aspirin, atorvastatin, fenofibrate Heart healthy low-sodium high-fiber diet Increase physical activity as tolerated Request labs from PCP  Essential hypertension-BP today 118/72.  Well-controlled at home. Continue current medical therapy Heart healthy low-sodium diet Increase  physical activity as tolerated  Disposition: Follow-up with Dr. Claiborne Billings or me in 9-12 months.   Jossie Ng. Tadao Emig NP-C     07/22/2021, 8:19 AM [place]  [place] [phone] Fax [phone]  Notice: This dictation was prepared with Dragon dictation along with smaller phrase technology. Any transcriptional errors that result from this process are unintentional and may not be corrected upon review.  I spent 13 minutes examining this patient, reviewing medications, and using patient centered shared decision making involving her cardiac care.  Prior to her visit I spent greater than 20 minutes reviewing her past medical history,  medications, and prior cardiac tests.

## 2021-07-22 ENCOUNTER — Ambulatory Visit: Payer: Medicaid Other | Admitting: General Practice

## 2021-07-22 ENCOUNTER — Encounter: Payer: Self-pay | Admitting: General Practice

## 2021-07-22 VITALS — BP 118/72 | HR 73 | Ht 70.0 in | Wt 176.2 lb

## 2021-07-22 DIAGNOSIS — I10 Essential (primary) hypertension: Secondary | ICD-10-CM

## 2021-07-22 DIAGNOSIS — E7800 Pure hypercholesterolemia, unspecified: Secondary | ICD-10-CM | POA: Diagnosis not present

## 2021-07-22 DIAGNOSIS — I2510 Atherosclerotic heart disease of native coronary artery without angina pectoris: Secondary | ICD-10-CM | POA: Diagnosis not present

## 2021-07-22 NOTE — Patient Instructions (Signed)
Medication Instructions:  The current medical regimen is effective;  continue present plan and medications as directed. Please refer to the Current Medication list given to you today.   *If you need a refill on your cardiac medications before your next appointment, please call your pharmacy*  Lab Work:   Testing/Procedures:  NONE    NONE  If you have labs (blood work) drawn today and your tests are completely normal, you will receive your results only by: [place] (if you have MyChart) OR  A paper copy in the mail If you have any lab test that is abnormal or we need to change your treatment, we will call you to review the results.  Special Instructions PLEASE READ AND FOLLOW SALTY 6-ATTACHED-1,'800mg'$  daily  PLEASE MAINTAIN PHYSICAL ACTIVITY AS TOLERATED   Follow-Up: Your next appointment:  12 month(s) In Person with Shelva Majestic, MD  If primary card or EP is not listed click here to update    :1     Please call our office 2 months in advance to schedule this appointment  :1  At [facility], you and your health needs are our priority.  As part of our continuing mission to provide you with exceptional heart care, we have created designated Provider Care Teams.  These Care Teams include your primary Cardiologist (physician) and Advanced Practice Providers (APPs -  Physician Assistants and Nurse Practitioners) who all work together to provide you with the care you need, when you need it.  We recommend signing up for the patient portal called "MyChart".  Sign up information is provided on this After Visit Summary.  MyChart is used to connect with patients for Virtual Visits (Telemedicine).  Patients are able to view lab/test results, encounter notes, upcoming appointments, etc.  Non-urgent messages can be sent to your provider as well.   To learn more about what you can do with MyChart, go to [email or web address].    Important Information About Sugar             6 SALTY THINGS  TO AVOID     1,'800MG'$  DAILY

## 2021-07-27 ENCOUNTER — Other Ambulatory Visit (INDEPENDENT_AMBULATORY_CARE_PROVIDER_SITE_OTHER): Payer: Medicaid Other

## 2021-07-27 ENCOUNTER — Other Ambulatory Visit: Payer: Self-pay

## 2021-07-27 ENCOUNTER — Ambulatory Visit: Payer: Medicaid Other | Admitting: Physician Assistant

## 2021-07-27 ENCOUNTER — Encounter: Payer: Self-pay | Admitting: Physician Assistant

## 2021-07-27 VITALS — BP 130/80 | HR 64 | Ht 70.0 in | Wt 178.0 lb

## 2021-07-27 DIAGNOSIS — Z8719 Personal history of other diseases of the digestive system: Secondary | ICD-10-CM | POA: Diagnosis not present

## 2021-07-27 DIAGNOSIS — Z8711 Personal history of peptic ulcer disease: Secondary | ICD-10-CM

## 2021-07-27 DIAGNOSIS — Z85028 Personal history of other malignant neoplasm of stomach: Secondary | ICD-10-CM

## 2021-07-27 DIAGNOSIS — Z8619 Personal history of other infectious and parasitic diseases: Secondary | ICD-10-CM | POA: Diagnosis not present

## 2021-07-27 DIAGNOSIS — D500 Iron deficiency anemia secondary to blood loss (chronic): Secondary | ICD-10-CM | POA: Diagnosis not present

## 2021-07-27 LAB — COMPREHENSIVE METABOLIC PANEL
ALT: 9 U/L (ref 0–53)
AST: 14 U/L (ref 0–37)
Albumin: 4.2 g/dL (ref 3.5–5.2)
Alkaline Phosphatase: 29 U/L — ABNORMAL LOW (ref 39–117)
BUN: 13 mg/dL (ref 6–23)
CO2: 27 mEq/L (ref 19–32)
Calcium: 9.1 mg/dL (ref 8.4–10.5)
Chloride: 108 mEq/L (ref 96–112)
Creatinine, Ser: 1.08 mg/dL (ref 0.40–1.50)
GFR: 74.19 mL/min (ref >60.00)
Glucose, Bld: 104 mg/dL — ABNORMAL HIGH (ref 70–99)
Potassium: 4.1 mEq/L (ref 3.5–5.1)
Sodium: 141 mEq/L (ref 135–145)
Total Bilirubin: 0.5 mg/dL (ref 0.2–1.2)
Total Protein: 6.8 g/dL (ref 6.0–8.3)

## 2021-07-27 LAB — CBC WITH DIFFERENTIAL/PLATELET
Basophils Absolute: 0.1 10*3/uL (ref 0.0–0.1)
Basophils Relative: 1.3 % (ref 0.0–3.0)
Eosinophils Absolute: 0.1 10*3/uL (ref 0.0–0.7)
Eosinophils Relative: 2.1 % (ref 0.0–5.0)
HCT: 30.5 % — ABNORMAL LOW (ref 39.0–52.0)
Hemoglobin: 9.6 g/dL — ABNORMAL LOW (ref 13.0–17.0)
Lymphocytes Relative: 17.4 % (ref 12.0–46.0)
Lymphs Abs: 0.8 10*3/uL (ref 0.7–4.0)
MCHC: 31.6 g/dL (ref 30.0–36.0)
MCV: 75.8 fl — ABNORMAL LOW (ref 78.0–100.0)
Monocytes Absolute: 0.7 10*3/uL (ref 0.1–1.0)
Monocytes Relative: 14.8 % — ABNORMAL HIGH (ref 3.0–12.0)
Neutro Abs: 3 10*3/uL (ref 1.4–7.7)
Neutrophils Relative %: 64.4 % (ref 43.0–77.0)
Platelets: 184 10*3/uL (ref 150.0–400.0)
RBC: 4.02 Mil/uL — ABNORMAL LOW (ref 4.22–5.81)
RDW: 23.8 % — ABNORMAL HIGH (ref 11.5–15.5)
WBC: 4.7 10*3/uL (ref 4.0–10.5)

## 2021-07-27 LAB — IBC + FERRITIN
Ferritin: 7.1 ng/mL — ABNORMAL LOW (ref 22.0–322.0)
Iron: 25 ug/dL — ABNORMAL LOW (ref 42–165)
Saturation Ratios: 4.6 % — ABNORMAL LOW (ref 20.0–50.0)
TIBC: 543.2 ug/dL — ABNORMAL HIGH (ref 250.0–450.0)
Transferrin: 388 mg/dL — ABNORMAL HIGH (ref 212.0–360.0)

## 2021-07-27 NOTE — Progress Notes (Addendum)
Chief Complaint: Follow-up after hospitalization for GI bleed  HPI:    Mr. Janes is a 61 year old African-American male with a past medical history as listed below including recent gastric ulcer hemorrhage, who presents to clinic today for follow-up after recent hospitalization for GI bleed.    06/29/2021-07/02/2021 patient admitted for acute GI bleed secondary to gastric ulcer from H. pylori.  06/29/2021 EGD with normal esophagus, large amount of food residue and nonbleeding gastric ulcer with adherent clot which was injected and clipped.  During that admission his Brilinta was discontinued.  Repeat EGD recommended in 3 months.    07/02/2021 CBC with hemoglobin of 8.2.    Today, the patient tells me that he is feeling well.  He has no complaints or concerns.  He finished all of his antibiotics and medications for H. pylori and continues on Pantoprazole 40 mg twice daily.  Does tell me he has followed with his PCP as well as his cardiologist.  He thinks that they did some lab work.  This was about a month ago now.    Denies fever, chills, weight loss or blood in his stool.  Past Medical History:  Diagnosis Date   Acute gastric ulcer with hemorrhage 06/28/2021   Acute respiratory failure (HCC)    AKI (acute kidney injury) ([place])    Arthritis    At risk for sleep apnea    STOP-BANG= 4    SENT TO PCP 07-09-2014   BPH (benign prostatic hypertrophy)    Cardiac arrest ([place]) 06/14/2019   Dental caries    periodontitis   Depression    Elevated PSA    Helicobacter pylori gastritis 06/28/2021   History of concussion    yrs ago-- no LOC-- no residual   History of depression    Hypertension    Nocturia    Prostate cancer ([place])    Wears glasses     Past Surgical History:  Procedure Laterality Date   APPENDECTOMY  age 72   BIOPSY  06/23/2021   Procedure: BIOPSY;  Surgeon: [name], MD;  Location: [facility] ENDOSCOPY;  Service: Gastroenterology;;   CORONARY STENT INTERVENTION N/A 06/14/2019    Procedure: CORONARY STENT INTERVENTION;  Surgeon: [name], MD;  Location: [place] CV LAB;  Service: Cardiovascular;  Laterality: N/A;   CORONARY/GRAFT ACUTE MI REVASCULARIZATION N/A 06/14/2019   Procedure: Coronary/Graft Acute MI Revascularization;  Surgeon: [name], MD;  Location: [place] [facility];  Service: Cardiovascular;  Laterality: N/A;   CYSTOSCOPY WITH URETHRAL DILATATION N/A 06/15/2019   Procedure: CYSTOSCOPY WITH BALLOON URETHRAL DILATATION;  Surgeon: [name], MD;  Location: [place];  Service: Urology;  Laterality: N/A;   ESOPHAGOGASTRODUODENOSCOPY (EGD) WITH PROPOFOL N/A 06/23/2021   Procedure: ESOPHAGOGASTRODUODENOSCOPY (EGD) WITH PROPOFOL;  Surgeon: [name], MD;  Location: [place];  Service: Gastroenterology;  Laterality: N/A;   ESOPHAGOGASTRODUODENOSCOPY (EGD) WITH PROPOFOL N/A 06/29/2021   Procedure: ESOPHAGOGASTRODUODENOSCOPY (EGD) WITH PROPOFOL;  Surgeon: Sharyn Creamer, MD;  Location: [place];  Service: Gastroenterology;  Laterality: N/A;   HEMOSTASIS CLIP PLACEMENT  06/29/2021   Procedure: HEMOSTASIS CLIP PLACEMENT;  Surgeon: Sharyn Creamer, MD;  Location: [facility] ENDOSCOPY;  Service: Gastroenterology;;   LEFT HEART CATH AND CORONARY ANGIOGRAPHY N/A 06/14/2019   Procedure: LEFT HEART CATH AND CORONARY ANGIOGRAPHY;  Surgeon: [name], MD;  Location: [place] CV LAB;  Service: Cardiovascular;  Laterality: N/A;   MULTIPLE EXTRACTIONS WITH ALVEOLOPLASTY N/A 10/12/2017   Procedure: MULTIPLE EXTRACTION WITH ALVEOLOPLASTY;  Surgeon: [name], DDS;  Location: [place];  Service: Oral Surgery;  Laterality: N/A;   PELVIC LYMPH NODE DISSECTION Bilateral 11/04/2018   Procedure: PELVIC LYMPH NODE DISSECTION;  Surgeon: [name], MD;  Location: [facility];  Service: Urology;  Laterality: Bilateral;   PROSTATE BIOPSY N/A 07/15/2014   Procedure: SATURATION BIOPSY TRANSRECTAL ULTRASONIC PROSTATE (TUBP);  Surgeon: [name], MD;  Location[facility];  Service: Urology;  Laterality: N/A;   PROSTATE BIOPSY N/A 08/07/2018   Procedure: BIOPSY TRANSRECTAL ULTRASONIC PROSTATE (TUBP);  Surgeon: [name], MD;  Location: [facility];  Service: Urology;  Laterality: N/A;  ONLY NEEDS 30 MIN   RIGHT KNEE ARTHROSCOPY /  MENISECTOMY/  DEBRIDEMENT OF CHONDROMALACIA PATELLA AND MEDIAL SHELF PLICA/  SUBCHONDROPLASTY  06-10-2010   ROBOT ASSISTED LAPAROSCOPIC RADICAL PROSTATECTOMY N/A 11/04/2018   Procedure: XI ROBOTIC ASSISTED LAPAROSCOPIC RADICAL PROSTATECTOMY;  Surgeon: [name], MD;  Location: [facility];  Service: Urology;  Laterality: N/A;   SCHLEROTHERAPY  06/29/2021   Procedure: SCHLEROTHERAPy;  Surgeon: Sharyn Creamer, MD;  Location: [facility] ENDOSCOPY;  Service: Gastroenterology;;   TOTAL KNEE ARTHROPLASTY  05/31/2011   Procedure: TOTAL KNEE ARTHROPLASTY;  Surgeon: [name], MD;  Location: [place];  Service: Orthopedics;  Laterality: Right;  with revision stem    Current Outpatient Medications  Medication Sig Dispense Refill   acetaminophen (TYLENOL) 500 MG tablet Take 500 mg by mouth daily as needed for headache or mild pain.     aspirin 81 MG chewable tablet Chew 1 tablet (81 mg total) by mouth daily. 90 tablet 1   atorvastatin (LIPITOR) 80 MG tablet Take 1 tablet (80 mg total) by mouth daily. 90 tablet 1   fenofibrate (TRICOR) 145 MG tablet Take 1 tablet (145 mg total) by mouth daily. 30 tablet 11   lisinopril (ZESTRIL) 20 MG tablet Take 20 mg by mouth daily.     metoprolol tartrate (LOPRESSOR) 25 MG tablet Take 0.5 tablets (12.5 mg total) by mouth 2 (two) times daily.     pantoprazole (PROTONIX) 40 MG tablet Take 1 tablet (40 mg total) by mouth 2 (two) times daily. 120 tablet 1   No current facility-administered medications for this visit.   Facility-Administered Medications Ordered in Other Visits  Medication Dose Route Frequency Provider Last Rate Last Admin   sodium phosphate (FLEET)  7-19 GM/118ML enema 1 enema  1 enema Rectal Once Winter, Christopher Aaron, MD        Allergies as of 07/27/2021   (No Known Allergies)    Family History  Problem Relation Age of Onset   Prostate cancer Neg Hx    Colon cancer Neg Hx    Breast cancer Neg Hx     Social History   Socioeconomic History   Marital status: Single    Spouse name: Not on file   Number of children: 4   Years of education: Not on file   Highest education level: Not on file  Occupational History    Comment: disability  Tobacco Use   Smoking status: Former    Packs/day: 0.50    Years: 4.00    Total pack years: 2.00    Types: Cigarettes    Quit date: 08/28/2018    Years since quitting: 2.9   Smokeless tobacco: Never  Vaping Use   Vaping Use: Never used  Substance and Sexual Activity   Alcohol use: Not Currently    Comment: SOCIAL 1 beer a  day   Drug use: Never    Comment: hx marijuana use- none recently states on 10/30/2018   Sexual activity: Yes  Other Topics Concern   Not on file  Social History Narrative   ** Merged History Encounter **       His daughter is a Marine scientist at [employer], who works night shift.   Social Determinants of Health   Financial Resource Strain: Not on file  Food Insecurity: Not on file  Transportation Needs: Not on file  Physical Activity: Not on file  Stress: Not on file  Social Connections: Not on file  Intimate Partner Violence: Not on file    Review of Systems:    Constitutional: No weight loss, fever or chills Cardiovascular: No chest pain Respiratory: No SOB Gastrointestinal: See HPI and otherwise negative   Physical Exam:  Vital signs: BP 130/80   Pulse 64   Ht '5\' 10"'$  (1.778 m)   Wt 178 lb (80.7 kg)   BMI 25.54 kg/m   Constitutional:   Pleasant AA male appears to be in NAD, Well developed, Well nourished, alert and cooperative Respiratory: Respirations even and unlabored. Lungs clear to auscultation bilaterally.   No wheezes, crackles,  or rhonchi.  Cardiovascular: Normal S1, S2. No MRG. Regular rate and rhythm. No peripheral edema, cyanosis or pallor.  Gastrointestinal:  Soft, nondistended, nontender. No rebound or guarding. Normal bowel sounds. No appreciable masses or hepatomegaly. Rectal:  Not performed.  Psychiatric: Demonstrates good judgement and reason without abnormal affect or behaviors.  RELEVANT LABS AND IMAGING: CBC    Component Value Date/Time   WBC 9.0 07/02/2021 0244   RBC 2.98 (L) 07/02/2021 0244   HGB 8.2 (L) 07/02/2021 0244   HCT 25.2 (L) 07/02/2021 0244   PLT 276 07/02/2021 0244   MCV 84.6 07/02/2021 0244   MCH 27.5 07/02/2021 0244   MCHC 32.5 07/02/2021 0244   RDW 18.2 (H) 07/02/2021 0244   LYMPHSABS 0.4 (L) 06/29/2021 1624   MONOABS 0.4 06/29/2021 1624   EOSABS 0.0 06/29/2021 1624   BASOSABS 0.0 06/29/2021 1624    CMP     Component Value Date/Time   NA 141 07/02/2021 0244   K 3.8 07/02/2021 0244   CL 109 07/02/2021 0244   CO2 24 07/02/2021 0244   GLUCOSE 97 07/02/2021 0244   BUN 9 07/02/2021 0244   CREATININE 0.99 07/02/2021 0244   CALCIUM 8.3 (L) 07/02/2021 0244   PROT 5.1 (L) 06/29/2021 1135   ALBUMIN 2.8 (L) 06/29/2021 1135   AST 19 06/29/2021 1135   ALT 10 06/29/2021 1135   ALKPHOS 31 (L) 06/29/2021 1135   BILITOT 0.3 06/29/2021 1135   GFRNONAA >60 07/02/2021 0244   GFRAA >60 06/20/2019 0451    Assessment: 1.  History of bleeding gastric ulcer from H. pylori: 2 hospitalizations back to back for bleeding ulcer which has been treated now at time of last EGD 6/21, no further signs of bleeding 2.  History of MI: Previously on Brilinta which was stopped last hospitalization, patient is followed with cardiology since then and they have maintained him on aspirin  Plan: 1.  Recheck CBC, CMP and iron studies 2.  Scheduled patient for repeat EGD around 9/21 with Dr. Carlean Purl in the [facility].  Did provide the patient a detailed list of risks for the procedure and he agrees to proceed.  Patient is appropriate for endoscopic procedure(s) in the ambulatory ([facility]) setting.  3.  Patient to continue Pantoprazole 40 mg twice daily for  now.  This can be adjusted after time of EGD.  We will have patient hold his Pantoprazole for 1 week prior to EGD so that he can have retesting for H. pylori at that time. 4.  Patient to follow in clinic per recommendations from Dr. Carlean Purl after time of procedure.  Ellouise Newer, PA-C [facility] 07/27/2021, 8:31 AM  Cc: Bonsu, Osei A, DO

## 2021-07-27 NOTE — Patient Instructions (Signed)
If you are age 61 or older, your body mass index should be between 23-30. Your Body mass index is 25.54 kg/m. If this is out of the aforementioned range listed, please consider follow up with your Primary Care Provider.  If you are age 62 or younger, your body mass index should be between 19-25. Your Body mass index is 25.54 kg/m. If this is out of the aformentioned range listed, please consider follow up with your Primary Care Provider.   ________________________________________________________  The [facility] providers would like to encourage you to use [facility] to communicate with providers for non-urgent requests or questions.  Due to long hold times on the telephone, sending your provider a message by [facility] may be a faster and more efficient way to get a response.  Please allow 48 business hours for a response.  Please remember that this is for non-urgent requests.  _______________________________________________________   Your provider has requested that you go to the basement level for lab work before leaving today. Press "B" on the elevator. The lab is located at the first door on the left as you exit the elevator.   You have been scheduled for an endoscopy. Please follow written instructions given to you at your visit today. If you use inhalers (even only as needed), please bring them with you on the day of your procedure.   Follow up per recommendations after procedure   Due to recent changes in healthcare laws, you may see the results of your imaging and laboratory studies on MyChart before your provider has had a chance to review them.  We understand that in some cases there may be results that are confusing or concerning to you. Not all laboratory results come back in the same time frame and the provider may be waiting for multiple results in order to interpret others.  Please give [place] 48 hours in order for your provider to thoroughly review all the results before contacting the office  for clarification of your results.    Thank you for choosing [facility]  Lovett Calender

## 2021-07-28 ENCOUNTER — Telehealth: Payer: Self-pay | Admitting: Physician Assistant

## 2021-07-28 ENCOUNTER — Telehealth: Payer: Self-pay | Admitting: *Deleted

## 2021-07-28 NOTE — Telephone Encounter (Signed)
===  View-only below this line=== ----- Message ----- From: Levin Erp, PA Sent: 07/27/2021  10:07 AM EDT To: Oda Kilts, CMA Subject: hold Pantoprazole                              Can you asked patient to hold his Pantoprazole for 1 week prior to his scheduled EGD.  Dr. Carlean Purl had requested that in a separate note and I had forgotten.  That way he can be retested for H. pylori.  Thanks, JLL

## 2021-07-28 NOTE — Telephone Encounter (Signed)
Scheduled appt per 7/19 referral. Pt is aware of appt date and time. Pt is aware to arrive 15 mins prior to appt time and to bring and updated insurance card. Pt is aware of appt location.   

## 2021-07-28 NOTE — Telephone Encounter (Signed)
Spoke with patient he will hold pantoprazole 1 week prior to procedure as instructed

## 2021-08-24 ENCOUNTER — Inpatient Hospital Stay: Payer: Medicaid Other | Attending: Physician Assistant | Admitting: Physician Assistant

## 2021-08-24 ENCOUNTER — Inpatient Hospital Stay: Payer: Medicaid Other

## 2021-08-24 ENCOUNTER — Telehealth: Payer: Self-pay

## 2021-08-24 ENCOUNTER — Other Ambulatory Visit: Payer: Self-pay

## 2021-08-24 ENCOUNTER — Encounter: Payer: Self-pay | Admitting: Physician Assistant

## 2021-08-24 ENCOUNTER — Telehealth: Payer: Self-pay | Admitting: Physician Assistant

## 2021-08-24 VITALS — BP 151/87 | HR 64 | Temp 98.1°F | Resp 20 | Wt 181.4 lb

## 2021-08-24 DIAGNOSIS — K922 Gastrointestinal hemorrhage, unspecified: Secondary | ICD-10-CM | POA: Insufficient documentation

## 2021-08-24 DIAGNOSIS — D649 Anemia, unspecified: Secondary | ICD-10-CM | POA: Insufficient documentation

## 2021-08-24 DIAGNOSIS — Z8546 Personal history of malignant neoplasm of prostate: Secondary | ICD-10-CM | POA: Diagnosis not present

## 2021-08-24 DIAGNOSIS — D508 Other iron deficiency anemias: Secondary | ICD-10-CM

## 2021-08-24 DIAGNOSIS — D500 Iron deficiency anemia secondary to blood loss (chronic): Secondary | ICD-10-CM | POA: Diagnosis present

## 2021-08-24 DIAGNOSIS — Z87891 Personal history of nicotine dependence: Secondary | ICD-10-CM | POA: Diagnosis not present

## 2021-08-24 LAB — CBC WITH DIFFERENTIAL (CANCER CENTER ONLY)
Abs Immature Granulocytes: 0.01 10*3/uL (ref 0.00–0.07)
Basophils Absolute: 0 10*3/uL (ref 0.0–0.1)
Basophils Relative: 1 %
Eosinophils Absolute: 0.1 10*3/uL (ref 0.0–0.5)
Eosinophils Relative: 2 %
HCT: 32 % — ABNORMAL LOW (ref 39.0–52.0)
Hemoglobin: 9.8 g/dL — ABNORMAL LOW (ref 13.0–17.0)
Immature Granulocytes: 0 %
Lymphocytes Relative: 16 %
Lymphs Abs: 0.8 10*3/uL (ref 0.7–4.0)
MCH: 22.5 pg — ABNORMAL LOW (ref 26.0–34.0)
MCHC: 30.6 g/dL (ref 30.0–36.0)
MCV: 73.6 fL — ABNORMAL LOW (ref 80.0–100.0)
Monocytes Absolute: 0.7 10*3/uL (ref 0.1–1.0)
Monocytes Relative: 15 %
Neutro Abs: 3.2 10*3/uL (ref 1.7–7.7)
Neutrophils Relative %: 66 %
Platelet Count: 185 10*3/uL (ref 150–400)
RBC: 4.35 MIL/uL (ref 4.22–5.81)
RDW: 21.8 % — ABNORMAL HIGH (ref 11.5–15.5)
WBC Count: 4.8 10*3/uL (ref 4.0–10.5)
nRBC: 0 % (ref 0.0–0.2)

## 2021-08-24 LAB — CMP (CANCER CENTER ONLY)
ALT: 11 U/L (ref 0–44)
AST: 13 U/L — ABNORMAL LOW (ref 15–41)
Albumin: 4.3 g/dL (ref 3.5–5.0)
Alkaline Phosphatase: 31 U/L — ABNORMAL LOW (ref 38–126)
Anion gap: 2 — ABNORMAL LOW (ref 5–15)
BUN: 12 mg/dL (ref 8–23)
CO2: 26 mmol/L (ref 22–32)
Calcium: 9.3 mg/dL (ref 8.9–10.3)
Chloride: 111 mmol/L (ref 98–111)
Creatinine: 0.96 mg/dL (ref 0.61–1.24)
GFR, Estimated: >60 mL/min (ref >60)
Glucose, Bld: 103 mg/dL — ABNORMAL HIGH (ref 70–99)
Potassium: 3.9 mmol/L (ref 3.5–5.1)
Sodium: 139 mmol/L (ref 135–145)
Total Bilirubin: 0.5 mg/dL (ref 0.3–1.2)
Total Protein: 7.1 g/dL (ref 6.5–8.1)

## 2021-08-24 LAB — IRON AND IRON BINDING CAPACITY (CC-WL,HP ONLY)
Iron: 21 ug/dL — ABNORMAL LOW (ref 45–182)
Saturation Ratios: 4 % — ABNORMAL LOW (ref 17.9–39.5)
TIBC: 479 ug/dL — ABNORMAL HIGH (ref 250–450)
UIBC: 458 ug/dL — ABNORMAL HIGH (ref 117–376)

## 2021-08-24 LAB — RETIC PANEL
Immature Retic Fract: 43.5 % — ABNORMAL HIGH (ref 2.3–15.9)
RBC.: 4.29 MIL/uL (ref 4.22–5.81)
Retic Count, Absolute: 63.5 10*3/uL (ref 19.0–186.0)
Retic Ct Pct: 1.5 % (ref 0.4–3.1)
Reticulocyte Hemoglobin: 24.9 pg — ABNORMAL LOW (ref >27.9)

## 2021-08-24 LAB — FERRITIN: Ferritin: 6 ng/mL — ABNORMAL LOW (ref 24–336)

## 2021-08-24 MED ORDER — FERROUS SULFATE 325 (65 FE) MG PO TBEC: 325.0000 mg | DELAYED_RELEASE_TABLET | Freq: Every day | ORAL | 3 refills | Status: DC

## 2021-08-24 NOTE — Progress Notes (Signed)
[place] [phone]   Fax:([phone] [place] NOTE  Patient Care Team: Bonsu, Talmage Coin, DO as PCP - General (Internal Medicine) [name], MD as PCP - Cardiology (Cardiology) Bonsu, Talmage Coin, DO (Internal Medicine)  Hematological/Oncological History 1)  06/22/2021-06/26/2021: Admitted for hematemesis and found to have nonbleeding gastric ulcer with adherent clot on EGD, Received 2 units of PRBC.   2)  06/29/2021-07/02/2021 patient admitted for acute GI bleed secondary to gastric ulcer from H. pylori.  06/29/2021 EGD with normal esophagus, large amount of food residue and nonbleeding gastric ulcer with adherent clot which was injected and clipped.  He received 1 units of PRBC  3) 07/27/2021: Labs show Hgb 9.6, MCV 75.8, Iron 25, saturation 4.6%, ferritin 7.1, TIBC 543.2.   4) 08/24/2021: Establish care with [facility] Hematology  CHIEF COMPLAINTS/PURPOSE OF CONSULTATION:  Iron deficiency anemia 2/2 GI bleed  HISTORY OF PRESENTING ILLNESS:  Raymond [name] 61 y.o. male with medical history significant for prostate cancer, hypertension, depression and arthritis.  He presents to the hematology clinic for evaluation for iron deficiency anemia.  Unaccompanied for this visit  On exam today, Robar reports that his energy levels are at baseline and he is able to complete all his daily activities on his own.  He has a good appetite and denies any weight loss.  He denies any nausea, vomiting or abdominal pain.  His bowel habits are unchanged without any recurrent episodes of diarrhea or constipation.  He denies easy bruising or signs of active bleeding since hospital discharge.  He denies fevers, chills, night sweats, shortness of breath, chest pain or cough.  He has no other complaints.  Rest of the 10 point ROS is below.  MEDICAL HISTORY:  Past Medical History:  Diagnosis Date   Acute gastric ulcer with hemorrhage 06/28/2021   Acute respiratory failure (HCC)     AKI (acute kidney injury) ([place])    Arthritis    At risk for sleep apnea    STOP-BANG= 4    SENT TO PCP 07-09-2014   BPH (benign prostatic hypertrophy)    Cardiac arrest ([place]) 06/14/2019   Dental caries    periodontitis   Depression    Elevated PSA    Helicobacter pylori gastritis 06/28/2021   History of concussion    yrs ago-- no LOC-- no residual   History of depression    Hypertension    Nocturia    Prostate cancer ([place])    Wears glasses     SURGICAL HISTORY: Past Surgical History:  Procedure Laterality Date   APPENDECTOMY  age 34   BIOPSY  06/23/2021   Procedure: BIOPSY;  Surgeon: [name], MD;  Location: [facility] ENDOSCOPY;  Service: Gastroenterology;;   CORONARY STENT INTERVENTION N/A 06/14/2019   Procedure: CORONARY STENT INTERVENTION;  Surgeon: [name], MD;  Location: [place] CV LAB;  Service: Cardiovascular;  Laterality: N/A;   CORONARY/GRAFT ACUTE MI REVASCULARIZATION N/A 06/14/2019   Procedure: Coronary/Graft Acute MI Revascularization;  Surgeon: [name], MD;  Location: [place] CV LAB;  Service: Cardiovascular;  Laterality: N/A;   CYSTOSCOPY WITH URETHRAL DILATATION N/A 06/15/2019   Procedure: CYSTOSCOPY WITH BALLOON URETHRAL DILATATION;  Surgeon: [name], MD;  Location: [place];  Service: Urology;  Laterality: N/A;   ESOPHAGOGASTRODUODENOSCOPY (EGD) WITH PROPOFOL N/A 06/23/2021   Procedure: ESOPHAGOGASTRODUODENOSCOPY (EGD) WITH PROPOFOL;  Surgeon: [name], MD;  Location: [place];  Service: Gastroenterology;  Laterality: N/A;   ESOPHAGOGASTRODUODENOSCOPY (  EGD) WITH PROPOFOL N/A 06/29/2021   Procedure: ESOPHAGOGASTRODUODENOSCOPY (EGD) WITH PROPOFOL;  Surgeon: Sharyn Creamer, MD;  Location: [place];  Service: Gastroenterology;  Laterality: N/A;   HEMOSTASIS CLIP PLACEMENT  06/29/2021   Procedure: HEMOSTASIS CLIP PLACEMENT;  Surgeon: Sharyn Creamer, MD;  Location: [facility] ENDOSCOPY;  Service: Gastroenterology;;   LEFT HEART CATH AND  CORONARY ANGIOGRAPHY N/A 06/14/2019   Procedure: LEFT HEART CATH AND CORONARY ANGIOGRAPHY;  Surgeon: [name], MD;  Location: [place] CV LAB;  Service: Cardiovascular;  Laterality: N/A;   MULTIPLE EXTRACTIONS WITH ALVEOLOPLASTY N/A 10/12/2017   Procedure: MULTIPLE EXTRACTION WITH ALVEOLOPLASTY;  Surgeon: [name], DDS;  Location: [place];  Service: Oral Surgery;  Laterality: N/A;   PELVIC LYMPH NODE DISSECTION Bilateral 11/04/2018   Procedure: PELVIC LYMPH NODE DISSECTION;  Surgeon: [name], MD;  [facility];  Service: Urology;  Laterality: Bilateral;   PROSTATE BIOPSY N/A 07/15/2014   Procedure: SATURATION BIOPSY TRANSRECTAL ULTRASONIC PROSTATE (TUBP);  Surgeon: [name], MD;  Location: [facility];  Service: Urology;  Laterality: N/A;   PROSTATE BIOPSY N/A 08/07/2018   Procedure: BIOPSY TRANSRECTAL ULTRASONIC PROSTATE (TUBP);  Surgeon: [name], MD;  Location[facility];  Service: Urology;  Laterality: N/A;  ONLY NEEDS 30 MIN   RIGHT KNEE ARTHROSCOPY /  MENISECTOMY/  DEBRIDEMENT OF CHONDROMALACIA PATELLA AND MEDIAL SHELF PLICA/  SUBCHONDROPLASTY  06-10-2010   ROBOT ASSISTED LAPAROSCOPIC RADICAL PROSTATECTOMY N/A 11/04/2018   Procedure: XI ROBOTIC ASSISTED LAPAROSCOPIC RADICAL PROSTATECTOMY;  Surgeon: [name], MD;  [facility];  Service: Urology;  Laterality: N/A;   SCHLEROTHERAPY  06/29/2021   Procedure: SCHLEROTHERAPy;  Surgeon: Sharyn Creamer, MD;  Location: [facility] ENDOSCOPY;  Service: Gastroenterology;;   TOTAL KNEE ARTHROPLASTY  05/31/2011   Procedure: TOTAL KNEE ARTHROPLASTY;  Surgeon: [name], MD;  Location: [place];  Service: Orthopedics;  Laterality: Right;  with revision stem    SOCIAL HISTORY: Social History   Socioeconomic History   Marital status: Single    Spouse name: Not on file   Number of children: 4   Years of education: Not on file   Highest education level: Not on file   Occupational History    Comment: disability  Tobacco Use   Smoking status: Former    Packs/day: 0.50    Years: 4.00    Total pack years: 2.00    Types: Cigarettes    Quit date: 08/28/2018    Years since quitting: 2.9   Smokeless tobacco: Never  Vaping Use   Vaping Use: Never used  Substance and Sexual Activity   Alcohol use: Not Currently   Drug use: Never    Comment: hx marijuana use- none recently states on 10/30/2018   Sexual activity: Yes  Other Topics Concern   Not on file  Social History Narrative   ** Merged History Encounter **       His daughter is a Marine scientist at [employer], who works night shift.   Social Determinants of Health   Financial Resource Strain: Not on file  Food Insecurity: Not on file  Transportation Needs: Not on file  Physical Activity: Not on file  Stress: Not on file  Social Connections: Not on file  Intimate Partner Violence: Not on file    FAMILY HISTORY: Family History  Problem Relation Age of Onset   Leukemia Brother    Prostate cancer Neg Hx    Colon cancer Neg Hx  Breast cancer Neg Hx     ALLERGIES:  has No Known Allergies.  MEDICATIONS:  Current Outpatient Medications  Medication Sig Dispense Refill   acetaminophen (TYLENOL) 500 MG tablet Take 500 mg by mouth daily as needed for headache or mild pain.     aspirin 81 MG chewable tablet Chew 1 tablet (81 mg total) by mouth daily. 90 tablet 1   atorvastatin (LIPITOR) 80 MG tablet Take 1 tablet (80 mg total) by mouth daily. 90 tablet 1   fenofibrate (TRICOR) 145 MG tablet Take 1 tablet (145 mg total) by mouth daily. 30 tablet 11   ferrous sulfate 325 (65 FE) MG EC tablet Take 1 tablet (325 mg total) by mouth daily with breakfast. 30 tablet 3   ibuprofen (ADVIL) 800 MG tablet Take 800 mg by mouth every 6 (six) hours as needed.     lisinopril (ZESTRIL) 20 MG tablet Take 20 mg by mouth daily.     metoprolol tartrate (LOPRESSOR) 25 MG tablet Take 0.5 tablets (12.5 mg total)  by mouth 2 (two) times daily.     pantoprazole (PROTONIX) 40 MG tablet Take 1 tablet (40 mg total) by mouth 2 (two) times daily. 120 tablet 1   BRILINTA 90 MG TABS tablet Take 90 mg by mouth 2 (two) times daily. (Patient not taking: Reported on 08/24/2021)     No current facility-administered medications for this visit.   Facility-Administered Medications Ordered in Other Visits  Medication Dose Route Frequency Provider Last Rate Last Admin   sodium phosphate (FLEET) 7-19 GM/118ML enema 1 enema  1 enema Rectal Once Winter, Conception Oms, MD        REVIEW OF SYSTEMS:   Constitutional: ( - ) fevers, ( - )  chills , ( - ) night sweats Eyes: ( - ) blurriness of vision, ( - ) double vision, ( - ) watery eyes Ears, nose, mouth, throat, and face: ( - ) mucositis, ( - ) sore throat Respiratory: ( - ) cough, ( - ) dyspnea, ( - ) wheezes Cardiovascular: ( - ) palpitation, ( - ) chest discomfort, ( - ) lower extremity swelling Gastrointestinal:  ( - ) nausea, ( - ) heartburn, ( - ) change in bowel habits Skin: ( - ) abnormal skin rashes Lymphatics: ( - ) new lymphadenopathy, ( - ) easy bruising Neurological: ( - ) numbness, ( - ) tingling, ( - ) new weaknesses Behavioral/Psych: ( - ) mood change, ( - ) new changes  All other systems were reviewed with the patient and are negative.  PHYSICAL EXAMINATION: ECOG PERFORMANCE STATUS: 0 - Asymptomatic  Vitals:   08/24/21 0918  BP: (!) 151/87  Pulse: 64  Resp: 20  Temp: 98.1 F (36.7 C)  SpO2: 100%   Filed Weights   08/24/21 0918  Weight: 181 lb 6.4 oz (82.3 kg)    GENERAL: well appearing male in NAD  SKIN: skin color, texture, turgor are normal, no rashes or significant lesions EYES: conjunctiva are pink and non-injected, sclera clear OROPHARYNX: no exudate, no erythema; lips, buccal mucosa, and tongue normal  NECK: supple, non-tender LYMPH:  no palpable lymphadenopathy in the cervical or supraclavicular lymph nodes.  LUNGS: clear to  auscultation and percussion with normal breathing effort HEART: regular rate & rhythm and no murmurs and no lower extremity edema ABDOMEN: soft, non-tender, non-distended, normal bowel sounds Musculoskeletal: no cyanosis of digits and no clubbing  PSYCH: alert & oriented x 3, fluent speech NEURO: no focal motor/sensory deficits  LABORATORY DATA:  I have reviewed the data as listed    Latest Ref Rng & Units 07/27/2021    9:04 AM 07/02/2021    2:44 AM 07/01/2021    1:28 AM  CBC  WBC 4.0 - 10.5 K/uL 4.7  9.0  8.9   Hemoglobin 13.0 - 17.0 g/dL 9.6  8.2  8.0   Hematocrit 39.0 - 52.0 % 30.5  25.2  24.3   Platelets 150.0 - 400.0 K/uL 184.0  276  243        Latest Ref Rng & Units 07/27/2021    9:04 AM 07/02/2021    2:44 AM 07/01/2021    1:28 AM  CMP  Glucose 70 - 99 mg/dL 104  97  107   BUN 6 - 23 mg/dL '13  9  11   '$ Creatinine 0.40 - 1.50 mg/dL 1.08  0.99  1.10   Sodium 135 - 145 mEq/L 141  141  139   Potassium 3.5 - 5.1 mEq/L 4.1  3.8  3.7   Chloride 96 - 112 mEq/L 108  109  109   CO2 19 - 32 mEq/L '27  24  25   '$ Calcium 8.4 - 10.5 mg/dL 9.1  8.3  8.0   Total Protein 6.0 - 8.3 g/dL 6.8     Total Bilirubin 0.2 - 1.2 mg/dL 0.5     Alkaline Phos 39 - 117 U/L 29     AST 0 - 37 U/L 14     ALT 0 - 53 U/L 9      ASSESSMENT & PLAN ERASTUS BARTOLOMEI is a 61 y.o. male who presents to the hematology clinic for initial evaluation for iron deficiency anemia.  Patient was recently hospitalized for acute GI bleed secondary to gastric ulcer from H. pylori.  This was felt to be causing his iron deficiency anemia.  Patient is not taking any iron supplementation at this time and has not in the past.  We recommend patient's start with ferrous sulfate 325 mg once a day with a source of vitamin C.  We will proceed with repeat labs today to check CBC, CMP, reticulocyte panel, ferritin, iron and TIBC.    #Iron deficiency anemia: --etiology felt to be GI bleed 2/2 gastric ulcer from H.pylori.  --completed treatment  for H.pylori infection and continues on protonix 40 mg twice daily.  --scheduled for repeat EGD on 09/22/2021. --sent prescription for ferrous sulfate 325 mg once a day with a source of vitamin C --provided list of iron rich foods to incorporate into diet --labs today to check CBC, CMP, retic panel, ferritin, iron and TIBC --if anemia doesn't improve in 4 weeks, we will arrange for IV iron infusions.  --RTC in 4 weeks for lab only appt then labs/office visit in 3 months.   Orders Placed This Encounter  Procedures   CBC with Differential ([place] Only)    Standing Status:   Future    Number of Occurrences:   1    Standing Expiration Date:   08/25/2022   CMP ([place] only)    Standing Status:   Future    Number of Occurrences:   1    Standing Expiration Date:   08/25/2022   Ferritin    Standing Status:   Future    Number of Occurrences:   1    Standing Expiration Date:   08/25/2022   Iron and Iron Binding Capacity ([facility] only)    Standing Status:   Future  Number of Occurrences:   1    Standing Expiration Date:   08/25/2022   Retic Panel    Standing Status:   Future    Number of Occurrences:   1    Standing Expiration Date:   08/25/2022    All questions were answered. The patient knows to call the clinic with any problems, questions or concerns.  I have spent a total of 60 minutes minutes of face-to-face and non-face-to-face time, preparing to see the patient, obtaining and/or reviewing separately obtained history, performing a medically appropriate examination, counseling and educating the patient, ordering medications/tests/procedures, documenting clinical information in the electronic health record, and care coordination.   Dede Query, PA-C Department of Hematology/Oncology [place] [facility] Phone: [phone]

## 2021-08-24 NOTE — Telephone Encounter (Signed)
-----   Message from Lincoln Brigham, PA-C sent at 08/24/2021 10:59 AM EDT ----- Please notify patient that anemia is stable. Recommend to start iron pills. We will recheck labs in 4 weeks. If iron levels don't improve at that time, we will arrange for IV iron.

## 2021-08-24 NOTE — Telephone Encounter (Signed)
Per 8/16 los called and spoke to pt about appointment  pt confirmed appointments

## 2021-08-24 NOTE — Telephone Encounter (Signed)
Pt advised and voiced understanding.   

## 2021-09-21 ENCOUNTER — Other Ambulatory Visit: Payer: Self-pay

## 2021-09-21 ENCOUNTER — Inpatient Hospital Stay: Payer: Medicaid Other | Attending: Physician Assistant

## 2021-09-21 DIAGNOSIS — D508 Other iron deficiency anemias: Secondary | ICD-10-CM

## 2021-09-21 DIAGNOSIS — D509 Iron deficiency anemia, unspecified: Secondary | ICD-10-CM | POA: Diagnosis present

## 2021-09-21 LAB — CBC WITH DIFFERENTIAL (CANCER CENTER ONLY)
Abs Immature Granulocytes: 0.02 10*3/uL (ref 0.00–0.07)
Basophils Absolute: 0 10*3/uL (ref 0.0–0.1)
Basophils Relative: 1 %
Eosinophils Absolute: 0.1 10*3/uL (ref 0.0–0.5)
Eosinophils Relative: 4 %
HCT: 39.1 % (ref 39.0–52.0)
Hemoglobin: 11.3 g/dL — ABNORMAL LOW (ref 13.0–17.0)
Immature Granulocytes: 1 %
Lymphocytes Relative: 19 %
Lymphs Abs: 0.7 10*3/uL (ref 0.7–4.0)
MCH: 22 pg — ABNORMAL LOW (ref 26.0–34.0)
MCHC: 28.9 g/dL — ABNORMAL LOW (ref 30.0–36.0)
MCV: 76.1 fL — ABNORMAL LOW (ref 80.0–100.0)
Monocytes Absolute: 0.6 10*3/uL (ref 0.1–1.0)
Monocytes Relative: 16 %
Neutro Abs: 2.2 10*3/uL (ref 1.7–7.7)
Neutrophils Relative %: 59 %
Platelet Count: 212 10*3/uL (ref 150–400)
RBC: 5.14 MIL/uL (ref 4.22–5.81)
RDW: 22.5 % — ABNORMAL HIGH (ref 11.5–15.5)
WBC Count: 3.7 10*3/uL — ABNORMAL LOW (ref 4.0–10.5)
nRBC: 0 % (ref 0.0–0.2)

## 2021-09-21 LAB — RETIC PANEL
Immature Retic Fract: 40.8 % — ABNORMAL HIGH (ref 2.3–15.9)
RBC.: 5.13 MIL/uL (ref 4.22–5.81)
Retic Count, Absolute: 70.8 10*3/uL (ref 19.0–186.0)
Retic Ct Pct: 1.4 % (ref 0.4–3.1)
Reticulocyte Hemoglobin: 28.2 pg (ref >27.9)

## 2021-09-21 LAB — CMP (CANCER CENTER ONLY)
ALT: 21 U/L (ref 0–44)
AST: 19 U/L (ref 15–41)
Albumin: 4.3 g/dL (ref 3.5–5.0)
Alkaline Phosphatase: 35 U/L — ABNORMAL LOW (ref 38–126)
Anion gap: 4 — ABNORMAL LOW (ref 5–15)
BUN: 9 mg/dL (ref 8–23)
CO2: 28 mmol/L (ref 22–32)
Calcium: 9.7 mg/dL (ref 8.9–10.3)
Chloride: 109 mmol/L (ref 98–111)
Creatinine: 0.86 mg/dL (ref 0.61–1.24)
GFR, Estimated: >60 mL/min (ref >60)
Glucose, Bld: 113 mg/dL — ABNORMAL HIGH (ref 70–99)
Potassium: 4 mmol/L (ref 3.5–5.1)
Sodium: 141 mmol/L (ref 135–145)
Total Bilirubin: 0.6 mg/dL (ref 0.3–1.2)
Total Protein: 6.7 g/dL (ref 6.5–8.1)

## 2021-09-21 LAB — IRON AND IRON BINDING CAPACITY (CC-WL,HP ONLY)
Iron: 59 ug/dL (ref 45–182)
Saturation Ratios: 13 % — ABNORMAL LOW (ref 17.9–39.5)
TIBC: 456 ug/dL — ABNORMAL HIGH (ref 250–450)
UIBC: 397 ug/dL — ABNORMAL HIGH (ref 117–376)

## 2021-09-21 LAB — FERRITIN: Ferritin: 12 ng/mL — ABNORMAL LOW (ref 24–336)

## 2021-09-21 NOTE — Progress Notes (Signed)
[place] [facility] History and Physical   Primary Care Physician:  Bonsu, Osei A, DO   Reason for Procedure:   Follow-up gastric ulcer  Plan:    EGD     HPI: Raymond [name] is a 61 y.o. male seen by Ellouise Newer PA-C as below (July 2023) doing well for f/u ulcer tody   Raymond [name] is a 61 year old African-American male with a past medical history as listed below including recent gastric ulcer hemorrhage, who presents to clinic today for follow-up after recent hospitalization for GI bleed.    06/29/2021-07/02/2021 patient admitted for acute GI bleed secondary to gastric ulcer from H. pylori.  06/29/2021 EGD with normal esophagus, large amount of food residue and nonbleeding gastric ulcer with adherent clot which was injected and clipped.  During that admission his Brilinta was discontinued.  Repeat EGD recommended in 3 months.    07/02/2021 CBC with hemoglobin of 8.2.    Today, the patient tells me that he is feeling well.  He has no complaints or concerns.  He finished all of his antibiotics and medications for H. pylori and continues on Pantoprazole 40 mg twice daily.  Does tell me he has followed with his PCP as well as his cardiologist.  He thinks that they did some lab work.  This was about a month ago now.    Denies fever, chills, weight loss or blood in his stool.   Past Medical History:  Diagnosis Date   Acute gastric ulcer with hemorrhage 06/28/2021   Acute respiratory failure (HCC)    AKI (acute kidney injury) ([place])    Arthritis    At risk for sleep apnea    STOP-BANG= 4    SENT TO PCP 07-09-2014   BPH (benign prostatic hypertrophy)    Cardiac arrest ([place]) 06/14/2019   Dental caries    periodontitis   Depression    Elevated PSA    Helicobacter pylori gastritis 06/28/2021   History of concussion    yrs ago-- no LOC-- no residual   History of depression    Hypertension    Nocturia    Prostate cancer ([place])    Wears glasses     Past Surgical History:  Procedure  Laterality Date   APPENDECTOMY  age 43   BIOPSY  06/23/2021   Procedure: BIOPSY;  Surgeon: [name], MD;  Location: [facility] ENDOSCOPY;  Service: Gastroenterology;;   CORONARY STENT INTERVENTION N/A 06/14/2019   Procedure: CORONARY STENT INTERVENTION;  Surgeon: [name], MD;  Location: [place] CV LAB;  Service: Cardiovascular;  Laterality: N/A;   CORONARY/GRAFT ACUTE MI REVASCULARIZATION N/A 06/14/2019   Procedure: Coronary/Graft Acute MI Revascularization;  Surgeon: [name], MD;  Location: [place] CV LAB;  Service: Cardiovascular;  Laterality: N/A;   CYSTOSCOPY WITH URETHRAL DILATATION N/A 06/15/2019   Procedure: CYSTOSCOPY WITH BALLOON URETHRAL DILATATION;  Surgeon: [name], MD;  Location: [place];  Service: Urology;  Laterality: N/A;   ESOPHAGOGASTRODUODENOSCOPY (EGD) WITH PROPOFOL N/A 06/23/2021   Procedure: ESOPHAGOGASTRODUODENOSCOPY (EGD) WITH PROPOFOL;  Surgeon: [name], MD;  Location: [place];  Service: Gastroenterology;  Laterality: N/A;   ESOPHAGOGASTRODUODENOSCOPY (EGD) WITH PROPOFOL N/A 06/29/2021   Procedure: ESOPHAGOGASTRODUODENOSCOPY (EGD) WITH PROPOFOL;  Surgeon: Sharyn Creamer, MD;  Location: [place];  Service: Gastroenterology;  Laterality: N/A;   HEMOSTASIS CLIP PLACEMENT  06/29/2021   Procedure: HEMOSTASIS CLIP PLACEMENT;  Surgeon: Sharyn Creamer, MD;  Location: [facility] ENDOSCOPY;  Service: Gastroenterology;;   LEFT HEART CATH  AND CORONARY ANGIOGRAPHY N/A 06/14/2019   Procedure: LEFT HEART CATH AND CORONARY ANGIOGRAPHY;  Surgeon: [name], MD;  Location: [place] CV LAB;  Service: Cardiovascular;  Laterality: N/A;   MULTIPLE EXTRACTIONS WITH ALVEOLOPLASTY N/A 10/12/2017   Procedure: MULTIPLE EXTRACTION WITH ALVEOLOPLASTY;  Surgeon: [name], DDS;  Location: [place];  Service: Oral Surgery;  Laterality: N/A;   PELVIC LYMPH NODE DISSECTION Bilateral 11/04/2018   Procedure: PELVIC LYMPH NODE DISSECTION;  Surgeon: [name], MD;  [facility];  Service: Urology;  Laterality: Bilateral;   PROSTATE BIOPSY N/A 07/15/2014   Procedure: SATURATION BIOPSY TRANSRECTAL ULTRASONIC PROSTATE (TUBP);  Surgeon: [name], MD;  Location: [facility];  Service: Urology;  Laterality: N/A;   PROSTATE BIOPSY N/A 08/07/2018   Procedure: BIOPSY TRANSRECTAL ULTRASONIC PROSTATE (TUBP);  Surgeon: [name], MD;  Location: [facility];  Service: Urology;  Laterality: N/A;  ONLY NEEDS 30 MIN   RIGHT KNEE ARTHROSCOPY /  MENISECTOMY/  DEBRIDEMENT OF CHONDROMALACIA PATELLA AND MEDIAL SHELF PLICA/  SUBCHONDROPLASTY  06/10/2010   ROBOT ASSISTED LAPAROSCOPIC RADICAL PROSTATECTOMY N/A 11/04/2018   Procedure: XI ROBOTIC ASSISTED LAPAROSCOPIC RADICAL PROSTATECTOMY;  Surgeon: [name], MD;  [facility];  Service: Urology;  Laterality: N/A;   SCHLEROTHERAPY  06/29/2021   Procedure: SCHLEROTHERAPy;  Surgeon: Sharyn Creamer, MD;  Location: [facility] ENDOSCOPY;  Service: Gastroenterology;;   TOTAL KNEE ARTHROPLASTY  05/31/2011   Procedure: TOTAL KNEE ARTHROPLASTY;  Surgeon: [name], MD;  Location: [place];  Service: Orthopedics;  Laterality: Right;  with revision stem   UPPER GASTROINTESTINAL ENDOSCOPY      Prior to Admission medications   Medication Sig Start Date End Date Taking? Authorizing Provider  acetaminophen (TYLENOL) 500 MG tablet Take 500 mg by mouth daily as needed for headache or mild pain.    [provider]  aspirin 81 MG chewable tablet Chew 1 tablet (81 mg total) by mouth daily. 06/28/21   Thurnell Lose, MD  atorvastatin (LIPITOR) 80 MG tablet Take 1 tablet (80 mg total) by mouth daily. 06/21/19   Mercy Riding, MD  fenofibrate (TRICOR) 145 MG tablet Take 1 tablet (145 mg total) by mouth daily. 07/02/21 07/02/22  Nita Sells, MD  ferrous sulfate 325 (65 FE) MG EC tablet Take 1 tablet (325 mg total) by mouth daily with breakfast. 08/24/21    Lincoln Brigham, PA-C  ibuprofen (ADVIL) 800 MG tablet Take 800 mg by mouth every 6 (six) hours as needed. 08/09/21   [provider]  lisinopril (ZESTRIL) 20 MG tablet Take 20 mg by mouth daily. 07/17/21   [provider]  metoprolol tartrate (LOPRESSOR) 25 MG tablet Take 0.5 tablets (12.5 mg total) by mouth 2 (two) times daily. 07/02/21   Nita Sells, MD  pantoprazole (PROTONIX) 40 MG tablet Take 1 tablet (40 mg total) by mouth 2 (two) times daily. 07/02/21 10/30/21  Nita Sells, MD    Current Outpatient Medications  Medication Sig Dispense Refill   aspirin 81 MG chewable tablet Chew 1 tablet (81 mg total) by mouth daily. 90 tablet 1   atorvastatin (LIPITOR) 80 MG tablet Take 1 tablet (80 mg total) by mouth daily. 90 tablet 1   fenofibrate (TRICOR) 145 MG tablet Take 1 tablet (145 mg total) by mouth daily. 30 tablet 11   ferrous sulfate 325 (65 FE) MG EC tablet Take 1 tablet (325 mg total) by mouth daily with breakfast. 30 tablet  3   gemfibrozil (LOPID) 600 MG tablet Take 600 mg by mouth 2 (two) times daily.     lisinopril (ZESTRIL) 20 MG tablet Take 20 mg by mouth daily.     metoprolol tartrate (LOPRESSOR) 25 MG tablet Take 0.5 tablets (12.5 mg total) by mouth 2 (two) times daily.     pantoprazole (PROTONIX) 40 MG tablet Take 1 tablet (40 mg total) by mouth 2 (two) times daily. 120 tablet 1   acetaminophen (TYLENOL) 500 MG tablet Take 500 mg by mouth daily as needed for headache or mild pain.     ibuprofen (ADVIL) 800 MG tablet Take 800 mg by mouth every 6 (six) hours as needed.     Current Facility-Administered Medications  Medication Dose Route Frequency Provider Last Rate Last Admin   0.9 %  sodium chloride infusion  500 mL Intravenous Once [name], MD       Facility-Administered Medications Ordered in Other Visits  Medication Dose Route Frequency Provider Last Rate Last Admin   sodium phosphate (FLEET) 7-19 GM/118ML enema 1 enema  1 enema Rectal  Once Winter, Christopher Aaron, MD        Allergies as of 09/22/2021   (No Known Allergies)    Family History  Problem Relation Age of Onset   Leukemia Brother    Prostate cancer Neg Hx    Colon cancer Neg Hx    Breast cancer Neg Hx    Esophageal cancer Neg Hx    Rectal cancer Neg Hx    Stomach cancer Neg Hx     Social History   Socioeconomic History   Marital status: Single    Spouse name: Not on file   Number of children: 4   Years of education: Not on file   Highest education level: Not on file  Occupational History    Comment: disability  Tobacco Use   Smoking status: Some Days    Packs/day: 0.50    Years: 4.00    Total pack years: 2.00    Types: Cigarettes   Smokeless tobacco: Never  Vaping Use   Vaping Use: Never used  Substance and Sexual Activity   Alcohol use: Not Currently   Drug use: Never    Comment: hx marijuana use- none recently states on 10/30/2018   Sexual activity: Yes  Other Topics Concern   Not on file  Social History Narrative   ** Merged History Encounter **       His daughter is a Marine scientist at [employer], who works night shift.   Social Determinants of Health   Financial Resource Strain: Not on file  Food Insecurity: Not on file  Transportation Needs: Not on file  Physical Activity: Not on file  Stress: Not on file  Social Connections: Not on file  Intimate Partner Violence: Not on file    Review of Systems:  All other review of systems negative except as mentioned in the HPI.  Physical Exam: Vital signs BP (!) 156/85   Pulse 61   Temp (!) 97.5 F (36.4 C) (Temporal)   Resp 19   Ht '5\' 10"'$  (1.778 m)   Wt 178 lb (80.7 kg)   SpO2 100%   BMI 25.54 kg/m   General:   Alert,  Well-developed, well-nourished, pleasant and cooperative in NAD Lungs:  Clear throughout to auscultation.   Heart:  Regular rate and rhythm; no murmurs, clicks, rubs,  or gallops. Abdomen:  Soft, nontender and nondistended. Normal bowel sounds.  Neuro/Psych:  Alert and cooperative. Normal mood and affect. A and O x 3   '@Rhiana Morash'$  Simonne Maffucci, MD, [facility] [phone] (pager) 09/22/2021 8:47 AM@

## 2021-09-22 ENCOUNTER — Other Ambulatory Visit (HOSPITAL_COMMUNITY): Payer: Self-pay | Admitting: Physician Assistant

## 2021-09-22 ENCOUNTER — Encounter: Payer: Self-pay | Admitting: Internal Medicine

## 2021-09-22 ENCOUNTER — Telehealth: Payer: Self-pay

## 2021-09-22 ENCOUNTER — Ambulatory Visit (AMBULATORY_SURGERY_CENTER): Payer: Medicaid Other | Admitting: Internal Medicine

## 2021-09-22 VITALS — BP 142/87 | HR 57 | Temp 97.5°F | Resp 14 | Ht 70.0 in | Wt 178.0 lb

## 2021-09-22 DIAGNOSIS — K2960 Other gastritis without bleeding: Secondary | ICD-10-CM

## 2021-09-22 DIAGNOSIS — K31A0 Gastric intestinal metaplasia, unspecified: Secondary | ICD-10-CM | POA: Diagnosis not present

## 2021-09-22 DIAGNOSIS — K259 Gastric ulcer, unspecified as acute or chronic, without hemorrhage or perforation: Secondary | ICD-10-CM | POA: Diagnosis not present

## 2021-09-22 DIAGNOSIS — D500 Iron deficiency anemia secondary to blood loss (chronic): Secondary | ICD-10-CM

## 2021-09-22 DIAGNOSIS — K257 Chronic gastric ulcer without hemorrhage or perforation: Secondary | ICD-10-CM | POA: Diagnosis not present

## 2021-09-22 DIAGNOSIS — K2951 Unspecified chronic gastritis with bleeding: Secondary | ICD-10-CM

## 2021-09-22 DIAGNOSIS — Z8719 Personal history of other diseases of the digestive system: Secondary | ICD-10-CM

## 2021-09-22 DIAGNOSIS — Z8619 Personal history of other infectious and parasitic diseases: Secondary | ICD-10-CM

## 2021-09-22 DIAGNOSIS — Z8711 Personal history of peptic ulcer disease: Secondary | ICD-10-CM

## 2021-09-22 MED ORDER — SODIUM CHLORIDE 0.9 % IV SOLN: 500.0000 mL | Freq: Once | INTRAVENOUS | Status: DC

## 2021-09-22 NOTE — Progress Notes (Signed)
Called to room to assist during endoscopic procedure.  Patient ID and intended procedure confirmed with present staff. Received instructions for my participation in the procedure from the performing physician.  

## 2021-09-22 NOTE — Progress Notes (Signed)
Report to PACU, RN, vss, BBS= Clear.  

## 2021-09-22 NOTE — Telephone Encounter (Signed)
T/C to pt to advise of lab results of improved anemia and the need for IV iron. Pt verbalized understanding and will wait for insurance approval and someone to call for appt date and time.

## 2021-09-22 NOTE — Telephone Encounter (Signed)
-----   Message from Lincoln Brigham, PA-C sent at 09/22/2021  3:16 PM EDT ----- Please notify patient that anemia has improved with improving Hgb. However, iron levels are still quite low. Recommend IV iron to help bolster levels.

## 2021-09-22 NOTE — Op Note (Signed)
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 The patient's tolerance of                            previous anesthesia was also reviewed. The risks                            and benefits of the procedure and the sedation                            options and risks were discussed with the patient.                            All questions were answered, and informed consent                            was obtained. Prior Anticoagulants: The patient has                            taken no previous anticoagulant or antiplatelet                            agents. ASA Grade Assessment: II - A patient with                            mild systemic disease. After reviewing the risks                            and benefits, the patient was deemed in                            satisfactory condition to undergo the procedure.                           After obtaining informed consent, the endoscope was  passed under direct vision. Throughout the                            procedure, the patient's blood pressure, pulse, and                            oxygen saturations were monitored continuously. The                            Endoscope was introduced through the mouth, and                            advanced to the second part of duodenum. The                            patient  tolerated the procedure well. The upper GI                            endoscopy was accomplished without difficulty. Scope In: Scope Out: Findings:                 One non-bleeding cratered gastric ulcer with no                            stigmata of bleeding was found at the incisura. The                            lesion was 4 mm in largest dimension. Biopsies were                            taken with a cold forceps for histology.                            Verification of patient identification for the                            specimen was done. Estimated blood loss was minimal.                           The exam was otherwise without abnormality.                           The cardia and gastric fundus were normal on                            retroflexion. Complications:            No immediate complications. Estimated Blood Loss:     Estimated blood loss was minimal. Impression:               - Non-bleeding gastric ulcer with no stigmata of                            bleeding. Prior large ulcer that caused hemorrhage  in June 2023 almost healed. Biopsied. Also removed                            the clip.                           - The examination was otherwise normal. Recommendation:           - Patient has a contact number available for                            emergencies. The signs and symptoms of potential                            delayed complications were discussed with the                            patient. Return to normal activities tomorrow.                            Written discharge instructions were provided to the                            patient.                           - Continue present medications.                           - Await pathology results. He has been treated for                            H pylori. May still need another EGD later this                            year - also has not had screening colonoscopy. [name], MD 09/22/2021 9:04:14 AM This report has been signed electronically.

## 2021-09-22 NOTE — Patient Instructions (Addendum)
The ulcer is nearly healed. I took biopsies and also removed a clip that was nearby (from the treatment in hospital).  I will let you know results and recommendations.  Continue current medications at this time.  I appreciate the opportunity to care for you. [name], MD, FACG  YOU HAD AN ENDOSCOPIC PROCEDURE TODAY AT [facility]:   Refer to the procedure report that was given to you for any specific questions about what was found during the examination.  If the procedure report does not answer your questions, please call your gastroenterologist to clarify.  If you requested that your care partner not be given the details of your procedure findings, then the procedure report has been included in a sealed envelope for you to review at your convenience later.  YOU SHOULD EXPECT: Some feelings of bloating in the abdomen. Passage of more gas than usual.  Walking can help get rid of the air that was put into your GI tract during the procedure and reduce the bloating. If you had a lower endoscopy (such as a colonoscopy or flexible sigmoidoscopy) you may notice spotting of blood in your stool or on the toilet paper. If you underwent a bowel prep for your procedure, you may not have a normal bowel movement for a few days.  Please Note:  You might notice some irritation and congestion in your nose or some drainage.  This is from the oxygen used during your procedure.  There is no need for concern and it should clear up in a day or so.  SYMPTOMS TO REPORT IMMEDIATELY:  Following upper endoscopy (EGD)  Vomiting of blood or coffee ground material  New chest pain or pain under the shoulder blades  Painful or persistently difficult swallowing  New shortness of breath  Fever of 100F or higher  Black, tarry-looking stools  For urgent or emergent issues, a gastroenterologist can be reached at any hour by calling [phone]. Do not use MyChart messaging for urgent concerns.     DIET:  We do recommend a small meal at first, but then you may proceed to your regular diet.  Drink plenty of fluids but you should avoid alcoholic beverages for 24 hours.  ACTIVITY:  You should plan to take it easy for the rest of today and you should NOT DRIVE or use heavy machinery until tomorrow (because of the sedation medicines used during the test).    FOLLOW UP: Our staff will call the number listed on your records the next business day following your procedure.  We will call around 7:15- 8:00 am to check on you and address any questions or concerns that you may have regarding the information given to you following your procedure. If we do not reach you, we will leave a message.     If any biopsies were taken you will be contacted by phone or by letter within the next 1-3 weeks.  Please call us at [phone] if you have not heard about the biopsies in 3 weeks.    SIGNATURES/CONFIDENTIALITY: You and/or your care partner have signed paperwork which will be entered into your electronic medical record.  These signatures attest to the fact that that the information above on your After Visit Summary has been reviewed and is understood.  Full responsibility of the confidentiality of this discharge information lies with you and/or your care-partner.

## 2021-09-23 ENCOUNTER — Telehealth: Payer: Self-pay | Admitting: Pharmacy Technician

## 2021-09-23 ENCOUNTER — Telehealth: Payer: Self-pay

## 2021-09-23 NOTE — Telephone Encounter (Signed)
  Follow up Call-     09/22/2021    7:37 AM  Call back number  Post procedure Call Back phone  # [phone]  Permission to leave phone message Yes     Patient questions:  Do you have a fever, pain , or abdominal swelling? No. Pain Score  0 *  Have you tolerated food without any problems? Yes.    Have you been able to return to your normal activities? Yes.    Do you have any questions about your discharge instructions: Diet   No. Medications  No. Follow up visit  No.  Do you have questions or concerns about your Care? No.  Actions: * If pain score is 4 or above: No action needed, pain <4.

## 2021-09-23 NOTE — Telephone Encounter (Signed)
Dr. Allena Napoleon note:  Auth Submission: NO AUTH NEEDED Payer: [employer] Medication & CPT/J Code(s) submitted: Monoferric (Ferrci derisomaltose) [id number] Route of submission (phone, fax, portal):  Phone [phone] Fax # Auth type: Buy/Bill Units/visits requested: X1 DOSE Reference number: [id number] Approval from: 09/23/21 to 11/23/21   Patient will be scheduled as soon as possible.

## 2021-10-04 ENCOUNTER — Ambulatory Visit (INDEPENDENT_AMBULATORY_CARE_PROVIDER_SITE_OTHER): Payer: Medicaid Other

## 2021-10-04 VITALS — BP 137/82 | HR 56 | Temp 98.0°F | Resp 18 | Ht 70.0 in | Wt 176.0 lb

## 2021-10-04 DIAGNOSIS — K922 Gastrointestinal hemorrhage, unspecified: Secondary | ICD-10-CM

## 2021-10-04 DIAGNOSIS — D500 Iron deficiency anemia secondary to blood loss (chronic): Secondary | ICD-10-CM | POA: Diagnosis not present

## 2021-10-04 MED ORDER — SODIUM CHLORIDE 0.9 % IV SOLN
1000.0000 mg | Freq: Once | INTRAVENOUS | Status: AC
  Administered 2021-10-04: 1000 mg via INTRAVENOUS
  Filled 2021-10-04: qty 10

## 2021-10-04 NOTE — Progress Notes (Signed)
Diagnosis: Iron Deficiency Anemia  Provider:  Marshell Garfinkel MD  Procedure: Infusion  IV Type: Peripheral, IV Location: R Hand  Monoferric (Ferric Derisomaltose), Dose: 1000 mg  Infusion Start Time: 0926  Infusion Stop Time: 8864  Post Infusion IV Care: Observation period completed and Peripheral IV Discontinued  Discharge: Condition: Good, Destination: Home . AVS provided to patient.   Performed by:  Arnoldo Morale, RN

## 2021-11-23 ENCOUNTER — Other Ambulatory Visit: Payer: Medicaid Other

## 2021-11-23 ENCOUNTER — Ambulatory Visit: Payer: Medicaid Other | Admitting: Physician Assistant

## 2021-11-25 ENCOUNTER — Inpatient Hospital Stay: Payer: Medicaid Other | Admitting: Physician Assistant

## 2021-11-25 ENCOUNTER — Inpatient Hospital Stay: Payer: Medicaid Other

## 2021-12-06 ENCOUNTER — Ambulatory Visit: Payer: Medicaid Other | Admitting: Hematology and Oncology

## 2021-12-06 ENCOUNTER — Other Ambulatory Visit: Payer: Medicaid Other

## 2021-12-12 ENCOUNTER — Other Ambulatory Visit: Payer: Self-pay

## 2021-12-12 DIAGNOSIS — D500 Iron deficiency anemia secondary to blood loss (chronic): Secondary | ICD-10-CM

## 2021-12-13 ENCOUNTER — Inpatient Hospital Stay: Payer: Medicaid Other

## 2021-12-13 ENCOUNTER — Telehealth: Payer: Self-pay

## 2021-12-13 ENCOUNTER — Inpatient Hospital Stay: Payer: Medicaid Other | Admitting: Physician Assistant

## 2021-12-13 NOTE — Telephone Encounter (Signed)
Raymond [name] came for his appointment today and at check in he advised the receptionist his granddaughter has tested positive for RSV.  I spoke with pt and he was also symptomatic with a cough and chest congestion.  Pt was asked to reschedule to another day.  He said he would go home and take a covid test and monitor for s/s of RSV and call back to reschedule.

## 2021-12-21 ENCOUNTER — Other Ambulatory Visit: Payer: Self-pay

## 2021-12-21 ENCOUNTER — Inpatient Hospital Stay: Payer: Medicaid Other | Admitting: Physician Assistant

## 2021-12-21 ENCOUNTER — Inpatient Hospital Stay: Payer: Medicaid Other | Attending: Physician Assistant

## 2021-12-21 VITALS — BP 168/91 | HR 70 | Temp 97.7°F | Resp 18 | Ht 70.0 in | Wt 178.6 lb

## 2021-12-21 DIAGNOSIS — K254 Chronic or unspecified gastric ulcer with hemorrhage: Secondary | ICD-10-CM | POA: Diagnosis present

## 2021-12-21 DIAGNOSIS — D500 Iron deficiency anemia secondary to blood loss (chronic): Secondary | ICD-10-CM

## 2021-12-21 LAB — IRON AND IRON BINDING CAPACITY (CC-WL,HP ONLY)
Iron: 48 ug/dL (ref 45–182)
Saturation Ratios: 13 % — ABNORMAL LOW (ref 17.9–39.5)
TIBC: 377 ug/dL (ref 250–450)
UIBC: 329 ug/dL (ref 117–376)

## 2021-12-21 LAB — CBC WITH DIFFERENTIAL (CANCER CENTER ONLY)
Abs Immature Granulocytes: 0.08 10*3/uL — ABNORMAL HIGH (ref 0.00–0.07)
Basophils Absolute: 0 10*3/uL (ref 0.0–0.1)
Basophils Relative: 0 %
Eosinophils Absolute: 0.1 10*3/uL (ref 0.0–0.5)
Eosinophils Relative: 1 %
HCT: 46.9 % (ref 39.0–52.0)
Hemoglobin: 14.9 g/dL (ref 13.0–17.0)
Immature Granulocytes: 1 %
Lymphocytes Relative: 16 %
Lymphs Abs: 0.9 10*3/uL (ref 0.7–4.0)
MCH: 25.8 pg — ABNORMAL LOW (ref 26.0–34.0)
MCHC: 31.8 g/dL (ref 30.0–36.0)
MCV: 81.3 fL (ref 80.0–100.0)
Monocytes Absolute: 0.7 10*3/uL (ref 0.1–1.0)
Monocytes Relative: 13 %
Neutro Abs: 3.9 10*3/uL (ref 1.7–7.7)
Neutrophils Relative %: 69 %
Platelet Count: 256 10*3/uL (ref 150–400)
RBC: 5.77 MIL/uL (ref 4.22–5.81)
RDW: 18.9 % — ABNORMAL HIGH (ref 11.5–15.5)
WBC Count: 5.8 10*3/uL (ref 4.0–10.5)
nRBC: 0 % (ref 0.0–0.2)

## 2021-12-21 LAB — CMP (CANCER CENTER ONLY)
ALT: 16 U/L (ref 0–44)
AST: 14 U/L — ABNORMAL LOW (ref 15–41)
Albumin: 4.4 g/dL (ref 3.5–5.0)
Alkaline Phosphatase: 27 U/L — ABNORMAL LOW (ref 38–126)
Anion gap: 4 — ABNORMAL LOW (ref 5–15)
BUN: 13 mg/dL (ref 8–23)
CO2: 32 mmol/L (ref 22–32)
Calcium: 10.4 mg/dL — ABNORMAL HIGH (ref 8.9–10.3)
Chloride: 106 mmol/L (ref 98–111)
Creatinine: 1.11 mg/dL (ref 0.61–1.24)
GFR, Estimated: >60 mL/min (ref >60)
Glucose, Bld: 93 mg/dL (ref 70–99)
Potassium: 4.3 mmol/L (ref 3.5–5.1)
Sodium: 142 mmol/L (ref 135–145)
Total Bilirubin: 0.3 mg/dL (ref 0.3–1.2)
Total Protein: 7 g/dL (ref 6.5–8.1)

## 2021-12-21 LAB — FERRITIN: Ferritin: 98 ng/mL (ref 24–336)

## 2021-12-21 LAB — RETIC PANEL
Immature Retic Fract: 14.1 % (ref 2.3–15.9)
RBC.: 5.7 MIL/uL (ref 4.22–5.81)
Retic Count, Absolute: 36.5 10*3/uL (ref 19.0–186.0)
Retic Ct Pct: 0.6 % (ref 0.4–3.1)
Reticulocyte Hemoglobin: 31.3 pg (ref >27.9)

## 2021-12-22 ENCOUNTER — Telehealth: Payer: Self-pay | Admitting: Physician Assistant

## 2021-12-22 NOTE — Telephone Encounter (Signed)
Called patient to schedule labs and f/u. Patient notified of new appointments.

## 2021-12-22 NOTE — Progress Notes (Signed)
[facility] [phone]   [phone]  PROGRESS NOTE  Patient Care Team: Bonsu, Talmage Coin, DO as PCP - General (Internal Medicine) [name], MD as PCP - Cardiology (Cardiology) Bonsu, Talmage Coin, DO (Internal Medicine)  Hematological/Oncological History 1)  06/22/2021-06/26/2021: Admitted for hematemesis and found to have nonbleeding gastric ulcer with adherent clot on EGD, Received 2 units of PRBC.   2)  06/29/2021-07/02/2021 patient admitted for acute GI bleed secondary to gastric ulcer from H. pylori.  06/29/2021 EGD with normal esophagus, large amount of food residue and nonbleeding gastric ulcer with adherent clot which was injected and clipped.  He received 1 units of PRBC  3) 07/27/2021: Labs show Hgb 9.6, MCV 75.8, Iron 25, saturation 4.6%, ferritin 7.1, TIBC 543.2.   4) 08/24/2021: Establish care with [facility] Hematology  5) 10/04/2021: Received IV monoferric 1000 mg x 1 dose  CHIEF COMPLAINTS/PURPOSE OF CONSULTATION:  Iron deficiency anemia 2/2 GI bleed  HISTORY OF PRESENTING ILLNESS:  [name] 61 y.o. male returns for a follow up for iron deficiency anemia. He was last seen on 08/24/2021 to establish care. In the interim, he receive IV iron infusion . He is unaccompanied for this visit.  On exam today, Mr. Schnitzler reports that his energy levels are fairly stable. He continues to complete all his ADLs independently. He has a good appetite and denies any weight changes. He denies easy bruising or signs of active bleeding. He is otherwise doing well. He denies fevers, chills, night sweats, shortness of breath, chest pain, cough, nausea, vomiting, diarrhea or constipation.  He has no other complaints.  Rest of the 10 point ROS is below.  MEDICAL HISTORY:  Past Medical History:  Diagnosis Date   Acute gastric ulcer with hemorrhage 06/28/2021   Acute respiratory failure (HCC)    AKI (acute kidney injury) ([place])    Arthritis    At risk for sleep apnea     STOP-BANG= 4    SENT TO PCP 07-09-2014   BPH (benign prostatic hypertrophy)    Cardiac arrest ([place]) 06/14/2019   Dental caries    periodontitis   Depression    Elevated PSA    Helicobacter pylori gastritis 06/28/2021   History of concussion    yrs ago-- no LOC-- no residual   History of depression    Hypertension    Nocturia    Prostate cancer ([place])    Wears glasses     SURGICAL HISTORY: Past Surgical History:  Procedure Laterality Date   APPENDECTOMY  age 52   BIOPSY  06/23/2021   Procedure: BIOPSY;  Surgeon: [name], MD;  Location: [facility] END[facility]COPY;  Service: Gastroenterology;;   CORONARY STENT INTERVENTION N/A 06/14/2019   Procedure: CORONARY STENT INTERVENTION;  Surgeon: [name], MD;  Location: [place] CV LAB;  Service: Cardiovascular;  Laterality: N/A;   CORONARY/GRAFT ACUTE MI REVASCULARIZATION N/A 06/14/2019   Procedure: Coronary/Graft Acute MI Revascularization;  Surgeon: [name], MD;  Location: [place] [facility];  Service: Cardiovascular;  Laterality: N/A;   CYSTOSCOPY WITH URETHRAL DILATATION N/A 06/15/2019   Procedure: CYSTOSCOPY WITH BALLOON URETHRAL DILATATION;  Surgeon: [name], MD;  Location: [facility];  Service: Urology;  Laterality: N/A;   ESOPHAGOGASTRODUODENOSCOPY (EGD) WITH PROPOFOL N/A 06/23/2021   Procedure: ESOPHAGOGASTRODUODENOSCOPY (EGD) WITH PROPOFOL;  Surgeon: [name], MD;  Location: [facility];  Service: Gastroenterology;  Laterality: N/A;   ESOPHAGOGASTRODUODENOSCOPY (EGD) WITH PROPOFOL N/A 06/29/2021   Procedure: ESOPHAGOGASTRODUODENOSCOPY (EGD)  WITH PROPOFOL;  Surgeon: Sharyn Creamer, MD;  Location: [facility];  Service: Gastroenterology;  Laterality: N/A;   HEMOSTASIS CLIP PLACEMENT  06/29/2021   Procedure: HEMOSTASIS CLIP PLACEMENT;  Surgeon: Sharyn Creamer, MD;  Location: [facility] ENDOSCOPY;  Service: Gastroenterology;;   LEFT HEART CATH AND CORONARY ANGIOGRAPHY N/A 06/14/2019   Procedure: LEFT HEART CATH AND  CORONARY ANGIOGRAPHY;  Surgeon: [name], MD;  Location: [facility];  Service: Cardiovascular;  Laterality: N/A;   MULTIPLE EXTRACTIONS WITH ALVEOLOPLASTY N/A 10/12/2017   Procedure: MULTIPLE EXTRACTION WITH ALVEOLOPLASTY;  Surgeon: [name], DDS;  Location: [facility];  Service: Oral Surgery;  Laterality: N/A;   PELVIC LYMPH NODE DISSECTION Bilateral 11/04/2018   Procedure: PELVIC LYMPH NODE DISSECTION;  Surgeon: [name], MD;  Location: [facility];  Service: Urology;  Laterality: Bilateral;   PROSTATE BIOPSY N/A 07/15/2014   Procedure: SATURATION BIOPSY TRANSRECTAL ULTRASONIC PROSTATE (TUBP);  Surgeon: [name], MD;  Location: [facility];  Service: Urology;  Laterality: N/A;   PROSTATE BIOPSY N/A 08/07/2018   Procedure: BIOPSY TRANSRECTAL ULTRASONIC PROSTATE (TUBP);  Surgeon: [name], MD;  Location: [facility];  Service: Urology;  Laterality: N/A;  ONLY NEEDS 30 MIN   RIGHT KNEE ARTHROSCOPY /  MENISECTOMY/  DEBRIDEMENT OF CHONDROMALACIA PATELLA AND MEDIAL SHELF PLICA/  SUBCHONDROPLASTY  06/10/2010   ROBOT ASSISTED LAPAROSCOPIC RADICAL PROSTATECTOMY N/A 11/04/2018   Procedure: XI ROBOTIC ASSISTED LAPAROSCOPIC RADICAL PROSTATECTOMY;  Surgeon: [name], MD;  Location: [facility];  Service: Urology;  Laterality: N/A;   SCHLEROTHERAPY  06/29/2021   Procedure: SCHLEROTHERAPy;  Surgeon: Sharyn Creamer, MD;  Location: [facility] ENDOSCOPY;  Service: Gastroenterology;;   TOTAL KNEE ARTHROPLASTY  05/31/2011   Procedure: TOTAL KNEE ARTHROPLASTY;  Surgeon: [name], MD;  Location: [place];  Service: Orthopedics;  Laterality: Right;  with revision stem   UPPER GASTROINTESTINAL ENDOSCOPY      SOCIAL HISTORY: Social History   Socioeconomic History   Marital status: Single    Spouse name: Not on file   Number of children: 4   Years of education: Not on file   Highest education level: Not on file  Occupational History     Comment: disability  Tobacco Use   Smoking status: Some Days    Packs/day: 0.50    Years: 4.00    Total pack years: 2.00    Types: Cigarettes   Smokeless tobacco: Never  Vaping Use   Vaping Use: Never used  Substance and Sexual Activity   Alcohol use: Not Currently   Drug use: Never    Comment: hx marijuana use- none recently states on 10/30/2018   Sexual activity: Yes  Other Topics Concern   Not on file  Social History Narrative   ** Merged History Encounter **       His daughter is a Marine scientist at [employer], who works night shift.   Social Determinants of Health   Financial Resource Strain: Not on file  Food Insecurity: Not on file  Transportation Needs: Not on file  Physical Activity: Not on file  Stress: Not on file  Social Connections: Not on file  Intimate Partner Violence: Not on file    FAMILY HISTORY: Family History  Problem Relation Age of Onset   Leukemia Brother    Prostate cancer Neg Hx    Colon cancer Neg Hx    Breast cancer Neg Hx    Esophageal cancer Neg Hx  Rectal cancer Neg Hx    Stomach cancer Neg Hx     ALLERGIES:  has No Known Allergies.  MEDICATIONS:  Current Outpatient Medications  Medication Sig Dispense Refill   acetaminophen (TYLENOL) 500 MG tablet Take 500 mg by mouth daily as needed for headache or mild pain.     aspirin 81 MG chewable tablet Chew 1 tablet (81 mg total) by mouth daily. 90 tablet 1   atorvastatin (LIPITOR) 80 MG tablet Take 1 tablet (80 mg total) by mouth daily. 90 tablet 1   EYSUVIS 0.25 % SUSP Apply 1 drop to eye 4 (four) times daily.     fenofibrate (TRICOR) 145 MG tablet Take 1 tablet (145 mg total) by mouth daily. 30 tablet 11   ferrous sulfate 325 (65 FE) MG EC tablet Take 1 tablet (325 mg total) by mouth daily with breakfast. 30 tablet 3   gemfibrozil (LOPID) 600 MG tablet Take 600 mg by mouth 2 (two) times daily.     ibuprofen (ADVIL) 800 MG tablet Take 800 mg by mouth every 6 (six) hours as needed.      lisinopril (ZESTRIL) 20 MG tablet Take 20 mg by mouth daily.     metoprolol tartrate (LOPRESSOR) 25 MG tablet Take 0.5 tablets (12.5 mg total) by mouth 2 (two) times daily.     pantoprazole (PROTONIX) 40 MG tablet Take 1 tablet (40 mg total) by mouth 2 (two) times daily. 120 tablet 1   No current facility-administered medications for this visit.   Facility-Administered Medications Ordered in Other Visits  Medication Dose Route Frequency Provider Last Rate Last Admin   sodium phosphate (FLEET) 7-19 GM/118ML enema 1 enema  1 enema Rectal Once Winter, Conception Oms, MD        REVIEW OF SYSTEMS:   Constitutional: ( - ) fevers, ( - )  chills , ( - ) night sweats Eyes: ( - ) blurriness of vision, ( - ) double vision, ( - ) watery eyes Ears, nose, mouth, throat, and face: ( - ) mucositis, ( - ) sore throat Respiratory: ( - ) cough, ( - ) dyspnea, ( - ) wheezes Cardiovascular: ( - ) palpitation, ( - ) chest discomfort, ( - ) lower extremity swelling Gastrointestinal:  ( - ) nausea, ( - ) heartburn, ( - ) change in bowel habits Skin: ( - ) abnormal skin rashes Lymphatics: ( - ) new lymphadenopathy, ( - ) easy bruising Neurological: ( - ) numbness, ( - ) tingling, ( - ) new weaknesses Behavioral/Psych: ( - ) mood change, ( - ) new changes  All other systems were reviewed with the patient and are negative.  PHYSICAL EXAMINATION: ECOG PERFORMANCE STATUS: 0 - Asymptomatic  Vitals:   12/21/21 1119  BP: (!) 168/91  Pulse: 70  Resp: 18  Temp: 97.7 F (36.5 C)  SpO2: 100%   Filed Weights   12/21/21 1119  Weight: 178 lb 9.6 oz (81 kg)    GENERAL: well appearing male in NAD  SKIN: skin color, texture, turgor are normal, no rashes or significant lesions EYES: conjunctiva are pink and non-injected, sclera clear LUNGS: clear to auscultation and percussion with normal breathing effort HEART: regular rate & rhythm and no murmurs and no lower extremity edema Musculoskeletal: no cyanosis  of digits and no clubbing  PSYCH: alert & oriented x 3, fluent speech NEURO: no focal motor/sensory deficits  LABORATORY DATA:  I have reviewed the data as listed    Latest Ref Rng &  Units 12/21/2021    9:38 AM 09/21/2021   10:16 AM 08/24/2021    9:50 AM  CBC  WBC 4.0 - 10.5 K/uL 5.8  3.7  4.8   Hemoglobin 13.0 - 17.0 g/dL 14.9  11.3  9.8   Hematocrit 39.0 - 52.0 % 46.9  39.1  32.0   Platelets 150 - 400 K/uL 256  212  185        Latest Ref Rng & Units 12/21/2021    9:38 AM 09/21/2021   10:16 AM 08/24/2021    9:50 AM  CMP  Glucose 70 - 99 mg/dL 93  113  103   BUN 8 - 23 mg/dL '13  9  12   '$ Creatinine 0.61 - 1.24 mg/dL 1.11  0.86  0.96   Sodium 135 - 145 mmol/L 142  141  139   Potassium 3.5 - 5.1 mmol/L 4.3  4.0  3.9   Chloride 98 - 111 mmol/L 106  109  111   CO2 22 - 32 mmol/L 32  28  26   Calcium 8.9 - 10.3 mg/dL 10.4  9.7  9.3   Total Protein 6.5 - 8.1 g/dL 7.0  6.7  7.1   Total Bilirubin 0.3 - 1.2 mg/dL 0.3  0.6  0.5   Alkaline Phos 38 - 126 U/L 27  35  31   AST 15 - 41 U/L '14  19  13   '$ ALT 0 - 44 U/L '16  21  11    '$ ASSESSMENT & PLAN BALDO HUFNAGLE is a 61 y.o. male returns for a follow up for iron deficiency anemia.   #Iron deficiency anemia: --etiology felt to be GI bleed 2/2 gastric ulcer from H.pylori.  --completed treatment for H.pylori infection and continues on protonix 40 mg twice daily.  --underwent repeat EGD on 09/22/2021 that showed one non-bleeding cratered gastric ulcer. Pathology negative for H.pylori, dysplasia or carcinoma.  --received IV monoferric 1000 mg x 1 dose on 09/22/2021.  --Labs today show that anemia has resolved with Hgb 14.9, MCV 81.3. Iron panel shows improvement of iron levels with serum iron 48, TIBC 377, saturation 13%, ferritin 98.  --No need for additional IV iron at this time. Continue to take ferrous sulfate 325 mg once daily with a source of vitamin C --Continue to incorporate iron rich foods into diet.  --RTC in 3 months with lab only  and 6 months with labs/follow up.   No orders of the defined types were placed in this encounter.   All questions were answered. The patient knows to call the clinic with any problems, questions or concerns.  I have spent a total of 25 minutes minutes of face-to-face and non-face-to-face time, preparing to see the [place] a medically appropriate examination, counseling and educating the patient, ordering medications/tests/procedures, documenting clinical information in the electronic health record, and care coordination.   Dede Query, PA-C Department of Hematology/Oncology [place] at [facility] Phone: [phone]

## 2022-03-24 ENCOUNTER — Other Ambulatory Visit: Payer: Self-pay

## 2022-03-24 ENCOUNTER — Inpatient Hospital Stay: Payer: Medicaid Other | Attending: Physician Assistant

## 2022-03-24 DIAGNOSIS — D500 Iron deficiency anemia secondary to blood loss (chronic): Secondary | ICD-10-CM | POA: Diagnosis present

## 2022-03-24 LAB — CBC WITH DIFFERENTIAL (CANCER CENTER ONLY)
Abs Immature Granulocytes: 0.02 10*3/uL (ref 0.00–0.07)
Basophils Absolute: 0 10*3/uL (ref 0.0–0.1)
Basophils Relative: 1 %
Eosinophils Absolute: 0.1 10*3/uL (ref 0.0–0.5)
Eosinophils Relative: 1 %
HCT: 44.9 % (ref 39.0–52.0)
Hemoglobin: 14.3 g/dL (ref 13.0–17.0)
Immature Granulocytes: 0 %
Lymphocytes Relative: 24 %
Lymphs Abs: 1.3 10*3/uL (ref 0.7–4.0)
MCH: 27.1 pg (ref 26.0–34.0)
MCHC: 31.8 g/dL (ref 30.0–36.0)
MCV: 85 fL (ref 80.0–100.0)
Monocytes Absolute: 0.9 10*3/uL (ref 0.1–1.0)
Monocytes Relative: 17 %
Neutro Abs: 3.1 10*3/uL (ref 1.7–7.7)
Neutrophils Relative %: 57 %
Platelet Count: 185 10*3/uL (ref 150–400)
RBC: 5.28 MIL/uL (ref 4.22–5.81)
RDW: 17 % — ABNORMAL HIGH (ref 11.5–15.5)
WBC Count: 5.5 10*3/uL (ref 4.0–10.5)
nRBC: 0 % (ref 0.0–0.2)

## 2022-03-24 LAB — IRON AND IRON BINDING CAPACITY (CC-WL,HP ONLY)
Iron: 96 ug/dL (ref 45–182)
Saturation Ratios: 23 % (ref 17.9–39.5)
TIBC: 417 ug/dL (ref 250–450)
UIBC: 321 ug/dL (ref 117–376)

## 2022-03-24 LAB — FERRITIN: Ferritin: 177 ng/mL (ref 24–336)

## 2022-03-27 ENCOUNTER — Telehealth: Payer: Self-pay

## 2022-03-27 NOTE — Telephone Encounter (Signed)
-----   Message from Lincoln Brigham, PA-C sent at 03/27/2022 10:29 AM EDT ----- Please notify patient that hgb and iron levels are normal. No need for IV iron at this time.

## 2022-03-27 NOTE — Telephone Encounter (Signed)
LM for pt with results.  

## 2022-04-04 ENCOUNTER — Encounter: Payer: Self-pay | Admitting: Physical Therapy

## 2022-04-04 ENCOUNTER — Other Ambulatory Visit: Payer: Self-pay

## 2022-04-04 ENCOUNTER — Ambulatory Visit: Payer: Medicaid Other | Attending: Physician Assistant | Admitting: Physical Therapy

## 2022-04-04 DIAGNOSIS — M6281 Muscle weakness (generalized): Secondary | ICD-10-CM | POA: Diagnosis present

## 2022-04-04 DIAGNOSIS — M5459 Other low back pain: Secondary | ICD-10-CM | POA: Diagnosis not present

## 2022-04-04 NOTE — Therapy (Signed)
OUTPATIENT PHYSICAL THERAPY EVALUATION   Patient Name: Raymond [name] MRN: [id number] DOB:22-Jul-1960, 62 y.o., male Today's Date: 04/04/2022   END OF SESSION:  PT End of Session - 04/04/22 0822     Visit Number 1    Number of Visits 9    Date for PT Re-Evaluation 05/30/22    Authorization Type [employer]    PT Start Time [phone]    PT Stop Time 0920    PT Time Calculation (min) 45 min    Activity Tolerance Patient tolerated treatment well    Behavior During Therapy [facility] for tasks assessed/performed             Past Medical History:  Diagnosis Date   Acute gastric ulcer with hemorrhage 06/28/2021   Acute respiratory failure (HCC)    AKI (acute kidney injury) ([name])    Arthritis    At risk for sleep apnea    STOP-BANG= 4    SENT TO PCP 07-09-2014   BPH (benign prostatic hypertrophy)    Cardiac arrest ([place]) 06/14/2019   Dental caries    periodontitis   Depression    Elevated PSA    Helicobacter pylori gastritis 06/28/2021   History of concussion    yrs ago-- no LOC-- no residual   History of depression    Hypertension    Nocturia    Prostate cancer ([name])    Wears glasses    Past Surgical History:  Procedure Laterality Date   APPENDECTOMY  age 60   BIOPSY  06/23/2021   Procedure: BIOPSY;  Surgeon: [name], MD;  Location: [facility] ENDOSCOPY;  Service: Gastroenterology;;   CORONARY STENT INTERVENTION N/A 06/14/2019   Procedure: CORONARY STENT INTERVENTION;  Surgeon: [name], MD;  Location: [place] CV LAB;  Service: Cardiovascular;  Laterality: N/A;   CORONARY/GRAFT ACUTE MI REVASCULARIZATION N/A 06/14/2019   Procedure: Coronary/Graft Acute MI Revascularization;  Surgeon: [name], MD;  Location: [place] CV LAB;  Service: Cardiovascular;  Laterality: N/A;   CYSTOSCOPY WITH URETHRAL DILATATION N/A 06/15/2019   Procedure: CYSTOSCOPY WITH BALLOON URETHRAL DILATATION;  Surgeon: [name], MD;  Location: [place];  Service: Urology;   Laterality: N/A;   ESOPHAGOGASTRODUODENOSCOPY (EGD) WITH PROPOFOL N/A 06/23/2021   Procedure: ESOPHAGOGASTRODUODENOSCOPY (EGD) WITH PROPOFOL;  Surgeon: [name], MD;  Location: [place];  Service: Gastroenterology;  Laterality: N/A;   ESOPHAGOGASTRODUODENOSCOPY (EGD) WITH PROPOFOL N/A 06/29/2021   Procedure: ESOPHAGOGASTRODUODENOSCOPY (EGD) WITH PROPOFOL;  Surgeon: Sharyn Creamer, MD;  Location: [place];  Service: Gastroenterology;  Laterality: N/A;   HEMOSTASIS CLIP PLACEMENT  06/29/2021   Procedure: HEMOSTASIS CLIP PLACEMENT;  Surgeon: Sharyn Creamer, MD;  Location[facility] ENDOSCOPY;  Service: Gastroenterology;;   LEFT HEART CATH AND CORONARY ANGIOGRAPHY N/A 06/14/2019   Procedure: LEFT HEART CATH AND CORONARY ANGIOGRAPHY;  Surgeon: [name], MD;  Location: [place] CV LAB;  Service: Cardiovascular;  Laterality: N/A;   MULTIPLE EXTRACTIONS WITH ALVEOLOPLASTY N/A 10/12/2017   Procedure: MULTIPLE EXTRACTION WITH ALVEOLOPLASTY;  Surgeon: [name], DDS;  Location: [place];  Service: Oral Surgery;  Laterality: N/A;   PELVIC LYMPH NODE DISSECTION Bilateral 11/04/2018   Procedure: PELVIC LYMPH NODE DISSECTION;  Surgeon: [name], MD;  Location: [facility];  Service: Urology;  Laterality: Bilateral;   PROSTATE BIOPSY N/A 07/15/2014   Procedure: SATURATION BIOPSY TRANSRECTAL ULTRASONIC PROSTATE (TUBP);  Surgeon: [name], MD;  Location: [facility];  Service: Urology;  Laterality: N/A;  PROSTATE BIOPSY N/A 08/07/2018   Procedure: BIOPSY TRANSRECTAL ULTRASONIC PROSTATE (TUBP);  Surgeon: [name], MD;  Location: [facility];  Service: Urology;  Laterality: N/A;  ONLY NEEDS 30 MIN   RIGHT KNEE ARTHROSCOPY /  MENISECTOMY/  DEBRIDEMENT OF CHONDROMALACIA PATELLA AND MEDIAL SHELF PLICA/  SUBCHONDROPLASTY  06/10/2010   ROBOT ASSISTED LAPAROSCOPIC RADICAL PROSTATECTOMY N/A 11/04/2018   Procedure: XI ROBOTIC ASSISTED LAPAROSCOPIC  RADICAL PROSTATECTOMY;  Surgeon: [name], MD;  Location: [facility];  Service: Urology;  Laterality: N/A;   SCHLEROTHERAPY  06/29/2021   Procedure: SCHLEROTHERAPy;  Surgeon: Sharyn Creamer, MD;  Location: [facility] ENDOSCOPY;  Service: Gastroenterology;;   TOTAL KNEE ARTHROPLASTY  05/31/2011   Procedure: TOTAL KNEE ARTHROPLASTY;  Surgeon: [name], MD;  Location: [place];  Service: Orthopedics;  Laterality: Right;  with revision stem   UPPER GASTROINTESTINAL ENDOSCOPY     Patient Active Problem List   Diagnosis Date Noted   Iron deficiency anemia due to chronic blood loss 09/22/2021   Absolute anemia 08/24/2021   Acute upper GI bleeding 06/29/2021   Chronic combined systolic (congestive) and diastolic (congestive) heart failure ([name]) 06/29/2021   Dyslipidemia 06/29/2021   Acute gastric ulcer with hemorrhage [id number]   Helicobacter pylori gastritis 06/28/2021   GI bleeding 06/23/2021   Hematemesis with nausea    Chronic gastric ulcer with hemorrhage    Cardiac arrest ([name]) 06/14/2019   Seizure ([name])    Prostate cancer ([name]) 11/04/2018   Malignant neoplasm of prostate ([name]) 09/13/2018   EPIDERMOID CYST 09/07/2009   LYMPHADENOPATHY 09/07/2009   ABSCESS, TOOTH 05/21/2009   HYPERTRIGLYCERIDEMIA 04/19/2009   DEPRESSION 03/24/2009   COCAINE ABUSE 02/15/2009   Alcohol abuse 02/12/2009   TOBACCO ABUSE 02/12/2009   Essential hypertension 02/12/2009   OSTEOARTHRITIS 02/12/2009   BACK PAIN, LUMBAR 02/12/2009   URINALYSIS, ABNORMAL 02/12/2009    PCP: Gevena Mart, DO  REFERRING PROVIDER: [name], PA  REFERRING DIAG: Low back pain  Rationale for Evaluation and Treatment: Rehabilitation  THERAPY DIAG:  Other low back pain  Muscle weakness (generalized)  ONSET DATE: Ongoing for a couple of months   SUBJECTIVE:                                                                                                                                                                                           SUBJECTIVE STATEMENT: Patient report right sided low back pain that radiates down right leg to his foot. Ongoing for a few months. He states he was in an accident a long time ago but cannot remember a mechanism, just started having pain  a couple months ago. He reports feels like a numbness going down the right leg to the foot. Sometimes it can come on if he is sitting or lying down and he has to change positions. Patient states sometimes standing for too long can increase the pain.    PERTINENT HISTORY:  See PMH above  PAIN:  Are you having pain? Yes:  NPRS scale: 4-5/10 (pain can get up to 8/10) Pain location: Right lower back, buttock, right leg to the foot Pain description: Dull, achy, numbness Aggravating factors: Sitting, lying down, standing for too long Relieving factors: Medication  PRECAUTIONS: None  WEIGHT BEARING RESTRICTIONS: No  FALLS:  Has patient fallen in last 6 months? 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, has to stand extended periods  PLOF: Independent  PATIENT GOALS: Pain relief   OBJECTIVE:  PATIENT SURVEYS:  Modified Oswestry 46% (23/50)   SCREENING FOR RED FLAGS: Negative  COGNITION: Overall cognitive status: Within functional limits for tasks assessed     SENSATION: Patient reports sensation deficit right posterolateral leg and foot  MUSCLE LENGTH: Hamstring slightly limited piriformis and hip flexor / quad limitation  POSTURE:   Rounded shoulder posture,   PALPATION: Tender to palpation bilateral lumbar paraspinals, right gluteal and piriformis region  LUMBAR ROM:   AROM eval  Flexion WFL - lumbar pain with rising from bent position  Extension 50% - right leg numbness  Right lateral flexion WFL  Left lateral flexion WFL - right lumbar pulling  Right rotation 75% - right leg numbness  Left rotation 75%   (Blank  rows = not tested)  LOWER EXTREMITY MMT:     MMT Right eval Left eval  Hip flexion 4 4  Hip extension 2 3  Hip abduction 3 4  Hip adduction    Hip internal rotation    Hip external rotation    Knee flexion 4+ 5  Knee extension 5 5  Ankle dorsiflexion 5 5  Ankle plantarflexion    Ankle inversion    Ankle eversion     (Blank rows = not tested)  LOWER EXTREMITY ROM:     LE ROM grossly WFL  LUMBAR SPECIAL TESTS:  Slump positive on right  FUNCTIONAL TESTS:  DLLT: loss of lumbar control and report of low back pain at 45 deg  GAIT: Assistive device utilized: None Level of assistance: Complete Independence Comments: Antalgic on right   TODAY'S TREATMENT:     [facility] Adult PT Treatment:                                                DATE: 04/04/2022 Therapeutic Exercise: Marcello Moores stretch x 60 sec Piriformis stretch 2 x 30 sec Bridge x 10 Side clamshell with red x 10 SMFR with tennis ball to right lumbar paraspinals and gluteal/piriformis region  PATIENT EDUCATION:  Education details: Exam findings, POC, HEP Person educated: Patient Education method: Explanation, Demonstration, Tactile cues, Verbal cues, and Handouts Education comprehension: verbalized understanding, returned demonstration, verbal cues required, tactile cues required, and needs further education  HOME EXERCISE PROGRAM: Access Code: [email or web address]    ASSESSMENT: CLINICAL IMPRESSION: Patient is a 62 y.o. male who was seen today for physical therapy evaluation and treatment for chronic right sided low back and radicular pain. He demonstrates limitations in  lumbar motion with possible directional preference for flexion, core and hip strength deficits and gross flexibility deficits likely contributing to his symptoms.    OBJECTIVE IMPAIRMENTS: Abnormal gait, decreased activity tolerance, decreased ROM, decreased strength, impaired flexibility, improper body mechanics, prosthetic dependency , and pain.   ACTIVITY  LIMITATIONS: carrying, lifting, bending, sitting, standing, squatting, and locomotion level  PARTICIPATION LIMITATIONS: meal prep, cleaning, laundry, community activity, and occupation  PERSONAL FACTORS: Fitness, Past/current experiences, Time since onset of injury/illness/exacerbation, and 3+ comorbidities: see PMH above  are also affecting patient's functional outcome.   REHAB POTENTIAL: Good  CLINICAL DECISION MAKING: Stable/uncomplicated  EVALUATION COMPLEXITY: Low   GOALS: Goals reviewed with patient? Yes  SHORT TERM GOALS: Target date: 05/02/2022  Patient will be I with initial HEP in order to progress with therapy. Baseline: HEP provided at eval Goal status: INITIAL  2.  Patient will report right low back and leg pain </= 5/10 with activity in order to reduce functional limitations Baseline: right low back and leg pain 8/10 Goal status: INITIAL  3.  Patient will report 50% improvement in right leg numbness to improve his sitting, lying, and standing tolerance Baseline: patient reports constant right leg and foot numbness Goal status: INITIAL  LONG TERM GOALS: Target date: 05/30/2022  Patient will be I with final HEP to maintain progress from PT. Baseline: HEP provided at eval Goal status: INITIAL  2.  Patient will report Modified Oswestry </= 24% in order to indicate an improvement in functional ability with lifting and performing household or work related tasks Baseline: 46% (23/50) Goal status: INITIAL  3.  Patient will demonstrate DLLT </= 30 deg in order to indicate improvement in core strength and lumbopelvic control to reduce pain with lifting Baseline: DLLT 45 deg with increased low back pain Goal status: INITIAL  4.  Patient will demonstrate right hip strength >/= 4/5 MMT in order to improve standing tolerance and completing household activities Baseline: strength deficits 2-3 /5 MMT Goal status: INITIAL  5. Patient will report right low back and leg pain  </= 3/10 with activity in order to reduce functional limitations and return to prior level of function  Baseline: 8/10 pain   Goal status: INITIAL   PLAN: PT FREQUENCY: 1x/week  PT DURATION: 8 weeks  PLANNED INTERVENTIONS: Therapeutic exercises, Therapeutic activity, Neuromuscular re-education, Balance training, Gait training, Patient/Family education, Self Care, Joint mobilization, Joint manipulation, Aquatic Therapy, Dry Needling, Spinal mobilization, Cryotherapy, Moist heat, Manual therapy, and Re-evaluation.  PLAN FOR NEXT SESSION: Review HEP and progress PRN, manual/dry needling for lumbar paraspinals and right gluteal/piriformis region, progress core stabilization and hip strengthening, nerve floss exercise PRN   Hilda Blades, PT, DPT, LAT, ATC 04/04/22  9:48 AM Phone: [phone] Fax: [phone]     Check all possible CPT codes: 97164 - PT Re-evaluation, 97110- Therapeutic Exercise, [phone]- Neuro Re-education, [phone] - Gait Training, [phone] - Manual Therapy, 97530 - Therapeutic Activities, [phone] - Self Care, and [phone] - Aquatic therapy    Check all conditions that are expected to impact treatment: Musculoskeletal disorders and Social determinants of health   If treatment provided at initial evaluation, no treatment charged due to lack of authorization.

## 2022-04-04 NOTE — Patient Instructions (Signed)
Access Code: [email or web address] URL: [email or web address] Date: 04/04/2022 Prepared by: Hilda Blades  Exercises - [place] [employer]  - 2 x daily - 2 reps - 1 minute hold - Supine Piriformis Stretch with Foot on Ground  - 2 x daily - 3 reps - 30 seconds hold - Bridge  - 1 x daily - 2 sets - 10 reps - Clam with Resistance  - 1 x daily - 2 sets - 10 reps - Standing Glute Med Mobilization with Small Ball on Wall  - a couple minutes hold

## 2022-04-12 ENCOUNTER — Ambulatory Visit: Payer: Medicaid Other | Admitting: Physical Therapy

## 2022-04-19 ENCOUNTER — Ambulatory Visit: Payer: Medicaid Other | Attending: Physician Assistant | Admitting: Physical Therapy

## 2022-04-19 ENCOUNTER — Telehealth: Payer: Self-pay | Admitting: Physical Therapy

## 2022-04-19 NOTE — Telephone Encounter (Signed)
Contacted patient regarding no show to appointment. Left voicemail with reminder of next appointment as well as reminder of attendance policy.

## 2022-04-26 ENCOUNTER — Ambulatory Visit: Payer: Medicaid Other | Admitting: Physical Therapy

## 2022-05-03 ENCOUNTER — Ambulatory Visit: Payer: Medicaid Other | Admitting: Physical Therapy

## 2022-06-21 ENCOUNTER — Other Ambulatory Visit: Payer: Self-pay | Admitting: Physician Assistant

## 2022-06-21 DIAGNOSIS — D500 Iron deficiency anemia secondary to blood loss (chronic): Secondary | ICD-10-CM

## 2022-06-22 ENCOUNTER — Telehealth: Payer: Self-pay | Admitting: Physician Assistant

## 2022-06-22 NOTE — Telephone Encounter (Signed)
Patient is aware of rescheduled appointment per patients request.

## 2022-06-23 ENCOUNTER — Ambulatory Visit: Payer: Medicaid Other | Admitting: Physician Assistant

## 2022-06-23 ENCOUNTER — Other Ambulatory Visit: Payer: Medicaid Other

## 2022-07-10 ENCOUNTER — Other Ambulatory Visit (HOSPITAL_BASED_OUTPATIENT_CLINIC_OR_DEPARTMENT_OTHER): Payer: Self-pay

## 2022-07-10 ENCOUNTER — Encounter (HOSPITAL_BASED_OUTPATIENT_CLINIC_OR_DEPARTMENT_OTHER): Payer: Self-pay

## 2022-07-10 ENCOUNTER — Emergency Department (HOSPITAL_BASED_OUTPATIENT_CLINIC_OR_DEPARTMENT_OTHER)
Admission: EM | Admit: 2022-07-10 | Discharge: 2022-07-10 | Disposition: A | Discharge status: Home / Self Care | Payer: Medicaid Other | Attending: Emergency Medicine | Admitting: Emergency Medicine

## 2022-07-10 ENCOUNTER — Other Ambulatory Visit: Payer: Self-pay

## 2022-07-10 DIAGNOSIS — Z79899 Other long term (current) drug therapy: Secondary | ICD-10-CM | POA: Diagnosis not present

## 2022-07-10 DIAGNOSIS — H5310 Unspecified subjective visual disturbances: Secondary | ICD-10-CM | POA: Diagnosis present

## 2022-07-10 DIAGNOSIS — H538 Other visual disturbances: Secondary | ICD-10-CM

## 2022-07-10 DIAGNOSIS — H409 Unspecified glaucoma: Secondary | ICD-10-CM | POA: Diagnosis not present

## 2022-07-10 DIAGNOSIS — I110 Hypertensive heart disease with heart failure: Secondary | ICD-10-CM | POA: Diagnosis not present

## 2022-07-10 DIAGNOSIS — I5040 Unspecified combined systolic (congestive) and diastolic (congestive) heart failure: Secondary | ICD-10-CM | POA: Insufficient documentation

## 2022-07-10 DIAGNOSIS — Z7982 Long term (current) use of aspirin: Secondary | ICD-10-CM | POA: Insufficient documentation

## 2022-07-10 DIAGNOSIS — Z8546 Personal history of malignant neoplasm of prostate: Secondary | ICD-10-CM | POA: Insufficient documentation

## 2022-07-10 DIAGNOSIS — R519 Headache, unspecified: Secondary | ICD-10-CM | POA: Insufficient documentation

## 2022-07-10 LAB — CBC WITH DIFFERENTIAL/PLATELET
Abs Immature Granulocytes: 0.02 10*3/uL (ref 0.00–0.07)
Basophils Absolute: 0 10*3/uL (ref 0.0–0.1)
Basophils Relative: 1 %
Eosinophils Absolute: 0 10*3/uL (ref 0.0–0.5)
Eosinophils Relative: 1 %
HCT: 46.5 % (ref 39.0–52.0)
Hemoglobin: 15.4 g/dL (ref 13.0–17.0)
Immature Granulocytes: 1 %
Lymphocytes Relative: 24 %
Lymphs Abs: 1 10*3/uL (ref 0.7–4.0)
MCH: 27.5 pg (ref 26.0–34.0)
MCHC: 33.1 g/dL (ref 30.0–36.0)
MCV: 83 fL (ref 80.0–100.0)
Monocytes Absolute: 0.6 10*3/uL (ref 0.1–1.0)
Monocytes Relative: 14 %
Neutro Abs: 2.5 10*3/uL (ref 1.7–7.7)
Neutrophils Relative %: 59 %
Platelets: 183 10*3/uL (ref 150–400)
RBC: 5.6 MIL/uL (ref 4.22–5.81)
RDW: 14.6 % (ref 11.5–15.5)
WBC: 4.1 10*3/uL (ref 4.0–10.5)
nRBC: 0 % (ref 0.0–0.2)

## 2022-07-10 LAB — COMPREHENSIVE METABOLIC PANEL
ALT: 23 U/L (ref 0–44)
AST: 20 U/L (ref 15–41)
Albumin: 4.4 g/dL (ref 3.5–5.0)
Alkaline Phosphatase: 28 U/L — ABNORMAL LOW (ref 38–126)
Anion gap: 8 (ref 5–15)
BUN: 11 mg/dL (ref 8–23)
CO2: 26 mmol/L (ref 22–32)
Calcium: 9.3 mg/dL (ref 8.9–10.3)
Chloride: 104 mmol/L (ref 98–111)
Creatinine, Ser: 0.89 mg/dL (ref 0.61–1.24)
GFR, Estimated: >60 mL/min (ref >60)
Glucose, Bld: 90 mg/dL (ref 70–99)
Potassium: 3.6 mmol/L (ref 3.5–5.1)
Sodium: 138 mmol/L (ref 135–145)
Total Bilirubin: 1.3 mg/dL — ABNORMAL HIGH (ref 0.3–1.2)
Total Protein: 7 g/dL (ref 6.5–8.1)

## 2022-07-10 MED ORDER — ACETAZOLAMIDE 250 MG PO TABS
500.0000 mg | ORAL_TABLET | Freq: Once | ORAL | Status: AC
  Administered 2022-07-10: 500 mg via ORAL
  Filled 2022-07-10: qty 2

## 2022-07-10 MED ORDER — BRIMONIDINE TARTRATE 0.2 % OP SOLN: 1.0000 [drp] | Freq: Three times a day (TID) | OPHTHALMIC | 12 refills | Status: AC

## 2022-07-10 MED ORDER — DORZOLAMIDE HCL-TIMOLOL MAL 2-0.5 % OP SOLN: 1.0000 [drp] | Freq: Two times a day (BID) | OPHTHALMIC | 12 refills | Status: AC

## 2022-07-10 MED ORDER — TETRACAINE HCL 0.5 % OP SOLN
2.0000 [drp] | Freq: Once | OPHTHALMIC | Status: AC
  Administered 2022-07-10: 2 [drp] via OPHTHALMIC
  Filled 2022-07-10: qty 4

## 2022-07-10 MED ORDER — BRIMONIDINE TARTRATE 0.15 % OP SOLN
1.0000 [drp] | Freq: Once | OPHTHALMIC | Status: AC
  Administered 2022-07-10: 1 [drp] via OPHTHALMIC
  Filled 2022-07-10: qty 5

## 2022-07-10 MED ORDER — ACETAZOLAMIDE 250 MG PO TABS
500.0000 mg | ORAL_TABLET | Freq: Two times a day (BID) | ORAL | 0 refills | Status: AC
  Filled 2022-07-10: qty 28, 7d supply, fill #0

## 2022-07-10 MED ORDER — FLUORESCEIN SODIUM 1 MG OP STRP
1.0000 | ORAL_STRIP | Freq: Once | OPHTHALMIC | Status: AC
  Administered 2022-07-10: 1 via OPHTHALMIC
  Filled 2022-07-10 (×3): qty 1

## 2022-07-10 MED ORDER — DORZOLAMIDE HCL-TIMOLOL MAL 2-0.5 % OP SOLN
1.0000 [drp] | Freq: Two times a day (BID) | OPHTHALMIC | Status: DC
  Administered 2022-07-10: 1 [drp] via OPHTHALMIC
  Filled 2022-07-10: qty 10

## 2022-07-10 MED ORDER — TETRACAINE HCL 0.5 % OP SOLN
1.0000 [drp] | Freq: Once | OPHTHALMIC | Status: AC
  Administered 2022-07-10: 2 [drp] via OPHTHALMIC
  Filled 2022-07-10: qty 4

## 2022-07-10 MED ORDER — ACETAZOLAMIDE SODIUM 500 MG IJ SOLR: 500.0000 mg | Freq: Once | INTRAMUSCULAR | Status: DC

## 2022-07-10 NOTE — ED Notes (Signed)
Pt sent ED to ED to see ophthalmologist. Dr. Jodi Mourning admitting. Report called to [facility]. Pt sent with no IV. Pt daughter coming to get patient. VSS.

## 2022-07-10 NOTE — Discharge Instructions (Addendum)
The eye doctor, Dr. Essie Hart wants to see you in clinic tomorrow. He will call you tomorrow to schedule this. Please call his office at [phone] if you do not hear from him.   I have prescribed you a medication called Diamox.  Please take 500 mg twice daily.  Please continue to use the medications that we have given you in the emergency department, brimonidine and Cosopt.  You can use the brimonidine 1 drop 3 times daily and the Cosopt 1 drop 2 times daily.   It is very important to follow-up with the eye doctor, Dr. Essie Hart.  If you do not get treatment for this condition you can go blind.  Please return if you develop any new or worsening symptoms such as loss of vision, worsening pain or any other new process.

## 2022-07-10 NOTE — ED Provider Notes (Signed)
Physical Exam  BP (!) 168/102   Pulse (!) 59   Temp 98.3 F (36.8 C) (Oral)   Resp 16   Ht 5\' 9"  (1.753 m)   Wt 81.2 kg   SpO2 95%   BMI 26.43 kg/m   Physical Exam Vitals and nursing note reviewed.  Constitutional:      General: He is not in acute distress.    Appearance: Normal appearance.  HENT:     Head: Normocephalic and atraumatic.     Mouth/Throat:     Mouth: Mucous membranes are moist.  Eyes:     General:        Right eye: No discharge.        Left eye: No discharge.     Conjunctiva/sclera: Conjunctivae normal.     Pupils: Pupils are equal, round, and reactive to light.     Comments: Left conjunctival injection. See clinical course for IOP  Cardiovascular:     Rate and Rhythm: Normal rate.  Pulmonary:     Effort: Pulmonary effort is normal. No respiratory distress.  Abdominal:     General: Abdomen is flat.  Skin:    General: Skin is warm and dry.     Capillary Refill: Capillary refill takes less than 2 seconds.  Neurological:     General: No focal deficit present.     Mental Status: He is alert. Mental status is at baseline.  Psychiatric:        Mood and Affect: Mood normal.        Behavior: Behavior normal.     Procedures  .Critical Care  Performed by: Lonell Grandchild, MD Authorized by: Lonell Grandchild, MD   Critical care provider statement:    Critical care time (minutes):  30   Critical care was necessary to treat or prevent imminent or life-threatening deterioration of the following conditions: glaucoma.   Critical care was time spent personally by me on the following activities:  Development of treatment plan with patient or surrogate, discussions with consultants, evaluation of patient's response to treatment, examination of patient, ordering and review of laboratory studies, ordering and review of radiographic studies, ordering and performing treatments and interventions, pulse oximetry, re-evaluation of patient's condition and review of  old charts   ED Course / MDM   Clinical Course as of 07/10/22 2248  Mon Jul 10, 2022  2004 Patient sent from [facility] emergency department for further ophthalmologic treatment and evaluation.  Patient reports his symptoms actually feels slightly better currently.  I rechecked his intraocular pressure, 45 on the left eye and 27 in the right eye.  I discussed with Dr. Essie Hart.  On my exam patient actually does have a reactive pupil.  He has also been having symptoms for 3 months.  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  Patient does have reactive pupil so lower concern for a acute angle-closure process. [WS]  2248 Repeat pressure after medication now with pressure 20 in right eye and 26 in the left eye.  Patient reports that he feels much better.  Discussed again with on-call ophthalmologist Dr. Luci Bank who will see the patient in clinic tomorrow.  Discussed with the patient.  Patient will be sent home with Cosopt and brimonidine bottles that we administered in the emergency department as well as with instructions on how to use these.  Also prescribed Diamox per Dr.  [employer] recommendation.  Patient has normal kidney function. Will discharge patient to home. All questions answered. Patient comfortable with plan of discharge. Return precautions discussed with patient and specified on the after visit summary.  [WS]    Clinical Course User Index [WS] Lonell Grandchild, MD   Medical Decision Making Amount and/or Complexity of Data Reviewed Labs: ordered.  Risk Prescription drug management.     ICD-10-CM   1. Glaucoma of left eye, unspecified glaucoma type  H40.9     2. Blurred vision  H53.8     3. Acute nonintractable headache, unspecified headache type  R51.9             Lonell Grandchild, MD 07/10/22 2250

## 2022-07-10 NOTE — ED Provider Notes (Signed)
[place] [facility] AT [facility] Provider Note   CSN: [id number] Arrival date & time: 07/10/22  1256     History  Chief Complaint  Patient presents with   Eye Problem    Raymond [name] is a 62 y.o. male.  HPI     61yo male with history of hypertension, combined systolic and diastolic congestive heart failure, gastric ulcer with hemorrhage and H. pylori, prostate cancer, tobacco abuse, cocaine abuse, depression, dyslipidemia, seizure, NSTEMI June 2021 with out-of-hospital cardiac arrest requiring CPR went to cath lab and had DES placed, who presents with concern for blurred vision of left eye.  Left eye :"fogginess" Episodes of this for months Sometimes lasts a few hours then clears up Some pain headache, not excruciating but does feel dull headache No double vision, not visual field, just blurry in left eye, blurry all over, sees halos around lights Has been blurred since this AM Tried eye drops  Both eyes watering Sometimes can feel like there is something in it Didn't scratch it Feels like air blowing at eye and irritating it   Goes to [facility] for eye doctor Dr. Dierdre Searles Wears glasses, not contacts No known hx of glaucoma  Did take bp medications today No cp/dyspnea/Denies numbness, weakness, difficulty talking or walking, visual changes or facial droop.   No congestion or sore throat    Past Medical History:  Diagnosis Date   Acute gastric ulcer with hemorrhage 06/28/2021   Acute respiratory failure (HCC)    AKI (acute kidney injury) (HCC)    Arthritis    At risk for sleep apnea    STOP-BANG= 4    SENT TO PCP 07-09-2014   BPH (benign prostatic hypertrophy)    Cardiac arrest (HCC) 06/14/2019   Dental caries    periodontitis   Depression    Elevated PSA    Helicobacter pylori gastritis 06/28/2021   History of concussion    yrs ago-- no LOC-- no residual   History of depression    Hypertension    Nocturia    Prostate cancer  (HCC)    Wears glasses      Home Medications Prior to Admission medications   Medication Sig Start Date End Date Taking? Authorizing Provider  acetaminophen (TYLENOL) 500 MG tablet Take 500 mg by mouth daily as needed for headache or mild pain.    [provider]  aspirin 81 MG chewable tablet Chew 1 tablet (81 mg total) by mouth daily. 06/28/21   Leroy Sea, MD  atorvastatin (LIPITOR) 80 MG tablet Take 1 tablet (80 mg total) by mouth daily. 06/21/19   Almon Hercules, MD  EYSUVIS 0.25 % SUSP Apply 1 drop to eye 4 (four) times daily. 12/09/21   [provider]  fenofibrate (TRICOR) 145 MG tablet Take 1 tablet (145 mg total) by mouth daily. 07/02/21 07/02/22  Rhetta Mura, MD  ferrous sulfate 325 (65 FE) MG EC tablet Take 1 tablet (325 mg total) by mouth daily with breakfast. 08/24/21   Briant Cedar, PA-C  gemfibrozil (LOPID) 600 MG tablet Take 600 mg by mouth 2 (two) times daily. 08/31/21   [provider]  ibuprofen (ADVIL) 800 MG tablet Take 800 mg by mouth every 6 (six) hours as needed. 08/09/21   [provider]  lisinopril (ZESTRIL) 20 MG tablet Take 20 mg by mouth daily. 07/17/21   [provider]  metoprolol tartrate (LOPRESSOR) 25 MG tablet Take 0.5 tablets (12.5 mg total)  by mouth 2 (two) times daily. 07/02/21   Rhetta Mura, MD  pantoprazole (PROTONIX) 40 MG tablet Take 1 tablet (40 mg total) by mouth 2 (two) times daily. 07/02/21 10/30/21  Rhetta Mura, MD      Allergies    Patient has no known allergies.    Review of Systems   Review of Systems  Physical Exam Updated Vital Signs BP (!) 189/101   Pulse 71   Temp (!) 97.4 F (36.3 C)   Resp 18   Ht 5\' 9"  (1.753 m)   Wt 81.2 kg   SpO2 99%   BMI 26.43 kg/m  Physical Exam Vitals and nursing note reviewed.  Constitutional:      General: He is not in acute distress.    Appearance: Normal appearance. He is not ill-appearing, toxic-appearing or diaphoretic.   HENT:     Head: Normocephalic and atraumatic.     Comments: Pupils 3mm, not changing much with dark and minimal response to light Eyes:     General: No visual field deficit.    Extraocular Movements: Extraocular movements intact.     Pupils: Pupils are equal, round, and reactive to light.     Comments: Right eye IOP 23 Left eye IOP Or (error code for greater than 56 on 3 different examinations)  Mild conjunctival injection Pterygium right eye medial  Cardiovascular:     Rate and Rhythm: Normal rate and regular rhythm.     Pulses: Normal pulses.  Pulmonary:     Effort: Pulmonary effort is normal. No respiratory distress.  Musculoskeletal:        General: No swelling, tenderness, deformity or signs of injury.     Cervical back: Normal range of motion. No rigidity.  Skin:    General: Skin is warm and dry.     Coloration: Skin is not jaundiced or pale.     Findings: No erythema or rash.  Neurological:     General: No focal deficit present.     Mental Status: He is alert and oriented to person, place, and time.     GCS: GCS eye subscore is 4. GCS verbal subscore is 5. GCS motor subscore is 6.     Cranial Nerves: No cranial nerve deficit, dysarthria or facial asymmetry.     Sensory: No sensory deficit.     Motor: No weakness or tremor.     Coordination: Coordination normal. Finger-Nose-Finger Test normal.     Gait: Gait normal.         Visual Acuity  Right Eye Distance: 6 feet Left Eye Distance: 6 feet Bilateral Distance:    Right Eye Near: R Near: 20/20 Left Eye Near:  L Near: 20/40-20/50 Bilateral Near:      ED Results / Procedures / Treatments   Labs (all labs ordered are listed, but only abnormal results are displayed) Labs Reviewed  CBC WITH DIFFERENTIAL/PLATELET  COMPREHENSIVE METABOLIC PANEL    EKG None  Radiology No results found.  Procedures Procedures    Medications Ordered in ED Medications  fluorescein ophthalmic strip 1 strip (1 strip Both  Eyes Not Given 07/10/22 1548)  tetracaine (PONTOCAINE) 0.5 % ophthalmic solution 2 drop (2 drops Both Eyes Given 07/10/22 1500)    ED Course/ Medical Decision Making/ A&P                              62yo male with history of hypertension, combined systolic and diastolic congestive  heart failure, gastric ulcer with hemorrhage and H. pylori, prostate cancer, tobacco abuse, cocaine abuse, depression, dyslipidemia, seizure, NSTEMI June 2021 with out-of-hospital cardiac arrest requiring CPR went to cath lab and had DES placed, who presents with concern for blurred vision of left eye.  Unilateral vision changes without numbness/weakness/change in coordination, facial droop, difficulty talking or walking, normal visual fields, normal EOM and have lower suspicion for intracranial etiology of blurred vision in left eye at this time.  No other symptoms to suggest hypertensive emergency.  IOP evaluated and 23 in right eye, unmeasurable/high above 56 in left.  Discussed with Dr. Essie Hart Ophthalmology.  Recommends:  Diamox  500mg  BID Cosopt  1 drop BID Brimonidine %.2  1 drop  Will transfer to [facility] as we do not have these medications at [facility], and Dr. Essie Hart will come to bedside to evaluate.   I have ordered labs CBC/CMP given likely plan to start diamox and this has not resulted prior to transfer.        Final Clinical Impression(s) / ED Diagnoses Final diagnoses:  Glaucoma of left eye, unspecified glaucoma type  Blurred vision  Acute nonintractable headache, unspecified headache type    Rx / DC Orders ED Discharge Orders     None         Alvira Monday, MD 07/10/22 1616

## 2022-07-10 NOTE — ED Triage Notes (Signed)
Patient here POV from Home.  Endorses Fogginess/Blurriness to left Eye that began a few months. Has been becoming more frequent especially since. Sometimes associated with Headaches.   No numbness or Tingling.   NAD Noted during triage. A&Ox4. GCS 15. Ambulatory.

## 2022-07-11 ENCOUNTER — Other Ambulatory Visit (HOSPITAL_BASED_OUTPATIENT_CLINIC_OR_DEPARTMENT_OTHER): Payer: Self-pay

## 2022-07-11 ENCOUNTER — Other Ambulatory Visit: Payer: Self-pay

## 2022-07-25 ENCOUNTER — Other Ambulatory Visit (HOSPITAL_BASED_OUTPATIENT_CLINIC_OR_DEPARTMENT_OTHER): Payer: Self-pay

## 2022-07-26 ENCOUNTER — Inpatient Hospital Stay (HOSPITAL_BASED_OUTPATIENT_CLINIC_OR_DEPARTMENT_OTHER): Payer: Medicaid Other | Admitting: Physician Assistant

## 2022-07-26 ENCOUNTER — Other Ambulatory Visit: Payer: Self-pay | Admitting: Physician Assistant

## 2022-07-26 ENCOUNTER — Inpatient Hospital Stay: Payer: Medicaid Other | Attending: Physician Assistant

## 2022-07-26 ENCOUNTER — Other Ambulatory Visit: Payer: Self-pay

## 2022-07-26 VITALS — BP 163/97 | HR 68 | Temp 98.1°F | Resp 16 | Wt 177.3 lb

## 2022-07-26 DIAGNOSIS — F17210 Nicotine dependence, cigarettes, uncomplicated: Secondary | ICD-10-CM | POA: Insufficient documentation

## 2022-07-26 DIAGNOSIS — D500 Iron deficiency anemia secondary to blood loss (chronic): Secondary | ICD-10-CM | POA: Insufficient documentation

## 2022-07-26 DIAGNOSIS — K259 Gastric ulcer, unspecified as acute or chronic, without hemorrhage or perforation: Secondary | ICD-10-CM | POA: Diagnosis not present

## 2022-07-26 LAB — CBC WITH DIFFERENTIAL (CANCER CENTER ONLY)
Abs Immature Granulocytes: 0.04 10*3/uL (ref 0.00–0.07)
Basophils Absolute: 0.1 10*3/uL (ref 0.0–0.1)
Basophils Relative: 1 %
Eosinophils Absolute: 0.1 10*3/uL (ref 0.0–0.5)
Eosinophils Relative: 3 %
HCT: 44.7 % (ref 39.0–52.0)
Hemoglobin: 15 g/dL (ref 13.0–17.0)
Immature Granulocytes: 1 %
Lymphocytes Relative: 23 %
Lymphs Abs: 1 10*3/uL (ref 0.7–4.0)
MCH: 28.2 pg (ref 26.0–34.0)
MCHC: 33.6 g/dL (ref 30.0–36.0)
MCV: 84.2 fL (ref 80.0–100.0)
Monocytes Absolute: 0.7 10*3/uL (ref 0.1–1.0)
Monocytes Relative: 15 %
Neutro Abs: 2.5 10*3/uL (ref 1.7–7.7)
Neutrophils Relative %: 57 %
Platelet Count: 182 10*3/uL (ref 150–400)
RBC: 5.31 MIL/uL (ref 4.22–5.81)
RDW: 15.1 % (ref 11.5–15.5)
WBC Count: 4.4 10*3/uL (ref 4.0–10.5)
nRBC: 0 % (ref 0.0–0.2)

## 2022-07-26 LAB — IRON AND IRON BINDING CAPACITY (CC-WL,HP ONLY)
Iron: 39 ug/dL — ABNORMAL LOW (ref 45–182)
Saturation Ratios: 12 % — ABNORMAL LOW (ref 17.9–39.5)
TIBC: 340 ug/dL (ref 250–450)
UIBC: 301 ug/dL (ref 117–376)

## 2022-07-26 LAB — FERRITIN: Ferritin: 62 ng/mL (ref 24–336)

## 2022-07-27 ENCOUNTER — Telehealth: Payer: Self-pay | Admitting: Physician Assistant

## 2022-07-27 MED ORDER — FERROUS SULFATE 325 (65 FE) MG PO TBEC: 325.0000 mg | DELAYED_RELEASE_TABLET | Freq: Every day | ORAL | 3 refills | Status: AC

## 2022-07-27 NOTE — Telephone Encounter (Signed)
Patient is aware of upcoming appointment times/dates.  

## 2022-07-27 NOTE — Progress Notes (Signed)
[facility] Telephone:([phone]   [phone]  PROGRESS NOTE  Patient Care Team: Bonsu, Raymond Number, DO as PCP - General (Internal Medicine) Lennette Bihari, MD as PCP - Cardiology (Cardiology) Bonsu, Raymond Number, DO (Internal Medicine)  Hematological/Oncological History 1)  06/22/2021-06/26/2021: Admitted for hematemesis and found to have nonbleeding gastric ulcer with adherent clot on EGD, Received 2 units of PRBC.   2)  06/29/2021-07/02/2021 patient admitted for acute GI bleed secondary to gastric ulcer from H. pylori.  06/29/2021 EGD with normal esophagus, large amount of food residue and nonbleeding gastric ulcer with adherent clot which was injected and clipped.  He received 1 units of PRBC  3) 07/27/2021: Labs show Hgb 9.6, MCV 75.8, Iron 25, saturation 4.6%, ferritin 7.1, TIBC 543.2.   4) 08/24/2021: Establish care with [facility] Hematology  5) 10/04/2021: Received IV monoferric 1000 mg x 1 dose  CHIEF COMPLAINTS/PURPOSE OF CONSULTATION:  Iron deficiency anemia 2/2 GI bleed  HISTORY OF PRESENTING ILLNESS:  Raymond [name] 62 y.o. male returns for a follow up for iron deficiency anemia. He was last seen on 12/21/2021 to establish care. In the interim, he denies any changes to his health. He is unaccompanied for this visit.  On exam today, [name] reports that his energy is unchanged and he is able to complete his ADLs on his own. He reports blurry vision in his left eye secondary to glaucoma. He denies any bruising or signs of active bleeding including hematochezia or melena.  He denies fevers, chills, night sweats, shortness of breath, chest pain, cough, nausea, vomiting, diarrhea or constipation.  He has no other complaints.  Rest of the 10 point ROS is below.  MEDICAL HISTORY:  Past Medical History:  Diagnosis Date   Acute gastric ulcer with hemorrhage 06/28/2021   Acute respiratory failure (HCC)    AKI (acute kidney injury) (HCC)    Arthritis    At risk for sleep  apnea    STOP-BANG= 4    SENT TO PCP 07-09-2014   BPH (benign prostatic hypertrophy)    Cardiac arrest (HCC) 06/14/2019   Dental caries    periodontitis   Depression    Elevated PSA    Helicobacter pylori gastritis 06/28/2021   History of concussion    yrs ago-- no LOC-- no residual   History of depression    Hypertension    Nocturia    Prostate cancer (HCC)    Wears glasses     SURGICAL HISTORY: Past Surgical History:  Procedure Laterality Date   APPENDECTOMY  age 51   BIOPSY  06/23/2021   Procedure: BIOPSY;  Surgeon: Iva Boop, MD;  Location: [facility] ENDOSCOPY;  Service: Gastroenterology;;   CORONARY STENT INTERVENTION N/A 06/14/2019   Procedure: CORONARY STENT INTERVENTION;  Surgeon: Lennette Bihari, MD;  Location: [facility] INVASIVE CV LAB;  Service: Cardiovascular;  Laterality: N/A;   CORONARY/GRAFT ACUTE MI REVASCULARIZATION N/A 06/14/2019   Procedure: Coronary/Graft Acute MI Revascularization;  Surgeon: Lennette Bihari, MD;  Location: [facility] INVASIVE CV LAB;  Service: Cardiovascular;  Laterality: N/A;   CYSTOSCOPY WITH URETHRAL DILATATION N/A 06/15/2019   Procedure: CYSTOSCOPY WITH BALLOON URETHRAL DILATATION;  Surgeon: Noel Christmas, MD;  Location: [facility] OR;  Service: Urology;  Laterality: N/A;   ESOPHAGOGASTRODUODENOSCOPY (EGD) WITH PROPOFOL N/A 06/23/2021   Procedure: ESOPHAGOGASTRODUODENOSCOPY (EGD) WITH PROPOFOL;  Surgeon: Iva Boop, MD;  Location: [facility] ENDOSCOPY;  Service: Gastroenterology;  Laterality: N/A;   ESOPHAGOGASTRODUODENOSCOPY (EGD) WITH PROPOFOL N/A 06/29/2021  Procedure: ESOPHAGOGASTRODUODENOSCOPY (EGD) WITH PROPOFOL;  Surgeon: Imogene Burn, MD;  Location: [facility] ENDOSCOPY;  Service: Gastroenterology;  Laterality: N/A;   HEMOSTASIS CLIP PLACEMENT  06/29/2021   Procedure: HEMOSTASIS CLIP PLACEMENT;  Surgeon: Imogene Burn, MD;  Location: [facility] ENDOSCOPY;  Service: Gastroenterology;;   LEFT HEART CATH AND CORONARY ANGIOGRAPHY N/A 06/14/2019   Procedure: LEFT HEART  CATH AND CORONARY ANGIOGRAPHY;  Surgeon: Lennette Bihari, MD;  Location: [facility] INVASIVE CV LAB;  Service: Cardiovascular;  Laterality: N/A;   MULTIPLE EXTRACTIONS WITH ALVEOLOPLASTY N/A 10/12/2017   Procedure: MULTIPLE EXTRACTION WITH ALVEOLOPLASTY;  Surgeon: Ocie Doyne, DDS;  Location: [facility] OR;  Service: Oral Surgery;  Laterality: N/A;   PELVIC LYMPH NODE DISSECTION Bilateral 11/04/2018   Procedure: PELVIC LYMPH NODE DISSECTION;  Surgeon: Crista Elliot, MD;  Location: [facility] ORS;  Service: Urology;  Laterality: Bilateral;   PROSTATE BIOPSY N/A 07/15/2014   Procedure: SATURATION BIOPSY TRANSRECTAL ULTRASONIC PROSTATE (TUBP);  Surgeon: Barron Alvine, MD;  Location: [facility];  Service: Urology;  Laterality: N/A;   PROSTATE BIOPSY N/A 08/07/2018   Procedure: BIOPSY TRANSRECTAL ULTRASONIC PROSTATE (TUBP);  Surgeon: Rene Paci, MD;  Location: [facility];  Service: Urology;  Laterality: N/A;  ONLY NEEDS 30 MIN   RIGHT KNEE ARTHROSCOPY /  MENISECTOMY/  DEBRIDEMENT OF CHONDROMALACIA PATELLA AND MEDIAL SHELF PLICA/  SUBCHONDROPLASTY  06/10/2010   ROBOT ASSISTED LAPAROSCOPIC RADICAL PROSTATECTOMY N/A 11/04/2018   Procedure: XI ROBOTIC ASSISTED LAPAROSCOPIC RADICAL PROSTATECTOMY;  Surgeon: Crista Elliot, MD;  Location: [facility] ORS;  Service: Urology;  Laterality: N/A;   SCHLEROTHERAPY  06/29/2021   Procedure: SCHLEROTHERAPy;  Surgeon: Imogene Burn, MD;  Location: [facility] ENDOSCOPY;  Service: Gastroenterology;;   TOTAL KNEE ARTHROPLASTY  05/31/2011   Procedure: TOTAL KNEE ARTHROPLASTY;  Surgeon: Loreta Ave, MD;  Location: [facility] OR;  Service: Orthopedics;  Laterality: Right;  with revision stem   UPPER GASTROINTESTINAL ENDOSCOPY      SOCIAL HISTORY: Social History   Socioeconomic History   Marital status: Single    Spouse name: Not on file   [name] of children: 4   Years of education: Not on file   Highest education level: Not on file  Occupational History     Comment: disability  Tobacco Use   Smoking status: Every Day    Current packs/day: 0.50    Average packs/day: 0.5 packs/day for 4.0 years (2.0 ttl pk-yrs)    Types: Cigarettes   Smokeless tobacco: Never  Vaping Use   Vaping status: Never Used  Substance and Sexual Activity   Alcohol use: Yes    Comment: Occ   Drug use: Never    Comment: hx marijuana use- none recently states on 10/30/2018   Sexual activity: Yes  Other Topics Concern   Not on file  Social History Narrative   ** Merged History Encounter **       His daughter is a Engineer, civil (consulting) at [employer], who works night shift.   Social Determinants of Health   Financial Resource Strain: Not on file  Food Insecurity: Not on file  Transportation Needs: Not on file  Physical Activity: Not on file  Stress: Not on file  Social Connections: Not on file  Intimate Partner Violence: Not on file    FAMILY HISTORY: Family History  Problem Relation Age of Onset   Leukemia Brother    Prostate cancer Neg Hx    Colon cancer Neg Hx    Breast cancer Neg  Hx    Esophageal cancer Neg Hx    Rectal cancer Neg Hx    Stomach cancer Neg Hx     ALLERGIES:  has No Known Allergies.  MEDICATIONS:  Current Outpatient Medications  Medication Sig Dispense Refill   acetaminophen (TYLENOL) 500 MG tablet Take 500 mg by mouth daily as needed for headache or mild pain.     aspirin 81 MG chewable tablet Chew 1 tablet (81 mg total) by mouth daily. 90 tablet 1   atorvastatin (LIPITOR) 80 MG tablet Take 1 tablet (80 mg total) by mouth daily. 90 tablet 1   brimonidine (ALPHAGAN) 0.2 % ophthalmic solution Place 1 drop into the left eye 3 (three) times daily. 5 mL 12   dorzolamide-timolol (COSOPT) 2-0.5 % ophthalmic solution Place 1 drop into the left eye 2 (two) times daily. 10 mL 12   EYSUVIS 0.25 % SUSP Apply 1 drop to eye 4 (four) times daily.     ferrous sulfate 325 (65 FE) MG EC tablet Take 1 tablet (325 mg total) by mouth daily with  breakfast. 30 tablet 3   gemfibrozil (LOPID) 600 MG tablet Take 600 mg by mouth 2 (two) times daily.     ibuprofen (ADVIL) 800 MG tablet Take 800 mg by mouth every 6 (six) hours as needed.     lisinopril (ZESTRIL) 20 MG tablet Take 20 mg by mouth daily.     metoprolol tartrate (LOPRESSOR) 25 MG tablet Take 0.5 tablets (12.5 mg total) by mouth 2 (two) times daily.     acetaZOLAMIDE (DIAMOX) 250 MG tablet Take 2 tablets (500 mg total) by mouth 2 (two) times daily for 7 days. 28 tablet 0   fenofibrate (TRICOR) 145 MG tablet Take 1 tablet (145 mg total) by mouth daily. 30 tablet 11   pantoprazole (PROTONIX) 40 MG tablet Take 1 tablet (40 mg total) by mouth 2 (two) times daily. 120 tablet 1   No current facility-administered medications for this visit.   Facility-Administered Medications Ordered in Other Visits  Medication Dose Route Frequency Provider Last Rate Last Admin   sodium phosphate (FLEET) 7-19 GM/118ML enema 1 enema  1 enema Rectal Once Winter, Dorian Furnace, MD        REVIEW OF SYSTEMS:   Constitutional: ( - ) fevers, ( - )  chills , ( - ) night sweats Eyes: ( - ) blurriness of vision, ( - ) double vision, ( - ) watery eyes Ears, nose, mouth, throat, and face: ( - ) mucositis, ( - ) sore throat Respiratory: ( - ) cough, ( - ) dyspnea, ( - ) wheezes Cardiovascular: ( - ) palpitation, ( - ) chest discomfort, ( - ) lower extremity swelling Gastrointestinal:  ( - ) nausea, ( - ) heartburn, ( - ) change in bowel habits Skin: ( - ) abnormal skin rashes Lymphatics: ( - ) new lymphadenopathy, ( - ) easy bruising Neurological: ( - ) numbness, ( - ) tingling, ( - ) new weaknesses Behavioral/Psych: ( - ) mood change, ( - ) new changes  All other systems were reviewed with the patient and are negative.  PHYSICAL EXAMINATION: ECOG PERFORMANCE STATUS: 0 - Asymptomatic  Vitals:   07/26/22 1114 07/26/22 1119  BP: (!) 179/93 (!) 163/97  Pulse: 68   Resp: 16   Temp: 98.1 F (36.7 C)    SpO2: 95%    Filed Weights   07/26/22 1114  Weight: 177 lb 4.8 oz (80.4 kg)    GENERAL:  well appearing male in NAD  SKIN: skin color, texture, turgor are normal, no rashes or significant lesions EYES: conjunctiva are pink and non-injected, sclera clear LUNGS: clear to auscultation and percussion with normal breathing effort HEART: regular rate & rhythm and no murmurs and no lower extremity edema Musculoskeletal: no cyanosis of digits and no clubbing  PSYCH: alert & oriented x 3, fluent speech NEURO: no focal motor/sensory deficits  LABORATORY DATA:  I have reviewed the data as listed    Latest Ref Rng & Units 07/26/2022    9:51 AM 07/10/2022    4:24 PM 03/24/2022    8:29 AM  CBC  WBC 4.0 - 10.5 K/uL 4.4  4.1  5.5   Hemoglobin 13.0 - 17.0 g/dL 16.1  09.6  04.5   Hematocrit 39.0 - 52.0 % 44.7  46.5  44.9   Platelets 150 - 400 K/uL 182  183  185        Latest Ref Rng & Units 07/10/2022    4:24 PM 12/21/2021    9:38 AM 09/21/2021   10:16 AM  CMP  Glucose 70 - 99 mg/dL 90  93  409   BUN 8 - 23 mg/dL 11  13  9    Creatinine 0.61 - 1.24 mg/dL 8.11  9.14  7.82   Sodium 135 - 145 mmol/L 138  142  141   Potassium 3.5 - 5.1 mmol/L 3.6  4.3  4.0   Chloride 98 - 111 mmol/L 104  106  109   CO2 22 - 32 mmol/L 26  32  28   Calcium 8.9 - 10.3 mg/dL 9.3  95.6  9.7   Total Protein 6.5 - 8.1 g/dL 7.0  7.0  6.7   Total Bilirubin 0.3 - 1.2 mg/dL 1.3  0.3  0.6   Alkaline Phos 38 - 126 U/L 28  27  35   AST 15 - 41 U/L 20  14  19    ALT 0 - 44 U/L 23  16  21     ASSESSMENT & PLAN Raymond [name] is a 62 y.o. male returns for a follow up for iron deficiency anemia.   #Iron deficiency anemia: --etiology felt to be GI bleed 2/2 gastric ulcer from H.pylori.  --completed treatment for H.pylori infection and continues on protonix 40 mg twice daily.  --underwent repeat EGD on 09/22/2021 that showed one non-bleeding cratered gastric ulcer. Pathology negative for H.pylori, dysplasia or carcinoma.   --received IV monoferric 1000 mg x 1 dose on 09/22/2021.  --Labs today show that anemia has resolved with Hgb 15.0, MCV 84.2. Iron panel shows serum iron 39, TIBC 340, saturation 12%, ferritin 62.  --No need for additional IV iron at this time. Continue to take ferrous sulfate 325 mg once daily with a source of vitamin C --Continue to incorporate iron Raymond foods into diet.  --RTC in 3 months with lab only and 6 months with labs/follow up.   No orders of the defined types were placed in this encounter.   All questions were answered. The patient knows to call the clinic with any problems, questions or concerns.  I have spent a total of 25 minutes minutes of face-to-face and non-face-to-face time, preparing to see the patient,performing a medically appropriate examination, counseling and educating the patient, ordering medications/tests/procedures, documenting clinical information in the electronic health record, and care coordination.   Georga Kaufmann, PA-C Department of Hematology/Oncology [facility] at [facility] Phone: [phone]

## 2022-07-28 ENCOUNTER — Telehealth: Payer: Self-pay

## 2022-07-28 NOTE — Telephone Encounter (Signed)
LM for pt with lab results and recommendations. 

## 2022-07-28 NOTE — Telephone Encounter (Signed)
-----   Message from Briant Cedar sent at 07/27/2022 12:08 PM EDT ----- Please notify patient that iron levels are borderline low, recommend to continue on iron pills. No need for IV iron at this time. ----- Message ----- From: Leory Plowman, Lab In [place] Sent: 07/26/2022  10:43 AM EDT To: Briant Cedar, PA-C

## 2022-09-04 ENCOUNTER — Ambulatory Visit: Payer: Medicaid Other | Attending: Cardiovascular Disease | Admitting: Cardiovascular Disease

## 2022-09-05 ENCOUNTER — Encounter: Payer: Self-pay | Admitting: Cardiovascular Disease

## 2022-10-23 ENCOUNTER — Telehealth: Payer: Self-pay

## 2022-10-23 ENCOUNTER — Inpatient Hospital Stay: Payer: Medicaid Other | Attending: Physician Assistant

## 2022-10-23 DIAGNOSIS — D500 Iron deficiency anemia secondary to blood loss (chronic): Secondary | ICD-10-CM | POA: Diagnosis present

## 2022-10-23 DIAGNOSIS — K922 Gastrointestinal hemorrhage, unspecified: Secondary | ICD-10-CM | POA: Insufficient documentation

## 2022-10-23 LAB — CBC WITH DIFFERENTIAL (CANCER CENTER ONLY)
Abs Immature Granulocytes: 0.03 10*3/uL (ref 0.00–0.07)
Basophils Absolute: 0 10*3/uL (ref 0.0–0.1)
Basophils Relative: 0 %
Eosinophils Absolute: 0.1 10*3/uL (ref 0.0–0.5)
Eosinophils Relative: 2 %
HCT: 47.2 % (ref 39.0–52.0)
Hemoglobin: 15.3 g/dL (ref 13.0–17.0)
Immature Granulocytes: 1 %
Lymphocytes Relative: 18 %
Lymphs Abs: 0.9 10*3/uL (ref 0.7–4.0)
MCH: 28.1 pg (ref 26.0–34.0)
MCHC: 32.4 g/dL (ref 30.0–36.0)
MCV: 86.8 fL (ref 80.0–100.0)
Monocytes Absolute: 0.7 10*3/uL (ref 0.1–1.0)
Monocytes Relative: 15 %
Neutro Abs: 3.1 10*3/uL (ref 1.7–7.7)
Neutrophils Relative %: 64 %
Platelet Count: 184 10*3/uL (ref 150–400)
RBC: 5.44 MIL/uL (ref 4.22–5.81)
RDW: 15.3 % (ref 11.5–15.5)
WBC Count: 4.8 10*3/uL (ref 4.0–10.5)
nRBC: 0 % (ref 0.0–0.2)

## 2022-10-23 LAB — IRON AND IRON BINDING CAPACITY (CC-WL,HP ONLY)
Iron: 106 ug/dL (ref 45–182)
Saturation Ratios: 27 % (ref 17.9–39.5)
TIBC: 395 ug/dL (ref 250–450)
UIBC: 289 ug/dL (ref 117–376)

## 2022-10-23 LAB — FERRITIN: Ferritin: 75 ng/mL (ref 24–336)

## 2022-10-23 NOTE — Telephone Encounter (Signed)
LM for pt with lab results.   ?

## 2022-10-23 NOTE — Telephone Encounter (Signed)
-----   Message from Briant Cedar sent at 10/23/2022  2:21 PM EDT ----- Please notify patient that iron levels are normal. No need for IV iron at this time. ----- Message ----- From: Interface, Lab In [place] Sent: 10/23/2022   8:59 AM EDT To: Briant Cedar, PA-C

## 2023-01-21 NOTE — Progress Notes (Signed)
 No show

## 2023-01-22 ENCOUNTER — Inpatient Hospital Stay: Payer: Medicaid Other | Admitting: Hematology and Oncology

## 2023-01-22 ENCOUNTER — Inpatient Hospital Stay: Payer: Medicaid Other | Attending: Physician Assistant

## 2023-01-22 DIAGNOSIS — D508 Other iron deficiency anemias: Secondary | ICD-10-CM

## 2023-01-22 DIAGNOSIS — D500 Iron deficiency anemia secondary to blood loss (chronic): Secondary | ICD-10-CM

## 2023-03-07 ENCOUNTER — Other Ambulatory Visit (INDEPENDENT_AMBULATORY_CARE_PROVIDER_SITE_OTHER): Payer: Self-pay

## 2023-03-07 ENCOUNTER — Ambulatory Visit (INDEPENDENT_AMBULATORY_CARE_PROVIDER_SITE_OTHER): Payer: Medicaid Other | Admitting: Family

## 2023-03-07 ENCOUNTER — Encounter: Payer: Self-pay | Admitting: Family

## 2023-03-07 DIAGNOSIS — G8929 Other chronic pain: Secondary | ICD-10-CM

## 2023-03-07 DIAGNOSIS — M25562 Pain in left knee: Secondary | ICD-10-CM

## 2023-03-07 DIAGNOSIS — M1712 Unilateral primary osteoarthritis, left knee: Secondary | ICD-10-CM | POA: Diagnosis not present

## 2023-03-07 MED ORDER — NABUMETONE 500 MG PO TABS: 500.0000 mg | ORAL_TABLET | Freq: Every day | ORAL | 1 refills | Status: AC

## 2023-03-07 NOTE — Progress Notes (Signed)
 Office Visit Note   Patient: Raymond [name]           Date of Birth: 04/09/1960           MRN: [id number] Visit Date: 03/07/2023              Requested by: Temple Pacini, DO [address] [place],  [place] [address] PCP: Temple Pacini, DO  Chief Complaint  Patient presents with   Left Knee - Pain      HPI: The patient is a 63 year old gentleman who is seen today for initial evaluation of left knee pain.  He reports that he has had worsening pain since November of last year without any known injury pain is primarily medial and some behind the kneecap  Reports some giving way difficulty getting comfortable in bed at night.  The pain does not wake him from sleep.  He reports his knee feels similar to how his right knee felt prior to total knee arthroplasty on the right  Assessment & Plan: Visit Diagnoses:  1. Chronic pain of left knee     Plan: Discussed anti-inflammatories he has not yet tried any treatments he will begin with Relafen.  He deferred Depo-Medrol injection today  Follow-Up Instructions: Return in about 4 weeks (around 04/04/2023), or if symptoms worsen or fail to improve.   Left Knee Exam   Tenderness  The patient is experiencing tenderness in the medial joint line.  Range of Motion  The patient has normal left knee ROM.  Tests  Varus: negative Valgus: negative  Other  Effusion: no effusion present      Patient is alert, oriented, no adenopathy, well-dressed, normal affect, normal respiratory effort.  Imaging: No results found. No images are attached to the encounter.  Labs: No results found for: "HGBA1C", "ESRSEDRATE", "CRP", "LABURIC", "REPTSTATUS", "GRAMSTAIN", "CULT", "LABORGA"   Lab Results  Component Value Date   ALBUMIN 4.4 07/10/2022   ALBUMIN 4.4 12/21/2021   ALBUMIN 4.3 09/21/2021    Lab Results  Component Value Date   MG 2.0 06/23/2021   MG 2.3 06/20/2019   MG 2.5 (H) 06/19/2019   No results found for:  "VD25OH"  No results found for: "PREALBUMIN"    Latest Ref Rng & Units 10/23/2022    8:34 AM 07/26/2022    9:51 AM 07/10/2022    4:24 PM  CBC EXTENDED  WBC 4.0 - 10.5 K/uL 4.8  4.4  4.1   RBC 4.22 - 5.81 MIL/uL 5.44  5.31  5.60   Hemoglobin 13.0 - 17.0 g/dL 19.1  47.8  29.5   HCT 39.0 - 52.0 % 47.2  44.7  46.5   Platelets 150 - 400 K/uL 184  182  183   NEUT# 1.7 - 7.7 K/uL 3.1  2.5  2.5   Lymph# 0.7 - 4.0 K/uL 0.9  1.0  1.0      There is no height or weight on file to calculate BMI.  Orders:  Orders Placed This Encounter  Procedures   XR Knee 1-2 Views Left   Meds ordered this encounter  Medications   nabumetone (RELAFEN) 500 MG tablet    Sig: Take 1 tablet (500 mg total) by mouth daily.    Dispense:  60 tablet    Refill:  1     Procedures: No procedures performed  Clinical Data: No additional findings.  ROS:  All other systems negative, except as noted in the HPI. Review of Systems  Objective: Vital Signs: There were no vitals taken for this visit.  Specialty Comments:  No specialty comments available.  PMFS History: Patient Active Problem List   Diagnosis Date Noted   Iron deficiency anemia due to chronic blood loss 09/22/2021   Absolute anemia 08/24/2021   Acute upper GI bleeding 06/29/2021   Chronic combined systolic (congestive) and diastolic (congestive) heart failure (HCC) 06/29/2021   Dyslipidemia 06/29/2021   Acute gastric ulcer with hemorrhage 06/28/2021   Helicobacter pylori gastritis 06/28/2021   GI bleeding 06/23/2021   Hematemesis with nausea    Chronic gastric ulcer with hemorrhage    Cardiac arrest (HCC) 06/14/2019   Seizure (HCC)    Prostate cancer (HCC) 11/04/2018   Malignant neoplasm of prostate (HCC) 09/13/2018   EPIDERMOID CYST 09/07/2009   LYMPHADENOPATHY 09/07/2009   ABSCESS, TOOTH 05/21/2009   HYPERTRIGLYCERIDEMIA 04/19/2009   DEPRESSION 03/24/2009   COCAINE ABUSE 02/15/2009   Alcohol abuse 02/12/2009   TOBACCO ABUSE  02/12/2009   Essential hypertension 02/12/2009   OSTEOARTHRITIS 02/12/2009   BACK PAIN, LUMBAR 02/12/2009   URINALYSIS, ABNORMAL 02/12/2009   Past Medical History:  Diagnosis Date   Acute gastric ulcer with hemorrhage 06/28/2021   Acute respiratory failure (HCC)    AKI (acute kidney injury) (HCC)    Arthritis    At risk for sleep apnea    STOP-BANG= 4    SENT TO PCP 07-09-2014   BPH (benign prostatic hypertrophy)    Cardiac arrest (HCC) 06/14/2019   Dental caries    periodontitis   Depression    Elevated PSA    Helicobacter pylori gastritis 06/28/2021   History of concussion    yrs ago-- no LOC-- no residual   History of depression    Hypertension    Nocturia    Prostate cancer (HCC)    Wears glasses     Family History  Problem Relation Age of Onset   Leukemia Brother    Prostate cancer Neg Hx    Colon cancer Neg Hx    Breast cancer Neg Hx    Esophageal cancer Neg Hx    Rectal cancer Neg Hx    Stomach cancer Neg Hx     Past Surgical History:  Procedure Laterality Date   APPENDECTOMY  age 70   BIOPSY  06/23/2021   Procedure: BIOPSY;  Surgeon: Iva Boop, MD;  Location: [facility] ENDOSCOPY;  Service: Gastroenterology;;   CORONARY STENT INTERVENTION N/A 06/14/2019   Procedure: CORONARY STENT INTERVENTION;  Surgeon: Lennette Bihari, MD;  Location: [facility] INVASIVE CV LAB;  Service: Cardiovascular;  Laterality: N/A;   CORONARY/GRAFT ACUTE MI REVASCULARIZATION N/A 06/14/2019   Procedure: Coronary/Graft Acute MI Revascularization;  Surgeon: Lennette Bihari, MD;  Location: [facility] INVASIVE CV LAB;  Service: Cardiovascular;  Laterality: N/A;   CYSTOSCOPY WITH URETHRAL DILATATION N/A 06/15/2019   Procedure: CYSTOSCOPY WITH BALLOON URETHRAL DILATATION;  Surgeon: Noel Christmas, MD;  Location: [facility] OR;  Service: Urology;  Laterality: N/A;   ESOPHAGOGASTRODUODENOSCOPY (EGD) WITH PROPOFOL N/A 06/23/2021   Procedure: ESOPHAGOGASTRODUODENOSCOPY (EGD) WITH PROPOFOL;  Surgeon: Iva Boop,  MD;  Location: [facility] ENDOSCOPY;  Service: Gastroenterology;  Laterality: N/A;   ESOPHAGOGASTRODUODENOSCOPY (EGD) WITH PROPOFOL N/A 06/29/2021   Procedure: ESOPHAGOGASTRODUODENOSCOPY (EGD) WITH PROPOFOL;  Surgeon: Imogene Burn, MD;  Location: [facility] ENDOSCOPY;  Service: Gastroenterology;  Laterality: N/A;   HEMOSTASIS CLIP PLACEMENT  06/29/2021   Procedure: HEMOSTASIS CLIP PLACEMENT;  Surgeon: Imogene Burn, MD;  Location: [facility] ENDOSCOPY;  Service: Gastroenterology;;   LEFT  HEART CATH AND CORONARY ANGIOGRAPHY N/A 06/14/2019   Procedure: LEFT HEART CATH AND CORONARY ANGIOGRAPHY;  Surgeon: Lennette Bihari, MD;  Location: [facility] INVASIVE CV LAB;  Service: Cardiovascular;  Laterality: N/A;   MULTIPLE EXTRACTIONS WITH ALVEOLOPLASTY N/A 10/12/2017   Procedure: MULTIPLE EXTRACTION WITH ALVEOLOPLASTY;  Surgeon: Ocie Doyne, DDS;  Location: [facility] OR;  Service: Oral Surgery;  Laterality: N/A;   PELVIC LYMPH NODE DISSECTION Bilateral 11/04/2018   Procedure: PELVIC LYMPH NODE DISSECTION;  Surgeon: Crista Elliot, MD;  Location: [facility] ORS;  Service: Urology;  Laterality: Bilateral;   PROSTATE BIOPSY N/A 07/15/2014   Procedure: SATURATION BIOPSY TRANSRECTAL ULTRASONIC PROSTATE (TUBP);  Surgeon: Barron Alvine, MD;  Location: [facility];  Service: Urology;  Laterality: N/A;   PROSTATE BIOPSY N/A 08/07/2018   Procedure: BIOPSY TRANSRECTAL ULTRASONIC PROSTATE (TUBP);  Surgeon: Rene Paci, MD;  Location: [facility];  Service: Urology;  Laterality: N/A;  ONLY NEEDS 30 MIN   RIGHT KNEE ARTHROSCOPY /  MENISECTOMY/  DEBRIDEMENT OF CHONDROMALACIA PATELLA AND MEDIAL SHELF PLICA/  SUBCHONDROPLASTY  06/10/2010   ROBOT ASSISTED LAPAROSCOPIC RADICAL PROSTATECTOMY N/A 11/04/2018   Procedure: XI ROBOTIC ASSISTED LAPAROSCOPIC RADICAL PROSTATECTOMY;  Surgeon: Crista Elliot, MD;  Location: [facility] ORS;  Service: Urology;  Laterality: N/A;   SCHLEROTHERAPY  06/29/2021   Procedure: SCHLEROTHERAPy;   Surgeon: Imogene Burn, MD;  Location: [facility] ENDOSCOPY;  Service: Gastroenterology;;   TOTAL KNEE ARTHROPLASTY  05/31/2011   Procedure: TOTAL KNEE ARTHROPLASTY;  Surgeon: Loreta Ave, MD;  Location: [facility] OR;  Service: Orthopedics;  Laterality: Right;  with revision stem   UPPER GASTROINTESTINAL ENDOSCOPY     Social History   Occupational History    Comment: disability  Tobacco Use   Smoking status: Every Day    Current packs/day: 0.50    Average packs/day: 0.5 packs/day for 4.0 years (2.0 ttl pk-yrs)    Types: Cigarettes   Smokeless tobacco: Never  Vaping Use   Vaping status: Never Used  Substance and Sexual Activity   Alcohol use: Yes    Comment: Occ   Drug use: Never    Comment: hx marijuana use- none recently states on 10/30/2018   Sexual activity: Yes

## 2023-11-12 ENCOUNTER — Encounter: Payer: Self-pay | Admitting: Radiology
